# Patient Record
Sex: Male | Born: 1970
Health system: Southern US, Community
[De-identification: ages and names within clinical notes are randomized; demographics above are authoritative.]

## PROBLEM LIST (undated history)

## (undated) ENCOUNTER — Emergency Department (HOSPITAL_COMMUNITY): Admission: EM | Source: Home / Self Care

## (undated) DIAGNOSIS — G8194 Hemiplegia, unspecified affecting left nondominant side: Secondary | ICD-10-CM

## (undated) DIAGNOSIS — F329 Major depressive disorder, single episode, unspecified: Secondary | ICD-10-CM

## (undated) DIAGNOSIS — G2581 Restless legs syndrome: Secondary | ICD-10-CM

## (undated) DIAGNOSIS — F319 Bipolar disorder, unspecified: Secondary | ICD-10-CM

## (undated) DIAGNOSIS — D649 Anemia, unspecified: Secondary | ICD-10-CM

## (undated) DIAGNOSIS — F419 Anxiety disorder, unspecified: Secondary | ICD-10-CM

## (undated) DIAGNOSIS — F39 Unspecified mood [affective] disorder: Secondary | ICD-10-CM

## (undated) DIAGNOSIS — F32A Depression, unspecified: Secondary | ICD-10-CM

## (undated) DIAGNOSIS — G4730 Sleep apnea, unspecified: Secondary | ICD-10-CM

## (undated) DIAGNOSIS — E785 Hyperlipidemia, unspecified: Secondary | ICD-10-CM

## (undated) DIAGNOSIS — G8929 Other chronic pain: Secondary | ICD-10-CM

## (undated) DIAGNOSIS — I639 Cerebral infarction, unspecified: Secondary | ICD-10-CM

## (undated) DIAGNOSIS — F609 Personality disorder, unspecified: Secondary | ICD-10-CM

## (undated) DIAGNOSIS — M549 Dorsalgia, unspecified: Secondary | ICD-10-CM

## (undated) HISTORY — PX: BACK SURGERY: SHX140

---

## 2008-12-20 ENCOUNTER — Emergency Department: Payer: Self-pay | Admitting: Emergency Medicine

## 2010-05-14 ENCOUNTER — Emergency Department: Payer: Self-pay | Admitting: Emergency Medicine

## 2010-05-20 ENCOUNTER — Emergency Department: Payer: Self-pay | Admitting: Unknown Physician Specialty

## 2010-07-13 ENCOUNTER — Emergency Department: Payer: Self-pay | Admitting: Emergency Medicine

## 2010-07-14 ENCOUNTER — Emergency Department: Payer: Self-pay | Admitting: Unknown Physician Specialty

## 2010-09-14 ENCOUNTER — Encounter
Admission: RE | Admit: 2010-09-14 | Discharge: 2010-09-14 | Payer: Self-pay | Source: Home / Self Care | Attending: Physical Medicine & Rehabilitation | Admitting: Physical Medicine & Rehabilitation

## 2010-09-21 ENCOUNTER — Encounter: Payer: Self-pay | Admitting: Family Medicine

## 2010-09-25 ENCOUNTER — Encounter: Payer: Self-pay | Admitting: Family Medicine

## 2010-10-20 ENCOUNTER — Ambulatory Visit: Payer: Self-pay | Admitting: Family Medicine

## 2010-12-02 ENCOUNTER — Emergency Department: Payer: Self-pay | Admitting: Emergency Medicine

## 2010-12-15 ENCOUNTER — Emergency Department: Payer: Self-pay | Admitting: Emergency Medicine

## 2010-12-17 ENCOUNTER — Emergency Department: Payer: Self-pay | Admitting: Emergency Medicine

## 2010-12-21 ENCOUNTER — Emergency Department: Payer: Self-pay | Admitting: Emergency Medicine

## 2010-12-28 ENCOUNTER — Encounter: Payer: Self-pay | Admitting: Family Medicine

## 2010-12-28 ENCOUNTER — Ambulatory Visit
Admission: RE | Admit: 2010-12-28 | Discharge: 2010-12-28 | Payer: Self-pay | Source: Home / Self Care | Attending: Family Medicine | Admitting: Family Medicine

## 2010-12-28 ENCOUNTER — Emergency Department: Payer: Self-pay | Admitting: Emergency Medicine

## 2010-12-28 DIAGNOSIS — M545 Low back pain, unspecified: Secondary | ICD-10-CM | POA: Insufficient documentation

## 2010-12-28 DIAGNOSIS — N39498 Other specified urinary incontinence: Secondary | ICD-10-CM | POA: Insufficient documentation

## 2010-12-28 DIAGNOSIS — G8929 Other chronic pain: Secondary | ICD-10-CM | POA: Insufficient documentation

## 2010-12-28 DIAGNOSIS — I10 Essential (primary) hypertension: Secondary | ICD-10-CM | POA: Insufficient documentation

## 2011-01-01 ENCOUNTER — Encounter: Payer: Self-pay | Admitting: Family Medicine

## 2011-01-01 ENCOUNTER — Ambulatory Visit
Admission: RE | Admit: 2011-01-01 | Discharge: 2011-01-01 | Payer: Self-pay | Source: Home / Self Care | Attending: Family Medicine | Admitting: Family Medicine

## 2011-01-01 DIAGNOSIS — T887XXA Unspecified adverse effect of drug or medicament, initial encounter: Secondary | ICD-10-CM | POA: Insufficient documentation

## 2011-01-02 ENCOUNTER — Encounter: Payer: Self-pay | Admitting: Family Medicine

## 2011-01-07 ENCOUNTER — Ambulatory Visit
Admission: RE | Admit: 2011-01-07 | Discharge: 2011-01-07 | Payer: Self-pay | Source: Home / Self Care | Attending: Internal Medicine | Admitting: Internal Medicine

## 2011-01-07 DIAGNOSIS — M549 Dorsalgia, unspecified: Secondary | ICD-10-CM | POA: Insufficient documentation

## 2011-01-12 ENCOUNTER — Telehealth: Payer: Self-pay | Admitting: Family Medicine

## 2011-01-12 ENCOUNTER — Ambulatory Visit
Admission: RE | Admit: 2011-01-12 | Discharge: 2011-01-12 | Payer: Self-pay | Source: Home / Self Care | Attending: Family Medicine | Admitting: Family Medicine

## 2011-01-17 ENCOUNTER — Emergency Department: Payer: Self-pay | Admitting: Emergency Medicine

## 2011-01-27 ENCOUNTER — Emergency Department: Payer: Self-pay | Admitting: Emergency Medicine

## 2011-01-27 NOTE — Progress Notes (Signed)
Summary: Emergency Medical records  Emergency Medical records   Imported By: Dorna Leitz 01/02/2011 16:46:44  _____________________________________________________________________  External Attachment:    Type:   Image     Comment:   External Document

## 2011-01-27 NOTE — Assessment & Plan Note (Signed)
Summary: BACK PAIN/JBB   Vital Signs:  Patient profile:   40 year old male Height:      68 inches Weight:      208 pounds BMI:     31.74 O2 Sat:      97 % on Room air Temp:     98.2 degrees F oral Pulse rate:   105 / minute Pulse rhythm:   regular Resp:     20 per minute BP sitting:   125 / 87  (right arm)  Vitals Entered By: Levonne Spiller EMT-P (January 07, 2011 3:49 PM) CC: Lower Back Pain persisting since last visit last week Is Patient Diabetic? No Pain Assessment Patient in pain? yes     Location: lower back Intensity: 6 Type: aching Onset of pain  Gradual  Does patient need assistance? Functional Status Self care Ambulation Normal Comments Pt. is a smoker. 1 half pack per day.   CC:  Lower Back Pain persisting since last visit last week.  History of Present Illness: patient can't read and history form is completed by office staff. patient has been taking double doses of vicodin 7.5/750 and has 14 pills left from 40 prescribed last week. old chart reviewed inc MRI of 12/28/10 which showed neg acute abnl since 10/20/10 which showed post op changes of L5/s1 fusion and no disk bulging or stenosis. patient has appt with ortho on 1/18. patient's pain is unchanged and is left lumbar which occas radiates in sciatic dist. He is helped by laying in left fetal position. His leg occas gives out but is more of a pain release situation. Overall he is no worse but is getting frustrated by lack of good pain control. patient reports either jitters or stomach discomfort from asa or ibuprofen and thinks he took some arthritis med without problem but couldn't remember the name. patient denies ulcers or anaphylaxix  Problems Prior to Update: 1)  Adverse Drug Reaction  (ICD-995.20) 2)  Low Back Pain Syndrome, Severe  (ICD-724.2) 3)  Unspecified Essential Hypertension  (ICD-401.9) 4)  Other Urinary Incontinence  (ICD-788.39)  Medications Prior to Update: 1)  Vicodin 5-500 Mg Tabs  (Hydrocodone-Acetaminophen) .[name].. 1 Tab As Needed For Pain 2)  Lisinopril 40 Mg Tabs (Lisinopril) .[name].. 1 Tab Per Day 3)  Lyrica 200 Mg Caps (Pregabalin) .[name].. 1 Tab Twice Per Day 4)  Vicodin Es 7.5-750 Mg Tabs (Hydrocodone-Acetaminophen) .... Take 1 By Mouth Every 6 Hours As Needed Severe Lower Back Pain.  Caution Will Cause Drowsiness  Allergies: 1)  ! Ibuprofen 2)  ! * Shell Fish 3)  ! * Asprin  Past History:  Past Medical History: Last updated: 12/28/2010 Chronic Low Back Pain - on disability Hypertension Chronic Active Nicotine Dependence  Past Surgical History: Last updated: 12/28/2010 Pt reports 2  prior lower back surgeries "shaved disc" procedures 06 and 07  Social History: Last updated: 12/28/2010 Pt married, currently undergoing a divorce; lives in [place]; smokes 1/2 ppd cigs and has smoked for 6 years. Denies ETOH use and Recreational Drug use.  Review of Systems General:  Denies chills, fatigue, fever, loss of appetite, malaise, sleep disorder, sweats, weakness, and weight loss. Eyes:  Denies blurring, discharge, double vision, eye irritation, eye pain, halos, itching, light sensitivity, red eye, vision loss-1 eye, and vision loss-both eyes. ENT:  Denies decreased hearing, difficulty swallowing, ear discharge, earache, hoarseness, nasal congestion, nosebleeds, postnasal drainage, ringing in ears, sinus pressure, and sore throat. CV:  Denies bluish discoloration of lips or nails, chest  pain or discomfort, difficulty breathing at night, difficulty breathing while lying down, fainting, fatigue, leg cramps with exertion, lightheadness, near fainting, palpitations, shortness of breath with exertion, swelling of feet, swelling of hands, and weight gain. Resp:  Denies chest discomfort, chest pain with inspiration, cough, coughing up blood, excessive snoring, hypersomnolence, morning headaches, pleuritic, shortness of breath, sputum productive, and wheezing. GI:  Denies abdominal  pain, bloody stools, change in bowel habits, constipation, dark tarry stools, diarrhea, excessive appetite, gas, hemorrhoids, indigestion, loss of appetite, nausea, vomiting, vomiting blood, and yellowish skin color. GU:  Denies decreased libido, discharge, dysuria, erectile dysfunction, genital sores, hematuria, incontinence, nocturia, urinary frequency, and urinary hesitancy. MS:  Complains of joint pain and low back pain; denies joint redness, joint swelling, loss of strength, mid back pain, muscle aches, muscle , cramps, muscle weakness, stiffness, and thoracic pain; Lower back. Pt. has an appt with [facility] on 01-12-2011.[name] Derm:  Denies changes in color of skin, changes in nail beds, dryness, excessive perspiration, flushing, hair loss, insect bite(s), itching, lesion(s), poor wound healing, and rash. Neuro:  Denies brief paralysis, difficulty with concentration, disturbances in coordination, falling down, headaches, inability to speak, memory loss, numbness, poor balance, seizures, sensation of room spinning, tingling, tremors, visual disturbances, and weakness. Psych:  Denies alternate hallucination ( auditory/visual), anxiety, depression, easily angered, easily tearful, irritability, mental problems, panic attacks, sense of great danger, suicidal thoughts/plans, thoughts of violence, unusual visions or sounds, and thoughts /plans of harming others. Endo:  Denies cold intolerance, excessive hunger, excessive thirst, excessive urination, heat intolerance, polyuria, and weight change. Heme:  Denies abnormal bruising, bleeding, enlarge lymph nodes, fevers, pallor, and skin discoloration. Allergy:  Complains of seasonal allergies; denies hives or rash, itching eyes, persistent infections, and sneezing.  Physical Exam  General:  Well-developed,well-nourished,in no acute distress; alert,appropriate and cooperative throughout examination Abdomen:  abdomen soft and non-tender. Msk:  lumbar surg  scar. decreased ROM and SI joint tenderness.   Extremities:  tender left medial knee. from. Neurologic:   Station and gait are normal. Plantar reflexes are down-going bilaterally. DTRs are symmetrical kj and right aj and absent left aj. pt reports that this is chronic. Sensory, motor and coordinative functions appear intact. Skin:  Intact without suspicious rashes Psych:  depressed affect and slightly anxious.     Problems:  Medical Problems Added: 1)  Dx of Back Pain  (ICD-724.5)  Complete Medication List: 1)  Vicodin 5-500 Mg Tabs (Hydrocodone-acetaminophen) .[name].. 1 tab as needed for pain 2)  Lisinopril 40 Mg Tabs (Lisinopril) .[name].. 1 tab per day 3)  Lyrica 200 Mg Caps (Pregabalin) .[name].. 1 tab twice per day 4)  Vicodin Es 7.5-750 Mg Tabs (Hydrocodone-acetaminophen) .... Take 1 by mouth every 6 hours as needed severe lower back pain.  caution will cause drowsiness 5)  Mobic 15 Mg Tabs (Meloxicam) .[name].. 1 by mouth once daily with food  Patient Instructions: 1)  watch for gi side effects of mobic and do not take more than 1 per day. 2)  reduce narcotics to 1 pill up to 5 times daily. 3)   warned of tylenol risks on liver. 4)  rest in comfort position as much as possible. 5)  keep appt on 1/18 and accept referral to pain specialist if offered. 6)  go to ed if sudden persistent weakness or numbness in legs. Prescriptions: MOBIC 15 MG TABS (MELOXICAM) 1 by mouth once daily with food  #30 x 0   Entered and Authorized by:   J.  Juline Patch MD   Signed by:   Shela Commons. Juline Patch MD on 01/07/2011   Method used:   Electronically to        [facility]  [address] (retail)       [address], [address]       [place], [place]  [address]       Ph: [phone]       Fax: [phone]   RxID:   [id number] VICODIN ES 7.5-750 MG TABS (HYDROCODONE-ACETAMINOPHEN) take 1 by mouth every 6 hours as needed severe lower back pain.  Caution will cause drowsiness  #15 x 0    Entered and Authorized by:   J. Juline Patch MD   Signed by:   Shela Commons. Juline Patch MD on 01/07/2011   Method used:   Print then Give to Patient   RxID:   [id number]

## 2011-01-27 NOTE — Assessment & Plan Note (Signed)
Summary: wants script for pain med for back pain/jbb   History of Present Illness: CLINICAL NURSE MANAGER ENCOUNTER DOCUMENTATION  Pt. arrived at the clinic reporting that he was going out of town and needed new vicodin refills.  He had just been given Vicodin refills from this office last week.  Pt. had been seen in this clinic prior 3 other visits. The patient said that his orthopedist sent him here to get his Vicodin prescriptions here.   Pt. was given an appt. with [facility] for consult and management of his lower back pain. We called to [facility] to get records of the visit and learned that the patient was not told to come here for pain management.  We learned, in fact, that the patient was told clearly at that visit that he was best served by taking the steroid dosepak prescribed and following up with neurosurgery and also a pain management referral had been arranged for him.  They actually said that they felt that the patient was not following their instructions by coming here to the clinic today asking for pain medications when they had told him not to do so.  The pt. was advised that we can no longer refill narcotic, controlled pain medications because he had been untruthful to [place] and not following the specific clear directions of his orthopedist.  We told him that he would need to follow up with his orthopedist and pain management specialists like they had advised him.  The patient verbalized understanding and then walked out of the clinic without further evaluation. The clinic physician was informed. / rwt  Allergies: 1)  ! Ibuprofen 2)  ! * Shell Fish 3)  ! * Asprin   Complete Medication List: 1)  Vicodin 5-500 Mg Tabs (Hydrocodone-acetaminophen) .[name].. 1 tab as needed for pain 2)  Lisinopril 40 Mg Tabs (Lisinopril) .[name].. 1 tab per day 3)  Lyrica 200 Mg Caps (Pregabalin) .[name].. 1 tab twice per day 4)  Mobic 15 Mg Tabs (Meloxicam) .[name].. 1 by mouth once daily with food

## 2011-01-27 NOTE — Letter (Signed)
Summary: Medical release form  Medical release form   Imported By: Dorna Leitz 01/02/2011 16:24:59  _____________________________________________________________________  External Attachment:    Type:   Image     Comment:   External Document

## 2011-01-27 NOTE — Progress Notes (Signed)
  Phone Note From Other Clinic   Caller: Provider Call For: Rodney Langton, MD CDE FAAFP Request: Talk with Provider Action Taken: Phone Call Completed Summary of Call: I spoke with the patient's orthopedics physician from [facility] and he told me that he had not told the patient to come back to this office for more pain prescriptions. In fact, he did the opposite.  He had told the patient that he would be best served by treating inflammation aggressively with a steroid taper pack and he had arranged for the patient to be evaluated by a neurosurgeon and a pain management specialist.  I got the impression from the physician that he had told the patient that he was not best served by taking chronic controlled narcotics and should have an evauation by a neurosurgeon and then having a complete pain management evaluation by the specialty team.  He said that he believed that the patient was exhibiting drug seeking behaviors and had lied to [place] by telling us that he had sent the patient here for more pain medications.  Rodney Langton, MD, CDE, FAAFP  Initial call taken by: Jonetta Speak    Addendum:  The patient walked out of the clinic when we informed him that we had inquired about his orthopedic appointment and wanted to get more details of the circumstances surrounding that office visit and why he had presented back here so soon asking for more controlled pain medication prescriptions.

## 2011-01-27 NOTE — Assessment & Plan Note (Signed)
Summary: real bad back pain, leg locking up/jbb   Vital Signs:  Patient Profile:   40 Years Old Male CC:      Lower Back Pain Height:     68 inches Weight:      205 pounds BMI:     31.28 O2 Sat:      96 % O2 treatment:    Room Air Temp:     97.7 degrees F oral Pulse rate:   85 / minute Pulse rhythm:   regular Resp:     20 per minute BP sitting:   143 / 97  (right arm)  Pt. in pain?   yes    Location:   back    Intensity:   10    Type:       aching  Vitals Entered By: Levonne Spiller EMT-P (December 28, 2010 11:19 AM)              Is Patient Diabetic? No  Does patient need assistance? Functional Status Self care Ambulation Normal Comments Pt. is a smoker. Half pack per day      Current Allergies: ! IBUPROFEN ! * SHELL FISH ! * ASPRINHistory of Present Illness History from: patient Reason for visit: medication refills Chief Complaint: Lower Back Pain History of Present Illness: The patient is presenting today because he is having worsening lower back pain and now for the past week has started to develop urinary incontinence and loss of bladder control.  He is also having some difficulty with bowels.  He says that he is visiting this area to be with his parents while going through a divorce.  He says that he has a long history of chronic lower back problems and has had multiple surgeries on his lower back and he is reporting that he has not been able to see his neurosurgeon in the past 3 years.  His last back surgery was in 2007 and last saw the neurosurgeon in 2008.  He says that he is having difficulty walking and he is reporting that his lower back pain is 10/10.  He says that he is out of all of his medications including the chronic pain medication that he takes and his blood pressure medications.  He says that he otherwise has been miserable and suffering.  He says that his shorts are constantly wet because he is dribbling urine all day long.  He has also been falling down  frequently and reports that he had a severe fall in November of 2011.    REVIEW OF SYSTEMS Constitutional Symptoms       Complains of chills.     Denies fever, night sweats, weight loss, weight gain, and fatigue.  Eyes       Denies change in vision, eye pain, eye discharge, glasses, contact lenses, and eye surgery. Ear/Nose/Throat/Mouth       Denies hearing loss/aids, change in hearing, ear pain, ear discharge, dizziness, frequent runny nose, frequent nose bleeds, sinus problems, sore throat, hoarseness, and tooth pain or bleeding.  Respiratory       Denies dry cough, productive cough, wheezing, shortness of breath, asthma, bronchitis, and emphysema/COPD.  Cardiovascular       Denies murmurs, chest pain, and tires easily with exhertion.    Gastrointestinal       Denies stomach pain, nausea/vomiting, diarrhea, constipation, blood in bowel movements, and indigestion. Genitourniary       Complains of loss of urinary control.      Denies painful  urination and kidney stones.      Comments: in the past week per pt. Neurological       Complains of headaches.      Denies paralysis, seizures, and fainting/blackouts. Musculoskeletal       Complains of muscle pain, joint pain, and joint stiffness.      Denies decreased range of motion, redness, swelling, muscle weakness, and gout.  Skin       Denies bruising, unusual mles/lumps or sores, and hair/skin or nail changes.  Psych       Denies mood changes, temper/anger issues, anxiety/stress, speech problems, depression, and sleep problems.  Past History:  Family History: Last updated: 12/28/2010 Parents reported as healthy by patient  Social History: Last updated: 12/28/2010 Pt married, currently undergoing a divorce; lives in [place]; smokes 1/2 ppd cigs and has smoked for 6 years. Denies ETOH use and Recreational Drug use.  Past Medical History: Chronic Low Back Pain - on disability Hypertension Chronic Active Nicotine  Dependence  Past Surgical History: Pt reports 2  prior lower back surgeries "shaved disc" procedures 06 and 07  Family History: Parents reported as healthy by patient  Social History: Pt married, currently undergoing a divorce; lives in [place]; smokes 1/2 ppd cigs and has smoked for 6 years. Denies ETOH use and Recreational Drug use. Physical Exam General appearance: well developed, well nourished, no acute distress Head: normocephalic, atraumatic Eyes: conjunctivae and lids normal Pupils: equal, round, reactive to light Ears: normal, no lesions or deformities Nasal: mucosa pink, nonedematous, no septal deviation, turbinates normal Oral/Pharynx: tongue normal, posterior pharynx without erythema or exudate Neck: neck supple,  trachea midline, no masses Chest/Lungs: no rales, wheezes, or rhonchi bilateral, breath sounds equal without effort Heart: regular rate and rhythm, no murmur Abdomen: soft, non-tender without obvious organomegaly GU: pt dribbling urine Extremities: normal extremities Neurological: grossly intact and non-focal Back: tender musculature bilateral lower back, straight leg raises positive bilaterally, deep tendon reflexes  0/4 on left, 2/4 right at achilles and patella Skin: no obvious rashes or lesions MSE: oriented to time, place, and person Assessment New Problems: LOW BACK PAIN SYNDROME, SEVERE (ICD-724.2) UNSPECIFIED ESSENTIAL HYPERTENSION (ICD-401.9) OTHER URINARY INCONTINENCE (ICD-788.39)   Patient Education: Patient and/or caregiver instructed in the following: rest, quit smoking. The risks, benefits and possible side effects were clearly explained and discussed with the patient.  The patient verbalized clear understanding.  The patient was given instructions to return if symptoms don't improve, worsen or new changes develop.  If it is not during clinic hours and the patient cannot get back to this clinic then the patient was told to seek medical care  at an available urgent care or emergency department.  The patient verbalized understanding.   Demonstrates willingness to comply.  Plan Planning Comments:   Because the patient is having an acute neurological change to his condition, and he is reporting 10/10 pain in the L-S spine, I recommended that he go to the ER for evaluation and treatment.  We called EMS to come and pick the patient up and to take him to [facility] for an evaluation.  I also spoke with the patient's mother on the telephone and updated her on his condition and that we were sending him to the ER for evaluation. She verbalized understanding.  Follow Up: Follow up on an as needed basis, Follow up with Primary Physician  The patient and/or caregiver has been counseled thoroughly with regard to medications prescribed including dosage, schedule, interactions, rationale for use,  and possible side effects and they verbalize understanding.  Diagnoses and expected course of recovery discussed and will return if not improved as expected or if the condition worsens. Patient and/or caregiver verbalized understanding.   Patient Instructions: 1)  The patient was sent to the ER for evaluation and treatment of acute on chronic low back pain syndrome with acute neurological deterioration (given his urinary incontinence in the last week).

## 2011-01-27 NOTE — Assessment & Plan Note (Signed)
Summary: BACK PAIN/EVM   Vital Signs:  Patient profile:   40 year old male Height:      68 inches Weight:      208 pounds BMI:     31.74 O2 Sat:      95 % on Room air Temp:     98.2 degrees F Pulse rate:   85 / minute BP sitting:   143 / 99  (left arm) Cuff size:   regular  Vitals Entered By: Haze Boyden, CMA (January 01, 2011 2:42 PM)  O2 Flow:  Room air History of Present Illness History from: patient Reason for visit: see chief complaint Chief Complaint: reaction to percocet History of Present Illness: The patient presented today as a follow up from the ER. He was given a prescription for percocet.  He said that it caused him to have itching and a rash.  Pt says that he became sick to his stomach.  He denies n/v.  No chest pain, no SOB.  Pt says that he would like to switch to his vicodin.  The patient says that his symptoms of urinary incontinence have persisted.  The patient is reporting that he is doing a lot better.  He says that he is taking his lyrica and lisinopril medication.  I reviewed the patients ER records from 12/28/10 and reviewed the MRI results.      Allergies: 1)  ! Ibuprofen 2)  ! * Shell Fish 3)  ! * Asprin  Past History:  Family History: Last updated: 12/28/2010 Parents reported as healthy by patient  Social History: Last updated: 12/28/2010 Pt married, currently undergoing a divorce; lives in [place]; smokes 1/2 ppd cigs and has smoked for 6 years. Denies ETOH use and Recreational Drug use.  Past Medical History: Reviewed history from 12/28/2010 and no changes required. Chronic Low Back Pain - on disability Hypertension Chronic Active Nicotine Dependence  Past Surgical History: Reviewed history from 12/28/2010 and no changes required. Pt reports 2  prior lower back surgeries "shaved disc" procedures 06 and 07  Family History: Reviewed history from 12/28/2010 and no changes required. Parents reported as healthy by patient  Social  History: Reviewed history from 12/28/2010 and no changes required. Pt married, currently undergoing a divorce; lives in [place]; smokes 1/2 ppd cigs and has smoked for 6 years. Denies ETOH use and Recreational Drug use.  Review of Systems      See HPI General:  Denies chills, fatigue, fever, loss of appetite, malaise, sleep disorder, sweats, weakness, and weight loss. Eyes:  Denies blurring, discharge, double vision, eye irritation, eye pain, halos, itching, light sensitivity, red eye, vision loss-1 eye, and vision loss-both eyes. ENT:  Complains of nasal congestion; denies nosebleeds and postnasal drainage. CV:  Denies bluish discoloration of lips or nails, chest pain or discomfort, difficulty breathing at night, difficulty breathing while lying down, fainting, fatigue, leg cramps with exertion, lightheadness, near fainting, palpitations, shortness of breath with exertion, swelling of feet, swelling of hands, and weight gain. Resp:  Denies chest discomfort, chest pain with inspiration, cough, coughing up blood, excessive snoring, hypersomnolence, morning headaches, pleuritic, shortness of breath, sputum productive, and wheezing. GI:  Complains of change in bowel habits and constipation. MS:  Complains of low back pain, mid back pain, muscle weakness, stiffness, and thoracic pain; denies joint pain, joint redness, joint swelling, loss of strength, muscle aches, muscle , and cramps. Neuro:  Complains of numbness, tingling, and weakness. Psych:  Denies alternate hallucination ( auditory/visual), anxiety,  depression, easily angered, easily tearful, irritability, mental problems, panic attacks, sense of great danger, suicidal thoughts/plans, thoughts of violence, unusual visions or sounds, and thoughts /plans of harming others.  Physical Exam  General:  Well-developed,well-nourished,in no acute distress; alert,appropriate and cooperative throughout examination Head:  Normocephalic and atraumatic  without obvious abnormalities. No apparent alopecia or balding. Eyes:  No corneal or conjunctival inflammation noted. EOMI. Perrla. Funduscopic exam benign, without hemorrhages, exudates or papilledema. Vision grossly normal. Ears:  External ear exam shows no significant lesions or deformities.  Otoscopic examination reveals clear canals, tympanic membranes are intact bilaterally without bulging, retraction, inflammation or discharge. Hearing is grossly normal bilaterally. Nose:  bilateral swollen nasal turbinates Mouth:  Oral mucosa and oropharynx without lesions or exudates.  Teeth in good repair. Neck:  No deformities, masses, or tenderness noted. Lungs:  Normal respiratory effort, chest expands symmetrically. Lungs are clear to auscultation, no crackles or wheezes. Heart:  Normal rate and regular rhythm. S1 and S2 normal without gallop, murmur, click, rub or other extra sounds. Msk:  tenderness in the lower lumbar spine and paraspinal muscles in spasm Pulses:  R and L carotid,radial,femoral,dorsalis pedis and posterior tibial pulses are full and equal bilaterally Neurologic:  RLE hyporeflexia and LLE hyporeflexia.     Impression & Recommendations:  Problem # 1:  ADVERSE DRUG REACTION (ICD-995.20) Pt told to discontinue the percocet tablets and I will give him an Rx for vicodin ES, #40, NRF.   The patient has an appointment arranged with a spinal specialist on 1/18.  He was advised to go to that appointment.  Avoid ETOH.  I explained to him that he would need to find a pain management specialist and primary care provider ASAP.  The patient verbalized clear understanding.    Problem # 2:  UNSPECIFIED ESSENTIAL HYPERTENSION (ICD-401.9)  His updated medication list for this problem includes:    Lisinopril 40 Mg Tabs (Lisinopril) .[name]... 1 tab per day  Problem # 3:  LOW BACK PAIN SYNDROME, SEVERE (ICD-724.2)  His updated medication list for this problem includes:    Vicodin Es 7.5-750 Mg Tabs  (Hydrocodone-acetaminophen) .[name]... Take 1 by mouth every 6 hours as needed severe lower back pain.  caution will cause drowsiness  Complete Medication List: 1)  Vicodin 5-500 Mg Tabs (Hydrocodone-acetaminophen) .[name].. 1 tab as needed for pain 2)  Lisinopril 40 Mg Tabs (Lisinopril) .[name].. 1 tab per day 3)  Lyrica 200 Mg Caps (Pregabalin) .[name].. 1 tab twice per day 4)  Vicodin Es 7.5-750 Mg Tabs (Hydrocodone-acetaminophen) .... Take 1 by mouth every 6 hours as needed severe lower back pain.  caution will cause drowsiness  Patient Instructions: 1)  Go to the pharmacy and pick up your prescription (s).  It may take up to 30 mins for electronic prescriptions to be delivered to the pharmacy.  Please call if your pharmacy has not received your prescriptions after 30 minutes.   2)  Be careful and try not to fall down.  3)  Don't miss your appointment with the spinal specialist. 4)  Check your Blood Pressure regularly. If it is above: 140/90 you should make an appointment. 5)  The patient was informed that there is no on-call provider or services available at this clinic during off-hours (when the clinic is closed).  If the patient developed a problem or concern that required immediate attention, the patient was advised to go the the nearest available urgent care or emergency department for medical care.  The patient verbalized understanding.  6)  Eat a high fiber diet to avoid constipation.  7)  We will need to get your records from [place].  Prescriptions: VICODIN ES 7.5-750 MG TABS (HYDROCODONE-ACETAMINOPHEN) take 1 by mouth every 6 hours as needed severe lower back pain.  Caution will cause drowsiness  #40 x 0   Entered and Authorized by:   Standley Dakins MD   Signed by:   Standley Dakins MD on 01/01/2011   Method used:   Print then Give to Patient   RxID:   [id number]

## 2011-02-01 ENCOUNTER — Emergency Department: Payer: Self-pay | Admitting: Emergency Medicine

## 2011-02-14 ENCOUNTER — Emergency Department: Payer: Self-pay | Admitting: Unknown Physician Specialty

## 2011-02-16 ENCOUNTER — Emergency Department: Payer: Self-pay | Admitting: Unknown Physician Specialty

## 2011-03-22 ENCOUNTER — Emergency Department: Payer: Self-pay | Admitting: Emergency Medicine

## 2011-04-01 ENCOUNTER — Emergency Department: Payer: Self-pay | Admitting: Emergency Medicine

## 2011-04-15 ENCOUNTER — Ambulatory Visit: Payer: Self-pay | Admitting: Family Medicine

## 2011-04-19 ENCOUNTER — Ambulatory Visit: Payer: Self-pay | Admitting: Emergency Medicine

## 2011-05-06 ENCOUNTER — Emergency Department: Payer: Self-pay | Admitting: Emergency Medicine

## 2011-05-09 ENCOUNTER — Emergency Department: Payer: Self-pay | Admitting: Emergency Medicine

## 2011-05-18 ENCOUNTER — Emergency Department: Payer: Self-pay | Admitting: *Deleted

## 2011-05-18 ENCOUNTER — Emergency Department: Payer: Self-pay | Admitting: Emergency Medicine

## 2011-05-24 ENCOUNTER — Emergency Department: Payer: Self-pay | Admitting: Emergency Medicine

## 2011-06-18 ENCOUNTER — Emergency Department: Payer: Self-pay | Admitting: Internal Medicine

## 2011-06-22 ENCOUNTER — Emergency Department: Payer: Self-pay | Admitting: Unknown Physician Specialty

## 2011-07-04 ENCOUNTER — Emergency Department: Payer: Self-pay | Admitting: Emergency Medicine

## 2011-07-10 ENCOUNTER — Emergency Department: Payer: Self-pay | Admitting: Emergency Medicine

## 2011-07-11 ENCOUNTER — Emergency Department: Payer: Self-pay | Admitting: Emergency Medicine

## 2011-07-21 ENCOUNTER — Emergency Department: Payer: Self-pay | Admitting: Emergency Medicine

## 2011-07-26 ENCOUNTER — Emergency Department: Payer: Self-pay | Admitting: Internal Medicine

## 2011-08-10 ENCOUNTER — Emergency Department: Payer: Self-pay | Admitting: *Deleted

## 2011-09-05 ENCOUNTER — Emergency Department: Payer: Self-pay | Admitting: Emergency Medicine

## 2011-09-23 ENCOUNTER — Emergency Department: Payer: Self-pay | Admitting: Emergency Medicine

## 2011-09-24 ENCOUNTER — Emergency Department: Payer: Self-pay | Admitting: *Deleted

## 2011-10-01 ENCOUNTER — Emergency Department: Payer: Self-pay | Admitting: Unknown Physician Specialty

## 2011-10-05 ENCOUNTER — Emergency Department: Payer: Self-pay | Admitting: Internal Medicine

## 2011-10-09 ENCOUNTER — Emergency Department: Payer: Self-pay | Admitting: Emergency Medicine

## 2011-10-13 ENCOUNTER — Emergency Department (HOSPITAL_COMMUNITY)
Admission: EM | Admit: 2011-10-13 | Discharge: 2011-10-13 | Discharge status: Against Medical Advice | Payer: Self-pay | Attending: Emergency Medicine | Admitting: Emergency Medicine

## 2011-10-21 ENCOUNTER — Emergency Department (HOSPITAL_COMMUNITY)
Admission: EM | Admit: 2011-10-21 | Discharge: 2011-10-21 | Disposition: A | Discharge status: Home / Self Care | Payer: Medicare Other | Attending: Emergency Medicine | Admitting: Emergency Medicine

## 2011-10-21 DIAGNOSIS — I10 Essential (primary) hypertension: Secondary | ICD-10-CM | POA: Insufficient documentation

## 2011-10-21 DIAGNOSIS — G8929 Other chronic pain: Secondary | ICD-10-CM | POA: Insufficient documentation

## 2011-10-21 DIAGNOSIS — M549 Dorsalgia, unspecified: Secondary | ICD-10-CM | POA: Insufficient documentation

## 2011-10-31 ENCOUNTER — Emergency Department (HOSPITAL_COMMUNITY)
Admission: EM | Admit: 2011-10-31 | Discharge: 2011-10-31 | Disposition: A | Discharge status: Home / Self Care | Payer: Medicare Other | Attending: Emergency Medicine | Admitting: Emergency Medicine

## 2011-10-31 ENCOUNTER — Encounter: Payer: Self-pay | Admitting: Emergency Medicine

## 2011-10-31 DIAGNOSIS — G8929 Other chronic pain: Secondary | ICD-10-CM

## 2011-10-31 DIAGNOSIS — R29898 Other symptoms and signs involving the musculoskeletal system: Secondary | ICD-10-CM | POA: Insufficient documentation

## 2011-10-31 DIAGNOSIS — I10 Essential (primary) hypertension: Secondary | ICD-10-CM | POA: Insufficient documentation

## 2011-10-31 DIAGNOSIS — R209 Unspecified disturbances of skin sensation: Secondary | ICD-10-CM | POA: Insufficient documentation

## 2011-10-31 DIAGNOSIS — M545 Low back pain, unspecified: Secondary | ICD-10-CM | POA: Insufficient documentation

## 2011-10-31 DIAGNOSIS — M79609 Pain in unspecified limb: Secondary | ICD-10-CM | POA: Insufficient documentation

## 2011-10-31 MED ORDER — METHOCARBAMOL 100 MG/ML IJ SOLN: 1000.0000 mg | Freq: Once | INTRAMUSCULAR | Status: DC

## 2011-10-31 MED ORDER — HYDROMORPHONE HCL PF 2 MG/ML IJ SOLN: 2.0000 mg | Freq: Once | INTRAMUSCULAR | Status: DC

## 2011-10-31 MED ORDER — HYDROCODONE-ACETAMINOPHEN 5-325 MG PO TABS: 2.0000 | ORAL_TABLET | ORAL | Status: AC | PRN

## 2011-10-31 MED ORDER — HYDROMORPHONE HCL PF 2 MG/ML IJ SOLN
2.0000 mg | Freq: Once | INTRAMUSCULAR | Status: AC
  Administered 2011-10-31: 2 mg via INTRAMUSCULAR
  Filled 2011-10-31: qty 1

## 2011-10-31 MED ORDER — DIAZEPAM 5 MG/ML IJ SOLN
5.0000 mg | Freq: Once | INTRAMUSCULAR | Status: AC
  Administered 2011-10-31: 5 mg via INTRAMUSCULAR
  Filled 2011-10-31: qty 2

## 2011-10-31 NOTE — ED Notes (Signed)
Pt given D/C instructions; verbalizes understanding.  Pt with c/o pain ranging 6.5-7/10.  No other distress noted.  Pt ambulatory to lobby with significant other.

## 2011-10-31 NOTE — ED Provider Notes (Signed)
History     CSN: [id number] Arrival date & time: 10/31/2011  1:45 PM   First MD Initiated Contact with Patient 10/31/11 1423      Chief Complaint  Patient presents with  . Back Pain    (Consider location/radiation/quality/duration/timing/severity/associated sxs/prior treatment) HPI Comments: Patient has chronic low back pain with radiation into left leg.  Pt has been referred to pain management and has an appointment later this month.  States his chronic back pain is unchanged and feels exactly like his chronic back pain when he runs out of his medication.  In addition, patient was moving furniture yesterday and feels like this made his pain worse.  No change in his left leg symptoms.    Back Pain  This is a chronic problem. The problem occurs constantly. The problem has not changed since onset.The pain is associated with lifting heavy objects (chronic back pain, hx multiple L-spine surgeries). The pain radiates to the left foot. Associated symptoms include numbness, leg pain and weakness. Pertinent negatives include no fever, no abdominal pain, no bowel incontinence, no bladder incontinence, no dysuria and no paresthesias. Associated symptoms comments: Intermittent left leg weakness that is unchanged, pain radiates to left leg, this is unchanged.  No paresthesias of lower extremities.  .  Patient is a 40 y.o. male presenting with back pain.    Past Medical History  Diagnosis Date  . Hypertension     History reviewed. No pertinent past surgical history.  History reviewed. No pertinent family history.  History  Substance Use Topics  . Smoking status: Current Everyday Smoker -- 1.0 packs/day  . Smokeless tobacco: Not on file  . Alcohol Use: No      Review of Systems  Constitutional: Negative for fever.  Gastrointestinal: Negative for abdominal pain and bowel incontinence.  Genitourinary: Negative for bladder incontinence and dysuria.  Musculoskeletal: Positive for back pain.    Neurological: Positive for weakness and numbness. Negative for paresthesias.    Allergies  Aspirin; Ibuprofen; and Shellfish allergy  Home Medications  No current outpatient prescriptions on file.  BP 133/95  Pulse 77  Temp 98.2 F (36.8 C)  Resp 18  SpO2 97%  Physical Exam  Vitals reviewed. Constitutional: He is oriented to person, place, and time. He appears well-developed and well-nourished.  HENT:  Head: Normocephalic and atraumatic.  Neck: Neck supple.  Cardiovascular: Normal rate, regular rhythm and normal heart sounds.   Pulmonary/Chest: Breath sounds normal. No respiratory distress. He has no wheezes. He has no rales. He exhibits no tenderness.  Abdominal: Soft. Bowel sounds are normal. There is no tenderness.  Musculoskeletal:       c-spine, t-spine normal, nontender.  l-spine diffusely tender with no step-offs, localized tenderness, crepitus, or skin changes.   Neurological: He is alert and oriented to person, place, and time. He has normal strength. No sensory deficit.       Strength 5/5 lower extremities, sensation intact, distal pulses intact, no edema.     ED Course  Procedures (including critical care time) 4:28 PM Patient reports no relief of pain.  Lisette Paz, PA-C, assumes care of patient at change of shift.  Plan is for pain control and d/c home dx chronic back pain.  Pt to follow up with pain management.  May also give resource list for PCP follow up.   Labs Reviewed - No data to display No results found.   1. Back pain, chronic       MDM  Patient with chronic  low back pain that he states is unchanged.  Patient was writhing on stretcher, but states this is absolutely normal for him and that "rocking" is what he does when he is in pain.  Patient signed out to [facility] [place], who continued managing pain and dispositioned patient.          Dillard [place], [place] 11/02/11 [phone]

## 2011-10-31 NOTE — ED Notes (Signed)
Pt c/o lower back pain with radiation into hip and down left leg x 3 days; pt sts hx of same; pt sts happened after moving furniture

## 2011-10-31 NOTE — ED Provider Notes (Signed)
5:26 PM  Patient care resumed from [facility]. Patient is here for chronic pain management of lower back problems. Pain has been controlled with Dilaudid. He states he will followup on the 19th with chronic pain management. I will be giving him a prescription for pain killers to last him till that appointment.  [place], [place] 10/31/11 1726

## 2011-11-02 NOTE — ED Provider Notes (Signed)
Medical screening examination/treatment/procedure(s) were performed by non-physician practitioner and as supervising physician I was immediately available for consultation/collaboration.  Hurman Horn, MD 11/02/11 2209

## 2011-11-02 NOTE — ED Provider Notes (Signed)
Medical screening examination/treatment/procedure(s) were performed by non-physician practitioner and as supervising physician I was immediately available for consultation/collaboration.  Hurman Horn, MD 11/02/11 2225

## 2011-11-06 ENCOUNTER — Encounter (HOSPITAL_COMMUNITY): Payer: Self-pay | Admitting: *Deleted

## 2011-11-06 ENCOUNTER — Emergency Department (INDEPENDENT_AMBULATORY_CARE_PROVIDER_SITE_OTHER)
Admission: EM | Admit: 2011-11-06 | Discharge: 2011-11-06 | Disposition: A | Discharge status: Home / Self Care | Payer: Medicare Other | Source: Home / Self Care | Attending: Emergency Medicine | Admitting: Emergency Medicine

## 2011-11-06 DIAGNOSIS — G8929 Other chronic pain: Secondary | ICD-10-CM

## 2011-11-06 DIAGNOSIS — M549 Dorsalgia, unspecified: Secondary | ICD-10-CM

## 2011-11-06 DIAGNOSIS — J00 Acute nasopharyngitis [common cold]: Secondary | ICD-10-CM

## 2011-11-06 HISTORY — DX: Depression, unspecified: F32.A

## 2011-11-06 HISTORY — DX: Other chronic pain: G89.29

## 2011-11-06 HISTORY — DX: Dorsalgia, unspecified: M54.9

## 2011-11-06 HISTORY — DX: Major depressive disorder, single episode, unspecified: F32.9

## 2011-11-06 MED ORDER — LIDOCAINE 5 % EX PTCH: 1.0000 | MEDICATED_PATCH | CUTANEOUS | Status: AC

## 2011-11-06 MED ORDER — MUPIROCIN CALCIUM 2 % NA OINT: TOPICAL_OINTMENT | NASAL | Status: AC

## 2011-11-06 MED ORDER — DOXYCYCLINE HYCLATE 100 MG PO CAPS: 100.0000 mg | ORAL_CAPSULE | Freq: Two times a day (BID) | ORAL | Status: AC

## 2011-11-06 NOTE — ED Provider Notes (Addendum)
History     CSN: [id number] Arrival date & time: 11/06/2011  4:52 PM   First MD Initiated Contact with Patient 11/06/11 1613      Chief Complaint  Patient presents with  . URI  . Back Pain    (Consider location/radiation/quality/duration/timing/severity/associated sxs/prior treatment) HPI Comments: R side of nose its swollen and has been hurting for "severel days" its sore in the inside and now getting swollen on the outside"..had some cough last week with some congestion took mucinex that got better but the nose pain is getting worse"  The history is provided by the patient.    Past Medical History  Diagnosis Date  . Hypertension   . Chronic back pain   . Depression     Past Surgical History  Procedure Date  . Back surgery     History reviewed. No pertinent family history.  History  Substance Use Topics  . Smoking status: Current Everyday Smoker -- 0.5 packs/day    Types: Cigarettes  . Smokeless tobacco: Not on file  . Alcohol Use: No      Review of Systems  Constitutional: Negative.  Negative for fever.    Allergies  Aspirin; Ibuprofen; Iodine; and Shellfish allergy  Home Medications   Current Outpatient Rx  Name Route Sig Dispense Refill  . LISINOPRIL 40 MG PO TABS Oral Take 40 mg by mouth daily.      [name] PREGABALIN 200 MG PO CAPS Oral Take 200 mg by mouth 2 (two) times daily.      [name] ZOLOFT PO Oral Take by mouth.      . DOXYCYCLINE HYCLATE 100 MG PO CAPS Oral Take 1 capsule (100 mg total) by mouth 2 (two) times daily. 20 capsule 0  . HYDROCODONE-ACETAMINOPHEN 5-325 MG PO TABS Oral Take 2 tablets by mouth every 4 (four) hours as needed for pain. 20 tablet 0  . LIDOCAINE 5 % EX PTCH Transdermal Place 1 patch onto the skin daily. Remove & Discard patch within 12 hours or as directed by MD 15 patch 0  . MUPIROCIN CALCIUM 2 % NA OINT  Apply in each nostril daily twice a day for 7 days 1 g 0  . OXYCODONE-ACETAMINOPHEN 5-325 MG PO TABS Oral Take 1 tablet by  mouth every 4 (four) hours as needed. For pain        BP 134/88  Pulse 88  Temp(Src) 98.6 F (37 C) (Oral)  Resp 20  SpO2 97%  Physical Exam  Constitutional: He appears well-developed and well-nourished.  HENT:  Nose: Mucosal edema and sinus tenderness present.      ED Course  Procedures (including critical care time)  Labs Reviewed - No data to display No results found.   1. Acute infective rhinitis   2. BACK PAIN   3. Chronic back pain       MDM  nasal LOCALIZED INFECTION- R nostril and ongoing chronic back pian- without changes-        Jimmie Molly, MD 11/06/11 1927  Jimmie Molly, MD 11/06/11 1930  Jimmie Molly, MD 11/06/11 1932  Jimmie Molly, MD 11/06/11 4098

## 2011-11-06 NOTE — ED Notes (Signed)
C/O cold sxs x 1 wk w/ nasal drainage, swelling to nose, productive cough.  Has been taking Mucinex.  Also c/o chronic back pain - ran out of Percocet 10-325mg  1 wk ago - [facility] get [facility] on [facility].

## 2011-11-07 ENCOUNTER — Telehealth (HOSPITAL_COMMUNITY): Payer: Self-pay | Admitting: *Deleted

## 2011-12-25 ENCOUNTER — Emergency Department (HOSPITAL_COMMUNITY)
Admission: EM | Admit: 2011-12-25 | Discharge: 2011-12-25 | Disposition: A | Discharge status: Home / Self Care | Payer: Medicare Other | Attending: Emergency Medicine | Admitting: Emergency Medicine

## 2011-12-25 ENCOUNTER — Encounter (HOSPITAL_COMMUNITY): Payer: Self-pay | Admitting: *Deleted

## 2011-12-25 DIAGNOSIS — T148XXA Other injury of unspecified body region, initial encounter: Secondary | ICD-10-CM | POA: Insufficient documentation

## 2011-12-25 DIAGNOSIS — M549 Dorsalgia, unspecified: Secondary | ICD-10-CM | POA: Insufficient documentation

## 2011-12-25 DIAGNOSIS — K0889 Other specified disorders of teeth and supporting structures: Secondary | ICD-10-CM

## 2011-12-25 DIAGNOSIS — X500XXA Overexertion from strenuous movement or load, initial encounter: Secondary | ICD-10-CM | POA: Insufficient documentation

## 2011-12-25 DIAGNOSIS — F17200 Nicotine dependence, unspecified, uncomplicated: Secondary | ICD-10-CM | POA: Insufficient documentation

## 2011-12-25 DIAGNOSIS — K089 Disorder of teeth and supporting structures, unspecified: Secondary | ICD-10-CM | POA: Insufficient documentation

## 2011-12-25 DIAGNOSIS — I10 Essential (primary) hypertension: Secondary | ICD-10-CM | POA: Insufficient documentation

## 2011-12-25 DIAGNOSIS — G8929 Other chronic pain: Secondary | ICD-10-CM | POA: Insufficient documentation

## 2011-12-25 MED ORDER — KETOROLAC TROMETHAMINE 60 MG/2ML IM SOLN
60.0000 mg | Freq: Once | INTRAMUSCULAR | Status: AC
  Administered 2011-12-25: 60 mg via INTRAMUSCULAR
  Filled 2011-12-25: qty 2

## 2011-12-25 MED ORDER — OXYCODONE-ACETAMINOPHEN 5-325 MG PO TABS
1.0000 | ORAL_TABLET | Freq: Once | ORAL | Status: AC
  Administered 2011-12-25: 1 via ORAL
  Filled 2011-12-25: qty 1

## 2011-12-25 NOTE — ED Notes (Signed)
Pt c/o lower back pain - states pulled his muscles yesterday.  States had 9 teeth extracted last week and is c/o pain.

## 2011-12-25 NOTE — ED Provider Notes (Signed)
Medical screening examination/treatment/procedure(s) were performed by non-physician practitioner and as supervising physician I was immediately available for consultation/collaboration.  Zofia Peckinpaugh, MD 12/25/11 2215 

## 2011-12-25 NOTE — ED Provider Notes (Signed)
History     CSN: [id number]  Arrival date & time 12/25/11  4098   First MD Initiated Contact with Patient 12/25/11 [phone]      Chief Complaint  Patient presents with  . Back Pain    (Consider location/radiation/quality/duration/timing/severity/associated sxs/prior treatment) HPI Patient with hx chronic back pain who has been seen by multiple primary care providers and ER providers in [place] area in the past for chronic back pain presents to emergency department complaining of acute on chronic back pain stating he was playing with his granddaughter yesterday and felt a pull in his back when lifting her but also complaining of ongoing dental pain associated with having 9 teeth extracted over a week ago. Patient uses a cane at baseline for ambulation and is ambulating with mild pain in back. He denies lower extremity numbness or tingling, loss of bowel or bladder function, or saddle feet paresthesias. Patient states pain is aggravated by bending and movement and improved with rest. Patient denies fevers, chills, facial swelling or difficulty swallowing. He states he was prescribed percocet by his dentist but has run out and has not returned for follow up. Per ER note in November, patient voiced he would be following up with chronic pain management when being seen for acute on chronic back pain at that time but today states he has yet to make follow up because he wants to return to [place] where he is originally from to continue care for back pain there.   Past Medical History  Diagnosis Date  . Hypertension   . Chronic back pain   . Depression     Past Surgical History  Procedure Date  . Back surgery     History reviewed. No pertinent family history.  History  Substance Use Topics  . Smoking status: Current Everyday Smoker -- 0.5 packs/day    Types: Cigarettes  . Smokeless tobacco: Not on file  . Alcohol Use: No      Review of Systems  All other systems reviewed and are  negative.    Allergies  Aspirin; Ibuprofen; Iodine; and Shellfish allergy  Home Medications   Current Outpatient Rx  Name Route Sig Dispense Refill  . LISINOPRIL 40 MG PO TABS Oral Take 40 mg by mouth daily.      . OXYCODONE-ACETAMINOPHEN 5-325 MG PO TABS Oral Take 1 tablet by mouth every 4 (four) hours as needed. For pain      . PREGABALIN 200 MG PO CAPS Oral Take 200 mg by mouth 2 (two) times daily.      . SERTRALINE HCL 100 MG PO TABS Oral Take 100 mg by mouth daily.        BP 150/108  Pulse 70  Temp(Src) 97.5 F (36.4 C) (Oral)  Resp 20  Ht 5\' 6"  (1.676 m)  Wt 190 lb (86.183 kg)  BMI 30.67 kg/m2  SpO2 100%  Physical Exam  Nursing note and vitals reviewed. Constitutional: He is oriented to person, place, and time. He appears well-developed and well-nourished. No distress.  HENT:  Head: Normocephalic and atraumatic.       no edema or erythema of the gingiva status post multiple tooth retraction but no signs or symptoms of infection. Airway patent.  Eyes: Conjunctivae and EOM are normal. Pupils are equal, round, and reactive to light.  Neck: Normal range of motion. Neck supple.  Cardiovascular: Normal rate, regular rhythm, normal heart sounds and intact distal pulses.  Exam reveals no gallop and no friction rub.  No murmur heard. Pulmonary/Chest: Effort normal and breath sounds normal. No respiratory distress. He has no wheezes. He has no rales. He exhibits no tenderness.  Abdominal: Bowel sounds are normal. He exhibits no distension and no mass. There is no tenderness. There is no rebound and no guarding.  Musculoskeletal: Normal range of motion. He exhibits tenderness. He exhibits no edema.       Soft tissue tenderness to palpation of mid back but no spinal tenderness to palpation.  Lymphadenopathy:    He has no cervical adenopathy.  Neurological: He is alert and oriented to person, place, and time. He has normal reflexes.  Skin: Skin is warm and dry. No rash noted.  He is not diaphoretic. No erythema.  Psychiatric: He has a normal mood and affect.    ED Course  Procedures (including critical care time)  IM toradol and PO percocet  Labs Reviewed - No data to display No results found.   1. Chronic back pain   2. Muscle strain   3. Pain, dental       MDM  Well healed gingiva from previous tooth extractions with no signs of infection or swelling. Patent airway. Patient is afebrile with no facial swelling. Patient has chronic back pain with no signs or symptoms of cauda equina or central cord compression complaining of muscle strain with tenderness to palpation of soft tissue of lower back but no spinal tenderness to palpation. patient denies falling with episode causing strained muscle. Patient is ambulating with a cane which he uses at baseline without difficulty. Patient has been seen in emergency department numerous times with complaints of chronic back pain reports that he is returning to [place] to continue treatment of his chronic back pain.  see previous providers' notes regarding patient's pain management in the past by primary care providers. LE neurovasc intact with DTR's intact bilaterally of UE and LE.         [place], [place] 12/25/11 [phone]

## 2012-03-13 ENCOUNTER — Ambulatory Visit: Payer: Self-pay | Admitting: Family Medicine

## 2012-04-25 ENCOUNTER — Ambulatory Visit: Payer: Self-pay | Admitting: Family Medicine

## 2012-04-26 ENCOUNTER — Ambulatory Visit: Payer: Self-pay | Admitting: Family Medicine

## 2012-05-04 ENCOUNTER — Emergency Department: Payer: Self-pay | Admitting: Emergency Medicine

## 2012-09-01 ENCOUNTER — Emergency Department: Payer: Self-pay | Admitting: Unknown Physician Specialty

## 2012-09-26 ENCOUNTER — Emergency Department: Payer: Self-pay | Admitting: Emergency Medicine

## 2012-09-26 LAB — URINALYSIS, COMPLETE
Bacteria: NONE SEEN
Bilirubin,UR: NEGATIVE
Glucose,UR: NEGATIVE mg/dL (ref 0–75)
Ketone: NEGATIVE
Ph: 5 (ref 4.5–8.0)
RBC,UR: 2 /HPF (ref 0–5)
Squamous Epithelial: 2
WBC UR: 46 /HPF (ref 0–5)

## 2012-09-26 LAB — COMPREHENSIVE METABOLIC PANEL
Anion Gap: 8 (ref 7–16)
BUN: 13 mg/dL (ref 7–18)
Bilirubin,Total: 0.2 mg/dL (ref 0.2–1.0)
Chloride: 109 mmol/L — ABNORMAL HIGH (ref 98–107)
Co2: 28 mmol/L (ref 21–32)
EGFR (African American): >60
EGFR (Non-African Amer.): >60
Osmolality: 289 (ref 275–301)
Potassium: 3.9 mmol/L (ref 3.5–5.1)
SGPT (ALT): 22 U/L (ref 12–78)
Sodium: 145 mmol/L (ref 136–145)

## 2012-09-26 LAB — CBC
MCV: 95 fL (ref 80–100)
RBC: 5.12 10*6/uL (ref 4.40–5.90)
RDW: 13.6 % (ref 11.5–14.5)
WBC: 7.9 10*3/uL (ref 3.8–10.6)

## 2012-09-28 LAB — URINE CULTURE

## 2012-10-02 ENCOUNTER — Emergency Department: Payer: Self-pay | Admitting: Emergency Medicine

## 2012-10-05 ENCOUNTER — Emergency Department: Payer: Self-pay | Admitting: Emergency Medicine

## 2012-12-06 ENCOUNTER — Emergency Department: Payer: Self-pay | Admitting: Emergency Medicine

## 2013-04-16 DIAGNOSIS — K432 Incisional hernia without obstruction or gangrene: Secondary | ICD-10-CM | POA: Insufficient documentation

## 2013-04-16 DIAGNOSIS — M545 Low back pain, unspecified: Secondary | ICD-10-CM | POA: Insufficient documentation

## 2013-08-18 ENCOUNTER — Emergency Department: Payer: Self-pay | Admitting: Emergency Medicine

## 2013-08-18 LAB — BASIC METABOLIC PANEL
Anion Gap: 5 — ABNORMAL LOW (ref 7–16)
BUN: 10 mg/dL (ref 7–18)
Calcium, Total: 8.7 mg/dL (ref 8.5–10.1)
EGFR (Non-African Amer.): >60
Glucose: 94 mg/dL (ref 65–99)
Sodium: 142 mmol/L (ref 136–145)

## 2013-08-18 LAB — CBC
HGB: 15.5 g/dL (ref 13.0–18.0)
MCH: 31.9 pg (ref 26.0–34.0)
RBC: 4.86 10*6/uL (ref 4.40–5.90)
RDW: 13.4 % (ref 11.5–14.5)

## 2013-08-20 ENCOUNTER — Emergency Department: Payer: Self-pay | Admitting: Emergency Medicine

## 2013-09-04 ENCOUNTER — Emergency Department: Payer: Self-pay | Admitting: Emergency Medicine

## 2013-09-24 ENCOUNTER — Emergency Department: Payer: Self-pay | Admitting: Emergency Medicine

## 2013-09-25 LAB — DRUG SCREEN, URINE
Amphetamines, Ur Screen: NEGATIVE (ref ?–1000)
Barbiturates, Ur Screen: NEGATIVE (ref ?–200)
Cannabinoid 50 Ng, Ur ~~LOC~~: NEGATIVE (ref ?–50)
MDMA (Ecstasy)Ur Screen: NEGATIVE (ref ?–500)
Opiate, Ur Screen: NEGATIVE (ref ?–300)
Phencyclidine (PCP) Ur S: NEGATIVE (ref ?–25)
Tricyclic, Ur Screen: NEGATIVE (ref ?–1000)

## 2013-09-25 LAB — URINALYSIS, COMPLETE
Nitrite: NEGATIVE
Ph: 6 (ref 4.5–8.0)
RBC,UR: <1 /HPF (ref 0–5)
Squamous Epithelial: NONE SEEN
WBC UR: NONE SEEN /HPF (ref 0–5)

## 2013-12-11 ENCOUNTER — Emergency Department: Payer: Self-pay | Admitting: Emergency Medicine

## 2014-01-28 DIAGNOSIS — M792 Neuralgia and neuritis, unspecified: Secondary | ICD-10-CM | POA: Diagnosis present

## 2014-03-25 DIAGNOSIS — F1190 Opioid use, unspecified, uncomplicated: Secondary | ICD-10-CM | POA: Diagnosis present

## 2014-03-25 DIAGNOSIS — F32A Depression, unspecified: Secondary | ICD-10-CM | POA: Insufficient documentation

## 2014-10-27 ENCOUNTER — Emergency Department: Payer: Self-pay | Admitting: Emergency Medicine

## 2015-01-12 ENCOUNTER — Emergency Department: Payer: Self-pay | Admitting: Emergency Medicine

## 2015-01-12 LAB — CBC WITH DIFFERENTIAL/PLATELET
BASOS ABS: 0.1 10*3/uL (ref 0.0–0.1)
Basophil %: 1.2 %
EOS PCT: 5.2 %
Eosinophil #: 0.3 10*3/uL (ref 0.0–0.7)
HCT: 47 % (ref 40.0–52.0)
HGB: 15.3 g/dL (ref 13.0–18.0)
LYMPHS ABS: 2.7 10*3/uL (ref 1.0–3.6)
Lymphocyte %: 47.1 %
MCH: 30.6 pg (ref 26.0–34.0)
MCHC: 32.7 g/dL (ref 32.0–36.0)
MCV: 94 fL (ref 80–100)
MONO ABS: 0.4 x10 3/mm (ref 0.2–1.0)
MONOS PCT: 6.4 %
NEUTROS PCT: 40.1 %
Neutrophil #: 2.3 10*3/uL (ref 1.4–6.5)
PLATELETS: 273 10*3/uL (ref 150–440)
RBC: 5.02 10*6/uL (ref 4.40–5.90)
RDW: 13.5 % (ref 11.5–14.5)
WBC: 5.8 10*3/uL (ref 3.8–10.6)

## 2015-01-12 LAB — BASIC METABOLIC PANEL
ANION GAP: 6 — AB (ref 7–16)
BUN: 14 mg/dL (ref 7–18)
CALCIUM: 8.2 mg/dL — AB (ref 8.5–10.1)
CO2: 27 mmol/L (ref 21–32)
CREATININE: 0.97 mg/dL (ref 0.60–1.30)
Chloride: 105 mmol/L (ref 98–107)
EGFR (Non-African Amer.): >60
GLUCOSE: 88 mg/dL (ref 65–99)
Osmolality: 276 (ref 275–301)
Potassium: 4 mmol/L (ref 3.5–5.1)
Sodium: 138 mmol/L (ref 136–145)

## 2015-01-12 LAB — TROPONIN I

## 2015-02-11 ENCOUNTER — Emergency Department (HOSPITAL_COMMUNITY)
Admission: EM | Admit: 2015-02-11 | Discharge: 2015-02-12 | Disposition: A | Discharge status: Home / Self Care | Payer: Medicare Other | Attending: Emergency Medicine | Admitting: Emergency Medicine

## 2015-02-11 ENCOUNTER — Encounter (HOSPITAL_COMMUNITY): Payer: Self-pay | Admitting: Emergency Medicine

## 2015-02-11 DIAGNOSIS — M79605 Pain in left leg: Secondary | ICD-10-CM

## 2015-02-11 DIAGNOSIS — Z79899 Other long term (current) drug therapy: Secondary | ICD-10-CM | POA: Insufficient documentation

## 2015-02-11 DIAGNOSIS — G8929 Other chronic pain: Secondary | ICD-10-CM | POA: Insufficient documentation

## 2015-02-11 DIAGNOSIS — M545 Low back pain, unspecified: Secondary | ICD-10-CM

## 2015-02-11 DIAGNOSIS — R319 Hematuria, unspecified: Secondary | ICD-10-CM | POA: Diagnosis present

## 2015-02-11 DIAGNOSIS — F329 Major depressive disorder, single episode, unspecified: Secondary | ICD-10-CM | POA: Insufficient documentation

## 2015-02-11 DIAGNOSIS — Z720 Tobacco use: Secondary | ICD-10-CM | POA: Insufficient documentation

## 2015-02-11 DIAGNOSIS — I10 Essential (primary) hypertension: Secondary | ICD-10-CM | POA: Insufficient documentation

## 2015-02-11 NOTE — ED Notes (Signed)
Pt c/o blood in urine x 2 days. States his left leg "keeps locking up on me". C/o back pain.

## 2015-02-11 NOTE — ED Notes (Signed)
Pt reports he has been falling when his leg locks up "it is getting unbearable".

## 2015-02-11 NOTE — ED Notes (Signed)
Patient states that his back and legs locks up on him and causes him to fall. Patient standing in door way of room asking when the EDP is coming in. Reminded patient since he is a fall risk that he needs to stay in the bed.

## 2015-02-12 ENCOUNTER — Emergency Department (HOSPITAL_COMMUNITY): Payer: Medicare Other

## 2015-02-12 DIAGNOSIS — R319 Hematuria, unspecified: Secondary | ICD-10-CM | POA: Diagnosis not present

## 2015-02-12 LAB — URINALYSIS, ROUTINE W REFLEX MICROSCOPIC
Bilirubin Urine: NEGATIVE
GLUCOSE, UA: NEGATIVE mg/dL
Hgb urine dipstick: NEGATIVE
KETONES UR: NEGATIVE mg/dL
LEUKOCYTES UA: NEGATIVE
Nitrite: NEGATIVE
PROTEIN: NEGATIVE mg/dL
Specific Gravity, Urine: >1.03 — ABNORMAL HIGH (ref 1.005–1.030)
Urobilinogen, UA: 0.2 mg/dL (ref 0.0–1.0)
pH: 5.5 (ref 5.0–8.0)

## 2015-02-12 MED ORDER — PREDNISONE 20 MG PO TABS: ORAL_TABLET | ORAL | Status: DC

## 2015-02-12 MED ORDER — DEXAMETHASONE SODIUM PHOSPHATE 4 MG/ML IJ SOLN
10.0000 mg | Freq: Once | INTRAMUSCULAR | Status: AC
  Administered 2015-02-12: 10 mg via INTRAVENOUS
  Filled 2015-02-12: qty 3

## 2015-02-12 MED ORDER — CYCLOBENZAPRINE HCL 5 MG PO TABS: 5.0000 mg | ORAL_TABLET | Freq: Three times a day (TID) | ORAL | Status: DC | PRN

## 2015-02-12 MED ORDER — DIAZEPAM 5 MG/ML IJ SOLN
5.0000 mg | Freq: Once | INTRAMUSCULAR | Status: AC
  Administered 2015-02-12: 5 mg via INTRAVENOUS
  Filled 2015-02-12: qty 2

## 2015-02-12 NOTE — Discharge Instructions (Signed)
Use ice and heat on your back. Take the medications as prescribed. You will need to contact your primary care doctor to get referral to a pain management or back specialist.  Your tests today do not show any blood in your urine and the CT scan doesn't show any kidney stones.

## 2015-02-12 NOTE — ED Notes (Signed)
Discharge instructions given, pt fiance demonstrated teach back and verbal understanding. No concerns voiced.

## 2015-02-12 NOTE — ED Provider Notes (Signed)
CSN: [id number]     Arrival date & time 02/11/15  2029 History   First MD Initiated Contact with Patient 02/11/15 2330     Chief Complaint  Patient presents with  . Hematuria  . Back Pain     (Consider location/radiation/quality/duration/timing/severity/associated sxs/prior Treatment) HPI  Patient states this morning when he urinated he saw blood in his urine. Since then he no longer has seen any blood. He denies any flank pain, abdominal pain. He denies any nausea or vomiting.  Patient has chronic back pain and states he's had some back surgeries. They were done in 2006, 2007, and 2008 in [facility] in [place]. He states he's having chronic back pain. He states 3-4 days ago his left leg locks up when he walks. When it happens he states  his leg stays flexed at the knee and it makes him lose his balance. He denies any pain in his knee. He states he's having pain in the muscles in his thigh.   PCP Dr. Byrd Hesselbach but patient cannot tell me where Dr. Byrd Hesselbach practices.  Past Medical History  Diagnosis Date  . Hypertension   . Chronic back pain   . Depression    Past Surgical History  Procedure Laterality Date  . Back surgery     No family history on file. History  Substance Use Topics  . Smoking status: Current Every Day Smoker -- 0.50 packs/day    Types: Cigarettes  . Smokeless tobacco: Not on file  . Alcohol Use: No   on disability for his back  Review of Systems  All other systems reviewed and are negative.     Allergies  Aspirin; Ibuprofen; Iodine; and Shellfish allergy  Home Medications   Prior to Admission medications   Medication Sig Start Date End Date Taking? Authorizing Provider  cyclobenzaprine (FLEXERIL) 5 MG tablet Take 1 tablet (5 mg total) by mouth 3 (three) times daily as needed. 02/12/15   Ward Givens, MD  lisinopril (PRINIVIL,ZESTRIL) 40 MG tablet Take 40 mg by mouth daily.      Historical Provider, MD  oxyCODONE-acetaminophen  (PERCOCET) 5-325 MG per tablet Take 1 tablet by mouth every 4 (four) hours as needed. For pain      Historical Provider, MD  predniSONE (DELTASONE) 20 MG tablet Take 3 po QD x 3d , then 2 po QD x 3d then 1 po QD x 3d 02/12/15   Ward Givens, MD  pregabalin (LYRICA) 200 MG capsule Take 200 mg by mouth 2 (two) times daily.      Historical Provider, MD  sertraline (ZOLOFT) 100 MG tablet Take 100 mg by mouth daily.      Historical Provider, MD   BP 119/79 mmHg  Pulse 85  Temp(Src) 98.8 F (37.1 C) (Oral)  Resp 20  Ht  (1.702 m)  Wt 230 lb (104.327 kg)  BMI 36.01 kg/m2  SpO2 100%  Vital signs normal   Physical Exam  Constitutional: He is oriented to person, place, and time. He appears well-developed and well-nourished.  Non-toxic appearance. He does not appear ill. No distress.  HENT:  Head: Normocephalic and atraumatic.  Right Ear: External ear normal.  Left Ear: External ear normal.  Nose: Nose normal. No mucosal edema or rhinorrhea.  Mouth/Throat: Oropharynx is clear and moist and mucous membranes are normal. No dental abscesses or uvula swelling.  Eyes: Conjunctivae and EOM are normal. Pupils are equal, round, and reactive to light.  Neck: Normal range of  motion and full passive range of motion without pain. Neck supple.  Cardiovascular: Normal rate, regular rhythm and normal heart sounds.  Exam reveals no gallop and no friction rub.   No murmur heard. Pulmonary/Chest: Effort normal and breath sounds normal. No respiratory distress. He has no wheezes. He has no rhonchi. He has no rales. He exhibits no tenderness and no crepitus.  Abdominal: Soft. Normal appearance and bowel sounds are normal. He exhibits no distension. There is no tenderness. There is no rebound and no guarding.  Musculoskeletal: Normal range of motion. He exhibits no edema or tenderness.       Back:  Patient has diffuse tenderness of his lumbar spine. He has 3 well-healed surgical scars on his lumbar spine.  Patient has no pain with straight leg raising. He has patellar reflexes +2 and equal bilaterally. He shows me has pain in his left quads in in his left calf when he tries to straighten his leg.  Neurological: He is alert and oriented to person, place, and time. He has normal strength. No cranial nerve deficit.  Skin: Skin is warm, dry and intact. No rash noted. No erythema. No pallor.  Psychiatric: He has a normal mood and affect. His speech is normal and behavior is normal. His mood appears not anxious.  Nursing note and vitals reviewed.   ED Course  Procedures (including critical care time)  Medications  dexamethasone (DECADRON) injection 10 mg (10 mg Intravenous Given 02/12/15 0050)  diazepam (VALIUM) injection 5 mg (5 mg Intravenous Given 02/12/15 0050)    Patient seems improved after the medications. He still cannot recall where his PCP doctor is located or even what town. Patient was unaware of the broken screw. He was advised to follow-up with his PCP.   Review of the [place] database shows he has gotten 7 narcotic prescriptions filled since August 31. He was being seen at the [facility]. He got #120 oxycodone 10/325 on August 31. After that he had only gotten smaller quantities of oxycodone 10/325, hydrocodone 5/325, oxycodone 5/325, and tramadol from doctors from [facility], a dentist in [place], [facility] [place] [facility], another dentist in [place]. Review of the [place] database only shows tramadol filled in April.  Labs Review Results for orders placed or performed during the hospital encounter of 02/11/15  Urinalysis, Routine w reflex microscopic  Result Value Ref Range   Color, Urine YELLOW YELLOW   APPearance CLEAR CLEAR   Specific Gravity, Urine >1.030 (H) 1.005 - 1.030   pH 5.5 5.0 - 8.0   Glucose, UA NEGATIVE NEGATIVE mg/dL   Hgb urine dipstick NEGATIVE NEGATIVE   Bilirubin Urine NEGATIVE NEGATIVE   Ketones, ur  NEGATIVE NEGATIVE mg/dL   Protein, ur NEGATIVE NEGATIVE mg/dL   Urobilinogen, UA 0.2 0.0 - 1.0 mg/dL   Nitrite NEGATIVE NEGATIVE   Leukocytes, UA NEGATIVE NEGATIVE   Laboratory interpretation all normal   Laboratory interpretation all normal   Imaging Review Ct Renal Stone Study  02/12/2015   CLINICAL DATA:  Low back pain for 2 days.  Hematuria.  EXAM: CT ABDOMEN AND PELVIS WITHOUT CONTRAST  TECHNIQUE: Multidetector CT imaging of the abdomen and pelvis was performed following the standard protocol without IV contrast.  COMPARISON:  None.  FINDINGS: Mild dependent atelectasis is seen in the lung bases. No pleural or pericardial effusion.  There is no hydronephrosis on the right or left. No renal or ureteral stones are identified. The spleen, adrenal glands, pancreas,  gallbladder, liver and biliary tree all appear normal. Urinary bladder, seminal vesicles and prostate gland are unremarkable. The stomach, small and large bowel and appendix appear normal. Scattered, mild aortoiliac atherosclerosis without aneurysm is noted.  No lytic or sclerotic bony lesion is seen. The patient is status post L5-S1 fusion. The left S1 screw is broken. No osseous fusion across the disc interspace or posterior elements is identified.  IMPRESSION: Negative for urinary tract stone.  Status post L5-S1 fusion. The left S1 screw is broken and no osseous fusion is seen across the disc interspace or posterior elements.   Electronically Signed   By: Drusilla Kanner M.D.   On: 02/12/2015 01:21     EKG Interpretation None      MDM   Final diagnoses:  Hematuria  Chronic lower back pain  Pain of left lower extremity    New Prescriptions   CYCLOBENZAPRINE (FLEXERIL) 5 MG TABLET    Take 1 tablet (5 mg total) by mouth 3 (three) times daily as needed.   PREDNISONE (DELTASONE) 20 MG TABLET    Take 3 po QD x 3d , then 2 po QD x 3d then 1 po QD x 3d    Plan discharge  Devoria Albe, MD, Franz Dell,  MD 02/12/15 [id number]

## 2015-02-12 NOTE — ED Notes (Signed)
Medcations given per order, pt asleep.

## 2015-03-09 ENCOUNTER — Emergency Department: Payer: Self-pay | Admitting: Emergency Medicine

## 2015-08-06 ENCOUNTER — Emergency Department
Admission: EM | Admit: 2015-08-06 | Discharge: 2015-08-06 | Disposition: A | Discharge status: Home / Self Care | Payer: Medicare Other | Attending: Emergency Medicine | Admitting: Emergency Medicine

## 2015-08-06 ENCOUNTER — Emergency Department: Payer: Medicare Other

## 2015-08-06 ENCOUNTER — Encounter: Payer: Self-pay | Admitting: *Deleted

## 2015-08-06 DIAGNOSIS — M545 Low back pain, unspecified: Secondary | ICD-10-CM

## 2015-08-06 DIAGNOSIS — Z79811 Long term (current) use of aromatase inhibitors: Secondary | ICD-10-CM | POA: Insufficient documentation

## 2015-08-06 DIAGNOSIS — Z720 Tobacco use: Secondary | ICD-10-CM | POA: Diagnosis not present

## 2015-08-06 DIAGNOSIS — I10 Essential (primary) hypertension: Secondary | ICD-10-CM | POA: Diagnosis not present

## 2015-08-06 DIAGNOSIS — Z79899 Other long term (current) drug therapy: Secondary | ICD-10-CM | POA: Insufficient documentation

## 2015-08-06 DIAGNOSIS — G8929 Other chronic pain: Secondary | ICD-10-CM | POA: Insufficient documentation

## 2015-08-06 LAB — URINALYSIS COMPLETE WITH MICROSCOPIC (ARMC ONLY)
BACTERIA UA: NONE SEEN
Bilirubin Urine: NEGATIVE
GLUCOSE, UA: NEGATIVE mg/dL
Hgb urine dipstick: NEGATIVE
KETONES UR: NEGATIVE mg/dL
Leukocytes, UA: NEGATIVE
NITRITE: NEGATIVE
PROTEIN: NEGATIVE mg/dL
RBC / HPF: NONE SEEN RBC/hpf (ref 0–5)
SPECIFIC GRAVITY, URINE: 1.01 (ref 1.005–1.030)
pH: 5 (ref 5.0–8.0)

## 2015-08-06 MED ORDER — OXYCODONE-ACETAMINOPHEN 5-325 MG PO TABS: 1.0000 | ORAL_TABLET | Freq: Four times a day (QID) | ORAL | Status: DC | PRN

## 2015-08-06 NOTE — ED Notes (Signed)
States he is out of percocet,lyrica and zoloft.  Hx of an old back surgery in [place]. Ambulates with cane and slight limp. States he has an appointment with [facility] on the 29th of this month.

## 2015-08-06 NOTE — ED Provider Notes (Signed)
[facility] Emergency Department Provider Note  ____________________________________________  Time seen: Approximately 7:27 AM  I have reviewed the triage vital signs and the nursing notes.   HISTORY  Chief Complaint Back pain Chronic pain Medication refill  HPI Roosevelt Eimers is a 44 y.o. male is here today because he is out of his Percocet, Lyrica and Zoloft. He states that he had surgery and [place] and 2006, 2007, and 2008 for back pain. He states he was told at [facility] that his pins in his back were fracturewithout a history of injury. He states that he has an appointment with [facility] on 7/29 and waiting for a appointment for a pain management clinic. He states the doctor that he was seen will not prescribe any more pain medication for him. He states he has been out of Zoloft for 3-4 weeks. He has been out of Percocet approximately same amount of time. He denies seeing anyone for his medication. He states currently his pain is 10 out of 10. He also has a history of kidney stones.   Past Medical History  Diagnosis Date  . Hypertension   . Chronic back pain   . Depression     Patient Active Problem List   Diagnosis Date Noted  . BACK PAIN 01/07/2011  . ADVERSE DRUG REACTION 01/01/2011  . UNSPECIFIED ESSENTIAL HYPERTENSION 12/28/2010  . LOW BACK PAIN SYNDROME, SEVERE 12/28/2010  . OTHER URINARY INCONTINENCE 12/28/2010    Past Surgical History  Procedure Laterality Date  . Back surgery      Current Outpatient Rx  Name  Route  Sig  Dispense  Refill  . cyclobenzaprine (FLEXERIL) 5 MG tablet   Oral   Take 1 tablet (5 mg total) by mouth 3 (three) times daily as needed.   30 tablet   0   . lisinopril (PRINIVIL,ZESTRIL) 40 MG tablet   Oral   Take 40 mg by mouth daily.           [name] oxyCODONE-acetaminophen (PERCOCET) 5-325 MG per tablet   Oral   Take 1 tablet by mouth every 4 (four) hours as needed. For pain            . predniSONE (DELTASONE) 20 MG tablet      Take 3 po QD x 3d , then 2 po QD x 3d then 1 po QD x 3d   18 tablet   0   . pregabalin (LYRICA) 200 MG capsule   Oral   Take 200 mg by mouth 2 (two) times daily.           . sertraline (ZOLOFT) 100 MG tablet   Oral   Take 100 mg by mouth daily.             Allergies Aspirin; Ibuprofen; Iodine; and Shellfish allergy  No family history on file.  Social History Social History  Substance Use Topics  . Smoking status: Current Every Day Smoker -- 0.50 packs/day    Types: Cigarettes  . Smokeless tobacco: None  . Alcohol Use: No    Review of Systems Constitutional: No fever/chills Eyes: No visual changes. ENT: No sore throat. Cardiovascular: Denies chest pain. Respiratory: Denies shortness of breath. Gastrointestinal: No abdominal pain.  No nausea, no vomiting.  Genitourinary: Negative for dysuria. Musculoskeletal: Positive for chronic back pain  Skin: Negative for rash. Neurological: Negative for headaches, focal weakness or numbness.  10-point ROS otherwise negative.  ____________________________________________   PHYSICAL EXAM:  VITAL SIGNS: ED  Triage Vitals  Enc Vitals Group     BP 08/06/15 0705 115/88 mmHg     Pulse Rate 08/06/15 0705 77     Resp 08/06/15 0705 20     Temp 08/06/15 0705 97.7 F (36.5 C)     Temp Source 08/06/15 0705 Oral     SpO2 08/06/15 0705 98 %     Weight 08/06/15 0705 200 lb (90.719 kg)     Height 08/06/15 0705  (1.676 m)     Head Cir --      Peak Flow --      Pain Score 08/06/15 0706 10     Pain Loc --      Pain Edu? --      Excl. in GC? --     Constitutional: Alert and oriented. Well appearing and in no acute distress. Eyes: Conjunctivae are normal. PERRL. EOMI. Head: Atraumatic. Nose: No congestion/rhinnorhea. Neck: No stridor.   Cardiovascular: Normal rate, regular rhythm. Grossly normal heart sounds.  Good peripheral circulation. Respiratory: Normal respiratory  effort.  No retractions. Lungs CTAB. Gastrointestinal: Soft and nontender. No distention. No abdominal bruits. No CVA tenderness. Musculoskeletal: Back exam there is generalized tenderness but no point tenderness. Patient has a small limp with the use of cane but is still ambulatory in the emergency room. No lower extremity tenderness nor edema.  No joint effusions. Straight leg raises were possibly 45 with discomfort. Neurologic:  Normal speech and language. No gross focal neurologic deficits are appreciated. No gait instability. Reflexes 1+ bilaterally Skin:  Skin is warm, dry and intact. No rash noted. Psychiatric: Mood and affect are normal. Speech and behavior are normal.  ____________________________________________   LABS (all labs ordered are listed, but only abnormal results are displayed)  Labs Reviewed - No data to display   RADIOLOGY  CT of lumbar spine was done and per radiologist there is no change in the appearance of the hardware since 2011. He has a chronically fractured screw at the left L1 and right-sided inner connecting wide does not engage the right S1 sclerae.  ____________________________________________   PROCEDURES  Procedure(s) performed: None  Critical Care performed: No  ____________________________________________   INITIAL IMPRESSION / ASSESSMENT AND PLAN / ED COURSE  Pertinent labs & imaging results that were available during my care of the patient were reviewed by me and considered in my medical decision making (see chart for details).  Patient was informed that there has been no change in his hardware since a comparison of his 2011 images. He states he was told at [facility] that they were fractured. His medications were also looked up on the DA website and patient did receive Lyrica from his doctor in [place] and 8/4 2016. He also received oxycodone 10/325 150 tablets on 07/02/2015 from same doctor. Patient was told to contact this doctor for any  continued medication. He is to keep his appointment with the pain management. Patient then states that his doctor will not write any narcotics and that is why he sending him to pain management. Patient was given a prescription for Percocet 5/325 #8 he is still instructed to contact his doctor in [place]. ____________________________________________   FINAL CLINICAL IMPRESSION(S) / ED DIAGNOSES  Chronic pain Back pain Medication seeking    Tommi Rumps, PA-C 08/06/15 1207  I have reviewed the mid-level's documentation and was available to the mid-level if needed during evaluation of this patient.   Darien Ramus, MD 08/06/15 [phone]

## 2015-08-06 NOTE — Discharge Instructions (Signed)
Keep your doctor appointment on 8/29 for pain management Call your doctor if any continued problems  Percocet # 8 until you see or talk to your doctor

## 2015-08-06 NOTE — ED Notes (Signed)
Out of back pain meds, has some back pain, urinary problems

## 2017-04-22 ENCOUNTER — Emergency Department
Admission: EM | Admit: 2017-04-22 | Discharge: 2017-04-22 | Disposition: A | Discharge status: Home / Self Care | Payer: Medicare Other | Attending: Emergency Medicine | Admitting: Emergency Medicine

## 2017-04-22 ENCOUNTER — Encounter: Payer: Self-pay | Admitting: Medical Oncology

## 2017-04-22 DIAGNOSIS — M5432 Sciatica, left side: Secondary | ICD-10-CM | POA: Diagnosis not present

## 2017-04-22 DIAGNOSIS — F17210 Nicotine dependence, cigarettes, uncomplicated: Secondary | ICD-10-CM | POA: Insufficient documentation

## 2017-04-22 DIAGNOSIS — Z79899 Other long term (current) drug therapy: Secondary | ICD-10-CM | POA: Insufficient documentation

## 2017-04-22 DIAGNOSIS — M25552 Pain in left hip: Secondary | ICD-10-CM | POA: Diagnosis present

## 2017-04-22 DIAGNOSIS — I10 Essential (primary) hypertension: Secondary | ICD-10-CM | POA: Diagnosis not present

## 2017-04-22 MED ORDER — OXYCODONE-ACETAMINOPHEN 7.5-325 MG PO TABS: 1.0000 | ORAL_TABLET | Freq: Four times a day (QID) | ORAL | 0 refills | Status: DC | PRN

## 2017-04-22 MED ORDER — HYDROMORPHONE HCL 1 MG/ML IJ SOLN
1.0000 mg | Freq: Once | INTRAMUSCULAR | Status: AC
  Administered 2017-04-22: 1 mg via INTRAMUSCULAR
  Filled 2017-04-22: qty 1

## 2017-04-22 MED ORDER — METHOCARBAMOL 750 MG PO TABS: 1500.0000 mg | ORAL_TABLET | Freq: Four times a day (QID) | ORAL | 0 refills | Status: DC

## 2017-04-22 MED ORDER — ORPHENADRINE CITRATE 30 MG/ML IJ SOLN
60.0000 mg | Freq: Two times a day (BID) | INTRAMUSCULAR | Status: DC
  Administered 2017-04-22: 60 mg via INTRAMUSCULAR
  Filled 2017-04-22: qty 2

## 2017-04-22 NOTE — ED Triage Notes (Signed)
Pt reports that he has been lifting his four year old up a lot and reports left hip pain. Pt ambulatory.

## 2017-04-22 NOTE — ED Notes (Signed)
Patient is complaining of left back, hip and leg pain that began approx. 3 days ago.  Patient denies known injury.  Patient states several days ago he lifted his four year old and he feels he may have "overdid it".  Patient has a slight limp to treatment room.  No obvious distress at this time.

## 2017-04-22 NOTE — ED Provider Notes (Signed)
[facility] Emergency Department Provider Note   ____________________________________________   None    (approximate)  I have reviewed the triage vital signs and the nursing notes.   HISTORY  Chief Complaint Hip Pain    HPI Shin Lamour is a 46 y.o. male patient complaining of radicular back pain to left lower extremity secondary to lifting incident 3 days ago. Patient has a history of chronic back pain and effusion failure of L5-S1. Patient is being considered for surgical intervention. Patient denies any bladder or bowel dysfunction. Patient rates his pain as a 10 over 10.Patient is taking Lyrica intermitting usage of narcotic pain medication until surgery.   Past Medical History:  Diagnosis Date  . Chronic back pain   . Depression   . Hypertension     Patient Active Problem List   Diagnosis Date Noted  . BACK PAIN 01/07/2011  . ADVERSE DRUG REACTION 01/01/2011  . UNSPECIFIED ESSENTIAL HYPERTENSION 12/28/2010  . LOW BACK PAIN SYNDROME, SEVERE 12/28/2010  . OTHER URINARY INCONTINENCE 12/28/2010    Past Surgical History:  Procedure Laterality Date  . BACK SURGERY      Prior to Admission medications   Medication Sig Start Date End Date Taking? Authorizing Provider  cyclobenzaprine (FLEXERIL) 5 MG tablet Take 1 tablet (5 mg total) by mouth 3 (three) times daily as needed. 02/12/15   Devoria Albe, MD  lisinopril (PRINIVIL,ZESTRIL) 40 MG tablet Take 40 mg by mouth daily.      Historical Provider, MD  methocarbamol (ROBAXIN-750) 750 MG tablet Take 2 tablets (1,500 mg total) by mouth 4 (four) times daily. 04/22/17   Joni Reining, PA-C  oxyCODONE-acetaminophen (PERCOCET) 5-325 MG per tablet Take 1 tablet by mouth every 6 (six) hours as needed for severe pain. 08/06/15   Tommi Rumps, PA-C  oxyCODONE-acetaminophen (PERCOCET) 7.5-325 MG tablet Take 1 tablet by mouth every 6 (six) hours as needed for severe pain. 04/22/17   Joni Reining, PA-C    predniSONE (DELTASONE) 20 MG tablet Take 3 po QD x 3d , then 2 po QD x 3d then 1 po QD x 3d 02/12/15   Devoria Albe, MD  pregabalin (LYRICA) 200 MG capsule Take 200 mg by mouth 2 (two) times daily.      Historical Provider, MD  sertraline (ZOLOFT) 100 MG tablet Take 100 mg by mouth daily.      Historical Provider, MD    Allergies Aspirin; Ibuprofen; Iodine; and Shellfish allergy  No family history on file.  Social History Social History  Substance Use Topics  . Smoking status: Current Every Day Smoker    Packs/day: 0.50    Types: Cigarettes  . Smokeless tobacco: Not on file  . Alcohol use No    Review of Systems  Constitutional: No fever/chills Eyes: No visual changes. ENT: No sore throat. Cardiovascular: Denies chest pain. Respiratory: Denies shortness of breath. Gastrointestinal: No abdominal pain.  No nausea, no vomiting.  No diarrhea.  No constipation. Genitourinary: Chronic back pain  Musculoskeletal: Negative for back pain. Skin: Negative for rash. Neurological: Negative for headaches, focal weakness or numbness. Psychiatric:Depression   ____________________________________________   PHYSICAL EXAM:  VITAL SIGNS: ED Triage Vitals [04/22/17 1647]  Enc Vitals Group     BP (!) 146/91     Pulse Rate 85     Resp 18     Temp 99 F (37.2 C)     Temp Source Oral     SpO2 97 %  Weight 200 lb (90.7 kg)     Height  (1.676 m)     Head Circumference      Peak Flow      Pain Score 10     Pain Loc      Pain Edu?      Excl. in GC?     Constitutional: Alert and oriented. Well appearing and in no acute distress. Eyes: Conjunctivae are normal. PERRL. EOMI. Head: Atraumatic. Nose: No congestion/rhinnorhea. Mouth/Throat: Mucous membranes are moist.  Oropharynx non-erythematous. Neck: No stridor.  No cervical spine tenderness to palpation. Hematological/Lymphatic/Immunilogical: No cervical lymphadenopathy. Cardiovascular: Normal rate, regular rhythm. Grossly  normal heart sounds.  Good peripheral circulation. Respiratory: Normal respiratory effort.  No retractions. Lungs CTAB. Gastrointestinal: Soft and nontender. No distention. No abdominal bruits. No CVA tenderness. Musculoskeletal: No spinal deformity. Surgical scar consistent with history. Patient decreased range of motion with flexion. Moderate guarding palpation L4-S1. Neurologic:  Normal speech and language. No gross focal neurologic deficits are appreciated. No gait instability. Skin:  Skin is warm, dry and intact. No rash noted. Psychiatric: Mood and affect are normal. Speech and behavior are normal.  ____________________________________________   LABS (all labs ordered are listed, but only abnormal results are displayed)  Labs Reviewed - No data to display ____________________________________________  EKG   ____________________________________________  RADIOLOGY  Reviewed CT findings 2016 showing non-fusion of L5-S1. ____________________________________________   PROCEDURES  Procedure(s) performed: None  Procedures  Critical Care performed: No  ____________________________________________   INITIAL IMPRESSION / ASSESSMENT AND PLAN / ED COURSE  Pertinent labs & imaging results that were available during my care of the patient were reviewed by me and considered in my medical decision making (see chart for details).  Acute low back pain secondary to lifting incident. Patient given discharge care instructions. Patient advised to follow-up with family doctor for continued care.      ____________________________________________   FINAL CLINICAL IMPRESSION(S) / ED DIAGNOSES  Final diagnoses:  Sciatica of left side      NEW MEDICATIONS STARTED DURING THIS VISIT:  New Prescriptions   METHOCARBAMOL (ROBAXIN-750) 750 MG TABLET    Take 2 tablets (1,500 mg total) by mouth 4 (four) times daily.   OXYCODONE-ACETAMINOPHEN (PERCOCET) 7.5-325 MG TABLET    Take 1  tablet by mouth every 6 (six) hours as needed for severe pain.     Note:  This document was prepared using Dragon voice recognition software and may include unintentional dictation errors.    Joni Reining, PA-C 04/22/17 1733    Nita Sickle, MD 04/26/17 2046

## 2017-10-25 ENCOUNTER — Emergency Department (HOSPITAL_COMMUNITY): Payer: Medicare HMO

## 2017-10-25 ENCOUNTER — Encounter (HOSPITAL_COMMUNITY): Payer: Self-pay

## 2017-10-25 ENCOUNTER — Emergency Department (HOSPITAL_COMMUNITY)
Admission: EM | Admit: 2017-10-25 | Discharge: 2017-10-25 | Disposition: A | Discharge status: Home / Self Care | Payer: Medicare HMO | Attending: Emergency Medicine | Admitting: Emergency Medicine

## 2017-10-25 DIAGNOSIS — R1084 Generalized abdominal pain: Secondary | ICD-10-CM | POA: Insufficient documentation

## 2017-10-25 DIAGNOSIS — I69354 Hemiplegia and hemiparesis following cerebral infarction affecting left non-dominant side: Secondary | ICD-10-CM | POA: Insufficient documentation

## 2017-10-25 DIAGNOSIS — I10 Essential (primary) hypertension: Secondary | ICD-10-CM | POA: Diagnosis not present

## 2017-10-25 DIAGNOSIS — R319 Hematuria, unspecified: Secondary | ICD-10-CM | POA: Diagnosis not present

## 2017-10-25 DIAGNOSIS — Z7902 Long term (current) use of antithrombotics/antiplatelets: Secondary | ICD-10-CM | POA: Diagnosis not present

## 2017-10-25 DIAGNOSIS — Z79899 Other long term (current) drug therapy: Secondary | ICD-10-CM | POA: Insufficient documentation

## 2017-10-25 DIAGNOSIS — Z960 Presence of urogenital implants: Secondary | ICD-10-CM | POA: Diagnosis not present

## 2017-10-25 DIAGNOSIS — F17210 Nicotine dependence, cigarettes, uncomplicated: Secondary | ICD-10-CM | POA: Diagnosis not present

## 2017-10-25 HISTORY — DX: Anxiety disorder, unspecified: F41.9

## 2017-10-25 HISTORY — DX: Hyperlipidemia, unspecified: E78.5

## 2017-10-25 HISTORY — DX: Personality disorder, unspecified: F60.9

## 2017-10-25 HISTORY — DX: Hemiplegia, unspecified affecting left nondominant side: G81.94

## 2017-10-25 HISTORY — DX: Anemia, unspecified: D64.9

## 2017-10-25 HISTORY — DX: Cerebral infarction, unspecified: I63.9

## 2017-10-25 HISTORY — DX: Unspecified mood (affective) disorder: F39

## 2017-10-25 HISTORY — DX: Restless legs syndrome: G25.81

## 2017-10-25 LAB — CBC WITH DIFFERENTIAL/PLATELET
BASOS ABS: 0 10*3/uL (ref 0.0–0.1)
Basophils Relative: 1 %
EOS ABS: 0.4 10*3/uL (ref 0.0–0.7)
EOS PCT: 6 %
HCT: 40.2 % (ref 39.0–52.0)
Hemoglobin: 13.1 g/dL (ref 13.0–17.0)
LYMPHS PCT: 42 %
Lymphs Abs: 2.6 10*3/uL (ref 0.7–4.0)
MCH: 30.3 pg (ref 26.0–34.0)
MCHC: 32.6 g/dL (ref 30.0–36.0)
MCV: 93.1 fL (ref 78.0–100.0)
MONO ABS: 0.6 10*3/uL (ref 0.1–1.0)
Monocytes Relative: 10 %
Neutro Abs: 2.5 10*3/uL (ref 1.7–7.7)
Neutrophils Relative %: 41 %
PLATELETS: 281 10*3/uL (ref 150–400)
RBC: 4.32 MIL/uL (ref 4.22–5.81)
RDW: 13.5 % (ref 11.5–15.5)
WBC: 6 10*3/uL (ref 4.0–10.5)

## 2017-10-25 LAB — URINALYSIS, ROUTINE W REFLEX MICROSCOPIC
Bacteria, UA: NONE SEEN
Bilirubin Urine: NEGATIVE
GLUCOSE, UA: NEGATIVE mg/dL
Ketones, ur: NEGATIVE mg/dL
Nitrite: NEGATIVE
PH: 6 (ref 5.0–8.0)
Protein, ur: 100 mg/dL — AB
SPECIFIC GRAVITY, URINE: 1.025 (ref 1.005–1.030)
Squamous Epithelial / LPF: NONE SEEN

## 2017-10-25 LAB — COMPREHENSIVE METABOLIC PANEL
ALBUMIN: 3.8 g/dL (ref 3.5–5.0)
ALK PHOS: 85 U/L (ref 38–126)
ALT: 19 U/L (ref 17–63)
AST: 23 U/L (ref 15–41)
Anion gap: 7 (ref 5–15)
BILIRUBIN TOTAL: 0.4 mg/dL (ref 0.3–1.2)
BUN: 11 mg/dL (ref 6–20)
CALCIUM: 9 mg/dL (ref 8.9–10.3)
CO2: 29 mmol/L (ref 22–32)
CREATININE: 0.82 mg/dL (ref 0.61–1.24)
Chloride: 103 mmol/L (ref 101–111)
GFR calc Af Amer: >60 mL/min (ref >60)
GLUCOSE: 88 mg/dL (ref 65–99)
POTASSIUM: 3.8 mmol/L (ref 3.5–5.1)
Sodium: 139 mmol/L (ref 135–145)
TOTAL PROTEIN: 7 g/dL (ref 6.5–8.1)

## 2017-10-25 LAB — LIPASE, BLOOD: Lipase: 23 U/L (ref 11–51)

## 2017-10-25 MED ORDER — MORPHINE SULFATE (PF) 4 MG/ML IV SOLN
4.0000 mg | INTRAVENOUS | Status: AC | PRN
  Administered 2017-10-25 (×2): 4 mg via INTRAVENOUS
  Filled 2017-10-25 (×2): qty 1

## 2017-10-25 MED ORDER — DEXTROSE 5 % IV SOLN
1.0000 g | Freq: Once | INTRAVENOUS | Status: AC
  Administered 2017-10-25: 1 g via INTRAVENOUS
  Filled 2017-10-25: qty 10

## 2017-10-25 MED ORDER — CEPHALEXIN 500 MG PO CAPS: 500.0000 mg | ORAL_CAPSULE | Freq: Four times a day (QID) | ORAL | 0 refills | Status: DC

## 2017-10-25 MED ORDER — DIAZEPAM 2 MG PO TABS
2.0000 mg | ORAL_TABLET | Freq: Once | ORAL | Status: AC
  Administered 2017-10-25: 2 mg via ORAL
  Filled 2017-10-25: qty 1

## 2017-10-25 NOTE — ED Triage Notes (Signed)
Ems reports pt is a resident [facility] [place] because of r sided weakness due to stroke.  Reports r sided abd pain for the past few days.  [facility] and was given [facility] foley cath.  Today noticed gross hematuria in catheter.  Still c/o abd pain.  Reports pain from catheter.

## 2017-10-25 NOTE — ED Provider Notes (Signed)
[facility] EMERGENCY DEPARTMENT Provider Note   CSN: [id number] Arrival date & time: 10/25/17  1721     History   Chief Complaint Chief Complaint  Patient presents with  . Abdominal Pain    HPI York SpanielRonnie Fern is a 46 y.o. male.  HPI  Pt was seen at 1740.  Per pt, c/o gradual onset and persistence of constant generalized abd "pain" for the past 4 to 5 days.  Has been associated with hematuria.  Describes the abd pain as "it feels like the urine is getting stopped up."  Pt was evaluated by OSH ED 2 days ago and had a foley catheter placed without improvement in his pain. Denies N/V, no diarrhea, no fevers, no back pain, no rash, no CP/SOB, no black or blood in stools, no testicular pain/swelling, no dysuria.      Past Medical History:  Diagnosis Date  . Anemia   . Anxiety   . Chronic back pain   . Chronic pain   . Depression   . Hemiplegia affecting left nondominant side (HCC)   . Hyperlipidemia   . Hypertension   . Mood disorder (HCC)   . Personality disorder (HCC)   . Restless leg syndrome   . Stroke [facility](HCC)     Patient Active Problem List   Diagnosis Date Noted  . BACK PAIN 01/07/2011  . ADVERSE DRUG REACTION 01/01/2011  . UNSPECIFIED ESSENTIAL HYPERTENSION 12/28/2010  . LOW BACK PAIN SYNDROME, SEVERE 12/28/2010  . OTHER URINARY INCONTINENCE 12/28/2010    Past Surgical History:  Procedure Laterality Date  . BACK SURGERY         Home Medications    Prior to Admission medications   Medication Sig Start Date End Date Taking? Authorizing Provider  atorvastatin (LIPITOR) 40 MG tablet Take 40 mg by mouth every evening.   Yes [provider]  clopidogrel (PLAVIX) 75 MG tablet Take 75 mg by mouth daily.   Yes [provider]  gabapentin (NEURONTIN) 800 MG tablet Take 800 mg by mouth 3 (three) times daily.   Yes [provider]  lisinopril (PRINIVIL,ZESTRIL) 40 MG tablet Take 40 mg by mouth daily.     Yes [provider]    methadone (DOLOPHINE) 1 MG/1ML solution Take 10 mg by mouth every 12 (twelve) hours.   Yes [provider]  oxyCODONE-acetaminophen (PERCOCET) 7.5-325 MG tablet Take 1 tablet by mouth every 6 (six) hours as needed for severe pain. 04/22/17  Yes Joni ReiningSmith, Ronald K, PA-C  pregabalin (LYRICA) 300 MG capsule Take 300 mg by mouth 2 (two) times daily.    Yes [provider]  sertraline (ZOLOFT) 100 MG tablet Take 100 mg by mouth 2 (two) times daily.    Yes [provider]  traMADol (ULTRAM) 50 MG tablet Take 50 mg by mouth every 8 (eight) hours as needed for moderate pain.   Yes [provider]  tuberculin 5 UNIT/0.1ML injection Inject 0.1 mLs into the skin once.   Yes [provider]  zolpidem (AMBIEN) 10 MG tablet Take 10 mg by mouth at bedtime.   Yes [provider]    Family History No family history on file.  Social History Social History  Substance Use Topics  . Smoking status: Current Every Day Smoker    Packs/day: 0.50    Types: Cigarettes  . Smokeless tobacco: Never Used  . Alcohol use No     Allergies   Aspirin; Ibuprofen; Iodine; and Shellfish allergy   Review of  Systems Review of Systems ROS: Statement: All systems negative except as marked or noted in the HPI; Constitutional: Negative for fever and chills. ; ; Eyes: Negative for eye pain, redness and discharge. ; ; ENMT: Negative for ear pain, hoarseness, nasal congestion, sinus pressure and sore throat. ; ; Cardiovascular: Negative for chest pain, palpitations, diaphoresis, dyspnea and peripheral edema. ; ; Respiratory: Negative for cough, wheezing and stridor. ; ; Gastrointestinal: +abd pain. Negative for nausea, vomiting, diarrhea, blood in stool, hematemesis, jaundice and rectal bleeding. . ; ; Genitourinary: Negative for dysuria, flank pain and +hematuria. ; ; Genital:  No penile drainage or rash, no testicular pain or swelling, no scrotal rash or swelling. ;;  Musculoskeletal: Negative for back pain and neck pain. Negative for swelling and trauma.; ; Skin: Negative for pruritus, rash, abrasions, blisters, bruising and skin lesion.; ; Neuro: Negative for headache, lightheadedness and neck stiffness. Negative for weakness, altered level of consciousness, altered mental status, extremity weakness, paresthesias, involuntary movement, seizure and syncope.       Physical Exam Updated Vital Signs BP 125/87 (BP Location: Right Arm)   Pulse 79   Temp 98.4 F (36.9 C) (Oral)   Resp 18   Ht 5\' 7"  (1.702 m)   Wt 113.4 kg (250 lb)   SpO2 97%   BMI 39.16 kg/m   Physical Exam 1745: Physical examination:  Nursing notes reviewed; Vital signs and O2 SAT reviewed;  Constitutional: Well developed, Well nourished, Well hydrated, Uncomfortable appearing; Head:  Normocephalic, atraumatic; Eyes: EOMI, PERRL, No scleral icterus; ENMT: Mouth and pharynx normal, Mucous membranes moist; Neck: Supple, Full range of motion, No lymphadenopathy; Cardiovascular: Regular rate and rhythm, No gallop; Respiratory: Breath sounds clear & equal bilaterally, No wheezes.  Speaking full sentences with ease, Normal respiratory effort/excursion; Chest: Nontender, Movement normal; Abdomen: Soft, +diffuse tenderness to palp. No rebound or guarding. Nondistended, Normal bowel sounds; Genitourinary: No CVA tenderness. +Foley draining hematuria.; Extremities: Pulses normal, No tenderness, No edema, No calf edema or asymmetry.; Neuro: AA&Ox3, Major CN grossly intact.  Speech clear. +left sided hemiparesis per hx, otherwise no gross focal motor deficits in extremities.; Skin: Color normal, Warm, Dry.   ED Treatments / Results  Labs (all labs ordered are listed, but only abnormal results are displayed)   EKG  EKG Interpretation None       Radiology   Procedures Procedures (including critical care time)  Medications Ordered in ED Medications  morphine 4 MG/ML injection 4 mg (4 mg  Intravenous Given 10/25/17 1813)     Initial Impression / Assessment and Plan / ED Course  I have reviewed the triage vital signs and the nursing notes.  Pertinent labs & imaging results that were available during my care of the patient were reviewed by me and considered in my medical decision making (see chart for details).  MDM Reviewed: previous chart, nursing note and vitals Reviewed previous: labs Interpretation: labs and CT scan   Results for orders placed or performed during the hospital encounter of 10/25/17  Urinalysis, Routine w reflex microscopic  Result Value Ref Range   Color, Urine AMBER (A) YELLOW   APPearance CLOUDY (A) CLEAR   Specific Gravity, Urine 1.025 1.005 - 1.030   pH 6.0 5.0 - 8.0   Glucose, UA NEGATIVE NEGATIVE mg/dL   Hgb urine dipstick LARGE (A) NEGATIVE   Bilirubin Urine NEGATIVE NEGATIVE   Ketones, ur NEGATIVE NEGATIVE mg/dL   Protein, ur 960 (A) NEGATIVE mg/dL   Nitrite NEGATIVE NEGATIVE  Leukocytes, UA TRACE (A) NEGATIVE   RBC / HPF TOO NUMEROUS TO COUNT 0 - 5 RBC/hpf   WBC, UA TOO NUMEROUS TO COUNT 0 - 5 WBC/hpf   Bacteria, UA NONE SEEN NONE SEEN   Squamous Epithelial / LPF NONE SEEN NONE SEEN   WBC Clumps PRESENT    Mucus PRESENT    Budding Yeast PRESENT    Ca Oxalate Crys, UA PRESENT   Lipase, blood  Result Value Ref Range   Lipase 23 11 - 51 U/L  Comprehensive metabolic panel  Result Value Ref Range   Sodium 139 135 - 145 mmol/L   Potassium 3.8 3.5 - 5.1 mmol/L   Chloride 103 101 - 111 mmol/L   CO2 29 22 - 32 mmol/L   Glucose, Bld 88 65 - 99 mg/dL   BUN 11 6 - 20 mg/dL   Creatinine, Ser 1.61 0.61 - 1.24 mg/dL   Calcium 9.0 8.9 - 09.6 mg/dL   Total Protein 7.0 6.5 - 8.1 g/dL   Albumin 3.8 3.5 - 5.0 g/dL   AST 23 15 - 41 U/L   ALT 19 17 - 63 U/L   Alkaline Phosphatase 85 38 - 126 U/L   Total Bilirubin 0.4 0.3 - 1.2 mg/dL   GFR calc non Af Amer >60 >60 mL/min   GFR calc Af Amer >60 >60 mL/min   Anion gap 7 5 - 15  CBC with  Differential  Result Value Ref Range   WBC 6.0 4.0 - 10.5 K/uL   RBC 4.32 4.22 - 5.81 MIL/uL   Hemoglobin 13.1 13.0 - 17.0 g/dL   HCT 04.5 40.9 - 81.1 %   MCV 93.1 78.0 - 100.0 fL   MCH 30.3 26.0 - 34.0 pg   MCHC 32.6 30.0 - 36.0 g/dL   RDW 91.4 78.2 - 95.6 %   Platelets 281 150 - 400 K/uL   Neutrophils Relative % 41 %   Neutro Abs 2.5 1.7 - 7.7 K/uL   Lymphocytes Relative 42 %   Lymphs Abs 2.6 0.7 - 4.0 K/uL   Monocytes Relative 10 %   Monocytes Absolute 0.6 0.1 - 1.0 K/uL   Eosinophils Relative 6 %   Eosinophils Absolute 0.4 0.0 - 0.7 K/uL   Basophils Relative 1 %   Basophils Absolute 0.0 0.0 - 0.1 K/uL   Ct Renal Stone Study Result Date: 10/25/2017 CLINICAL DATA:  Hematuria for the past 1-2 days. EXAM: CT ABDOMEN AND PELVIS WITHOUT CONTRAST TECHNIQUE: Multidetector CT imaging of the abdomen and pelvis was performed following the standard protocol without IV contrast. COMPARISON:  CT abdomen pelvis dated October 13, 2015. FINDINGS: Lower chest: No acute abnormality. Hepatobiliary: No focal liver abnormality is seen. No gallstones, gallbladder wall thickening, or biliary dilatation. Pancreas: Unremarkable. No pancreatic ductal dilatation or surrounding inflammatory changes. Spleen: Normal in size without focal abnormality. Adrenals/Urinary Tract: The adrenal glands and kidneys are unremarkable. No renal or ureteral calculi. No hydronephrosis. Bladder is decompressed by a Foley catheter. Stomach/Bowel: Stomach is within normal limits. Appendix appears normal. No evidence of bowel wall thickening, distention, or inflammatory changes. Prominent stool throughout the colon. Vascular/Lymphatic: Aortic atherosclerosis. No enlarged abdominal or pelvic lymph nodes. Reproductive: Prostate is unremarkable. Other: No free fluid or pneumoperitoneum. Musculoskeletal: No acute or significant osseous findings. Prior L5-S1 posterior fusion with unchanged fracture of the left S1 screw. IMPRESSION: 1. No acute  intra-abdominal process. 2. Bladder decompressed by a appropriately positioned Foley catheter. No calculi or hydronephrosis. 3.  Aortic atherosclerosis (  ICD10-I70.0). 4. Unchanged L5-S1 posterior fusion with fracture of the left S1 screw. Electronically Signed   By: Obie Dredge M.D.   On: 10/25/2017 19:36    2045:  Foley irrigated, draining freely. Workup reassuring. UC obtained. Will tx empirically for UTI. Dx and testing d/w pt.  Questions answered.  Verb understanding, agreeable to d/c home with outpt f/u.    Final Clinical Impressions(s) / ED Diagnoses   Final diagnoses:  None    New Prescriptions New Prescriptions   No medications on file     Samuel Jester, DO 10/29/17 1324

## 2017-10-25 NOTE — ED Notes (Signed)
Attempted to call facility x2, no answer to give report, EMS informed.

## 2017-10-25 NOTE — Discharge Instructions (Signed)
Take the prescription as directed.  Call your regular medical doctor and the Urologist tomorrow to schedule a follow up appointment within the next 2 to 3 days.  Return to the Emergency Department immediately sooner if worsening.

## 2017-10-27 LAB — URINE CULTURE: CULTURE: NO GROWTH

## 2018-01-24 DIAGNOSIS — G8110 Spastic hemiplegia affecting unspecified side: Secondary | ICD-10-CM | POA: Diagnosis present

## 2018-06-06 ENCOUNTER — Other Ambulatory Visit: Payer: Self-pay | Admitting: Neurosurgery

## 2018-06-06 DIAGNOSIS — S32009K Unspecified fracture of unspecified lumbar vertebra, subsequent encounter for fracture with nonunion: Secondary | ICD-10-CM

## 2018-06-06 DIAGNOSIS — M5416 Radiculopathy, lumbar region: Secondary | ICD-10-CM

## 2018-06-29 ENCOUNTER — Other Ambulatory Visit: Payer: Commercial Managed Care - HMO

## 2018-07-06 ENCOUNTER — Ambulatory Visit
Admission: RE | Admit: 2018-07-06 | Discharge: 2018-07-06 | Disposition: A | Discharge status: Home / Self Care | Payer: Medicare HMO | Source: Ambulatory Visit | Attending: Neurosurgery | Admitting: Neurosurgery

## 2018-07-06 DIAGNOSIS — M5416 Radiculopathy, lumbar region: Secondary | ICD-10-CM

## 2018-07-06 DIAGNOSIS — S32009K Unspecified fracture of unspecified lumbar vertebra, subsequent encounter for fracture with nonunion: Secondary | ICD-10-CM

## 2018-07-06 MED ORDER — GADOBENATE DIMEGLUMINE 529 MG/ML IV SOLN
20.0000 mL | Freq: Once | INTRAVENOUS | Status: AC | PRN
  Administered 2018-07-06: 20 mL via INTRAVENOUS

## 2018-07-23 ENCOUNTER — Encounter: Payer: Self-pay | Admitting: Emergency Medicine

## 2018-07-23 ENCOUNTER — Emergency Department: Payer: Medicare HMO

## 2018-07-23 ENCOUNTER — Other Ambulatory Visit: Payer: Self-pay

## 2018-07-23 ENCOUNTER — Emergency Department
Admission: EM | Admit: 2018-07-23 | Discharge: 2018-07-23 | Disposition: A | Discharge status: Home / Self Care | Payer: Medicare HMO | Attending: Emergency Medicine | Admitting: Emergency Medicine

## 2018-07-23 DIAGNOSIS — Y929 Unspecified place or not applicable: Secondary | ICD-10-CM | POA: Diagnosis not present

## 2018-07-23 DIAGNOSIS — F17210 Nicotine dependence, cigarettes, uncomplicated: Secondary | ICD-10-CM | POA: Insufficient documentation

## 2018-07-23 DIAGNOSIS — Y999 Unspecified external cause status: Secondary | ICD-10-CM | POA: Diagnosis not present

## 2018-07-23 DIAGNOSIS — I69354 Hemiplegia and hemiparesis following cerebral infarction affecting left non-dominant side: Secondary | ICD-10-CM | POA: Diagnosis not present

## 2018-07-23 DIAGNOSIS — Z7901 Long term (current) use of anticoagulants: Secondary | ICD-10-CM | POA: Diagnosis not present

## 2018-07-23 DIAGNOSIS — S20212A Contusion of left front wall of thorax, initial encounter: Secondary | ICD-10-CM | POA: Diagnosis not present

## 2018-07-23 DIAGNOSIS — Z79899 Other long term (current) drug therapy: Secondary | ICD-10-CM | POA: Diagnosis not present

## 2018-07-23 DIAGNOSIS — Y939 Activity, unspecified: Secondary | ICD-10-CM | POA: Diagnosis not present

## 2018-07-23 DIAGNOSIS — I10 Essential (primary) hypertension: Secondary | ICD-10-CM | POA: Insufficient documentation

## 2018-07-23 DIAGNOSIS — W19XXXA Unspecified fall, initial encounter: Secondary | ICD-10-CM | POA: Insufficient documentation

## 2018-07-23 DIAGNOSIS — S299XXA Unspecified injury of thorax, initial encounter: Secondary | ICD-10-CM | POA: Diagnosis present

## 2018-07-23 MED ORDER — PREDNISONE 50 MG PO TABS: 50.0000 mg | ORAL_TABLET | Freq: Every day | ORAL | 0 refills | Status: DC

## 2018-07-23 MED ORDER — PREDNISONE 20 MG PO TABS
60.0000 mg | ORAL_TABLET | Freq: Once | ORAL | Status: AC
Start: 2018-07-23 — End: 2018-07-23
  Administered 2018-07-23: 60 mg via ORAL
  Filled 2018-07-23: qty 3

## 2018-07-23 MED ORDER — ONDANSETRON 8 MG PO TBDP
8.0000 mg | ORAL_TABLET | Freq: Once | ORAL | Status: AC
  Administered 2018-07-23: 8 mg via ORAL
  Filled 2018-07-23: qty 1

## 2018-07-23 MED ORDER — MORPHINE SULFATE (PF) 4 MG/ML IV SOLN
4.0000 mg | Freq: Once | INTRAVENOUS | Status: AC
  Administered 2018-07-23: 4 mg via INTRAMUSCULAR
  Filled 2018-07-23: qty 1

## 2018-07-23 NOTE — ED Notes (Signed)
Ice pack placed on pt's back where he c/o pain.  Water given to pt.  Assisted pt w/repositioning.

## 2018-07-23 NOTE — ED Triage Notes (Signed)
Fell against concrete yesterday hitting L chest, painful to take breath since.

## 2018-07-23 NOTE — ED Notes (Signed)
First Nurse Note:  Patient presents to the ED via EMS with left sided rib pain.  Patient fell yesterday.  Vitals stable and lung sounds equal per EMS.  Patient has history of stroke.

## 2018-07-23 NOTE — ED Provider Notes (Signed)
[facility] Emergency Department Provider Note  ____________________________________________  Time seen: Approximately 7:58 PM  I have reviewed the triage vital signs and the nursing notes.   HISTORY  Chief Complaint Rib Injury    HPI Mark Wells is a 47 y.o. male who presents the emergency department complaining of left  posterior lateral  rib pain after a fall.  Patient has a history of stroke with weakness on the left side.  Patient reports that his left leg gave out causing him to fall landing on his left rib cage.  This occurred yesterday.  Patient did not strike his head or lose consciousness.  Patient is complaining of sharp left rib pain.  He denies any difficulty breathing.  No visible abnormality.  No other complaints at this time.  Patient has a significant medical history including hemiplegia after stroke, hypertension, chronic pain syndrome, depression, anemia, anxiety.  Patient is denying any complaints with chronic medical problems at this time.   Past Medical History:  Diagnosis Date  . Anemia   . Anxiety   . Chronic back pain   . Chronic pain   . Depression   . Hemiplegia affecting left nondominant side (HCC)   . Hyperlipidemia   . Hypertension   . Mood disorder (HCC)   . Personality disorder (HCC)   . Restless leg syndrome   . Stroke [facility])     Patient Active Problem List   Diagnosis Date Noted  . BACK PAIN 01/07/2011  . ADVERSE DRUG REACTION 01/01/2011  . UNSPECIFIED ESSENTIAL HYPERTENSION 12/28/2010  . LOW BACK PAIN SYNDROME, SEVERE 12/28/2010  . OTHER URINARY INCONTINENCE 12/28/2010    Past Surgical History:  Procedure Laterality Date  . BACK SURGERY      Prior to Admission medications   Medication Sig Start Date End Date Taking? Authorizing Provider  atorvastatin (LIPITOR) 40 MG tablet Take 40 mg by mouth every evening.    [provider]  cephALEXin (KEFLEX) 500 MG capsule Take 1 capsule (500 mg total) by  mouth 4 (four) times daily. 10/25/17   Samuel Jester, DO  clopidogrel (PLAVIX) 75 MG tablet Take 75 mg by mouth daily.    [provider]  gabapentin (NEURONTIN) 800 MG tablet Take 800 mg by mouth 3 (three) times daily.    [provider]  lisinopril (PRINIVIL,ZESTRIL) 40 MG tablet Take 40 mg by mouth daily.      [provider]  methadone (DOLOPHINE) 1 MG/1ML solution Take 10 mg by mouth every 12 (twelve) hours.    [provider]  oxyCODONE-acetaminophen (PERCOCET) 7.5-325 MG tablet Take 1 tablet by mouth every 6 (six) hours as needed for severe pain. 04/22/17   Joni Reining, PA-C  predniSONE (DELTASONE) 50 MG tablet Take 1 tablet (50 mg total) by mouth daily with breakfast. 07/23/18   Jakaiya Netherland, Delorise Royals, PA-C  pregabalin (LYRICA) 300 MG capsule Take 300 mg by mouth 2 (two) times daily.     [provider]  sertraline (ZOLOFT) 100 MG tablet Take 100 mg by mouth 2 (two) times daily.     [provider]  traMADol (ULTRAM) 50 MG tablet Take 50 mg by mouth every 8 (eight) hours as needed for moderate pain.    [provider]  tuberculin 5 UNIT/0.1ML injection Inject 0.1 mLs into the skin once.    [provider]  zolpidem (AMBIEN) 10 MG tablet Take 10 mg by mouth at bedtime.    [provider]  Allergies Aspirin; Ibuprofen; Iodine; and Shellfish allergy  No family history on file.  Social History Social History   Tobacco Use  . Smoking status: Current Every Day Smoker    Packs/day: 0.50    Types: Cigarettes  . Smokeless tobacco: Never Used  Substance Use Topics  . Alcohol use: No  . Drug use: No     Review of Systems  Constitutional: No fever/chills Eyes: No visual changes. No discharge ENT: No upper respiratory complaints. Cardiovascular: no chest pain. Respiratory: no cough. No SOB. Gastrointestinal: No abdominal pain.  No nausea, no vomiting.  No diarrhea.  No  constipation. Genitourinary: Negative for dysuria. No hematuria Musculoskeletal: Positive for left rib pain Skin: Negative for rash, abrasions, lacerations, ecchymosis. Neurological: Negative for headaches, focal weakness or numbness. 10-point ROS otherwise negative.  ____________________________________________   PHYSICAL EXAM:  VITAL SIGNS: ED Triage Vitals  Enc Vitals Group     BP 07/23/18 1840 117/76     Pulse Rate 07/23/18 1840 73     Resp 07/23/18 1844 18     Temp 07/23/18 1840 98.8 F (37.1 C)     Temp Source 07/23/18 1840 Oral     SpO2 07/23/18 1840 97 %     Weight 07/23/18 1842 200 lb (90.7 kg)     Height 07/23/18 1842 5\' 6"  (1.676 m)     Head Circumference --      Peak Flow --      Pain Score 07/23/18 1844 10     Pain Loc --      Pain Edu? --      Excl. in GC? --      Constitutional: Alert and oriented. Well appearing and in no acute distress. Eyes: Conjunctivae are normal. PERRL. EOMI. Head: Atraumatic. Neck: No stridor.  No cervical spine tenderness to palpation.  Cardiovascular: Normal rate, regular rhythm. Normal S1 and S2.  Good peripheral circulation. Respiratory: Normal respiratory effort without tachypnea or retractions. Lungs CTAB. Good air entry to the bases with no decreased or absent breath sounds. Gastrointestinal: Bowel sounds 4 quadrants. Soft and nontender to palpation. No guarding or rigidity. No palpable masses. No distention. No CVA tenderness. Musculoskeletal: Full range of motion to all extremities. No gross deformities appreciated.  Visualization of the left ribs reveals no visible deformity or abnormality.  Patient has diffusely tender throughout the posterior lateral ribs with no point specific tenderness.  No crepitus or subcutaneous emphysema.  No paradoxical chest wall movement.  Good underlying breath sounds bilaterally. Neurologic:  Normal speech and language. No gross focal neurologic deficits are appreciated.  Skin:  Skin is warm,  dry and intact. No rash noted. Psychiatric: Mood and affect are normal. Speech and behavior are normal. Patient exhibits appropriate insight and judgement.   ____________________________________________   LABS (all labs ordered are listed, but only abnormal results are displayed)  Labs Reviewed - No data to display ____________________________________________  EKG   ____________________________________________  RADIOLOGY I personally viewed and evaluated these images as part of my medical decision making, as well as reviewing the written report by the radiologist.  Concur with radiologist finding of no acute rib fractures.  Dg Ribs Unilateral W/chest Left  Result Date: 07/23/2018 CLINICAL DATA:  Fall, left chest pain EXAM: LEFT RIBS AND CHEST - 3+ VIEW COMPARISON:  Chest radiographs dated 08/20/2013 FINDINGS: Lungs are clear.  No pleural effusion or pneumothorax. The heart is normal in size. No displaced left rib fracture is seen. IMPRESSION: No evidence of acute cardiopulmonary disease.  No displaced left rib fracture is seen. Electronically Signed   By: Charline BillsSriyesh  Krishnan M.D.   On: 07/23/2018 19:25    ____________________________________________    PROCEDURES  Procedure(s) performed:    Procedures    Medications  morphine 4 MG/ML injection 4 mg (has no administration in time range)  ondansetron (ZOFRAN-ODT) disintegrating tablet 8 mg (has no administration in time range)  predniSONE (DELTASONE) tablet 60 mg (has no administration in time range)     ____________________________________________   INITIAL IMPRESSION / ASSESSMENT AND PLAN / ED COURSE  Pertinent labs & imaging results that were available during my care of the patient were reviewed by me and considered in my medical decision making (see chart for details).  Review of the [place] CSRS was performed in accordance of the [facility] prior to dispensing any controlled drugs.      Patient's diagnosis is consistent  with rib contusion.  Patient presents the emergency department complaining of left rib pain after a fall yesterday.  Exam was overall reassuring.  X-ray reveals no acute osseous abnormality.  Patient has of substantial narcotic history of the controlled substance database.  Patient did ask for narcotics for discharge.  Patient still has prescribed narcotics and was advised that he would not receive any for discharge.  Patient had repeatedly asked for pain medicine became agitated and told no.  At this time, patient is instructed that he may receive a single shot of pain medicine but he will not be prescribed any narcotics.  Patient is agreeable to this plan.  Patient is given injection of morphine, oral steroids prior to discharge.. Patient will be discharged home with prescriptions for 5-day dose of prednisone for inflammation control from contusion of the left ribs. Patient is to follow up with primary care as needed or otherwise directed. Patient is given ED precautions to return to the ED for any worsening or new symptoms.     ____________________________________________  FINAL CLINICAL IMPRESSION(S) / ED DIAGNOSES  Final diagnoses:  Contusion of rib on left side, initial encounter      NEW MEDICATIONS STARTED DURING THIS VISIT:  ED Discharge Orders        Ordered    predniSONE (DELTASONE) 50 MG tablet  Daily with breakfast     07/23/18 2012          This chart was dictated using voice recognition software/Dragon. Despite best efforts to proofread, errors can occur which can change the meaning. Any change was purely unintentional.    Racheal PatchesCuthriell, Anakin Varkey D, PA-C 07/23/18 2014    Don PerkingVeronese, [place], MD 08/02/18 [phone]

## 2018-07-30 ENCOUNTER — Encounter: Payer: Self-pay | Admitting: Emergency Medicine

## 2018-07-30 ENCOUNTER — Other Ambulatory Visit: Payer: Self-pay

## 2018-07-30 ENCOUNTER — Emergency Department: Payer: Medicare HMO

## 2018-07-30 ENCOUNTER — Emergency Department
Admission: EM | Admit: 2018-07-30 | Discharge: 2018-07-30 | Disposition: A | Discharge status: Home / Self Care | Payer: Medicare HMO | Attending: Emergency Medicine | Admitting: Emergency Medicine

## 2018-07-30 DIAGNOSIS — S4992XA Unspecified injury of left shoulder and upper arm, initial encounter: Secondary | ICD-10-CM | POA: Diagnosis not present

## 2018-07-30 DIAGNOSIS — F17210 Nicotine dependence, cigarettes, uncomplicated: Secondary | ICD-10-CM | POA: Insufficient documentation

## 2018-07-30 DIAGNOSIS — Y92003 Bedroom of unspecified non-institutional (private) residence as the place of occurrence of the external cause: Secondary | ICD-10-CM | POA: Insufficient documentation

## 2018-07-30 DIAGNOSIS — W06XXXA Fall from bed, initial encounter: Secondary | ICD-10-CM | POA: Diagnosis not present

## 2018-07-30 DIAGNOSIS — Y999 Unspecified external cause status: Secondary | ICD-10-CM | POA: Insufficient documentation

## 2018-07-30 DIAGNOSIS — Z79899 Other long term (current) drug therapy: Secondary | ICD-10-CM | POA: Insufficient documentation

## 2018-07-30 DIAGNOSIS — I10 Essential (primary) hypertension: Secondary | ICD-10-CM | POA: Insufficient documentation

## 2018-07-30 DIAGNOSIS — Y939 Activity, unspecified: Secondary | ICD-10-CM | POA: Diagnosis not present

## 2018-07-30 DIAGNOSIS — Z7901 Long term (current) use of anticoagulants: Secondary | ICD-10-CM | POA: Insufficient documentation

## 2018-07-30 MED ORDER — OXYCODONE-ACETAMINOPHEN 5-325 MG PO TABS
1.0000 | ORAL_TABLET | Freq: Once | ORAL | Status: AC
  Administered 2018-07-30: 1 via ORAL
  Filled 2018-07-30: qty 1

## 2018-07-30 NOTE — ED Notes (Signed)
Patient discharged to home per MD order. Patient in stable condition, and deemed medically cleared by ED provider for discharge. Discharge instructions reviewed with patient/family using "Teach Back"; verbalized understanding of medication education and administration, and information about follow-up care. Denies further concerns. ° °

## 2018-07-30 NOTE — ED Triage Notes (Signed)
Patient reports on Saturday, he fell and landed on his left shoulder and since he has not been able to left his arm. Patient able to move fingers. Good cap refill and radial pulses felt.

## 2018-07-30 NOTE — ED Notes (Addendum)
See triage note states he fell on Saturday  Landed on left shoulder questionable deformity   Having increased pain with movement  Good pulses

## 2018-07-30 NOTE — Discharge Instructions (Addendum)
Follow-up with Dr. Rosita KeaMenz.  Please call his office for an appointment.  Apply ice to the shoulder 3 times daily.  Keep your arm in the shoulder immobilizer except for showering until you see Dr. Rosita KeaMenz.  Tell his office that we talked with him.  That you were seen in the ER.  And  Dr. Rosita KeaMenz wants to see you.

## 2018-07-30 NOTE — ED Provider Notes (Signed)
[facility] Emergency Department Provider Note  ____________________________________________   First MD Initiated Contact with Patient 07/30/18 1701     (approximate)  I have reviewed the triage vital signs and the nursing notes.   HISTORY  Chief Complaint Shoulder Injury    HPI Mark Wells is a 47 y.o. male presents to the emergency department complaining of left shoulder pain.  He states that he was having night terrors and fell out of the bed onto his left side.  He states he actually walks with a walking cane.  He states that he is been unable to move his left shoulder since Saturday.  He cannot lift his arm.  He states the pain has been severe.  He states his brother told him it would like his left shoulder was dislocated.  He denies numbness or tingling or any new stroke symptoms.    Past Medical History:  Diagnosis Date  . Anemia   . Anxiety   . Chronic back pain   . Chronic pain   . Depression   . Hemiplegia affecting left nondominant side (HCC)   . Hyperlipidemia   . Hypertension   . Mood disorder (HCC)   . Personality disorder (HCC)   . Restless leg syndrome   . Stroke [facility](HCC)     Patient Active Problem List   Diagnosis Date Noted  . BACK PAIN 01/07/2011  . ADVERSE DRUG REACTION 01/01/2011  . UNSPECIFIED ESSENTIAL HYPERTENSION 12/28/2010  . LOW BACK PAIN SYNDROME, SEVERE 12/28/2010  . OTHER URINARY INCONTINENCE 12/28/2010    Past Surgical History:  Procedure Laterality Date  . BACK SURGERY      Prior to Admission medications   Medication Sig Start Date End Date Taking? Authorizing Provider  atorvastatin (LIPITOR) 40 MG tablet Take 40 mg by mouth every evening.    [provider]  clopidogrel (PLAVIX) 75 MG tablet Take 75 mg by mouth daily.    [provider]  gabapentin (NEURONTIN) 800 MG tablet Take 800 mg by mouth 3 (three) times daily.    [provider]  lisinopril (PRINIVIL,ZESTRIL) 40 MG  tablet Take 40 mg by mouth daily.      [provider]  methadone (DOLOPHINE) 1 MG/1ML solution Take 10 mg by mouth every 12 (twelve) hours.    [provider]  oxyCODONE-acetaminophen (PERCOCET) 7.5-325 MG tablet Take 1 tablet by mouth every 6 (six) hours as needed for severe pain. 04/22/17   Joni ReiningSmith, Ronald K, PA-C  predniSONE (DELTASONE) 50 MG tablet Take 1 tablet (50 mg total) by mouth daily with breakfast. 07/23/18   Cuthriell, Delorise RoyalsJonathan D, PA-C  pregabalin (LYRICA) 300 MG capsule Take 300 mg by mouth 2 (two) times daily.     [provider]  sertraline (ZOLOFT) 100 MG tablet Take 100 mg by mouth 2 (two) times daily.     [provider]  traMADol (ULTRAM) 50 MG tablet Take 50 mg by mouth every 8 (eight) hours as needed for moderate pain.    [provider]  tuberculin 5 UNIT/0.1ML injection Inject 0.1 mLs into the skin once.    [provider]  zolpidem (AMBIEN) 10 MG tablet Take 10 mg by mouth at bedtime.    [provider]    Allergies Aspirin; Ibuprofen; Iodine; and Shellfish allergy  No family history on file.  Social History Social History   Tobacco Use  . Smoking status: Current Every Day Smoker    Packs/day: 0.50    Types:  Cigarettes  . Smokeless tobacco: Never Used  Substance Use Topics  . Alcohol use: No  . Drug use: No    Review of Systems  Constitutional: No fever/chills Eyes: No visual changes. ENT: No sore throat. Respiratory: Denies cough Genitourinary: Negative for dysuria. Musculoskeletal: Negative for back pain.  Positive for left shoulder pain Skin: Negative for rash.    ____________________________________________   PHYSICAL EXAM:  VITAL SIGNS: ED Triage Vitals  Enc Vitals Group     BP 07/30/18 1641 (!) 120/94     Pulse Rate 07/30/18 1641 85     Resp 07/30/18 1641 18     Temp 07/30/18 1641 98.2 F (36.8 C)     Temp Source 07/30/18 1641 Oral     SpO2 07/30/18 1641 97 %     Weight  07/30/18 1642 198 lb (89.8 kg)     Height 07/30/18 1642 5\' 6"  (1.676 m)     Head Circumference --      Peak Flow --      Pain Score 07/30/18 1642 10     Pain Loc --      Pain Edu? --      Excl. in GC? --     Constitutional: Alert and oriented. Well appearing and in no acute distress. Eyes: Conjunctivae are normal.  Head: Atraumatic. Nose: No congestion/rhinnorhea. Mouth/Throat: Mucous membranes are moist.   Neck:  supple no lymphadenopathy noted Cardiovascular: Normal rate, regular rhythm.  Respiratory: Normal respiratory effort.  No retractions,  GU: deferred Musculoskeletal: Decreased range of motion of the left shoulder.  There seems to be a gap between the humeral head and the top part of the shoulder.  Patient has decreased range of motion Neurologic:  Normal speech and language.  Skin:  Skin is warm, dry and intact. No rash noted. Psychiatric: Mood and affect are normal. Speech and behavior are normal.  ____________________________________________   LABS (all labs ordered are listed, but only abnormal results are displayed)  Labs Reviewed - No data to display ____________________________________________   ____________________________________________  RADIOLOGY  X-ray of the left shoulder is negative Chest x-ray AP appears normal  ____________________________________________   PROCEDURES  Procedure(s) performed: shoulder immobilizer applied by tech  Procedures    ____________________________________________   INITIAL IMPRESSION / ASSESSMENT AND PLAN / ED COURSE  Pertinent labs & imaging results that were available during my care of the patient were reviewed by me and considered in my medical decision making (see chart for details).   Patient is 47 year old male presents emergency department complaining of left shoulder pain.  He states he fell out of the bed on Saturday during night tears and has had shoulder pain since.  His brother states that it looks  like it is dislocated.  On physical exam the patient does have some drooping in the left shoulder.  There is a gap between the humeral head and the top of the shoulder.  Patient is unable to move his shoulder.  Some deficits are from his stroke but summer from musculoskeletal.  X-ray of left shoulder is negative, chest x-ray is negative  Discussed the case with Dr. Rosita Kea.  He states that possibly could be the deltoid muscle as people that have had strokes tend to have a weak muscle and it would give you a positive sulcus sign.  He instructed me to place the patient in a shoulder immobilizer and have him follow-up with him in the office.  Explained all of this to the patient.  He  is to follow-up with Dr. Rosita Kea.  He is to wear the shoulder immobilizer as instructed.  He was given 1 dose of pain medication here.  Patient is on a pain contract so he was not given a prescription.  He is to apply ice at least 3 times a day for 30 minutes.  He states he understands and will comply.  He was discharged in stable condition.     As part of my medical decision making, I reviewed the following data within the electronic MEDICAL RECORD NUMBER Nursing notes reviewed and incorporated, Old chart reviewed, Radiograph reviewed left shoulder x-ray is negative for any acute abnormality, chest x-ray is normal, A consult was requested and obtained from this/these consultant(s) Orthopedics, Notes from prior ED visits and [place] Controlled Substance Database  ____________________________________________   FINAL CLINICAL IMPRESSION(S) / ED DIAGNOSES  Final diagnoses:  Shoulder injury, left, initial encounter      NEW MEDICATIONS STARTED DURING THIS VISIT:  Current Discharge Medication List       Note:  This document was prepared using Dragon voice recognition software and may include unintentional dictation errors.    Faythe Ghee, PA-C 07/30/18 Jefferey Pica, MD 07/30/18 [id number]

## 2018-08-10 ENCOUNTER — Other Ambulatory Visit: Payer: Self-pay | Admitting: Neurosurgery

## 2018-08-13 ENCOUNTER — Other Ambulatory Visit: Payer: Self-pay

## 2018-08-13 ENCOUNTER — Encounter: Payer: Self-pay | Admitting: Emergency Medicine

## 2018-08-13 ENCOUNTER — Emergency Department
Admission: EM | Admit: 2018-08-13 | Discharge: 2018-08-13 | Disposition: A | Discharge status: Home / Self Care | Payer: Medicare HMO | Attending: Emergency Medicine | Admitting: Emergency Medicine

## 2018-08-13 DIAGNOSIS — M79605 Pain in left leg: Secondary | ICD-10-CM | POA: Diagnosis not present

## 2018-08-13 DIAGNOSIS — Z79899 Other long term (current) drug therapy: Secondary | ICD-10-CM | POA: Diagnosis not present

## 2018-08-13 DIAGNOSIS — F17210 Nicotine dependence, cigarettes, uncomplicated: Secondary | ICD-10-CM | POA: Diagnosis not present

## 2018-08-13 DIAGNOSIS — Z7902 Long term (current) use of antithrombotics/antiplatelets: Secondary | ICD-10-CM | POA: Insufficient documentation

## 2018-08-13 DIAGNOSIS — I10 Essential (primary) hypertension: Secondary | ICD-10-CM | POA: Insufficient documentation

## 2018-08-13 MED ORDER — OXYCODONE-ACETAMINOPHEN 5-325 MG PO TABS
1.0000 | ORAL_TABLET | Freq: Once | ORAL | Status: AC
  Administered 2018-08-13: 1 via ORAL
  Filled 2018-08-13: qty 1

## 2018-08-13 NOTE — ED Notes (Signed)
Pt uses a modified cane to assist him w/ ambulation. Pt was dropped off at ED and will have to call for a ride. Pt has not seen his PCP regarding this pain and the muscle spasms.

## 2018-08-13 NOTE — ED Triage Notes (Signed)
C/O muscle spasms to left leg. Onset of symptoms this morning at 0130.

## 2018-08-13 NOTE — Discharge Instructions (Addendum)
Follow-up with treating doctor for refill of pain medication.

## 2018-08-13 NOTE — ED Provider Notes (Signed)
[facility] Emergency Department Provider Note   ____________________________________________   First MD Initiated Contact with Patient 08/13/18 1530     (approximate)  I have reviewed the triage vital signs and the nursing notes.   HISTORY  Chief Complaint Leg Pain    HPI Mark Wells is a 47 y.o. male patient complain of muscle spasm of left leg.  Patient ongoing problem for the last 3 months.  Patient states cramping does not resolve with different muscle relaxants and steroids.  Patient pain increased earlier this morning.  Patient stated right leg locks in the flex position.  Patient is ambulate with support of a walker secondary to degenerative changes in the lumbar spine.  Patient has internal fixation of the lumbar spine that scheduled for removal in 3 weeks.  Patient is on chronic narcotic pain medication with last prescription filled 07/20/2018 for 30-day supply.  Patient state he is out of pain medications.  Patient is requesting refill until he have surgery.  Patient denies bladder bowel dysfunction.  Patient is a radicular component affecting the left lower extremity.  Patient rates his pain/cramping as a 10/10.   Past Medical History:  Diagnosis Date  . Anemia   . Anxiety   . Chronic back pain   . Chronic pain   . Depression   . Hemiplegia affecting left nondominant side (HCC)   . Hyperlipidemia   . Hypertension   . Mood disorder (HCC)   . Personality disorder (HCC)   . Restless leg syndrome   . Stroke [facility])     Patient Active Problem List   Diagnosis Date Noted  . BACK PAIN 01/07/2011  . ADVERSE DRUG REACTION 01/01/2011  . UNSPECIFIED ESSENTIAL HYPERTENSION 12/28/2010  . LOW BACK PAIN SYNDROME, SEVERE 12/28/2010  . OTHER URINARY INCONTINENCE 12/28/2010    Past Surgical History:  Procedure Laterality Date  . BACK SURGERY      Prior to Admission medications   Medication Sig Start Date End Date Taking? Authorizing Provider    atorvastatin (LIPITOR) 40 MG tablet Take 40 mg by mouth every evening.    [provider]  clopidogrel (PLAVIX) 75 MG tablet Take 75 mg by mouth daily.    [provider]  gabapentin (NEURONTIN) 800 MG tablet Take 800 mg by mouth 3 (three) times daily.    [provider]  lisinopril (PRINIVIL,ZESTRIL) 40 MG tablet Take 40 mg by mouth daily.      [provider]  methadone (DOLOPHINE) 1 MG/1ML solution Take 10 mg by mouth every 12 (twelve) hours.    [provider]  oxyCODONE-acetaminophen (PERCOCET) 7.5-325 MG tablet Take 1 tablet by mouth every 6 (six) hours as needed for severe pain. 04/22/17   Joni Reining, PA-C  predniSONE (DELTASONE) 50 MG tablet Take 1 tablet (50 mg total) by mouth daily with breakfast. 07/23/18   Cuthriell, Delorise Royals, PA-C  pregabalin (LYRICA) 300 MG capsule Take 300 mg by mouth 2 (two) times daily.     [provider]  sertraline (ZOLOFT) 100 MG tablet Take 100 mg by mouth 2 (two) times daily.     [provider]  traMADol (ULTRAM) 50 MG tablet Take 50 mg by mouth every 8 (eight) hours as needed for moderate pain.    [provider]  tuberculin 5 UNIT/0.1ML injection Inject 0.1 mLs into the skin once.    [provider]  zolpidem (AMBIEN) 10 MG tablet Take 10 mg by mouth at bedtime.  [provider]    Allergies Aspirin; Ibuprofen; Iodine; and Shellfish allergy  No family history on file.  Social History Social History   Tobacco Use  . Smoking status: Current Every Day Smoker    Packs/day: 0.50    Types: Cigarettes  . Smokeless tobacco: Never Used  Substance Use Topics  . Alcohol use: No  . Drug use: No    Review of Systems Constitutional: No fever/chills Eyes: No visual changes. ENT: No sore throat. Cardiovascular: Denies chest pain. Respiratory: Denies shortness of breath. Gastrointestinal: No abdominal pain.  No nausea, no vomiting.  No diarrhea.  No  constipation. Genitourinary: Negative for dysuria. Musculoskeletal: Positive for tonic back pain.  Left leg cramping. Skin: Negative for rash. Neurological: Negative for headaches, focal weakness or numbness. Psychiatric:Anxiety and depression Endocrine:Hyperlipidemia and hypertension Hematological/Lymphatic: Allergic/Immunilogical: Aspirins, ibuprofen, iodine, and shellfish. ____________________________________________   PHYSICAL EXAM:  VITAL SIGNS: ED Triage Vitals  Enc Vitals Group     BP 08/13/18 1528 (!) 118/93     Pulse Rate 08/13/18 1528 98     Resp 08/13/18 1528 16     Temp 08/13/18 1528 99 F (37.2 C)     Temp Source 08/13/18 1528 Oral     SpO2 08/13/18 1528 98 %     Weight 08/13/18 1525 198 lb (89.8 kg)     Height 08/13/18 1525 5\' 6"  (1.676 m)     Head Circumference --      Peak Flow --      Pain Score 08/13/18 1525 10     Pain Loc --      Pain Edu? --      Excl. in GC? --    Constitutional: Alert and oriented. Well appearing and in no acute distress. Neck: No stridor.  No cervical spine tenderness to palpation. Hematological/Lymphatic/Immunilogical: No cervical lymphadenopathy. Cardiovascular: Normal rate, regular rhythm. Grossly normal heart sounds.  Good peripheral circulation. Respiratory: Normal respiratory effort.  No retractions. Lungs CTAB. Musculoskeletal: No lower extremity tenderness nor edema.  Patient unable to reproduce the locking mechanism in the left lower extremity.  No joint effusions. Neurologic:  Normal speech and language. No gross focal neurologic deficits are appreciated. No gait instability. Skin:  Skin is warm, dry and intact. No rash noted. Psychiatric: Mood and affect are normal. Speech and behavior are normal.  ____________________________________________   LABS (all labs ordered are listed, but only abnormal results are displayed)  Labs Reviewed - No data to  display ____________________________________________  EKG   ____________________________________________  RADIOLOGY  ED MD interpretation:    Official radiology report(s): No results found.  ____________________________________________   PROCEDURES  Procedure(s) performed: None  Procedures  Critical Care performed: No  ____________________________________________   INITIAL IMPRESSION / ASSESSMENT AND PLAN / ED COURSE  As part of my medical decision making, I reviewed the following data within the electronic MEDICAL RECORD NUMBER    Patient presented with complaint of muscle spasm to the left lower leg.  Physical exam was unable to produce spasms or locking sensation in the left lower extremity.  Reviewed narcotic usage with patient and advised him that he must see his treating doctor for refill of pain medication.      ____________________________________________   FINAL CLINICAL IMPRESSION(S) / ED DIAGNOSES  Final diagnoses:  Left leg pain     ED Discharge Orders    None       Note:  This document was prepared using Dragon voice recognition software and may include unintentional dictation  errors.    Joni ReiningSmith, Natilee Gauer K, PA-C 08/13/18 1636    Minna AntisPaduchowski, Kevin, MD 08/14/18 [id number]

## 2018-08-16 ENCOUNTER — Other Ambulatory Visit: Payer: Self-pay

## 2018-08-16 ENCOUNTER — Emergency Department
Admission: EM | Admit: 2018-08-16 | Discharge: 2018-08-16 | Disposition: A | Discharge status: Home / Self Care | Payer: Medicare HMO | Attending: Student in an Organized Health Care Education/Training Program | Admitting: Student in an Organized Health Care Education/Training Program

## 2018-08-16 ENCOUNTER — Emergency Department: Payer: Medicare HMO

## 2018-08-16 DIAGNOSIS — M79605 Pain in left leg: Secondary | ICD-10-CM | POA: Insufficient documentation

## 2018-08-16 DIAGNOSIS — Z79899 Other long term (current) drug therapy: Secondary | ICD-10-CM | POA: Insufficient documentation

## 2018-08-16 DIAGNOSIS — M6281 Muscle weakness (generalized): Secondary | ICD-10-CM | POA: Insufficient documentation

## 2018-08-16 DIAGNOSIS — I10 Essential (primary) hypertension: Secondary | ICD-10-CM | POA: Insufficient documentation

## 2018-08-16 DIAGNOSIS — Z7901 Long term (current) use of anticoagulants: Secondary | ICD-10-CM | POA: Insufficient documentation

## 2018-08-16 DIAGNOSIS — G8194 Hemiplegia, unspecified affecting left nondominant side: Secondary | ICD-10-CM | POA: Diagnosis not present

## 2018-08-16 DIAGNOSIS — R29898 Other symptoms and signs involving the musculoskeletal system: Secondary | ICD-10-CM

## 2018-08-16 DIAGNOSIS — F17210 Nicotine dependence, cigarettes, uncomplicated: Secondary | ICD-10-CM | POA: Insufficient documentation

## 2018-08-16 MED ORDER — OXYCODONE-ACETAMINOPHEN 5-325 MG PO TABS
1.0000 | ORAL_TABLET | Freq: Once | ORAL | Status: AC
  Administered 2018-08-16: 1 via ORAL
  Filled 2018-08-16: qty 1

## 2018-08-16 NOTE — ED Notes (Signed)
Pt stated that he was sleepy and requested to take a nap in triage

## 2018-08-16 NOTE — ED Triage Notes (Signed)
Pt c/o left leg "locking in place" and lower back pain - (pt was in lobby sound asleep and had to be shaken repeatedly to arouse him)

## 2018-08-16 NOTE — ED Notes (Signed)
See triage note  Presents with left leg locking up on him  States this has happened in past  Denies any recent injury

## 2018-08-16 NOTE — Discharge Instructions (Signed)
Follow-up with orthopedics.  You probably need physical therapy to help strengthening the left leg due to the previous stroke.  We are unable to give you a prescription for pain medication but he will be given one here in the emergency department.  Follow-up with orthopedics as instructed.

## 2018-08-16 NOTE — ED Provider Notes (Signed)
[facility] Emergency Department Provider Note  ____________________________________________   First MD Initiated Contact with Patient 08/16/18 1611     (approximate)  I have reviewed the triage vital signs and the nursing notes.   HISTORY  Chief Complaint Back Pain and Leg Pain    HPI Mark Wells is a 47 y.o. male presents emergency department complaining of his left leg locking up on him.  He states it has happened in the past and keeps happening now.  He does have weakness on the left side due to her previous stroke.  He has not followed up with orthopedics for other injuries that we have instructed him to follow-up for.  He is requesting pain medication as his first complaint.  He denies headache, chest pain, shortness of breath, or swelling in the lower leg.    Past Medical History:  Diagnosis Date  . Anemia   . Anxiety   . Chronic back pain   . Chronic pain   . Depression   . Hemiplegia affecting left nondominant side (HCC)   . Hyperlipidemia   . Hypertension   . Mood disorder (HCC)   . Personality disorder (HCC)   . Restless leg syndrome   . Stroke [facility])     Patient Active Problem List   Diagnosis Date Noted  . BACK PAIN 01/07/2011  . ADVERSE DRUG REACTION 01/01/2011  . UNSPECIFIED ESSENTIAL HYPERTENSION 12/28/2010  . LOW BACK PAIN SYNDROME, SEVERE 12/28/2010  . OTHER URINARY INCONTINENCE 12/28/2010    Past Surgical History:  Procedure Laterality Date  . BACK SURGERY      Prior to Admission medications   Medication Sig Start Date End Date Taking? Authorizing Provider  atorvastatin (LIPITOR) 40 MG tablet Take 40 mg by mouth every evening.    [provider]  clopidogrel (PLAVIX) 75 MG tablet Take 75 mg by mouth daily.    [provider]  gabapentin (NEURONTIN) 800 MG tablet Take 800 mg by mouth 3 (three) times daily.    [provider]  lisinopril (PRINIVIL,ZESTRIL) 40 MG tablet Take 40 mg by mouth  daily.      [provider]  methadone (DOLOPHINE) 1 MG/1ML solution Take 10 mg by mouth every 12 (twelve) hours.    [provider]  oxyCODONE-acetaminophen (PERCOCET) 7.5-325 MG tablet Take 1 tablet by mouth every 6 (six) hours as needed for severe pain. 04/22/17   Joni Reining, PA-C  predniSONE (DELTASONE) 50 MG tablet Take 1 tablet (50 mg total) by mouth daily with breakfast. 07/23/18   Cuthriell, Delorise Royals, PA-C  pregabalin (LYRICA) 300 MG capsule Take 300 mg by mouth 2 (two) times daily.     [provider]  sertraline (ZOLOFT) 100 MG tablet Take 100 mg by mouth 2 (two) times daily.     [provider]  traMADol (ULTRAM) 50 MG tablet Take 50 mg by mouth every 8 (eight) hours as needed for moderate pain.    [provider]  tuberculin 5 UNIT/0.1ML injection Inject 0.1 mLs into the skin once.    [provider]  zolpidem (AMBIEN) 10 MG tablet Take 10 mg by mouth at bedtime.    [provider]    Allergies Aspirin; Ibuprofen; Iodine; and Shellfish allergy  No family history on file.  Social History Social History   Tobacco Use  . Smoking status: Current Every Day Smoker    Packs/day: 0.50    Types: Cigarettes  . Smokeless tobacco: Never Used  Substance Use Topics  . Alcohol use: No  . Drug use: No    Review of Systems  Constitutional: No fever/chills Eyes: No visual changes. ENT: No sore throat. Respiratory: Denies cough Genitourinary: Negative for dysuria. Musculoskeletal: Negative for back pain.  Left leg keeps locking up on him Skin: Negative for rash.    ____________________________________________   PHYSICAL EXAM:  VITAL SIGNS: ED Triage Vitals  Enc Vitals Group     BP 08/16/18 1552 130/89     Pulse Rate 08/16/18 1552 78     Resp 08/16/18 1552 14     Temp 08/16/18 1552 98 F (36.7 C)     Temp Source 08/16/18 1552 Oral     SpO2 08/16/18 1552 98 %     Weight 08/16/18 1550 199 lb (90.3 kg)      Height 08/16/18 1550 5\' 6"  (1.676 m)     Head Circumference --      Peak Flow --      Pain Score 08/16/18 1550 10     Pain Loc --      Pain Edu? --      Excl. in GC? --     Constitutional: Alert and oriented. Well appearing and in no acute distress.  Patient sleeping on the stretcher with his headphones on.  I had to shake him to wake him up Eyes: Conjunctivae are normal.  Head: Atraumatic. Nose: No congestion/rhinnorhea. Mouth/Throat: Mucous membranes are moist.   Neck:  supple no lymphadenopathy noted Cardiovascular: Normal rate, regular rhythm. Heart sounds are normal Respiratory: Normal respiratory effort.  No retractions, lungs c t a  GU: deferred Musculoskeletal: FROM all extremities, warm and well perfused.  There is muscle wasting noted in the left leg.  However this is the stroke affected side.  The joint line is minimally tender.  He is neurovascularly intact. Neurologic:  Normal speech and language.  Skin:  Skin is warm, dry and intact. No rash noted. Psychiatric: Mood and affect are normal. Speech and behavior are normal.  ____________________________________________   LABS (all labs ordered are listed, but only abnormal results are displayed)  Labs Reviewed - No data to display ____________________________________________   ____________________________________________  RADIOLOGY  X-ray left knee is negative  ____________________________________________   PROCEDURES  Procedure(s) performed: Percocet 1 p.o.  Procedures    ____________________________________________   INITIAL IMPRESSION / ASSESSMENT AND PLAN / ED COURSE  Pertinent labs & imaging results that were available during my care of the patient were reviewed by me and considered in my medical decision making (see chart for details).   Patient is a 47 year old male presents emergency department complaining of his left leg locking up on him.  He states it makes it difficult to get to the  bathroom on time.  He has not followed up with orthopedics as he has been instructed by [place].  He has been seen here several times for several musculoskeletal complaints.  He denies chest pain, shortness of breath, headache or any other weakness.  On physical exam there is muscle wasting noted of the left leg.  The left knee is tender at the joint line only.  He is neurovascularly intact.  Remainder the exam is unremarkable  Stray of the left knee is negative for any acute bony abnormality.  Explained the findings to the patient.  He is to follow-up with orthopedics.  Has strong discussion with him of why he should follow-up with specialist instead of continuing to come to the emergency department for  his chronic problems.  He was given 1 Percocet p.o.  I told him he was on a pain contract obviously with his primary care doctor per the narcotic prescribing website.  Patient just grounded and shrugged his shoulders.  He was discharged in stable condition.     As part of my medical decision making, I reviewed the following data within the electronic MEDICAL RECORD NUMBER Nursing notes reviewed and incorporated, Old chart reviewed, Radiograph reviewed x-ray left knee is negative, Notes from prior ED visits and [place] Controlled Substance Database  ____________________________________________   FINAL CLINICAL IMPRESSION(S) / ED DIAGNOSES  Final diagnoses:  Left leg pain  Weakness of left lower extremity      NEW MEDICATIONS STARTED DURING THIS VISIT:  New Prescriptions   No medications on file     Note:  This document was prepared using Dragon voice recognition software and may include unintentional dictation errors.    Faythe GheeFisher, Susan W, PA-C 08/16/18 1704    Willy Eddyobinson, Patrick, MD 08/16/18 1944

## 2018-08-29 NOTE — Pre-Procedure Instructions (Signed)
Mark Wells  08/29/2018      [id number] [name], [place] - [address] AT [facility] [name] [place] [address] Phone: [phone] Fax: [phone]  [id number] - [place], [place] - [address] [place] [address] Phone: [phone] Fax: [phone]    Your procedure is scheduled on September 03, 2018.  Report to [facility] Admitting at 845 AM.  Call this number if you have problems the morning of surgery:  [phone]   Remember:  Do not eat or drink after midnight.    Take these medicines the morning of surgery with A SIP OF WATER  Gabapentin (neurontin) Methadone (dolophine) Oxycodone-acetaminophen (percocet) OR tramadol (ultram)-if needed for pain Prednisone (deltasone) lyrica (pregabalin) Sertraline (zoloft)  Follow your surgeon's instructions on when to hold/resume PLAVIX.  If no instructions were given call the office to determine how they would like to you take PLAVIX  7 days prior to surgery STOP taking any Aspirin (unless otherwise instructed by your surgeon), Aleve, Naproxen, Ibuprofen, Motrin, Advil, Goody's, BC's, all herbal medications, fish oil, and all vitamins    Do not wear jewelry, make-up or nail polish.  Do not wear lotions, powders, or perfumes, or deodorant.  Men may shave face and neck.  Do not bring valuables to the hospital.  [facility] is not responsible for any belongings or valuables.  Contacts, dentures or bridgework may not be worn into surgery.  Leave your suitcase in the car.  After surgery it may be brought to your room.  For patients admitted to the hospital, discharge time will be determined by your treatment team.  Patients discharged the day of surgery will not be allowed to drive home.    [facility]- Preparing For Surgery  Before surgery, you can play an important role. Because skin is not sterile, your skin needs  to be as free of germs as possible. You can reduce the number of germs on your skin by washing with CHG (chlorahexidine gluconate) Soap before surgery.  CHG is an antiseptic cleaner which kills germs and bonds with the skin to continue killing germs even after washing.    Oral Hygiene is also important to reduce your risk of infection.  Remember - BRUSH YOUR TEETH THE MORNING OF SURGERY WITH YOUR REGULAR TOOTHPASTE  Please do not use if you have an allergy to CHG or antibacterial soaps. If your skin becomes reddened/irritated stop using the CHG.  Do not shave (including legs and underarms) for at least 48 hours prior to first CHG shower. It is OK to shave your face.  Please follow these instructions carefully.   1. Shower the NIGHT BEFORE SURGERY and the MORNING OF SURGERY with CHG.   2. If you chose to wash your hair, wash your hair first as usual with your normal shampoo.  3. After you shampoo, rinse your hair and body thoroughly to remove the shampoo.  4. Use CHG as you would any other liquid soap. You can apply CHG directly to the skin and wash gently with a scrungie or a clean washcloth.   5. Apply the CHG Soap to your body ONLY FROM THE NECK DOWN.  Do not use on open wounds or open sores. Avoid contact with your eyes, ears, mouth and genitals (private parts). Wash Face and genitals (private parts)  with your normal soap.  6. Wash thoroughly, paying  special attention to the area where your surgery will be performed.  7. Thoroughly rinse your body with warm water from the neck down.  8. DO NOT shower/wash with your normal soap after using and rinsing off the CHG Soap.  9. Pat yourself dry with a CLEAN TOWEL.  10. Wear CLEAN PAJAMAS to bed the night before surgery, wear comfortable clothes the morning of surgery  11. Place CLEAN SHEETS on your bed the night of your first shower and DO NOT SLEEP WITH PETS.  Day of Surgery:  Do not apply any deodorants/lotions.  Please wear clean  clothes to the hospital/surgery center.   Remember to brush your teeth WITH YOUR REGULAR TOOTHPASTE.  Please read over the following fact sheets that you were given. Pain Booklet, Coughing and Deep Breathing, MRSA Information and Surgical Site Infection Prevention

## 2018-08-30 ENCOUNTER — Other Ambulatory Visit: Payer: Self-pay

## 2018-08-30 ENCOUNTER — Encounter (HOSPITAL_COMMUNITY)
Admission: RE | Admit: 2018-08-30 | Discharge: 2018-08-30 | Disposition: A | Discharge status: Home / Self Care | Payer: Medicare HMO | Source: Ambulatory Visit | Attending: Neurosurgery | Admitting: Neurosurgery

## 2018-08-30 ENCOUNTER — Encounter (HOSPITAL_COMMUNITY): Payer: Self-pay

## 2018-08-30 DIAGNOSIS — Z01818 Encounter for other preprocedural examination: Secondary | ICD-10-CM | POA: Insufficient documentation

## 2018-08-30 DIAGNOSIS — I10 Essential (primary) hypertension: Secondary | ICD-10-CM | POA: Insufficient documentation

## 2018-08-30 HISTORY — DX: Sleep apnea, unspecified: G47.30

## 2018-08-30 LAB — CBC WITH DIFFERENTIAL/PLATELET
Abs Immature Granulocytes: 0 10*3/uL (ref 0.0–0.1)
Basophils Absolute: 0.1 10*3/uL (ref 0.0–0.1)
Basophils Relative: 1 %
Eosinophils Absolute: 0.1 10*3/uL (ref 0.0–0.7)
Eosinophils Relative: 2 %
HEMATOCRIT: 48.8 % (ref 39.0–52.0)
HEMOGLOBIN: 16.3 g/dL (ref 13.0–17.0)
Immature Granulocytes: 0 %
LYMPHS ABS: 3.1 10*3/uL (ref 0.7–4.0)
LYMPHS PCT: 49 %
MCH: 30.2 pg (ref 26.0–34.0)
MCHC: 33.4 g/dL (ref 30.0–36.0)
MCV: 90.5 fL (ref 78.0–100.0)
MONO ABS: 0.5 10*3/uL (ref 0.1–1.0)
MONOS PCT: 7 %
Neutro Abs: 2.5 10*3/uL (ref 1.7–7.7)
Neutrophils Relative %: 41 %
Platelets: 287 10*3/uL (ref 150–400)
RBC: 5.39 MIL/uL (ref 4.22–5.81)
RDW: 12.6 % (ref 11.5–15.5)
WBC: 6.2 10*3/uL (ref 4.0–10.5)

## 2018-08-30 LAB — SURGICAL PCR SCREEN
MRSA, PCR: NEGATIVE
STAPHYLOCOCCUS AUREUS: NEGATIVE

## 2018-08-30 LAB — BASIC METABOLIC PANEL
Anion gap: 14 (ref 5–15)
BUN: 11 mg/dL (ref 6–20)
CALCIUM: 9.8 mg/dL (ref 8.9–10.3)
CO2: 25 mmol/L (ref 22–32)
Chloride: 100 mmol/L (ref 98–111)
Creatinine, Ser: 0.82 mg/dL (ref 0.61–1.24)
GFR calc Af Amer: >60 mL/min (ref >60)
GFR calc non Af Amer: >60 mL/min (ref >60)
GLUCOSE: 86 mg/dL (ref 70–99)
Potassium: 3.3 mmol/L — ABNORMAL LOW (ref 3.5–5.1)
Sodium: 139 mmol/L (ref 135–145)

## 2018-08-30 LAB — TYPE AND SCREEN
ABO/RH(D): B POS
ANTIBODY SCREEN: NEGATIVE

## 2018-08-30 LAB — ABO/RH: ABO/RH(D): B POS

## 2018-08-30 NOTE — Pre-Procedure Instructions (Signed)
Mark Wells  08/30/2018      [facility] #[id number] [name], [place] - [address] AT [facility] OF [name] [place] [address] Phone: [phone] Fax: [phone]  [id number] - [place], [place] - [address] [place] [address] Phone: [phone] Fax: [phone]    Your procedure is scheduled on September 03, 2018.  Report to [facility] Admitting at 845 AM.  Call this number if you have problems the morning of surgery:  [phone]   Remember:  Do not eat or drink after midnight.    Take these medicines the morning of surgery with A SIP OF WATER  AMLODIPINE (NORVASC) Oxycodone-acetaminophen (percocet)  -if needed for pain lyrica (pregabalin) BACLOFEN  STOP PLAVIX NOW Follow your surgeon's instructions on when to hold/resume PLAVIX.  If no instructions were given call the office to determine how they would like to you take PLAVIX  7 days prior to surgery STOP taking any Aspirin (unless otherwise instructed by your surgeon), Aleve, Naproxen, Ibuprofen, Motrin, Advil, Goody's, BC's, all herbal medications, fish oil, and all vitamins    Do not wear jewelry, make-up or nail polish.  Do not wear lotions, powders, or perfumes, or deodorant.  Men may shave face and neck.  Do not bring valuables to the hospital.  [facility] is not responsible for any belongings or valuables.  Contacts, dentures or bridgework may not be worn into surgery.  Leave your suitcase in the car.  After surgery it may be brought to your room.  For patients admitted to the hospital, discharge time will be determined by your treatment team.  Patients discharged the day of surgery will not be allowed to drive home.    [place]- Preparing For Surgery  Before surgery, you can play an important role. Because skin is not sterile, your skin needs to be as free of germs as possible. You can reduce the number  of germs on your skin by washing with CHG (chlorahexidine gluconate) Soap before surgery.  CHG is an antiseptic cleaner which kills germs and bonds with the skin to continue killing germs even after washing.    Oral Hygiene is also important to reduce your risk of infection.  Remember - BRUSH YOUR TEETH THE MORNING OF SURGERY WITH YOUR REGULAR TOOTHPASTE  Please do not use if you have an allergy to CHG or antibacterial soaps. If your skin becomes reddened/irritated stop using the CHG.  Do not shave (including legs and underarms) for at least 48 hours prior to first CHG shower. It is OK to shave your face.  Please follow these instructions carefully.   1. Shower the NIGHT BEFORE SURGERY and the MORNING OF SURGERY with CHG.   2. If you chose to wash your hair, wash your hair first as usual with your normal shampoo.  3. After you shampoo, rinse your hair and body thoroughly to remove the shampoo.  4. Use CHG as you would any other liquid soap. You can apply CHG directly to the skin and wash gently with a scrungie or a clean washcloth.   5. Apply the CHG Soap to your body ONLY FROM THE NECK DOWN.  Do not use on open wounds or open sores. Avoid contact with your eyes, ears, mouth and genitals (private parts). Wash Face and genitals (private parts)  with your normal soap.  6. Wash thoroughly, paying special attention to  the area where your surgery will be performed.  7. Thoroughly rinse your body with warm water from the neck down.  8. DO NOT shower/wash with your normal soap after using and rinsing off the CHG Soap.  9. Pat yourself dry with a CLEAN TOWEL.  10. Wear CLEAN PAJAMAS to bed the night before surgery, wear comfortable clothes the morning of surgery  11. Place CLEAN SHEETS on your bed the night of your first shower and DO NOT SLEEP WITH PETS.  Day of Surgery:  Do not apply any deodorants/lotions.  Please wear clean clothes to the hospital/surgery center.   Remember to brush  your teeth WITH YOUR REGULAR TOOTHPASTE.  Please read over the following fact sheets that you were given. Pain Booklet, Coughing and Deep Breathing, MRSA Information and Surgical Site Infection Prevention

## 2018-09-03 ENCOUNTER — Inpatient Hospital Stay (HOSPITAL_COMMUNITY)
Admission: RE | Disposition: A | Discharge status: Home / Self Care | Payer: Self-pay | Source: Ambulatory Visit | Attending: Neurosurgery

## 2018-09-03 ENCOUNTER — Inpatient Hospital Stay (HOSPITAL_COMMUNITY): Payer: Medicare HMO | Admitting: Physician Assistant

## 2018-09-03 ENCOUNTER — Inpatient Hospital Stay (HOSPITAL_COMMUNITY): Payer: Medicare HMO

## 2018-09-03 ENCOUNTER — Other Ambulatory Visit: Payer: Self-pay

## 2018-09-03 ENCOUNTER — Inpatient Hospital Stay (HOSPITAL_COMMUNITY): Payer: Medicare HMO | Admitting: Anesthesiology

## 2018-09-03 ENCOUNTER — Encounter (HOSPITAL_COMMUNITY): Payer: Self-pay

## 2018-09-03 ENCOUNTER — Inpatient Hospital Stay (HOSPITAL_COMMUNITY)
Admission: RE | Admit: 2018-09-03 | Discharge: 2018-09-04 | DRG: 516 | Disposition: A | Discharge status: Home / Self Care | Payer: Medicare HMO | Source: Ambulatory Visit | Attending: Neurosurgery | Admitting: Neurosurgery

## 2018-09-03 DIAGNOSIS — Z7902 Long term (current) use of antithrombotics/antiplatelets: Secondary | ICD-10-CM | POA: Diagnosis not present

## 2018-09-03 DIAGNOSIS — Z79899 Other long term (current) drug therapy: Secondary | ICD-10-CM | POA: Diagnosis not present

## 2018-09-03 DIAGNOSIS — Z886 Allergy status to analgesic agent status: Secondary | ICD-10-CM

## 2018-09-03 DIAGNOSIS — G2581 Restless legs syndrome: Secondary | ICD-10-CM | POA: Diagnosis present

## 2018-09-03 DIAGNOSIS — T8484XA Pain due to internal orthopedic prosthetic devices, implants and grafts, initial encounter: Principal | ICD-10-CM | POA: Diagnosis present

## 2018-09-03 DIAGNOSIS — Y831 Surgical operation with implant of artificial internal device as the cause of abnormal reaction of the patient, or of later complication, without mention of misadventure at the time of the procedure: Secondary | ICD-10-CM | POA: Diagnosis present

## 2018-09-03 DIAGNOSIS — Z419 Encounter for procedure for purposes other than remedying health state, unspecified: Secondary | ICD-10-CM

## 2018-09-03 DIAGNOSIS — I69354 Hemiplegia and hemiparesis following cerebral infarction affecting left non-dominant side: Secondary | ICD-10-CM

## 2018-09-03 DIAGNOSIS — G4730 Sleep apnea, unspecified: Secondary | ICD-10-CM | POA: Diagnosis present

## 2018-09-03 DIAGNOSIS — E785 Hyperlipidemia, unspecified: Secondary | ICD-10-CM | POA: Diagnosis present

## 2018-09-03 DIAGNOSIS — F17210 Nicotine dependence, cigarettes, uncomplicated: Secondary | ICD-10-CM | POA: Diagnosis present

## 2018-09-03 DIAGNOSIS — I10 Essential (primary) hypertension: Secondary | ICD-10-CM | POA: Diagnosis present

## 2018-09-03 DIAGNOSIS — Z888 Allergy status to other drugs, medicaments and biological substances status: Secondary | ICD-10-CM | POA: Diagnosis not present

## 2018-09-03 SURGERY — POSTERIOR LUMBAR FUSION 1 WITH HARDWARE REMOVAL
Anesthesia: General | Site: Back

## 2018-09-03 MED ORDER — PROPOFOL 10 MG/ML IV BOLUS
INTRAVENOUS | Status: DC | PRN
  Administered 2018-09-03: 200 mg via INTRAVENOUS

## 2018-09-03 MED ORDER — CEFAZOLIN SODIUM-DEXTROSE 2-4 GM/100ML-% IV SOLN: 2.0000 g | INTRAVENOUS | Status: DC

## 2018-09-03 MED ORDER — MIDAZOLAM HCL 5 MG/5ML IJ SOLN
INTRAMUSCULAR | Status: DC | PRN
  Administered 2018-09-03: 1 mg via INTRAVENOUS

## 2018-09-03 MED ORDER — VANCOMYCIN HCL 1 G IV SOLR: INTRAVENOUS | Status: DC | PRN

## 2018-09-03 MED ORDER — NICOTINE 21 MG/24HR TD PT24
21.0000 mg | MEDICATED_PATCH | Freq: Every day | TRANSDERMAL | Status: DC
  Administered 2018-09-03 – 2018-09-04 (×2): 21 mg via TRANSDERMAL
  Filled 2018-09-03 (×2): qty 1

## 2018-09-03 MED ORDER — SODIUM CHLORIDE 0.9 % IV SOLN
250.0000 mL | INTRAVENOUS | Status: DC
  Administered 2018-09-03: 250 mL via INTRAVENOUS

## 2018-09-03 MED ORDER — PHENYLEPHRINE 40 MCG/ML (10ML) SYRINGE FOR IV PUSH (FOR BLOOD PRESSURE SUPPORT)
PREFILLED_SYRINGE | INTRAVENOUS | Status: DC | PRN
  Administered 2018-09-03 (×2): 80 ug via INTRAVENOUS
  Administered 2018-09-03: 120 ug via INTRAVENOUS
  Administered 2018-09-03: 80 ug via INTRAVENOUS
  Administered 2018-09-03: 40 ug via INTRAVENOUS

## 2018-09-03 MED ORDER — 0.9 % SODIUM CHLORIDE (POUR BTL) OPTIME
TOPICAL | Status: DC | PRN
  Administered 2018-09-03: 1000 mL

## 2018-09-03 MED ORDER — CYCLOBENZAPRINE HCL 10 MG PO TABS
10.0000 mg | ORAL_TABLET | Freq: Three times a day (TID) | ORAL | Status: DC | PRN
  Administered 2018-09-03: 10 mg via ORAL
  Filled 2018-09-03: qty 1

## 2018-09-03 MED ORDER — ACETAMINOPHEN 325 MG PO TABS: 650.0000 mg | ORAL_TABLET | ORAL | Status: DC | PRN

## 2018-09-03 MED ORDER — HYDROMORPHONE HCL 1 MG/ML IJ SOLN
1.0000 mg | INTRAMUSCULAR | Status: DC | PRN
  Administered 2018-09-03 – 2018-09-04 (×4): 1 mg via INTRAVENOUS
  Filled 2018-09-03 (×3): qty 1

## 2018-09-03 MED ORDER — SODIUM CHLORIDE 0.9% FLUSH: 3.0000 mL | INTRAVENOUS | Status: DC | PRN

## 2018-09-03 MED ORDER — LOSARTAN POTASSIUM 50 MG PO TABS
100.0000 mg | ORAL_TABLET | Freq: Every day | ORAL | Status: DC
  Administered 2018-09-03 – 2018-09-04 (×2): 100 mg via ORAL
  Filled 2018-09-03 (×2): qty 2

## 2018-09-03 MED ORDER — BACLOFEN 20 MG PO TABS
20.0000 mg | ORAL_TABLET | Freq: Four times a day (QID) | ORAL | Status: DC
  Administered 2018-09-03 – 2018-09-04 (×3): 20 mg via ORAL
  Filled 2018-09-03 (×4): qty 1

## 2018-09-03 MED ORDER — HYDROCODONE-ACETAMINOPHEN 10-325 MG PO TABS
2.0000 | ORAL_TABLET | ORAL | Status: DC | PRN
  Administered 2018-09-03 – 2018-09-04 (×4): 2 via ORAL
  Filled 2018-09-03 (×3): qty 2

## 2018-09-03 MED ORDER — CLOPIDOGREL BISULFATE 75 MG PO TABS
75.0000 mg | ORAL_TABLET | Freq: Every day | ORAL | Status: DC
  Administered 2018-09-03 – 2018-09-04 (×2): 75 mg via ORAL
  Filled 2018-09-03 (×2): qty 1

## 2018-09-03 MED ORDER — ACETAMINOPHEN 10 MG/ML IV SOLN
INTRAVENOUS | Status: DC | PRN
  Administered 2018-09-03: 1000 mg via INTRAVENOUS

## 2018-09-03 MED ORDER — MENTHOL 3 MG MT LOZG: 1.0000 | LOZENGE | OROMUCOSAL | Status: DC | PRN

## 2018-09-03 MED ORDER — CEFAZOLIN SODIUM-DEXTROSE 1-4 GM/50ML-% IV SOLN
1.0000 g | Freq: Three times a day (TID) | INTRAVENOUS | Status: AC
  Administered 2018-09-03 – 2018-09-04 (×2): 1 g via INTRAVENOUS
  Filled 2018-09-03 (×2): qty 50

## 2018-09-03 MED ORDER — OXYCODONE-ACETAMINOPHEN 7.5-325 MG PO TABS: 1.0000 | ORAL_TABLET | Freq: Four times a day (QID) | ORAL | Status: DC | PRN

## 2018-09-03 MED ORDER — PHENOL 1.4 % MT LIQD: 1.0000 | OROMUCOSAL | Status: DC | PRN

## 2018-09-03 MED ORDER — PHENYLEPHRINE 40 MCG/ML (10ML) SYRINGE FOR IV PUSH (FOR BLOOD PRESSURE SUPPORT)
PREFILLED_SYRINGE | INTRAVENOUS | Status: AC
  Filled 2018-09-03: qty 10

## 2018-09-03 MED ORDER — ONDANSETRON HCL 4 MG/2ML IJ SOLN: 4.0000 mg | Freq: Once | INTRAMUSCULAR | Status: DC | PRN

## 2018-09-03 MED ORDER — PREGABALIN 75 MG PO CAPS
300.0000 mg | ORAL_CAPSULE | Freq: Two times a day (BID) | ORAL | Status: DC
  Administered 2018-09-03 – 2018-09-04 (×2): 300 mg via ORAL
  Filled 2018-09-03 (×2): qty 4

## 2018-09-03 MED ORDER — FENTANYL CITRATE (PF) 250 MCG/5ML IJ SOLN
INTRAMUSCULAR | Status: DC | PRN
  Administered 2018-09-03: 100 ug via INTRAVENOUS
  Administered 2018-09-03: 150 ug via INTRAVENOUS

## 2018-09-03 MED ORDER — CHLORHEXIDINE GLUCONATE CLOTH 2 % EX PADS: 6.0000 | MEDICATED_PAD | Freq: Once | CUTANEOUS | Status: DC

## 2018-09-03 MED ORDER — CYCLOBENZAPRINE HCL 10 MG PO TABS
ORAL_TABLET | ORAL | Status: AC
  Filled 2018-09-03: qty 1

## 2018-09-03 MED ORDER — SODIUM CHLORIDE 0.9 % IV SOLN
INTRAVENOUS | Status: DC | PRN
  Administered 2018-09-03: 500 mL

## 2018-09-03 MED ORDER — ACETAMINOPHEN 650 MG RE SUPP: 650.0000 mg | RECTAL | Status: DC | PRN

## 2018-09-03 MED ORDER — FENTANYL CITRATE (PF) 100 MCG/2ML IJ SOLN
INTRAMUSCULAR | Status: AC
  Filled 2018-09-03: qty 2

## 2018-09-03 MED ORDER — HYDROMORPHONE HCL 1 MG/ML IJ SOLN
INTRAMUSCULAR | Status: AC
  Filled 2018-09-03: qty 1

## 2018-09-03 MED ORDER — ONDANSETRON HCL 4 MG/2ML IJ SOLN
INTRAMUSCULAR | Status: DC | PRN
  Administered 2018-09-03: 4 mg via INTRAVENOUS

## 2018-09-03 MED ORDER — ATORVASTATIN CALCIUM 20 MG PO TABS
40.0000 mg | ORAL_TABLET | Freq: Every evening | ORAL | Status: DC
  Administered 2018-09-03: 40 mg via ORAL
  Filled 2018-09-03 (×2): qty 2

## 2018-09-03 MED ORDER — KETAMINE HCL 10 MG/ML IJ SOLN
INTRAMUSCULAR | Status: DC | PRN
  Administered 2018-09-03: 20 mg via INTRAVENOUS
  Administered 2018-09-03: 30 mg via INTRAVENOUS

## 2018-09-03 MED ORDER — OXYCODONE HCL 5 MG/5ML PO SOLN: 5.0000 mg | Freq: Once | ORAL | Status: DC | PRN

## 2018-09-03 MED ORDER — FENTANYL CITRATE (PF) 250 MCG/5ML IJ SOLN
INTRAMUSCULAR | Status: AC
  Filled 2018-09-03: qty 5

## 2018-09-03 MED ORDER — SODIUM CHLORIDE 0.9% FLUSH
3.0000 mL | Freq: Two times a day (BID) | INTRAVENOUS | Status: DC
  Administered 2018-09-03: 3 mL via INTRAVENOUS

## 2018-09-03 MED ORDER — PROPOFOL 10 MG/ML IV BOLUS
INTRAVENOUS | Status: AC
  Filled 2018-09-03: qty 20

## 2018-09-03 MED ORDER — ALUM & MAG HYDROXIDE-SIMETH 200-200-20 MG/5ML PO SUSP
30.0000 mL | Freq: Four times a day (QID) | ORAL | Status: DC | PRN
  Administered 2018-09-03 – 2018-09-04 (×2): 30 mL via ORAL
  Filled 2018-09-03 (×2): qty 30

## 2018-09-03 MED ORDER — HYDROCODONE-ACETAMINOPHEN 5-325 MG PO TABS: 1.0000 | ORAL_TABLET | ORAL | Status: DC | PRN

## 2018-09-03 MED ORDER — FENTANYL CITRATE (PF) 100 MCG/2ML IJ SOLN
25.0000 ug | INTRAMUSCULAR | Status: DC | PRN
  Administered 2018-09-03 (×3): 50 ug via INTRAVENOUS

## 2018-09-03 MED ORDER — VANCOMYCIN HCL 1000 MG IV SOLR
INTRAVENOUS | Status: AC
  Filled 2018-09-03: qty 1000

## 2018-09-03 MED ORDER — ACETAMINOPHEN 10 MG/ML IV SOLN
INTRAVENOUS | Status: AC
  Filled 2018-09-03: qty 100

## 2018-09-03 MED ORDER — ONDANSETRON HCL 4 MG PO TABS: 4.0000 mg | ORAL_TABLET | Freq: Four times a day (QID) | ORAL | Status: DC | PRN

## 2018-09-03 MED ORDER — HYDROCODONE-ACETAMINOPHEN 10-325 MG PO TABS
ORAL_TABLET | ORAL | Status: AC
  Filled 2018-09-03: qty 2

## 2018-09-03 MED ORDER — LACTATED RINGERS IV SOLN
INTRAVENOUS | Status: DC | PRN
  Administered 2018-09-03 (×2): via INTRAVENOUS

## 2018-09-03 MED ORDER — CEFAZOLIN SODIUM-DEXTROSE 2-3 GM-%(50ML) IV SOLR
INTRAVENOUS | Status: DC | PRN
  Administered 2018-09-03: 2 g via INTRAVENOUS

## 2018-09-03 MED ORDER — LIDOCAINE 2% (20 MG/ML) 5 ML SYRINGE
INTRAMUSCULAR | Status: DC | PRN
  Administered 2018-09-03: 50 mg via INTRAVENOUS

## 2018-09-03 MED ORDER — ONDANSETRON HCL 4 MG/2ML IJ SOLN: 4.0000 mg | Freq: Four times a day (QID) | INTRAMUSCULAR | Status: DC | PRN

## 2018-09-03 MED ORDER — OXYCODONE HCL 5 MG PO TABS: 5.0000 mg | ORAL_TABLET | Freq: Once | ORAL | Status: DC | PRN

## 2018-09-03 MED ORDER — SUGAMMADEX SODIUM 200 MG/2ML IV SOLN
INTRAVENOUS | Status: DC | PRN
  Administered 2018-09-03: 200 mg via INTRAVENOUS

## 2018-09-03 MED ORDER — BUPIVACAINE HCL (PF) 0.25 % IJ SOLN
INTRAMUSCULAR | Status: DC | PRN
  Administered 2018-09-03: 20 mL

## 2018-09-03 MED ORDER — CEFAZOLIN SODIUM-DEXTROSE 2-4 GM/100ML-% IV SOLN
INTRAVENOUS | Status: AC
  Filled 2018-09-03: qty 100

## 2018-09-03 MED ORDER — MIDAZOLAM HCL 2 MG/2ML IJ SOLN
INTRAMUSCULAR | Status: AC
  Filled 2018-09-03: qty 2

## 2018-09-03 MED ORDER — CHLORTHALIDONE 25 MG PO TABS
25.0000 mg | ORAL_TABLET | Freq: Every day | ORAL | Status: DC
  Administered 2018-09-03 – 2018-09-04 (×2): 25 mg via ORAL
  Filled 2018-09-03 (×2): qty 1

## 2018-09-03 MED ORDER — THROMBIN 20000 UNITS EX KIT
PACK | CUTANEOUS | Status: AC
  Filled 2018-09-03: qty 1

## 2018-09-03 MED ORDER — THROMBIN 20000 UNITS EX SOLR
CUTANEOUS | Status: DC | PRN
  Administered 2018-09-03: 11:00:00

## 2018-09-03 MED ORDER — KETAMINE HCL 50 MG/5ML IJ SOSY
PREFILLED_SYRINGE | INTRAMUSCULAR | Status: AC
  Filled 2018-09-03: qty 5

## 2018-09-03 MED ORDER — BUPIVACAINE HCL (PF) 0.25 % IJ SOLN
INTRAMUSCULAR | Status: AC
  Filled 2018-09-03: qty 30

## 2018-09-03 MED ORDER — DEXAMETHASONE SODIUM PHOSPHATE 10 MG/ML IJ SOLN
10.0000 mg | INTRAMUSCULAR | Status: DC
  Filled 2018-09-03: qty 1

## 2018-09-03 MED ORDER — AMLODIPINE BESYLATE 5 MG PO TABS
5.0000 mg | ORAL_TABLET | Freq: Every day | ORAL | Status: DC
  Administered 2018-09-03 – 2018-09-04 (×2): 5 mg via ORAL
  Filled 2018-09-03 (×2): qty 1

## 2018-09-03 MED ORDER — ROCURONIUM BROMIDE 10 MG/ML (PF) SYRINGE
PREFILLED_SYRINGE | INTRAVENOUS | Status: DC | PRN
  Administered 2018-09-03: 50 mg via INTRAVENOUS

## 2018-09-03 SURGICAL SUPPLY — 53 items
BAG DECANTER FOR FLEXI CONT (MISCELLANEOUS) ×2 IMPLANT
BENZOIN TINCTURE PRP APPL 2/3 (GAUZE/BANDAGES/DRESSINGS) ×2 IMPLANT
BLADE CLIPPER SURG (BLADE) IMPLANT
BUR CUTTER 7.0 ROUND (BURR) ×2 IMPLANT
BUR MATCHSTICK NEURO 3.0 LAGG (BURR) ×2 IMPLANT
CANISTER SUCT 3000ML PPV (MISCELLANEOUS) ×2 IMPLANT
CARTRIDGE OIL MAESTRO DRILL (MISCELLANEOUS) ×1 IMPLANT
CONT SPEC 4OZ CLIKSEAL STRL BL (MISCELLANEOUS) ×2 IMPLANT
COVER BACK TABLE 60X90IN (DRAPES) ×2 IMPLANT
DECANTER SPIKE VIAL GLASS SM (MISCELLANEOUS) ×2 IMPLANT
DERMABOND ADVANCED (GAUZE/BANDAGES/DRESSINGS) ×1
DERMABOND ADVANCED .7 DNX12 (GAUZE/BANDAGES/DRESSINGS) ×1 IMPLANT
DIFFUSER DRILL AIR PNEUMATIC (MISCELLANEOUS) ×2 IMPLANT
DRAPE C-ARM 42X72 X-RAY (DRAPES) ×4 IMPLANT
DRAPE HALF SHEET 40X57 (DRAPES) IMPLANT
DRAPE LAPAROTOMY 100X72X124 (DRAPES) ×2 IMPLANT
DRAPE SURG 17X23 STRL (DRAPES) ×8 IMPLANT
DRSG OPSITE POSTOP 4X8 (GAUZE/BANDAGES/DRESSINGS) ×2 IMPLANT
DURAPREP 26ML APPLICATOR (WOUND CARE) ×2 IMPLANT
ELECT BLADE 4.0 EZ CLEAN MEGAD (MISCELLANEOUS) ×2
ELECT REM PT RETURN 9FT ADLT (ELECTROSURGICAL) ×2
ELECTRODE BLDE 4.0 EZ CLN MEGD (MISCELLANEOUS) ×1 IMPLANT
ELECTRODE REM PT RTRN 9FT ADLT (ELECTROSURGICAL) ×1 IMPLANT
EVACUATOR 1/8 PVC DRAIN (DRAIN) IMPLANT
GAUZE 4X4 16PLY RFD (DISPOSABLE) IMPLANT
GAUZE SPONGE 4X4 12PLY STRL (GAUZE/BANDAGES/DRESSINGS) ×2 IMPLANT
GLOVE ECLIPSE 9.0 STRL (GLOVE) ×4 IMPLANT
GLOVE EXAM NITRILE LRG STRL (GLOVE) IMPLANT
GLOVE EXAM NITRILE XL STR (GLOVE) IMPLANT
GLOVE EXAM NITRILE XS STR PU (GLOVE) IMPLANT
GOWN STRL REUS W/ TWL LRG LVL3 (GOWN DISPOSABLE) IMPLANT
GOWN STRL REUS W/ TWL XL LVL3 (GOWN DISPOSABLE) ×2 IMPLANT
GOWN STRL REUS W/TWL 2XL LVL3 (GOWN DISPOSABLE) IMPLANT
GOWN STRL REUS W/TWL LRG LVL3 (GOWN DISPOSABLE)
GOWN STRL REUS W/TWL XL LVL3 (GOWN DISPOSABLE) ×2
KIT BASIN OR (CUSTOM PROCEDURE TRAY) ×2 IMPLANT
KIT TURNOVER KIT B (KITS) ×2 IMPLANT
MILL MEDIUM DISP (BLADE) ×2 IMPLANT
NEEDLE HYPO 22GX1.5 SAFETY (NEEDLE) ×2 IMPLANT
NS IRRIG 1000ML POUR BTL (IV SOLUTION) ×2 IMPLANT
OIL CARTRIDGE MAESTRO DRILL (MISCELLANEOUS) ×2
PACK LAMINECTOMY NEURO (CUSTOM PROCEDURE TRAY) ×2 IMPLANT
RASP 3.0MM (RASP) ×2 IMPLANT
SPONGE SURGIFOAM ABS GEL 100 (HEMOSTASIS) ×2 IMPLANT
STRIP CLOSURE SKIN 1/2X4 (GAUZE/BANDAGES/DRESSINGS) ×2 IMPLANT
SUT VIC AB 0 CT1 18XCR BRD8 (SUTURE) ×1 IMPLANT
SUT VIC AB 0 CT1 8-18 (SUTURE) ×1
SUT VIC AB 2-0 CT1 18 (SUTURE) ×2 IMPLANT
SUT VIC AB 3-0 SH 8-18 (SUTURE) ×4 IMPLANT
TOWEL GREEN STERILE (TOWEL DISPOSABLE) ×2 IMPLANT
TOWEL GREEN STERILE FF (TOWEL DISPOSABLE) ×2 IMPLANT
TRAY FOLEY MTR SLVR 16FR STAT (SET/KITS/TRAYS/PACK) ×2 IMPLANT
WATER STERILE IRR 1000ML POUR (IV SOLUTION) ×2 IMPLANT

## 2018-09-03 NOTE — Anesthesia Postprocedure Evaluation (Signed)
Anesthesia Post Note  Patient: Mark Wells  Procedure(s) Performed: Re-exploration of Lumbar Five Sacral One posterior fusion with hardware removal  (N/A Back)     Patient location during evaluation: PACU Anesthesia Type: General Level of consciousness: awake and alert Pain management: pain level controlled Vital Signs Assessment: post-procedure vital signs reviewed and stable Respiratory status: spontaneous breathing, nonlabored ventilation, respiratory function stable and patient connected to nasal cannula oxygen Cardiovascular status: blood pressure returned to baseline and stable Postop Assessment: no apparent nausea or vomiting Anesthetic complications: no    Last Vitals:  Vitals:   09/03/18 1445 09/03/18 1509  BP:  (!) 105/91  Pulse: 64 75  Resp: 15 (!) 21  Temp:  36.8 C  SpO2: 98% 97%    Last Pain:  Vitals:   09/03/18 1618  TempSrc:   PainSc: 8                  Lucretia Kern

## 2018-09-03 NOTE — Transfer of Care (Signed)
Immediate Anesthesia Transfer of Care Note  Patient: Mark Wells  Procedure(s) Performed: Re-exploration of Lumbar Five Sacral One posterior fusion with hardware removal  (N/A Back)  Patient Location: PACU  Anesthesia Type:General  Level of Consciousness: awake, alert  and oriented  Airway & Oxygen Therapy: Patient Spontanous Breathing and Patient connected to nasal cannula oxygen  Post-op Assessment: Report given to RN and Post -op Vital signs reviewed and stable  Post vital signs: Reviewed and stable  Last Vitals:  Vitals Value Taken Time  BP 131/97 09/03/2018 12:25 PM  Temp    Pulse 67 09/03/2018 12:25 PM  Resp 22 09/03/2018 12:25 PM  SpO2 100 % 09/03/2018 12:25 PM  Vitals shown include unvalidated device data.  Last Pain:  Vitals:   09/03/18 0840  TempSrc:   PainSc: 7       Patients Stated Pain Goal: 3 (09/03/18 0840)  Complications: No apparent anesthesia complications

## 2018-09-03 NOTE — H&P (Signed)
Mark Wells is an 47 y.o. male.   Chief Complaint: Back pain HPI: 47 year old male status post prior L5-S1 decompression and fusion done in outside hospital many years ago.  Patient with ongoing back pain worsened with standing or weightbearing.  Patient has failed conservative management and medical management of this pain.  The pain itself is predominantly in his left lower lumbar region extending toward his lower sacrum.  It does not radiate into his lower extremities.  Patient situation is complicated by a prior right brain CVA with marked residual left-sided weakness which has left the patient is essentially nonambulatory.  Work-up demonstrates evidence of loosening and fracturing of his L5-S1 instrumentation.  His fusion at L5-S1 appears to have healed recently solidly anteriorly in spite of this.  Past Medical History:  Diagnosis Date  . Anemia   . Anxiety   . Chronic back pain   . Chronic pain   . Depression   . Hemiplegia affecting left nondominant side (HCC)   . Hyperlipidemia   . Hypertension   . Mood disorder (HCC)   . Personality disorder (HCC)   . Restless leg syndrome   . Sleep apnea   . Stroke [name])     Past Surgical History:  Procedure Laterality Date  . BACK SURGERY      History reviewed. No pertinent family history. Social History:  reports that he has been smoking cigarettes. He has been smoking about 0.50 packs per day. He has never used smokeless tobacco. He reports that he does not drink alcohol or use drugs.  Allergies:  Allergies  Allergen Reactions  . Aspirin Other (See Comments)    Has Gastric Ulcer  . Ibuprofen     Ulcers- bleeding  . Iodine Swelling  . Shellfish Allergy Swelling    Medications Prior to Admission  Medication Sig Dispense Refill  . amLODipine (NORVASC) 5 MG tablet Take 5 mg by mouth daily.    [name] atorvastatin (LIPITOR) 40 MG tablet Take 40 mg by mouth every evening.    . baclofen (LIORESAL) 20 MG tablet Take 20 mg by mouth 4  (four) times daily.    . chlorthalidone (HYGROTON) 25 MG tablet Take 25 mg by mouth daily.    . clopidogrel (PLAVIX) 75 MG tablet Take 75 mg by mouth daily.    [name] losartan (COZAAR) 100 MG tablet Take 100 mg by mouth daily.    [name] oxyCODONE-acetaminophen (PERCOCET) 7.5-325 MG tablet Take 1 tablet by mouth every 6 (six) hours as needed for severe pain. 12 tablet 0  . pregabalin (LYRICA) 300 MG capsule Take 300 mg by mouth 2 (two) times daily.     . predniSONE (DELTASONE) 50 MG tablet Take 1 tablet (50 mg total) by mouth daily with breakfast. (Patient not taking: Reported on 08/30/2018) 5 tablet 0  . traMADol (ULTRAM) 50 MG tablet Take 50 mg by mouth every 8 (eight) hours as needed for moderate pain.    [name] tuberculin 5 UNIT/0.1ML injection Inject 0.1 mLs into the skin once.      No results found for this or any previous visit (from the past 48 hour(s)). No results found.  Pertinent items noted in HPI and remainder of comprehensive ROS otherwise negative.  Blood pressure 108/85, pulse 69, temperature 98.8 F (37.1 C), temperature source Oral, resp. rate 18, height 5\' 6"  (1.676 m), weight 85.3 kg, SpO2 100 %.  Patient is awake and alert.  He is oriented and appropriate.  His speech is slow but otherwise fluent.  Judgment insight appear intact.  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  Patient also has a tongue protrudes slightly to the left.  Motor examination reveals 3/5 strength in his left lower extremity and 2/5 spastic weakness in his left upper extremity.  Right-sided strength is normal.  Previous lumbar wound is well-healed.  Examination head ears eyes nose throat is unremarkable.  Chest and abdomen are benign.  Extremities are free from injury deformity. Assessment/Plan Status post L5-S1 posterior lateral fusion with failed hardware and painful lumbar instrumentation.  Plan reexploration of lumbar spine with  removal of hardware.  Risks and benefits of been explained.  Patient wishes to proceed.  Sherilyn Cooter A [name] 09/03/2018, 10:13 AM

## 2018-09-03 NOTE — Anesthesia Preprocedure Evaluation (Signed)
Anesthesia Evaluation  Patient identified by MRN, date of birth, ID band Patient awake    Reviewed: Allergy & Precautions, NPO status , Patient's Chart, lab work & pertinent test results  History of Anesthesia Complications (+) AWARENESS UNDER ANESTHESIA and history of anesthetic complications (pt reports he woke up in the middle of a prolonged spine surgery)  Airway Mallampati: II  TM Distance: >3 FB Neck ROM: Full    Dental  (+) Upper Dentures, Lower Dentures   Pulmonary sleep apnea , Current Smoker,    Pulmonary exam normal        Cardiovascular hypertension, Normal cardiovascular exam     Neuro/Psych Anxiety Depression CVA, Residual Symptoms    GI/Hepatic negative GI ROS, Neg liver ROS,   Endo/Other  negative endocrine ROS  Renal/GU negative Renal ROS     Musculoskeletal negative musculoskeletal ROS (+)   Abdominal   Peds  Hematology negative hematology ROS (+)   Anesthesia Other Findings   Reproductive/Obstetrics                             Anesthesia Physical Anesthesia Plan  ASA: III  Anesthesia Plan: General   Post-op Pain Management:    Induction: Intravenous  PONV Risk Score and Plan: 1 and Ondansetron and Treatment may vary due to age or medical condition  Airway Management Planned: Oral ETT  Additional Equipment:   Intra-op Plan:   Post-operative Plan: Extubation in OR  Informed Consent: I have reviewed the patients History and Physical, chart, labs and discussed the procedure including the risks, benefits and alternatives for the proposed anesthesia with the patient or authorized representative who has indicated his/her understanding and acceptance.     Plan Discussed with:   Anesthesia Plan Comments: (Stopped Plavix >7 days ago)        Anesthesia Quick Evaluation

## 2018-09-03 NOTE — Plan of Care (Signed)
  Problem: Safety: Goal: Ability to remain free from injury will improve Outcome: Progressing   

## 2018-09-03 NOTE — Anesthesia Procedure Notes (Signed)
Procedure Name: Intubation Date/Time: 09/03/2018 10:52 AM Performed by: Mariea Clonts, CRNA Pre-anesthesia Checklist: Patient identified, Emergency Drugs available, Suction available and Patient being monitored Patient Re-evaluated:Patient Re-evaluated prior to induction Oxygen Delivery Method: Circle System Utilized Preoxygenation: Pre-oxygenation with 100% oxygen Induction Type: IV induction Ventilation: Mask ventilation without difficulty Laryngoscope Size: Mac and 4 Grade View: Grade I Tube type: Oral Tube size: 7.5 mm Number of attempts: 1 Airway Equipment and Method: Stylet and Oral airway Placement Confirmation: ETT inserted through vocal cords under direct vision,  positive ETCO2 and breath sounds checked- equal and bilateral Tube secured with: Tape Dental Injury: Teeth and Oropharynx as per pre-operative assessment

## 2018-09-03 NOTE — Brief Op Note (Signed)
09/03/2018  11:55 AM  PATIENT:  York Spaniel  47 y.o. male  PRE-OPERATIVE DIAGNOSIS:  Pseudoarthrosis  POST-OPERATIVE DIAGNOSIS:  Pseudoarthrosis  PROCEDURE:  Procedure(s) with comments: Re-explration of Lumbar Five Sacral One posterior fusion (N/A) - Re-explration of Lumbar Five Sacral One posterior fusion  SURGEON:  Surgeon(s) and Role:    * Julio Sicks, MD - Primary  PHYSICIAN ASSISTANT:   ASSISTANTSDoran Durand, NP   ANESTHESIA:   general  EBL:  25 mL   BLOOD ADMINISTERED:none  DRAINS: none   LOCAL MEDICATIONS USED:  MARCAINE     SPECIMEN:  No Specimen  DISPOSITION OF SPECIMEN:  N/A  COUNTS:  YES  TOURNIQUET:  * No tourniquets in log *  DICTATION: .Dragon Dictation  PLAN OF CARE: Admit to inpatient   PATIENT DISPOSITION:  PACU - hemodynamically stable.   Delay start of Pharmacological VTE agent (>24hrs) due to surgical blood loss or risk of bleeding: yes

## 2018-09-03 NOTE — Evaluation (Signed)
Physical Therapy Evaluation Patient Details Name: Mark Wells MRN: [id number] DOB: Mar 29, 1971 Today's Date: 09/03/2018   History of Present Illness  pt is a 47 y/o male with pmh significant for stroke with L side residual weakness, personality/mood d/o, HTN, admitted for elective surgical intervention for left back and sacral/buttock pain.  Pt s/p reexploration of old L5/S1 fusion with fixation.  Fusion intact and ? pain inducing hardware removed.  Clinical Impression  Pt admitted with/for lumbar surgery related above.  Pt is moving at a min guard assist level.  Pt currently limited functionally due to the problems listed below.  (see problems list.)  Pt will benefit from PT to maximize function and safety to be able to get home safely with available assist.     Follow Up Recommendations No PT follow up    Equipment Recommendations  None recommended by PT    Recommendations for Other Services       Precautions / Restrictions Precautions Precautions: Fall;Back Restrictions Weight Bearing Restrictions: No      Mobility  Bed Mobility Overal bed mobility: Needs Assistance Bed Mobility: Rolling;Sidelying to Sit;Sit to Sidelying Rolling: Min guard Sidelying to sit: Min guard     Sit to sidelying: Min guard General bed mobility comments: cued for best technique and pt demo'd  Transfers Overall transfer level: Needs assistance   Transfers: Sit to/from Stand Sit to Stand: Min guard            Ambulation/Gait Ambulation/Gait assistance: Min guard;Min assist Gait Distance (Feet): 190 Feet Assistive device: Straight cane Gait Pattern/deviations: Step-to pattern   Gait velocity interpretation: <1.31 ft/sec, indicative of household ambulator General Gait Details: stable L hemiparetic gait.  with noticeable fatigue with long distances and std cane not giving pt enough assist for scanning.  Stairs            Wheelchair Mobility    Modified Rankin (Stroke Patients  Only)       Balance Overall balance assessment: Needs assistance Sitting-balance support: No upper extremity supported Sitting balance-Leahy Scale: Fair       Standing balance-Leahy Scale: Poor Standing balance comment: able to maintain stance without assist with AD                             Pertinent Vitals/Pain Pain Assessment: Faces Faces Pain Scale: Hurts little more Pain Location: incision Pain Descriptors / Indicators: Discomfort;Guarding Pain Intervention(s): Monitored during session    Home Living Family/patient expects to be discharged to:: Private residence Living Arrangements: Other relatives;Parent Available Help at Discharge: Family;Available 24 hours/day Type of Home: House Home Access: Ramped entrance     Home Layout: One level Home Equipment: Shower seat;Bedside commode;Hand held shower head(hemiwalker)      Prior Function Level of Independence: Needs assistance   Gait / Transfers Assistance Needed: able to use HW to be a household ambulator.  ADL's / Homemaking Assistance Needed: Tries do do ADL's himself.  Does not complete 100%        Hand Dominance   Dominant Hand: Right    Extremity/Trunk Assessment   Upper Extremity Assessment Upper Extremity Assessment: LUE deficits/detail LUE Deficits / Details: hemiparetic    Lower Extremity Assessment Lower Extremity Assessment: LLE deficits/detail;Overall WFL for tasks assessed LLE Deficits / Details: moves in synergy, but with some isolation LLE Coordination: decreased fine motor       Communication   Communication: No difficulties  Cognition Arousal/Alertness: Awake/alert Behavior During Therapy:  WFL for tasks assessed/performed Overall Cognitive Status: Within Functional Limits for tasks assessed Area of Impairment: Attention                                      General Comments General comments (skin integrity, edema, etc.): Advised following back  precaution, lifting restrictions and progression of activity.    Exercises     Assessment/Plan    PT Assessment Patient needs continued PT services  PT Problem List Decreased activity tolerance;Decreased balance;Decreased mobility;Decreased strength;Decreased knowledge of precautions;Pain       PT Treatment Interventions Gait training;DME instruction;Functional mobility training;Therapeutic activities;Patient/family education    PT Goals (Current goals can be found in the Care Plan section)  Acute Rehab PT Goals Patient Stated Goal: Mobilize better than before surgery Time For Goal Achievement: 09/04/18 Potential to Achieve Goals: Good    Frequency Min 5X/week   Barriers to discharge        Co-evaluation               AM-PAC PT "6 Clicks" Daily Activity  Outcome Measure Difficulty turning over in bed (including adjusting bedclothes, sheets and blankets)?: A Little Difficulty moving from lying on back to sitting on the side of the bed? : A Little Difficulty sitting down on and standing up from a chair with arms (e.g., wheelchair, bedside commode, etc,.)?: A Little Help needed moving to and from a bed to chair (including a wheelchair)?: A Little Help needed walking in hospital room?: A Little Help needed climbing 3-5 steps with a railing? : A Lot 6 Click Score: 17    End of Session   Activity Tolerance: Patient tolerated treatment well Patient left: in bed;with call bell/phone within reach Nurse Communication: Mobility status PT Visit Diagnosis: Other abnormalities of gait and mobility (R26.89);Pain;Hemiplegia and hemiparesis Hemiplegia - Right/Left: Left Hemiplegia - caused by: Cerebral infarction Pain - part of body: (back)    Time: 1610-9604 PT Time Calculation (min) (ACUTE ONLY): 32 min   Charges:   PT Evaluation $PT Eval Moderate Complexity: 1 Mod PT Treatments $Gait Training: 8-22 mins        09/03/2018  [place] [facility], PT Acute Rehabilitation  Services [phone]  (pager) [phone]  (office)  Eliseo Gum Marijayne Rauth 09/03/2018, 7:02 PM

## 2018-09-03 NOTE — Op Note (Signed)
Date of procedure: 09/03/2018  Date of dictation: Same  Service: Neurosurgery  Preoperative diagnosis: L5-S1 painful hardware  Postoperative diagnosis: Same  Procedure Name: Reexploration of L5-S1 posterior lateral fusion with removal of painful, failed hardware  Surgeon:Sandeep Radell A.Shirelle Tootle, M.D.  Asst. Surgeon: Doran Durand, NP  Anesthesia: General  Indication: 47 year old male status post prior L5-S1 anterior posterior lumbar fusion L5-S1 by another physician at another institution presents with ongoing mechanical back pain.  Work-up demonstrates evidence of fracturing of his L5 and S1 pedicle screws on the left side and disconnection of his L5-S1 construct on the right side.  The patient has appeared to form a recently solid anterior fusion mass at L5-S1 but has symptoms secondary to his loosened rocking hardware.  Plan is for removal of painful hardware today in hopes of improving his symptoms.  Operative note: After induction of anesthesia, patient position prone on the Wilson frame and appropriately padded.  Lumbar region prepped and draped sterilely.  Incision was made para immediately bilaterally.  Screw heads of L5 and S1 were identified and dissected free bilaterally.  The universal set did not have the required screwdrivers for disassembly.  The rod had to be cut on the patient's left side and the screw head was removed.  Screw broken screws at L5 and S1 were removed on the left.  The Carolynn Comment that remained within the bone were not removed.  They were not proud or protruding into any anatomic structures other than the bone itself.  On the right side the patient's screws were able to be disassembled.  Rod was removed.  Screws at L5 and S1 were then removed.  Fusion was inspected and found to be solid.  Wounds were irrigated then closed in typical fashion.  Steri-Strips and sterile dressing were applied.  No apparent complications.  Patient tolerated the procedure well and he returns to the recovery  room postop.

## 2018-09-03 NOTE — Progress Notes (Signed)
Dentures returned to pt 

## 2018-09-04 MED ORDER — HYDROMORPHONE HCL 2 MG PO TABS: 2.0000 mg | ORAL_TABLET | ORAL | 0 refills | Status: DC | PRN

## 2018-09-04 MED ORDER — KETOROLAC TROMETHAMINE 10 MG PO TABS: 10.0000 mg | ORAL_TABLET | Freq: Four times a day (QID) | ORAL | 0 refills | Status: AC | PRN

## 2018-09-04 MED FILL — Thrombin For Soln Kit 20000 Unit: CUTANEOUS | Qty: 1 | Status: AC

## 2018-09-04 NOTE — Discharge Summary (Signed)
Physician Discharge Summary  Patient ID: Mark Wells MRN: [id number] DOB/AGE: 49-Jun-1972 49 y.o.  Admit date: 09/03/2018 Discharge date: 09/04/2018  Admission Diagnoses: Painful hardware Discharge Diagnoses:  Active Problems:   Painful orthopaedic hardware [facility])   Discharged Condition: good  Hospital Course: Patient admitted to the hospital where he underwent uncomplicated reexploration of his lumbar fusion with removal of his failed lumbar sacral hardware.  Postoperatively doing well.  Preoperative back pain much improved.  Progressing well.  Ready for discharge home.  Consults:   Significant Diagnostic Studies:   Treatments:   Discharge Exam: Blood pressure 127/86, pulse 78, temperature 99 F (37.2 C), temperature source Oral, resp. rate 18, height 5\' 6"  (1.676 m), weight 85.3 kg, SpO2 100 %. Awake and alert.  Oriented and appropriate.  Cranial nerve function intact.  Motor and sensory function of the extremities with some moderate left-sided hemiparesis which is stable.  Wounds clean and dry.  Chest and abdomen benign.  Disposition: Discharge disposition: 01-Home or Self Care        Allergies as of 09/04/2018      Reactions   Aspirin Other (See Comments)   Has Gastric Ulcer   Ibuprofen    Ulcers- bleeding   Iodine Swelling   Shellfish Allergy Swelling      Medication List    STOP taking these medications   oxyCODONE-acetaminophen 7.5-325 MG tablet Commonly known as:  PERCOCET   traMADol 50 MG tablet Commonly known as:  ULTRAM     TAKE these medications   amLODipine 5 MG tablet Commonly known as:  NORVASC Take 5 mg by mouth daily.   atorvastatin 40 MG tablet Commonly known as:  LIPITOR Take 40 mg by mouth every evening.   baclofen 20 MG tablet Commonly known as:  LIORESAL Take 20 mg by mouth 4 (four) times daily.   chlorthalidone 25 MG tablet Commonly known as:  HYGROTON Take 25 mg by mouth daily.   clopidogrel 75 MG tablet Commonly known as:   PLAVIX Take 75 mg by mouth daily.   HYDROmorphone 2 MG tablet Commonly known as:  DILAUDID Take 1 tablet (2 mg total) by mouth every 4 (four) hours as needed for severe pain.   ketorolac 10 MG tablet Commonly known as:  TORADOL Take 1 tablet (10 mg total) by mouth every 6 (six) hours as needed for up to 14 days.   losartan 100 MG tablet Commonly known as:  COZAAR Take 100 mg by mouth daily.   predniSONE 50 MG tablet Commonly known as:  DELTASONE Take 1 tablet (50 mg total) by mouth daily with breakfast.   pregabalin 300 MG capsule Commonly known as:  LYRICA Take 300 mg by mouth 2 (two) times daily.        SignedKathaleen Maser Rhiannan Kievit 09/04/2018, 10:02 AM

## 2018-09-04 NOTE — Evaluation (Signed)
Occupational Therapy Evaluation Patient Details Name: Mark Wells MRN: [id number] DOB: 02/08/1971 Today's Date: 09/04/2018    History of Present Illness pt is a 47 y/o male with pmh significant for stroke with L side residual weakness, personality/mood d/o, HTN, admitted for elective surgical intervention for left back and sacral/buttock pain.  Pt s/p reexploration of old L5/S1 fusion with fixation.  Fusion intact and ? pain inducing hardware removed.   Clinical Impression   PTA, pt was ambulating with hemiwalker and had occasional assistance for ADL participation. Pt currently requiring min assist for LB ADL, min guard assist for toilet transfers, and supervision with cues for back precautions during standing grooming tasks. Pt educated concerning back precautions related to ADL participation and compensatory strategies to adhere to these but he requires continued cues throughout activities to adhere to these. Pt would benefit from continued OT services while admitted to improve independence and safety with ADL and functional mobility. Recommend 24 hour assistance from family post-acute D/C. OT will continue to follow while admitted.     Follow Up Recommendations  Supervision/Assistance - 24 hour    Equipment Recommendations  None recommended by OT    Recommendations for Other Services       Precautions / Restrictions Precautions Precautions: Fall;Back Precaution Booklet Issued: Yes (comment) Precaution Comments: Pt able to recall 2/3 back precautions.  Restrictions Weight Bearing Restrictions: No      Mobility Bed Mobility               General bed mobility comments: OOB in chair on my arrival.   Transfers Overall transfer level: Needs assistance Equipment used: Straight cane Transfers: Sit to/from Stand Sit to Stand: Min guard         General transfer comment: Guarding assist for safety.     Balance Overall balance assessment: Needs  assistance Sitting-balance support: No upper extremity supported Sitting balance-Leahy Scale: Fair     Standing balance support: No upper extremity supported;During functional activity Standing balance-Leahy Scale: Fair Standing balance comment: able to statically stand without UE support but requires UE support for dynamic tasks.                            ADL either performed or assessed with clinical judgement   ADL Overall ADL's : Needs assistance/impaired Eating/Feeding: Set up;Sitting   Grooming: Supervision/safety;Standing Grooming Details (indicate cue type and reason): cues to avoid bending Upper Body Bathing: Supervision/ safety;Standing   Lower Body Bathing: Sit to/from stand;Minimal assistance Lower Body Bathing Details (indicate cue type and reason): cues to avoid bending Upper Body Dressing : Supervision/safety;Sitting Upper Body Dressing Details (indicate cue type and reason): cues for hemidressing techniques Lower Body Dressing: Sit to/from stand;Minimal assistance   Toilet Transfer: Min guard(with single point cane)   Toileting- Clothing Manipulation and Hygiene: Sit to/from stand;Min Print production planner Details (indicate cue type and reason): educated pt concerning need for use of walk-in shower rather than tub shower while he is unstable Functional mobility during ADLs: Min guard;Cane General ADL Comments: Pt requiring cues for safe performance of ADL tasks.      Vision Patient Visual Report: No change from baseline Vision Assessment?: No apparent visual deficits     Perception     Praxis      Pertinent Vitals/Pain Pain Assessment: Faces Faces Pain Scale: Hurts little more Pain Location: incision Pain Descriptors / Indicators: Discomfort;Guarding Pain Intervention(s): Limited activity within patient's tolerance;Monitored  during session;Repositioned     Hand Dominance Right   Extremity/Trunk Assessment Upper Extremity  Assessment Upper Extremity Assessment: LUE deficits/detail LUE Deficits / Details: Deficits from previous stroke. Synnergy noted throughout.  LUE Coordination: decreased fine motor;decreased gross motor   Lower Extremity Assessment Lower Extremity Assessment: Defer to PT evaluation       Communication Communication Communication: No difficulties   Cognition Arousal/Alertness: Awake/alert Behavior During Therapy: WFL for tasks assessed/performed Overall Cognitive Status: No family/caregiver present to determine baseline cognitive functioning Area of Impairment: Attention;Problem solving                   Current Attention Level: Selective         Problem Solving: Slow processing General Comments: Pt very easily distracted throughout session.    General Comments       Exercises     Shoulder Instructions      Home Living Family/patient expects to be discharged to:: Private residence Living Arrangements: Other relatives;Parent Available Help at Discharge: Family;Available 24 hours/day Type of Home: House Home Access: Ramped entrance     Home Layout: One level     Bathroom Shower/Tub: Tub/shower unit;Walk-in shower(mother has walk-in shower)   Bathroom Toilet: Standard     Home Equipment: Shower seat;Bedside commode;Hand held shower head;Adaptive equipment(hemiwalker) Adaptive Equipment: Reacher        Prior Functioning/Environment Level of Independence: Needs assistance  Gait / Transfers Assistance Needed: using hemiwalker for household mobility ADL's / Homemaking Assistance Needed: Occasional assist for ADL's.             OT Problem List: Decreased strength;Decreased range of motion;Impaired balance (sitting and/or standing);Decreased activity tolerance;Decreased safety awareness;Decreased knowledge of use of DME or AE;Decreased knowledge of precautions;Decreased cognition;Pain      OT Treatment/Interventions: Self-care/ADL training;Therapeutic  exercise;Energy conservation;DME and/or AE instruction;Therapeutic activities;Patient/family education;Balance training    OT Goals(Current goals can be found in the care plan section) Acute Rehab OT Goals Patient Stated Goal: Mobilize better than before surgery OT Goal Formulation: With patient Time For Goal Achievement: 09/18/18 Potential to Achieve Goals: Good ADL Goals Pt Will Perform Grooming: with modified independence;standing Pt Will Perform Lower Body Dressing: with modified independence;sit to/from stand Pt Will Transfer to Toilet: with modified independence;ambulating Pt Will Perform Toileting - Clothing Manipulation and hygiene: with modified independence;sit to/from stand Pt Will Perform Tub/Shower Transfer: with modified independence;ambulating;shower seat;Shower transfer(with cane)  OT Frequency: Min 2X/week   Barriers to D/C:            Co-evaluation              AM-PAC PT "6 Clicks" Daily Activity     Outcome Measure Help from another person eating meals?: None Help from another person taking care of personal grooming?: None Help from another person toileting, which includes using toliet, bedpan, or urinal?: A Little Help from another person bathing (including washing, rinsing, drying)?: A Little Help from another person to put on and taking off regular upper body clothing?: None Help from another person to put on and taking off regular lower body clothing?: A Little 6 Click Score: 21   End of Session Equipment Utilized During Treatment: (cane) Nurse Communication: Mobility status;Patient requests pain meds(pt requests nocotine patch)  Activity Tolerance: Patient tolerated treatment well Patient left: in chair;with call bell/phone within reach  OT Visit Diagnosis: Other abnormalities of gait and mobility (R26.89);Pain;Hemiplegia and hemiparesis Hemiplegia - Right/Left: Left Hemiplegia - caused by: Cerebral infarction Pain - part of body: (back)  Time: 1610-9604 OT Time Calculation (min): 46 min Charges:  OT General Charges $OT Visit: 1 Visit OT Evaluation $OT Eval Moderate Complexity: 1 Mod OT Treatments $Self Care/Home Management : 23-37 mins  Derrell Lolling, OTR/L Acute Rehabilitation Services Pager [phone] Office [phone]   [facility] A Joachim Carton 09/04/2018, 9:24 AM

## 2018-09-04 NOTE — Discharge Instructions (Signed)

## 2018-09-04 NOTE — Progress Notes (Signed)
Physical Therapy Treatment Patient Details Name: Mark Wells MRN: [id number] DOB: 04-12-1971 Today's Date: 09/04/2018    History of Present Illness pt is a 47 y/o male with pmh significant for stroke with L side residual weakness, personality/mood d/o, HTN, admitted for elective surgical intervention for left back and sacral/buttock pain.  Pt s/p reexploration of old L5/S1 fusion with fixation.  Fusion intact and ? pain inducing hardware removed.    PT Comments    Pt continues to make slow progress towards goals,performing ambulation with gross min G- min A with SPC for safety. Pt educated on car transfers, generalized walking program, and overall safety with mobility as pt demonstrates decreased safety awareness. Pt reports improvements in symptoms in LLE, but continues to demonstrate obvious deficits. Recommending pt utilize [facility] to improve steadiness and decrease risk of falls reported with hemiwalker, secondary to tripping over wide base at baseline. Will continue to follow acutely.    Follow Up Recommendations  No PT follow up;Supervision for mobility/OOB     Equipment Recommendations  Cane    Recommendations for Other Services       Precautions / Restrictions Precautions Precautions: Fall;Back Precaution Booklet Issued: Yes (comment) Precaution Comments: Reviewed precautions with pt. Unable to recall precautions without cues. Restrictions Weight Bearing Restrictions: No    Mobility  Bed Mobility Overal bed mobility: Needs Assistance             General bed mobility comments: seated EOB upon my arrival  Transfers Overall transfer level: Needs assistance Equipment used: Straight cane Transfers: Sit to/from Stand Sit to Stand: Min guard;Min assist         General transfer comment: Min A required intially to steady as pt refused to use SPC to stand. Pt utilized SPC at end of session with sit<>stand progressing to min G.   Ambulation/Gait Ambulation/Gait  assistance: Min guard;Min assist Gait Distance (Feet): 150 Feet Assistive device: Straight cane Gait Pattern/deviations: Step-to pattern;Step-through pattern;Decreased step length - left;Decreased stance time - left;Decreased weight shift to left Gait velocity: decreased Gait velocity interpretation: <1.8 ft/sec, indicate of risk for recurrent falls General Gait Details: Pt requiring Min A with LOB x2, secondary to not utilzing SPC correcly when distracted. Min G with correct sequencing of SPC. Pt presenting with decreased safety awareness throughout session, verbalizing he did not want me guarding to closely. Verbally discussed ascending ramp sideways into house as pt reports he is fearful of falling as he has before with hemi walker.    Stairs             Wheelchair Mobility    Modified Rankin (Stroke Patients Only)       Balance Overall balance assessment: Needs assistance Sitting-balance support: No upper extremity supported Sitting balance-Leahy Scale: Fair     Standing balance support: Single extremity supported;During functional activity Standing balance-Leahy Scale: Fair Standing balance comment: Pt required at least single UE support for static and dynamic activities                            Cognition Arousal/Alertness: Awake/alert Behavior During Therapy: Impulsive Overall Cognitive Status: No family/caregiver present to determine baseline cognitive functioning Area of Impairment: Attention;Problem solving                   Current Attention Level: Selective         Problem Solving: Slow processing General Comments: Pt unable to sustain attention during session. Pt Verbalized he  did not need help throughout session or once home, but demonstrating decreased safety with ambulation and problem solving.      Exercises      General Comments        Pertinent Vitals/Pain Pain Assessment: 0-10 Pain Score: 6  Faces Pain Scale: Hurts  little more Pain Location: incision Pain Descriptors / Indicators: Discomfort;Grimacing;Heaviness Pain Intervention(s): Limited activity within patient's tolerance;Monitored during session;Repositioned    Home Living Family/patient expects to be discharged to:: Private residence Living Arrangements: Other relatives;Parent Available Help at Discharge: Family;Available 24 hours/day Type of Home: House Home Access: Ramped entrance   Home Layout: One level Home Equipment: Shower seat;Bedside commode;Hand held shower head;Adaptive equipment(hemiwalker)      Prior Function Level of Independence: Needs assistance  Gait / Transfers Assistance Needed: using hemiwalker for household mobility ADL's / Homemaking Assistance Needed: Occasional assist for ADL's.      PT Goals (current goals can now be found in the care plan section) Acute Rehab PT Goals Patient Stated Goal: Mobilize better than before surgery Time For Goal Achievement: 09/04/18 Potential to Achieve Goals: Good Progress towards PT goals: Progressing toward goals    Frequency    Min 5X/week      PT Plan Current plan remains appropriate    Co-evaluation              AM-PAC PT "6 Clicks" Daily Activity  Outcome Measure  Difficulty turning over in bed (including adjusting bedclothes, sheets and blankets)?: A Little Difficulty moving from lying on back to sitting on the side of the bed? : A Little Difficulty sitting down on and standing up from a chair with arms (e.g., wheelchair, bedside commode, etc,.)?: Unable Help needed moving to and from a bed to chair (including a wheelchair)?: A Little Help needed walking in hospital room?: A Little Help needed climbing 3-5 steps with a railing? : A Lot 6 Click Score: 15    End of Session Equipment Utilized During Treatment: Gait belt Activity Tolerance: Patient tolerated treatment well Patient left: in chair;with call bell/phone within reach Nurse Communication:  Mobility status PT Visit Diagnosis: Unsteadiness on feet (R26.81);Other abnormalities of gait and mobility (R26.89);Muscle weakness (generalized) (M62.81);Difficulty in walking, not elsewhere classified (R26.2);Pain Hemiplegia - Right/Left: Left Pain - part of body: (back)     Time: 0722-0749 PT Time Calculation (min) (ACUTE ONLY): 27 min  Charges:  $Gait Training: 23-37 mins                     Donzetta Kohut, [place]  Student Physical Therapist Acute Rehab [phone]    Donzetta Kohut 09/04/2018, 11:39 AM

## 2018-09-04 NOTE — Progress Notes (Signed)
Patient is discharged from room 3C09 at this time. Alert and in stable condition. IV site d/c'd and instructions read to patient with understanding verbalized. Left unit via wheelchair with all belongings at side.  

## 2018-09-07 ENCOUNTER — Emergency Department (HOSPITAL_COMMUNITY): Payer: Medicare HMO

## 2018-09-07 ENCOUNTER — Encounter (HOSPITAL_COMMUNITY): Payer: Self-pay | Admitting: Pharmacy Technician

## 2018-09-07 ENCOUNTER — Other Ambulatory Visit: Payer: Self-pay

## 2018-09-07 ENCOUNTER — Emergency Department (HOSPITAL_COMMUNITY)
Admission: EM | Admit: 2018-09-07 | Discharge: 2018-09-08 | Disposition: A | Discharge status: Home / Self Care | Payer: Medicare HMO | Attending: Emergency Medicine | Admitting: Emergency Medicine

## 2018-09-07 DIAGNOSIS — F17210 Nicotine dependence, cigarettes, uncomplicated: Secondary | ICD-10-CM | POA: Diagnosis not present

## 2018-09-07 DIAGNOSIS — M549 Dorsalgia, unspecified: Secondary | ICD-10-CM | POA: Diagnosis present

## 2018-09-07 DIAGNOSIS — M25552 Pain in left hip: Secondary | ICD-10-CM | POA: Diagnosis not present

## 2018-09-07 DIAGNOSIS — I10 Essential (primary) hypertension: Secondary | ICD-10-CM | POA: Insufficient documentation

## 2018-09-07 DIAGNOSIS — Z79899 Other long term (current) drug therapy: Secondary | ICD-10-CM | POA: Diagnosis not present

## 2018-09-07 DIAGNOSIS — Z8673 Personal history of transient ischemic attack (TIA), and cerebral infarction without residual deficits: Secondary | ICD-10-CM | POA: Insufficient documentation

## 2018-09-07 DIAGNOSIS — F329 Major depressive disorder, single episode, unspecified: Secondary | ICD-10-CM | POA: Diagnosis not present

## 2018-09-07 DIAGNOSIS — W109XXA Fall (on) (from) unspecified stairs and steps, initial encounter: Secondary | ICD-10-CM | POA: Insufficient documentation

## 2018-09-07 DIAGNOSIS — F419 Anxiety disorder, unspecified: Secondary | ICD-10-CM | POA: Diagnosis not present

## 2018-09-07 DIAGNOSIS — W19XXXA Unspecified fall, initial encounter: Secondary | ICD-10-CM

## 2018-09-07 LAB — BASIC METABOLIC PANEL
ANION GAP: 9 (ref 5–15)
BUN: 12 mg/dL (ref 6–20)
CHLORIDE: 104 mmol/L (ref 98–111)
CO2: 28 mmol/L (ref 22–32)
Calcium: 8.7 mg/dL — ABNORMAL LOW (ref 8.9–10.3)
Creatinine, Ser: 1.02 mg/dL (ref 0.61–1.24)
GFR calc non Af Amer: >60 mL/min (ref >60)
GLUCOSE: 84 mg/dL (ref 70–99)
POTASSIUM: 4.6 mmol/L (ref 3.5–5.1)
Sodium: 141 mmol/L (ref 135–145)

## 2018-09-07 MED ORDER — FENTANYL CITRATE (PF) 100 MCG/2ML IJ SOLN
50.0000 ug | Freq: Once | INTRAMUSCULAR | Status: AC
  Administered 2018-09-07: 50 ug via INTRAVENOUS
  Filled 2018-09-07: qty 2

## 2018-09-07 MED ORDER — HYDROMORPHONE HCL 1 MG/ML IJ SOLN
1.0000 mg | Freq: Once | INTRAMUSCULAR | Status: AC
  Administered 2018-09-07: 1 mg via INTRAVENOUS
  Filled 2018-09-07: qty 1

## 2018-09-07 NOTE — ED Notes (Signed)
Patient transported to CT 

## 2018-09-07 NOTE — Progress Notes (Signed)
Patient refused MRI after PA from ER would not give pain medicine. PA explained that the patient could get medicine after MRI was completed. Patient became agitated after coming out of scanner. Patient was in the holding bay using profanity on the phone with a family member. Patient was sent back to ER and I spoke to Ryland RN about patient's behavior.

## 2018-09-07 NOTE — ED Provider Notes (Signed)
[facility] Provider Note   CSN: [id number] Arrival date & time: 09/07/18  1445     History   Chief Complaint Chief Complaint  Patient presents with  . Back Pain    HPI Mark Wells is a 47 y.o. male.  The history is provided by the patient and medical records. No language interpreter was used.  Back Pain     Mark Wells is a 47 y.o. male  with a PMH of chronic back pain who presents to the Emergency Department complaining of back pain and fall today.  Of note, patient underwent reexploration of his lumbar fusion with removal of his failed lumbar sacral hardware on 9/09.  Per chart review, his reoperative back pain was much improved and he was progressing well, therefore discharged home the following day on 9/10.  He does report improvement of his pain.  He was walking today and try to get up a step when he had pain to his left side of the back and fell on the left hip. He is now experiencing left hip pain and left low back pain. He denies any numbness, tingling, or extremity weakness. No saddle anesthesia or urinary/bowel incontinence. No head injury or LOC.   Past Medical History:  Diagnosis Date  . Anemia   . Anxiety   . Chronic back pain   . Chronic pain   . Depression   . Hemiplegia affecting left nondominant side (HCC)   . Hyperlipidemia   . Hypertension   . Mood disorder (HCC)   . Personality disorder (HCC)   . Restless leg syndrome   . Sleep apnea   . Stroke [facility](HCC)     Patient Active Problem List   Diagnosis Date Noted  . Painful orthopaedic hardware (HCC) 09/03/2018  . BACK PAIN 01/07/2011  . ADVERSE DRUG REACTION 01/01/2011  . UNSPECIFIED ESSENTIAL HYPERTENSION 12/28/2010  . LOW BACK PAIN SYNDROME, SEVERE 12/28/2010  . OTHER URINARY INCONTINENCE 12/28/2010    Past Surgical History:  Procedure Laterality Date  . BACK SURGERY          Home Medications    Prior to Admission medications   Medication Sig  Start Date End Date Taking? Authorizing Provider  baclofen (LIORESAL) 20 MG tablet Take 20 mg by mouth 4 (four) times daily. 08/20/18  Yes [provider]  HYDROmorphone (DILAUDID) 2 MG tablet Take 1 tablet (2 mg total) by mouth every 4 (four) hours as needed for severe pain. 09/04/18 09/04/19 Yes Pool, Sherilyn CooterHenry, MD  ketorolac (TORADOL) 10 MG tablet Take 1 tablet (10 mg total) by mouth every 6 (six) hours as needed for up to 14 days. 09/04/18 09/18/18 Yes Pool, Sherilyn CooterHenry, MD  pregabalin (LYRICA) 300 MG capsule Take 300 mg by mouth 2 (two) times daily.    Yes [provider]  amLODipine (NORVASC) 5 MG tablet Take 5 mg by mouth daily. 07/20/18   [provider]  atorvastatin (LIPITOR) 40 MG tablet Take 40 mg by mouth every evening.    [provider]  chlorthalidone (HYGROTON) 25 MG tablet Take 25 mg by mouth daily. 07/20/18   [provider]  clopidogrel (PLAVIX) 75 MG tablet Take 75 mg by mouth daily.    [provider]  losartan (COZAAR) 100 MG tablet Take 100 mg by mouth daily. 07/20/18   [provider]  predniSONE (DELTASONE) 50 MG tablet Take 1 tablet (50 mg total) by mouth daily with breakfast. Patient not taking: Reported on 08/30/2018 07/23/18  Cuthriell, Delorise Royals, PA-C    Family History No family history on file.  Social History Social History   Tobacco Use  . Smoking status: Current Every Day Smoker    Packs/day: 0.50    Types: Cigarettes  . Smokeless tobacco: Never Used  Substance Use Topics  . Alcohol use: No  . Drug use: No     Allergies   Aspirin; Ibuprofen; Iodine; and Shellfish allergy   Review of Systems Review of Systems  Musculoskeletal: Positive for arthralgias, back pain and myalgias.  All other systems reviewed and are negative.    Physical Exam Updated Vital Signs BP 118/84   Pulse 70   Temp 98.7 F (37.1 C) (Oral)   Resp 18   SpO2 95%   Physical Exam  Constitutional: He is oriented to person,  place, and time. He appears well-developed and well-nourished.  Neck:  No midline or paraspinal tenderness. Full ROM without pain.  Cardiovascular: Normal rate, regular rhythm, normal heart sounds and intact distal pulses.  Pulmonary/Chest: Effort normal and breath sounds normal. No respiratory distress.  Abdominal: Soft. Bowel sounds are normal. He exhibits no distension. There is no tenderness.  Musculoskeletal:       Back:  Tenderness to palpation as depicted in image as well as tenderness to left hip. 5/5 muscle strength and full ROM in bilateral LE's.  Neurological: He is alert and oriented to person, place, and time. He has normal reflexes.  Bilateral lower extremities neurovascularly intact.  Skin: Skin is warm and dry. No rash noted. No erythema.  Nursing note and vitals reviewed.    ED Treatments / Results  Labs (all labs ordered are listed, but only abnormal results are displayed) Labs Reviewed  CBC WITH DIFFERENTIAL/PLATELET  BASIC METABOLIC PANEL    EKG None  Radiology Ct Lumbar Spine Wo Contrast  Result Date: 09/07/2018 CLINICAL DATA:  Larey Seat with subsequent back pain. Left hip pain. Lumbar hardware removal earlier this week. EXAM: CT LUMBAR SPINE WITHOUT CONTRAST TECHNIQUE: Multidetector CT imaging of the lumbar spine was performed without intravenous contrast administration. Multiplanar CT image reconstructions were also generated. COMPARISON:  07/06/2018 FINDINGS: Segmentation: 5 lumbar type vertebral bodies. Alignment: Alignment remains normal. Vertebrae: No evidence of acute fracture. The patient has had recent hardware removal at L5-S1. D fragments of the left L5 and S1 screws remain in place, not significant. Paraspinal and other soft tissues: Negative Disc levels: L1-2: Minimal disc bulge. Mild facet degeneration. No stenosis. L2-3: Mild disc bulge, focally prominent in the left foramen. Mild facet degeneration. No stenosis. L3-4: Moderate disc bulge. Mild facet  degeneration. Mild narrowing of the lateral recesses and foramina without visible neural compression. L4-5: Moderate disc bulge. Mild narrowing of the lateral recesses and foramina, worse on the left. L5-S1: Hardware removal as described above with residual screw fragments on the left at L5 and S1, not significant. No complicating feature related to the recent hardware removal. There is some nitrogen gas adjacent to the interbody spacer, suggesting that there may be nonunion. Sacroiliac joints show mild osteoarthritis. IMPRESSION: No traumatic finding relating to the fall. Recent hardware removal at L5-S1. Residual screw fragments on the left at L5 and S1, not significant. Some nitrogen gas in the disc space raises the possibility of ongoing nonunion. Degenerative changes higher in the lumbar spine, with lateral recess and foraminal narrowing at L3-4 and L4-5 that could potentially be symptomatic. Electronically Signed   By: Paulina Fusi M.D.   On: 09/07/2018 17:15   Ct  Hip Left Wo Contrast  Result Date: 09/07/2018 CLINICAL DATA:  Larey Seat.  Left hip pain. EXAM: CT OF THE LEFT HIP WITHOUT CONTRAST TECHNIQUE: Multidetector CT imaging of the left hip was performed according to the standard protocol. Multiplanar CT image reconstructions were also generated. COMPARISON:  CT scan 10/25/2017 FINDINGS: The left hip is normally located. Mild to moderate degenerative changes for age. Possible subtle cortical fracture involving the inferior aspect of the femoral neck. Recommend MR left hip for further evaluation. No intertrochanteric fracture. No AVN. The acetabulum is intact. The visualized left hemipelvis is intact No significant intrapelvic abnormalities. No inguinal mass or hernia. IMPRESSION: 1. Suspect subtle left femoral neck fracture. Recommend MR for further evaluation. 2. Mild to moderate hip joint degenerative changes for age. 3. The visualized right hemipelvis is intact. Electronically Signed   By: Rudie Meyer  M.D.   On: 09/07/2018 17:27    Procedures Procedures (including critical care time)  Medications Ordered in ED Medications  HYDROmorphone (DILAUDID) injection 1 mg (1 mg Intravenous Given 09/07/18 1507)     Initial Impression / Assessment and Plan / ED Course  I have reviewed the triage vital signs and the nursing notes.  Pertinent labs & imaging results that were available during my care of the patient were reviewed by me and considered in my medical decision making (see chart for details).    Mark Wells is a 47 y.o. male who presents to ED for left-sided back pain leading to a fall onto her left back and hip.  On exam, patient is afebrile, hemodynamically stable with tenderness to palpation to the left hip and back.  He did have exploration of his L-spine with hardware removal by Dr. Dutch Quint on 9/09.  He felt as if he was healing well, however said that he believes he did a little more activity than he should have done, leading to pain and ultimately his fall.  His lower extremities are neurovascularly intact.  He is sitting no red flag symptoms of back pain.  CT of the L-spine and hip was obtained.  CT of the L-spine shows no traumatic finding related to his fall.  It does show recent hardware removal and residual screw fragments as well as some nitrogen gas in the disc space raising the possibility of ongoing nonunion.  I discussed these findings with neurosurgery, Dr. Maurice Small, who recommends continuing with scheduled follow up as planned. Nothing further that needs to be done from Neurosurg standpoint. CT of the left hip shows subtle left femoral neck fx. Radiology recommending MRI for further eval. I discussed case with orthopedics, Dr. Roda Shutters, who recommends obtaining MRI and reconsult when we have results. MRI pending at shift change. Care assumed by oncoming provider, PA Sanders. Case discussed, plan agreed upon. Will follow up on pending MR hip and CBC.    Final Clinical Impressions(s)  / ED Diagnoses   Final diagnoses:  None    ED Discharge Orders    None       Ward, Chase Picket, PA-C 09/07/18 2215    Lorre Nick, MD 09/09/18 [phone]

## 2018-09-07 NOTE — ED Triage Notes (Signed)
Pt arrives via EMS from home with reports of fall on his side, no LOC. Denies hitting head. Pt with back pain after having hardware removed earlier this week. VSS. 20g LFA. Fentanyl given PTA. Pt with bandage still in place from having hardware removed. 121/83, HR 78, 99% RA. Pt states +blood thinner, unable to recall name.

## 2018-09-07 NOTE — ED Notes (Signed)
Called MRI, they are aware pt is ready for MRI

## 2018-09-07 NOTE — ED Provider Notes (Signed)
Assumed care from PA Ward at shift change.  See prior notes for full H&P.  Briefly, 47 y.o. M with recent lumbar hardware removed by Dr. Jordan LikesPool a few days ago.  He fell and tried to get up a step and fell on left hip.  CT with suspected femoral neck fracture.  Cleared from neurosurgery standpoint.  Discussed with Dr. Roda ShuttersXu, recommends MRI and call back with results.  Plan:  MRI hip pending.  Delay in MRI-- patient was taken up for MRI, however brought back without scan being performed as tech reports he got aggressive and hostile with staff, cursing.  Upon returning to ED room, patient reports he got upset that he was moved to MRI table abruptly and caused him too much pain.  He requested pain medication, was told he would be given this upon returning to ED however could not wait so was brought back.  Patient given dose of fentanyl on return to room, stressed importance of obtaining MRI given CT findings, agrees to comply.  3:27 AM Called from radiology, Dr. Marisue HumbleSanford-- preliminary report that MRI is negative for acute fracture, some soft tissue edema noted.  Official report will be published in the morning once [facility] radiologist arrives.  Results discussed with patient.  Has PO dilaudid from neurosurgery, Dr. Jordan LikesPool, for pain control.  He can follow-up as an outpatient. He will return here for any new/acute changes.   Garlon HatchetSanders, Derrica Sieg M, PA-C 09/08/18 0500    Terrilee FilesButler, Michael C, MD 09/08/18 [phone]

## 2018-09-08 ENCOUNTER — Emergency Department (HOSPITAL_COMMUNITY): Payer: Medicare HMO

## 2018-09-08 DIAGNOSIS — M25552 Pain in left hip: Secondary | ICD-10-CM | POA: Diagnosis not present

## 2018-09-08 LAB — CBC WITH DIFFERENTIAL/PLATELET
ABS IMMATURE GRANULOCYTES: 0 10*3/uL (ref 0.0–0.1)
Basophils Absolute: 0 10*3/uL (ref 0.0–0.1)
Basophils Relative: 0 %
EOS ABS: 0.1 10*3/uL (ref 0.0–0.7)
Eosinophils Relative: 2 %
HEMATOCRIT: 39.9 % (ref 39.0–52.0)
Hemoglobin: 12.6 g/dL — ABNORMAL LOW (ref 13.0–17.0)
IMMATURE GRANULOCYTES: 0 %
LYMPHS ABS: 1.9 10*3/uL (ref 0.7–4.0)
Lymphocytes Relative: 27 %
MCH: 30.1 pg (ref 26.0–34.0)
MCHC: 31.6 g/dL (ref 30.0–36.0)
MCV: 95.2 fL (ref 78.0–100.0)
MONOS PCT: 11 %
Monocytes Absolute: 0.8 10*3/uL (ref 0.1–1.0)
NEUTROS PCT: 60 %
Neutro Abs: 4.3 10*3/uL (ref 1.7–7.7)
Platelets: 226 10*3/uL (ref 150–400)
RBC: 4.19 MIL/uL — ABNORMAL LOW (ref 4.22–5.81)
RDW: 12.6 % (ref 11.5–15.5)
WBC: 7.1 10*3/uL (ref 4.0–10.5)

## 2018-09-08 MED ORDER — OXYCODONE-ACETAMINOPHEN 5-325 MG PO TABS
1.0000 | ORAL_TABLET | Freq: Once | ORAL | Status: AC
  Administered 2018-09-08: 1 via ORAL
  Filled 2018-09-08: qty 1

## 2018-09-08 NOTE — ED Notes (Signed)
Pt back from MRI 

## 2018-09-08 NOTE — ED Notes (Signed)
Patient transported to MRI 

## 2018-09-08 NOTE — Discharge Instructions (Signed)
MRI today was negative. Continue your home pain medications from Dr. Jordan LikesPool. Follow-up with your primary care doctor. Return here for any new/acute changes.

## 2018-09-08 NOTE — Progress Notes (Signed)
Ok for non contrast exam per ordering Dr.

## 2018-09-13 ENCOUNTER — Emergency Department
Admission: EM | Admit: 2018-09-13 | Discharge: 2018-09-13 | Disposition: A | Discharge status: Home / Self Care | Payer: Medicare HMO | Attending: Emergency Medicine | Admitting: Emergency Medicine

## 2018-09-13 ENCOUNTER — Other Ambulatory Visit: Payer: Self-pay

## 2018-09-13 ENCOUNTER — Encounter: Payer: Self-pay | Admitting: Emergency Medicine

## 2018-09-13 DIAGNOSIS — Z8673 Personal history of transient ischemic attack (TIA), and cerebral infarction without residual deficits: Secondary | ICD-10-CM | POA: Insufficient documentation

## 2018-09-13 DIAGNOSIS — E785 Hyperlipidemia, unspecified: Secondary | ICD-10-CM | POA: Insufficient documentation

## 2018-09-13 DIAGNOSIS — F17210 Nicotine dependence, cigarettes, uncomplicated: Secondary | ICD-10-CM | POA: Insufficient documentation

## 2018-09-13 DIAGNOSIS — G8928 Other chronic postprocedural pain: Secondary | ICD-10-CM

## 2018-09-13 DIAGNOSIS — I10 Essential (primary) hypertension: Secondary | ICD-10-CM | POA: Diagnosis not present

## 2018-09-13 LAB — COMPREHENSIVE METABOLIC PANEL
ALT: 17 U/L (ref 0–44)
AST: 18 U/L (ref 15–41)
Albumin: 3.9 g/dL (ref 3.5–5.0)
Alkaline Phosphatase: 55 U/L (ref 38–126)
Anion gap: 10 (ref 5–15)
BUN: 9 mg/dL (ref 6–20)
CHLORIDE: 104 mmol/L (ref 98–111)
CO2: 27 mmol/L (ref 22–32)
CREATININE: 0.77 mg/dL (ref 0.61–1.24)
Calcium: 9.1 mg/dL (ref 8.9–10.3)
GFR calc Af Amer: >60 mL/min (ref >60)
GLUCOSE: 88 mg/dL (ref 70–99)
Potassium: 3.4 mmol/L — ABNORMAL LOW (ref 3.5–5.1)
SODIUM: 141 mmol/L (ref 135–145)
Total Bilirubin: 0.7 mg/dL (ref 0.3–1.2)
Total Protein: 6.7 g/dL (ref 6.5–8.1)

## 2018-09-13 NOTE — ED Notes (Signed)
Pt refused his discharge papers - taken out to his friends who had brought him to the ed.

## 2018-09-13 NOTE — ED Triage Notes (Signed)
Patient to ER for c/o chronic back pain. States he is out of his medication and has not been able to get refill or see his doctor yet. Had surgery to back on 09/03/18 to have pins removed from hardware.  Patient also reports swelling to left hand, and both feet x2 days. States he has been having trouble getting his shoes on.

## 2018-09-13 NOTE — ED Provider Notes (Signed)
[facility] Emergency Department Provider Note       Time seen: ----------------------------------------- 6:46 PM on 09/13/2018 -----------------------------------------   I have reviewed the triage vital signs and the nursing notes.  HISTORY   Chief Complaint Pain and Leg Swelling    HPI York SpanielRonnie Maltos is a 47 y.o. male with a history of anemia, chronic back pain, depression, hyperlipidemia, hypertension, personality disorder, sleep apnea, stroke who presents to the ED for chronic pain.  Patient states he is out of his medication and has not been able to get a refill or see his doctor yet.  He has surgery 10 days ago to have pins removed from previously placed hardware.  Patient also reports some swelling to his left hand of both feet for the past 2 days.  She states he missed his appointment that he was due for today but has rescheduled one for Tuesday.  He was previous he taken Toradol and Dilaudid but has run out of both.  Past Medical History:  Diagnosis Date  . Anemia   . Anxiety   . Chronic back pain   . Chronic pain   . Depression   . Hemiplegia affecting left nondominant side (HCC)   . Hyperlipidemia   . Hypertension   . Mood disorder (HCC)   . Personality disorder (HCC)   . Restless leg syndrome   . Sleep apnea   . Stroke [facility](HCC)     Patient Active Problem List   Diagnosis Date Noted  . Painful orthopaedic hardware (HCC) 09/03/2018  . BACK PAIN 01/07/2011  . ADVERSE DRUG REACTION 01/01/2011  . UNSPECIFIED ESSENTIAL HYPERTENSION 12/28/2010  . LOW BACK PAIN SYNDROME, SEVERE 12/28/2010  . OTHER URINARY INCONTINENCE 12/28/2010    Past Surgical History:  Procedure Laterality Date  . BACK SURGERY      Allergies Aspirin; Ibuprofen; Iodine; and Shellfish allergy  Social History Social History   Tobacco Use  . Smoking status: Current Every Day Smoker    Packs/day: 0.50    Types: Cigarettes  . Smokeless tobacco: Never Used   Substance Use Topics  . Alcohol use: No  . Drug use: No   Review of Systems Constitutional: Negative for fever. Cardiovascular: Negative for chest pain. Respiratory: Negative for shortness of breath. Gastrointestinal: Negative for abdominal pain Musculoskeletal: Positive for back pain, edema Skin: Negative for rash. Neurological: Negative for headaches, focal weakness or numbness.  All systems negative/normal/unremarkable except as stated in the HPI  ____________________________________________   PHYSICAL EXAM:  VITAL SIGNS: ED Triage Vitals  Enc Vitals Group     BP 09/13/18 1826 126/85     Pulse Rate 09/13/18 1826 84     Resp 09/13/18 1826 20     Temp 09/13/18 1826 98.6 F (37 C)     Temp Source 09/13/18 1826 Oral     SpO2 09/13/18 1826 99 %     Weight 09/13/18 1828 188 lb 0.8 oz (85.3 kg)     Height 09/13/18 1828 5\' 6"  (1.676 m)     Head Circumference --      Peak Flow --      Pain Score 09/13/18 1827 10     Pain Loc --      Pain Edu? --      Excl. in GC? --    Constitutional: Drowsy but oriented, no distress Cardiovascular: Normal rate, regular rhythm. No murmurs, rubs, or gallops. Respiratory: Normal respiratory effort without tachypnea nor retractions. Breath sounds are clear and equal bilaterally. No  wheezes/rales/rhonchi. Gastrointestinal: Soft and nontender. Normal bowel sounds Musculoskeletal: Nontender with normal range of motion in extremities.  Mild edema is noted Neurologic:   No gross focal neurologic deficits are appreciated.  Skin:  Skin is warm, dry and intact. No rash noted. Psychiatric: Mood and affect are normal. ____________________________________________  ED COURSE:  As part of my medical decision making, I reviewed the following data within the electronic MEDICAL RECORD NUMBER History obtained from family if available, nursing notes, old chart and ekg, as well as notes from prior ED visits. Patient presented for chronic pain requesting narcotic  refill, have advised that we cannot provide this for him today.   Procedures ____________________________________________   LABS (pertinent positives/negatives)  Labs Reviewed  COMPREHENSIVE METABOLIC PANEL  CBC WITH DIFFERENTIAL/PLATELET   ___________________________________________  DIFFERENTIAL DIAGNOSIS   Chronic pain, medication noncompliance, postoperative pain  FINAL ASSESSMENT AND PLAN  Chronic pain   Plan: The patient had presented for refill of his narcotics.  We have advised we are unable to provide this service from today.  Of advised that he discuss this with his back surgeon tomorrow.   Ulice Dash, MD   Note: This note was generated in part or whole with voice recognition software. Voice recognition is usually quite accurate but there are transcription errors that can and very often do occur. I apologize for any typographical errors that were not detected and corrected.     Emily Filbert, MD 09/13/18 [phone]

## 2018-10-10 ENCOUNTER — Ambulatory Visit: Payer: Medicare HMO | Admitting: Physical Therapy

## 2018-10-15 ENCOUNTER — Ambulatory Visit: Payer: Medicare HMO | Admitting: Nurse Practitioner

## 2018-10-16 ENCOUNTER — Ambulatory Visit: Payer: Medicare HMO | Admitting: Physical Therapy

## 2018-10-17 ENCOUNTER — Ambulatory Visit: Payer: Medicare HMO | Admitting: Physical Therapy

## 2018-10-22 ENCOUNTER — Ambulatory Visit: Payer: Medicare HMO | Admitting: Physical Therapy

## 2018-10-24 ENCOUNTER — Ambulatory Visit: Payer: Medicare HMO | Admitting: Physical Therapy

## 2018-10-29 ENCOUNTER — Ambulatory Visit: Payer: Medicare HMO | Admitting: Physical Therapy

## 2018-10-31 ENCOUNTER — Ambulatory Visit: Payer: Medicare HMO | Admitting: Physical Therapy

## 2018-10-31 ENCOUNTER — Ambulatory Visit: Payer: Medicare HMO | Attending: Nurse Practitioner | Admitting: Physical Therapy

## 2018-10-31 ENCOUNTER — Encounter: Payer: Self-pay | Admitting: Physical Therapy

## 2018-10-31 ENCOUNTER — Other Ambulatory Visit: Payer: Self-pay

## 2018-10-31 DIAGNOSIS — R262 Difficulty in walking, not elsewhere classified: Secondary | ICD-10-CM | POA: Insufficient documentation

## 2018-10-31 DIAGNOSIS — M6281 Muscle weakness (generalized): Secondary | ICD-10-CM | POA: Diagnosis not present

## 2018-10-31 DIAGNOSIS — R2689 Other abnormalities of gait and mobility: Secondary | ICD-10-CM | POA: Diagnosis present

## 2018-10-31 NOTE — Therapy (Addendum)
[facility] MAIN [facility] SERVICES [address] [place], [place], [address] Phone: [phone]   Fax:  [phone]  Physical Therapy Evaluation  Patient Details  Name: Mark Wells MRN: [id number] Date of Birth: 05/19/1971 Referring Provider (PT): Cheron Every F    Encounter Date: 10/31/2018  PT End of Session - 10/31/18 0853    Visit Number  1    Number of Visits  17    Date for PT Re-Evaluation  12/26/18    Authorization Type  1/10 progress note period begins 11/10/18    PT Start Time  0831    PT Stop Time  0930    PT Time Calculation (min)  59 min    Equipment Utilized During Treatment  Gait belt    Activity Tolerance  Patient limited by fatigue   Patient falling asleep throughout eval session   Behavior During Therapy  Flat affect       Past Medical History:  Diagnosis Date  . Anemia   . Anxiety   . Chronic back pain   . Chronic pain   . Depression   . Hemiplegia affecting left nondominant side (HCC)   . Hyperlipidemia   . Hypertension   . Mood disorder (HCC)   . Personality disorder (HCC)   . Restless leg syndrome   . Sleep apnea   . Stroke [facility])     Past Surgical History:  Procedure Laterality Date  . BACK SURGERY      There were no vitals filed for this visit.   Subjective Assessment - 10/31/18 0837    Subjective  Pateint presents to therapy with complaints of frequent falls. Patient is alseep in his wc initially and falls asleep during his evaluation session today,     Pertinent History  Patient had a CVA 10/18 and went to [facility] and then had SNF, and then HHPT. The therapy ended 1/19. Patient is independent with hemiwalker for ambulating and uses the hemiwalker outdoors. Prior to the CVA he reports that he was not walking very much. He lives with his mom. Patient had back revision surgery to back 2 month ago. He had back surgery 2006.     Limitations  Standing;Walking;House hold activities    How  long can you stand comfortably?  10 mins    How long can you walk comfortably?  10 mins    Patient Stated Goals  Patient wants to stop falling.     Currently in Pain?  Yes    Pain Score  10-Worst pain ever    Pain Location  Leg    Pain Orientation  Left    Pain Descriptors / Indicators  Aching    Pain Type  Chronic pain   3 years of left leg pain   Pain Onset  More than a month ago    Pain Frequency  Constant    Aggravating Factors   cold weather     Pain Relieving Factors  no    Effect of Pain on Daily Activities  Makes is difficutl to walk and stand    Multiple Pain Sites  No         [facility] PT Assessment - 10/31/18 0847      Assessment   Medical Diagnosis  frequent falls    Referring Provider (PT)  Cheron Every F     Onset Date/Surgical Date  09/14/18    Hand Dominance  Right    Prior Therapy  1/19  Precautions   Precautions  Fall      Restrictions   Weight Bearing Restrictions  No      Balance Screen   Has the patient fallen in the past 6 months  Yes    How many times?  7    Has the patient had a decrease in activity level because of a fear of falling?   Yes    Is the patient reluctant to leave their home because of a fear of falling?   Yes      Home Environment   Living Environment  Private residence    Living Arrangements  Spouse/significant other    Available Help at Discharge  Family    Type of Home  House    Home Access  Stairs to enter    Entrance Stairs-Number of Steps  3    Entrance Stairs-Rails  Right;Left;Can reach both    Home Layout  One level    Home Equipment  Other (comment)    Additional Comments  --   hemi walker     Prior Function   Level of Independence  Independent with basic ADLs    Vocation  On disability    Leisure  --   listen to music     Cognition   Overall Cognitive Status  Difficult to assess          POSTURE: flexed posture head and trunk in sitting   PROM/AROM: AROM is limited in LUE shoulder flex and abd 10  degs   Patient has spacticity in LUE and LLE with increased tone flex and extension , slow movements  STRENGTH:  Graded on a 0-5 scale Muscle Group Left Right  Shoulder flex 0/5 4/5  Shoulder Abd 0/5 4/5  Shoulder Ext 0/5 4/5  Shoulder IR/ER 0/5 4/5  Elbow 2/5 4/5  Wrist/hand -3/5 4/5  Hip Flex 2/5 2/5  Hip Abd 1/5 2/5  Hip Add 1/5 1/5  Hip Ext NT NT      Knee Flex 3/5 3/5  Knee Ext 3/5 3/5  Ankle DF 4/5 4/5  Ankle PF 2/5 2/5   SENSATION:  Patient has numbness and tingling in LUE and LLE   FUNCTIONAL MOBILITY: slow  Mobility for transfers and gait , needs UE for transfers, poor ability for safe sequencing out of WC   BALANCE: Static Standing Balance  Normal Able to maintain standing balance against maximal resistance   Good Able to maintain standing balance against moderate resistance   Good-/Fair+ Able to maintain standing balance against minimal resistance   Fair Able to stand unsupported without UE support and without LOB for 1-2 min   Fair- Requires Min A and UE support to maintain standing without loss of balance x  Poor+ Requires mod A and UE support to maintain standing without loss of balance   Poor Requires max A and UE support to maintain standing balance without loss    Standing Dynamic Balance  Normal Stand independently unsupported, able to weight shift and cross midline maximally   Good Stand independently unsupported, able to weight shift and cross midline moderately   Good-/Fair+ Stand independently unsupported, able to weight shift across midline minimally   Fair Stand independently unsupported, weight shift, and reach ipsilaterally, loss of balance when crossing midline   Poor+ Able to stand with Min A and reach ipsilaterally, unable to weight shift   Poor Able to stand with Mod A and minimally reach ipsilaterally, unable to cross midline. x  GAIT: Patient ambulates with hemi walker short distances .16 m/sec with 2 times needing max assist to keep  from falling, RLE adduction during gait across path of LLE  OUTCOME MEASURES: TEST Outcome Interpretation  5 times sit<>stand 18 sec sec >60 yo, >15 sec indicates increased risk for falls  10 meter walk test   .16              m/s <1.0 m/s indicates increased risk for falls; limited community ambulator  Timed up and Go        41.39         sec <14 sec indicates increased risk for falls                Treatment ; Heel raises bilateral x 20  Seated hip flex x 10 Seated knee extension x 10   Pt educated throughout session about proper posture and technique with exercises. Improved exercise technique, movement at target joints, use of target muscles after min to mod verbal, visual, tactile cues.       Objective measurements completed on examination: See above findings.              PT Education - 10/31/18 [id number]    Education Details  plan of care    Person(s) Educated  Patient    Methods  Explanation    Comprehension  Verbalized understanding       PT Short Term Goals - 10/31/18 0956      PT SHORT TERM GOAL #1   Title  Patient will be independent in home exercise program to improve strength/mobility for better functional independence with ADLs.    Time  4    Period  Weeks    Status  New    Target Date  11/28/18        PT Long Term Goals - 10/31/18 0957      PT LONG TERM GOAL #1   Title  Patient (< 51 years old) will complete five times sit to stand test in < 10 seconds indicating an increased LE strength and improved balance.    Time  12    Period  Weeks    Status  New    Target Date  01/23/19      PT LONG TERM GOAL #2   Title  Patient will increase 10 meter walk test to >1.5m/s as to improve gait speed for better community ambulation and to reduce fall risk.    Time  12    Period  Weeks    Status  New    Target Date  01/23/19      PT LONG TERM GOAL #3   Title  Patient will reduce timed up and go to <11 seconds to reduce fall risk and demonstrate  improved transfer/gait ability.    Time  12    Period  Weeks    Status  New    Target Date  01/23/19             Plan - 10/31/18 0959    Clinical Impression Statement  Patient is 47 yr old male with frequent falls, PMHx of CVA 1 year ago and recent back surgery. He has deficits including decreased AROM and strength in LUE, decreased strength BLE, decreased saftey with mobility, decreased gait with hemiwalker and poor stabiity during ambulation. He has decreased stataic and dynamic standing balance . He has LLE pain 10/10 that is constant . He would benefit from skilled PT to improve  saftey, strength and mobility..    Clinical Presentation  Stable    Clinical Decision Making  Low    Rehab Potential  Fair    PT Frequency  2x / week    PT Duration  12 weeks    PT Treatment/Interventions  Aquatic Therapy;Therapeutic exercise;Therapeutic activities;Balance training    PT Home Exercise Plan  heel riases    Recommended Other Services  OT eval    Consulted and Agree with Plan of Care  Patient       Patient will benefit from skilled therapeutic intervention in order to improve the following deficits and impairments:  Abnormal gait, Decreased balance, Decreased endurance, Decreased mobility, Difficulty walking, Pain, Impaired flexibility, Decreased strength, Decreased safety awareness, Decreased coordination, Decreased activity tolerance, Impaired tone, Decreased range of motion, Decreased knowledge of precautions  Visit Diagnosis: Muscle weakness (generalized) - Plan: PT plan of care cert/re-cert  Other abnormalities of gait and mobility - Plan: PT plan of care cert/re-cert  Difficulty in walking, not elsewhere classified - Plan: PT plan of care cert/re-cert     Problem List Patient Active Problem List   Diagnosis Date Noted  . Painful orthopaedic hardware (HCC) 09/03/2018  . BACK PAIN 01/07/2011  . ADVERSE DRUG REACTION 01/01/2011  . UNSPECIFIED ESSENTIAL HYPERTENSION 12/28/2010   . LOW BACK PAIN SYNDROME, SEVERE 12/28/2010  . OTHER URINARY INCONTINENCE 12/28/2010    Ezekiel Ina, [place] DPT 11/01/2018, 10:34 AM  [facility] MAIN [facility] SERVICES [address] [place], [place], [address] Phone: [phone]   Fax:  [phone]  Name: Mark Wells MRN: [id number] Date of Birth: 06-14-1971

## 2018-11-01 ENCOUNTER — Ambulatory Visit: Payer: Medicare HMO

## 2018-11-01 NOTE — Addendum Note (Signed)
Addended by: Ezekiel Ina on: 11/01/2018 10:37 AM   Modules accepted: Orders

## 2018-11-02 ENCOUNTER — Ambulatory Visit: Payer: Medicare HMO | Admitting: Nurse Practitioner

## 2018-11-05 ENCOUNTER — Ambulatory Visit: Payer: Medicare HMO | Admitting: Physical Therapy

## 2018-11-06 ENCOUNTER — Ambulatory Visit: Payer: Medicare HMO | Admitting: Physical Therapy

## 2018-11-07 ENCOUNTER — Ambulatory Visit: Payer: Medicare HMO | Admitting: Physical Therapy

## 2018-11-08 ENCOUNTER — Ambulatory Visit: Payer: Medicare HMO

## 2018-11-13 ENCOUNTER — Ambulatory Visit: Payer: Medicare HMO | Admitting: Physical Therapy

## 2018-11-13 ENCOUNTER — Ambulatory Visit: Payer: Medicare HMO

## 2018-11-14 ENCOUNTER — Ambulatory Visit: Payer: Medicare HMO | Admitting: Physical Therapy

## 2018-11-15 ENCOUNTER — Ambulatory Visit: Payer: Medicare HMO

## 2018-11-16 ENCOUNTER — Ambulatory Visit: Payer: Medicare HMO | Admitting: Occupational Therapy

## 2018-11-19 ENCOUNTER — Encounter: Payer: Self-pay | Admitting: Emergency Medicine

## 2018-11-19 ENCOUNTER — Ambulatory Visit: Payer: Medicare HMO | Admitting: Physical Therapy

## 2018-11-19 ENCOUNTER — Emergency Department
Admission: EM | Admit: 2018-11-19 | Discharge: 2018-11-19 | Disposition: A | Discharge status: Home / Self Care | Payer: Medicare HMO | Attending: Emergency Medicine | Admitting: Emergency Medicine

## 2018-11-19 ENCOUNTER — Other Ambulatory Visit: Payer: Self-pay

## 2018-11-19 DIAGNOSIS — M5442 Lumbago with sciatica, left side: Secondary | ICD-10-CM | POA: Diagnosis not present

## 2018-11-19 DIAGNOSIS — Z79899 Other long term (current) drug therapy: Secondary | ICD-10-CM | POA: Insufficient documentation

## 2018-11-19 DIAGNOSIS — G8929 Other chronic pain: Secondary | ICD-10-CM | POA: Insufficient documentation

## 2018-11-19 DIAGNOSIS — I10 Essential (primary) hypertension: Secondary | ICD-10-CM | POA: Diagnosis not present

## 2018-11-19 DIAGNOSIS — M545 Low back pain: Secondary | ICD-10-CM | POA: Diagnosis present

## 2018-11-19 DIAGNOSIS — F17210 Nicotine dependence, cigarettes, uncomplicated: Secondary | ICD-10-CM | POA: Diagnosis not present

## 2018-11-19 MED ORDER — DEXAMETHASONE SODIUM PHOSPHATE 10 MG/ML IJ SOLN
10.0000 mg | Freq: Once | INTRAMUSCULAR | Status: AC
  Administered 2018-11-19: 10 mg via INTRAMUSCULAR
  Filled 2018-11-19: qty 1

## 2018-11-19 MED ORDER — PREDNISONE 50 MG PO TABS: 50.0000 mg | ORAL_TABLET | Freq: Every day | ORAL | 0 refills | Status: DC

## 2018-11-19 MED ORDER — MORPHINE SULFATE (PF) 4 MG/ML IV SOLN
4.0000 mg | Freq: Once | INTRAVENOUS | Status: AC
  Administered 2018-11-19: 4 mg via INTRAMUSCULAR
  Filled 2018-11-19: qty 1

## 2018-11-19 MED ORDER — METHOCARBAMOL 500 MG PO TABS: 500.0000 mg | ORAL_TABLET | Freq: Four times a day (QID) | ORAL | 0 refills | Status: DC

## 2018-11-19 MED ORDER — ORPHENADRINE CITRATE 30 MG/ML IJ SOLN
60.0000 mg | Freq: Once | INTRAMUSCULAR | Status: AC
  Administered 2018-11-19: 60 mg via INTRAMUSCULAR
  Filled 2018-11-19: qty 2

## 2018-11-19 NOTE — ED Provider Notes (Signed)
[facility] Emergency Department Provider Note  ____________________________________________  Time seen: Approximately 7:59 PM  I have reviewed the triage vital signs and the nursing notes.   HISTORY  Chief Complaint Back Pain    HPI Mark Wells is a 47 y.o. male who presents emergency department complaining of increased lower back pain and left-sided radiculopathy.  Patient has chronic lower back pain, has had fusion as well as hardware removal surgeries.  Patient had an epidural steroid injection today, after injection, patient's pain increased.  Patient has had no bowel or bladder dysfunction, saddle anesthesia or paresthesias.  Patient is requesting medications to help with this pain.  Patient does have neurosurgery and sees them on a regular basis.  He is also on chronic pain medication to include Lyrica and oxycodone.  Patient denies any trauma to the area.  No urinary symptoms.  No GI symptoms.  No other complaints at this time other than increasing chronic pain.    Past Medical History:  Diagnosis Date  . Anemia   . Anxiety   . Chronic back pain   . Chronic pain   . Depression   . Hemiplegia affecting left nondominant side (HCC)   . Hyperlipidemia   . Hypertension   . Mood disorder (HCC)   . Personality disorder (HCC)   . Restless leg syndrome   . Sleep apnea   . Stroke [facility](HCC)     Patient Active Problem List   Diagnosis Date Noted  . Painful orthopaedic hardware (HCC) 09/03/2018  . BACK PAIN 01/07/2011  . ADVERSE DRUG REACTION 01/01/2011  . UNSPECIFIED ESSENTIAL HYPERTENSION 12/28/2010  . LOW BACK PAIN SYNDROME, SEVERE 12/28/2010  . OTHER URINARY INCONTINENCE 12/28/2010    Past Surgical History:  Procedure Laterality Date  . BACK SURGERY      Prior to Admission medications   Medication Sig Start Date End Date Taking? Authorizing Provider  amLODipine (NORVASC) 5 MG tablet Take 5 mg by mouth daily. 07/20/18   [provider]   atorvastatin (LIPITOR) 40 MG tablet Take 40 mg by mouth every evening.    [provider]  baclofen (LIORESAL) 20 MG tablet Take 20 mg by mouth 4 (four) times daily. 08/20/18   [provider]  chlorthalidone (HYGROTON) 25 MG tablet Take 25 mg by mouth daily. 07/20/18   [provider]  clopidogrel (PLAVIX) 75 MG tablet Take 75 mg by mouth daily.    [provider]  HYDROmorphone (DILAUDID) 2 MG tablet Take 1 tablet (2 mg total) by mouth every 4 (four) hours as needed for severe pain. 09/04/18 09/04/19  Julio SicksPool, Henry, MD  losartan (COZAAR) 100 MG tablet Take 100 mg by mouth daily. 07/20/18   [provider]  methocarbamol (ROBAXIN) 500 MG tablet Take 1 tablet (500 mg total) by mouth 4 (four) times daily. 11/19/18   Furman Trentman, Delorise RoyalsJonathan D, PA-C  predniSONE (DELTASONE) 50 MG tablet Take 1 tablet (50 mg total) by mouth daily with breakfast. 11/19/18   Tahlor Berenguer, Delorise RoyalsJonathan D, PA-C  pregabalin (LYRICA) 300 MG capsule Take 300 mg by mouth 2 (two) times daily.     [provider]    Allergies Aspirin; Ibuprofen; Iodine; and Shellfish allergy  No family history on file.  Social History Social History   Tobacco Use  . Smoking status: Current Every Day Smoker    Packs/day: 0.50    Types: Cigarettes  . Smokeless tobacco: Never Used  Substance Use Topics  . Alcohol use: No  . Drug  use: No     Review of Systems  Constitutional: No fever/chills Eyes: No visual changes.  Cardiovascular: no chest pain. Respiratory: no cough. No SOB. Gastrointestinal: No abdominal pain.  No nausea, no vomiting.  No diarrhea.  No constipation. Genitourinary: Negative for dysuria. No hematuria Musculoskeletal: Increasing chronic back pain with radicular symptoms down the left leg. Skin: Negative for rash, abrasions, lacerations, ecchymosis. Neurological: Negative for headaches, focal weakness or numbness. 10-point ROS otherwise  negative.  ____________________________________________   PHYSICAL EXAM:  VITAL SIGNS: ED Triage Vitals  Enc Vitals Group     BP 11/19/18 1859 112/75     Pulse Rate 11/19/18 1859 96     Resp 11/19/18 1859 20     Temp 11/19/18 1859 98.7 F (37.1 C)     Temp Source 11/19/18 1859 Oral     SpO2 11/19/18 1859 98 %     Weight 11/19/18 1900 200 lb (90.7 kg)     Height 11/19/18 1900 5\' 6"  (1.676 m)     Head Circumference --      Peak Flow --      Pain Score 11/19/18 1900 10     Pain Loc --      Pain Edu? --      Excl. in GC? --      Constitutional: Alert and oriented. Well appearing and in no acute distress. Eyes: Conjunctivae are normal. PERRL. EOMI. Head: Atraumatic. Neck: No stridor.    Cardiovascular: Normal rate, regular rhythm. Normal S1 and S2.  Good peripheral circulation. Respiratory: Normal respiratory effort without tachypnea or retractions. Lungs CTAB. Good air entry to the bases with no decreased or absent breath sounds. Gastrointestinal: Bowel sounds 4 quadrants. Soft and nontender to palpation. No guarding or rigidity. No palpable masses. No distention. No CVA tenderness. Musculoskeletal: Full range of motion to all extremities. No gross deformities appreciated.  Previous surgical incisions are appreciated.  No edema, erythema, abrasions or lacerations.  No visible signs of trauma.  Patient is diffusely tender to palpation throughout the lumbar spine.  Tender to palpation over bilateral sciatic notch, worse on the left than right.  Positive straight leg raise bilaterally.  Dorsalis pedis pulse intact bilateral lower extremities.  Sensation intact and equal bilateral lower extremities. Neurologic:  Normal speech and language. No gross focal neurologic deficits are appreciated.  Skin:  Skin is warm, dry and intact. No rash noted. Psychiatric: Mood and affect are normal. Speech and behavior are normal. Patient exhibits appropriate insight and  judgement.   ____________________________________________   LABS (all labs ordered are listed, but only abnormal results are displayed)  Labs Reviewed - No data to display ____________________________________________  EKG   ____________________________________________  RADIOLOGY   No results found.  ____________________________________________    PROCEDURES  Procedure(s) performed:    Procedures    Medications  dexamethasone (DECADRON) injection 10 mg (has no administration in time range)  orphenadrine (NORFLEX) injection 60 mg (has no administration in time range)  morphine 4 MG/ML injection 4 mg (has no administration in time range)     ____________________________________________   INITIAL IMPRESSION / ASSESSMENT AND PLAN / ED COURSE  Pertinent labs & imaging results that were available during my care of the patient were reviewed by me and considered in my medical decision making (see chart for details).  Review of the [place] CSRS was performed in accordance of the [facility] prior to dispensing any controlled drugs.      Patient's diagnosis is consistent with acute on chronic  lower back pain.  Patient has long history of lower back pain with sciatica.  Patient has had fusion as well as removal of hardware to his spine.  Patient received epidural injection today, had an increase in his pain.  No concerning symptoms of bowel or bladder dysfunction, saddle anesthesia or paresthesias.  Patient is here for pain management.  No indication at this time for imaging or labs.  Patient will be provided injection for steroid, Norflex, narcotic in the emergency department.  Follow-up with neurosurgery.  Patient will be discharged with short course of steroid and muscle relaxer..  Patient is given ED precautions to return to the ED for any worsening or new symptoms.     ____________________________________________  FINAL CLINICAL IMPRESSION(S) / ED DIAGNOSES  Final diagnoses:   Chronic midline low back pain with left-sided sciatica      NEW MEDICATIONS STARTED DURING THIS VISIT:  ED Discharge Orders         Ordered    predniSONE (DELTASONE) 50 MG tablet  Daily with breakfast     11/19/18 2019    methocarbamol (ROBAXIN) 500 MG tablet  4 times daily     11/19/18 2019              This chart was dictated using voice recognition software/Dragon. Despite best efforts to proofread, errors can occur which can change the meaning. Any change was purely unintentional.    Racheal Patches, PA-C 11/19/18 2020    Jene Every, MD 11/19/18 2020

## 2018-11-19 NOTE — ED Triage Notes (Signed)
Pt in via [facility] from home with complaints lower back pain w/ radiation down into left leg.  Pt reports getting "steriod shot" monthly, last one today, reports pain has worsened since the shot.  Vitals WDL, NAD noted at this time.

## 2018-11-19 NOTE — ED Notes (Signed)
Pt c/o lower back pain that radiates down his left leg. States that it causes him to fall due to the pain and weakness that he feels.

## 2018-11-20 ENCOUNTER — Ambulatory Visit: Payer: Medicare HMO

## 2018-11-21 ENCOUNTER — Ambulatory Visit: Payer: Medicare HMO | Admitting: Occupational Therapy

## 2018-11-21 ENCOUNTER — Ambulatory Visit: Payer: Medicare HMO | Admitting: Physical Therapy

## 2018-11-26 ENCOUNTER — Ambulatory Visit: Payer: Medicare HMO | Admitting: Physical Therapy

## 2018-11-27 ENCOUNTER — Ambulatory Visit: Payer: Medicare HMO

## 2018-11-28 ENCOUNTER — Ambulatory Visit: Payer: Medicare HMO | Admitting: Physical Therapy

## 2018-11-29 ENCOUNTER — Encounter: Payer: Medicare HMO | Admitting: Occupational Therapy

## 2018-12-04 ENCOUNTER — Ambulatory Visit: Payer: Medicare HMO

## 2018-12-06 ENCOUNTER — Encounter: Payer: Medicare HMO | Admitting: Occupational Therapy

## 2018-12-07 ENCOUNTER — Ambulatory Visit (INDEPENDENT_AMBULATORY_CARE_PROVIDER_SITE_OTHER): Payer: Medicare HMO | Admitting: Family Medicine

## 2018-12-07 ENCOUNTER — Encounter: Payer: Self-pay | Admitting: Family Medicine

## 2018-12-07 ENCOUNTER — Other Ambulatory Visit: Payer: Self-pay | Admitting: Family Medicine

## 2018-12-07 VITALS — BP 120/78 | HR 77 | Resp 15 | Ht 66.0 in | Wt 196.0 lb

## 2018-12-07 DIAGNOSIS — I10 Essential (primary) hypertension: Secondary | ICD-10-CM | POA: Diagnosis not present

## 2018-12-07 DIAGNOSIS — I69352 Hemiplegia and hemiparesis following cerebral infarction affecting left dominant side: Secondary | ICD-10-CM

## 2018-12-07 DIAGNOSIS — I6930 Unspecified sequelae of cerebral infarction: Secondary | ICD-10-CM | POA: Diagnosis not present

## 2018-12-07 DIAGNOSIS — E782 Mixed hyperlipidemia: Secondary | ICD-10-CM

## 2018-12-07 DIAGNOSIS — F339 Major depressive disorder, recurrent, unspecified: Secondary | ICD-10-CM

## 2018-12-07 DIAGNOSIS — G4733 Obstructive sleep apnea (adult) (pediatric): Secondary | ICD-10-CM

## 2018-12-07 DIAGNOSIS — G894 Chronic pain syndrome: Secondary | ICD-10-CM

## 2018-12-07 DIAGNOSIS — Z9989 Dependence on other enabling machines and devices: Secondary | ICD-10-CM

## 2018-12-07 DIAGNOSIS — G4701 Insomnia due to medical condition: Secondary | ICD-10-CM

## 2018-12-07 MED ORDER — METHOCARBAMOL 500 MG PO TABS: 500.0000 mg | ORAL_TABLET | Freq: Four times a day (QID) | ORAL | 2 refills | Status: DC | PRN

## 2018-12-07 MED ORDER — PREDNISONE 20 MG PO TABS: ORAL_TABLET | ORAL | 0 refills | Status: DC

## 2018-12-07 MED ORDER — CLOPIDOGREL BISULFATE 75 MG PO TABS: 75.0000 mg | ORAL_TABLET | Freq: Every day | ORAL | 1 refills | Status: DC

## 2018-12-07 MED ORDER — ATORVASTATIN CALCIUM 40 MG PO TABS: 40.0000 mg | ORAL_TABLET | Freq: Every evening | ORAL | 1 refills | Status: DC

## 2018-12-07 NOTE — Patient Instructions (Addendum)
Thank you for coming to the office today.  For mental health - psychiatry, insomnia and anxiety - discuss medications.  [facility]) [place] [address], [place], [place] [address] Phone: [phone]  [facility], available walk-in 9am-4pm M-F [address] [place], [place] [address] Hours: 9am - 4pm (M-F, walk in available) [phone]  ------------------------------------------------------------------------  May call them if needed if [facility] and [facility] do not work out well.  [facility] [address], [address] [place], [id number] Phone: [phone] (Option 1) [email or web address]  ------------------------------------------------------------------  Try prednisone taper now for pain. - We cannot do prednisone more than once a month - and eventually need to space it out more  Follow-up with Dr Wayland SalinasFojtik at [facility] - ask about hand or upper extremity injections.  Return to physical therapy - for evaluation - in future if determined you meet criteria for Skilled Nursing Facility (SNF) - such as [facility] in [place] or other locations nearby - then PT usually will contact us and you can arrange to have a Child psychotherapistocial Worker help you get in - may need to call the facility and check availability.  ---------------------------------- Take Plavix 75mg  daily   Please schedule a Follow-up Appointment to: Return in about 3 months (around 03/08/2019) for 3 month follow-up L sided weak/CVA, pain, psychiatry follow-up.  If you have any other questions or concerns, please feel free to call the office or send a message through MyChart. You may also schedule an earlier appointment if necessary.  Additionally, you may be receiving a survey about your experience at our office within a few days to 1 week by e-mail or mail. We value your feedback.  Mark PilarAlexander Kiyan Burmester, DO [facility]

## 2018-12-07 NOTE — Progress Notes (Signed)
Subjective:    Patient ID: Mark Wells, male    DOB: 03/17/1971, 47 y.o.   MRN: [id number]  Mark Wells is a 47 y.o. male presenting on 12/07/2018 for Establish Care; Hand Pain (left ); and Hemiplegia, L sided secondary to stroke (history of prior CVA)  Previous PCP Mark Wells, [place] [facility]. He has family in [place], and significant other's family more local here. He moved to live closer to family here.  HPI   CHRONIC HTN: Reports no new concerns. BP has been controlled Current Meds - Amlodipine 5mg  daily, Chlorthalidone 25mg  daily, Losartan 100mg  daily   Reports good compliance, took meds today. Tolerating well, w/o complaints. Denies CP, dyspnea, HA, edema, dizziness / lightheadedness  Chronic Pain Syndrome / Osteoarthritis Multiple Joints / Lumbar Spine DJD disc herniation L5 History of disc herniation 2006, he had up to 5 surgeries in 2 years, in [place] - Recent surgical history, he had a revision of back hardware by Mark Wells - 08/2018 - He follows up with [facility] Mark Wells, monthly for injection therapy for pain - He took Prednisone previously -request a taper pack of prednisone Als0 Followed by [facility], Pain Management - Mark Wells, last rx Oxycodone-Acetaminophen 7.5/325mg  #90 on 11/29/18, and Lyrica 300mg  #60 on 11/06/18  Left sided Upper and Lower Extremity Hemiplegia / History of CVA w/ residual deficit - History of CVA in October 2018, he was in wheelchair afterward due to limited Left side upper and lower extremity hemiplegia. He works with Physical Therapy regularly at [facility]. He has intact sensation, but gradually improving motor strength. He uses walker now for mobility. Asking about possibility of going back to [place] at [facility], he has family nearby.  OSA on CPAP  Hyperlipidemia On Atorvastatin daily.  Depression / Insomnia Prior history of mood disorder. He has not seen  Psychiatry. Previously on Ambien and Klonopin, no longer on. Asking for these rx.  Requested outside records from prior PCP  No flowsheet data found.    Past Medical History:  Diagnosis Date  . Anemia   . Anxiety   . Chronic back pain   . Depression   . Hemiplegia affecting left nondominant side (HCC)   . Hyperlipidemia   . Mood disorder (HCC)   . Personality disorder (HCC)   . Restless leg syndrome   . Sleep apnea    Past Surgical History:  Procedure Laterality Date  . BACK SURGERY     Social History   Socioeconomic History  . Marital status: Single    Spouse name: Not on file  . Number of children: Not on file  . Years of education: Not on file  . Highest education level: Not on file  Occupational History  . Not on file  Social Needs  . Financial resource strain: Not on file  . Food insecurity:    Worry: Not on file    Inability: Not on file  . Transportation needs:    Medical: Not on file    Non-medical: Not on file  Tobacco Use  . Smoking status: Current Every Day Smoker    Packs/day: 0.50    Types: Cigarettes  . Smokeless tobacco: Never Used  Substance and Sexual Activity  . Alcohol use: No  . Drug use: No  . Sexual activity: Yes    Birth control/protection: None  Lifestyle  . Physical activity:    Days per week: Not on file    Minutes  per session: Not on file  . Stress: Not on file  Relationships  . Social connections:    Talks on phone: Not on file    Gets together: Not on file    Attends religious service: Not on file    Active member of club or organization: Not on file    Attends meetings of clubs or organizations: Not on file    Relationship status: Not on file  . Intimate partner violence:    Fear of current or ex partner: Not on file    Emotionally abused: Not on file    Physically abused: Not on file    Forced sexual activity: Not on file  Other Topics Concern  . Not on file  Social History Narrative  . Not on file   History  reviewed. No pertinent family history. Current Outpatient Medications on File Prior to Visit  Medication Sig  . amLODipine (NORVASC) 5 MG tablet Take 5 mg by mouth daily.  . chlorthalidone (HYGROTON) 25 MG tablet Take 25 mg by mouth daily.  . pregabalin (LYRICA) 300 MG capsule Take 300 mg by mouth 2 (two) times daily.   [name]. losartan (COZAAR) 100 MG tablet Take 100 mg by mouth daily.   No current facility-administered medications on file prior to visit.     Review of Systems Per HPI unless specifically indicated above     Objective:    BP 120/78 (BP Location: Right Arm, Patient Position: Sitting, Cuff Size: Normal)   Pulse 77   Resp 15   Ht 5\' 6"  (1.676 m)   Wt 196 lb (88.9 kg)   SpO2 97%   BMI 31.64 kg/m   Wt Readings from Last 3 Encounters:  12/07/18 196 lb (88.9 kg)  11/19/18 200 lb (90.7 kg)  09/13/18 188 lb 0.8 oz (85.3 kg)    Physical Exam Vitals signs and nursing note reviewed.  Constitutional:      General: He is not in acute distress.    Appearance: He is well-developed. He is not diaphoretic.     Comments: Chronically ill-appearing, uncomfortable L sided upper extremity pain, cooperative, used wheelchair and then ambulated with walker to get to exam room  HENT:     Head: Normocephalic and atraumatic.  Eyes:     General:        Right eye: No discharge.        Left eye: No discharge.     Conjunctiva/sclera: Conjunctivae normal.  Neck:     Musculoskeletal: Normal range of motion and neck supple.     Thyroid: No thyromegaly.  Cardiovascular:     Rate and Rhythm: Normal rate and regular rhythm.     Heart sounds: Normal heart sounds. No murmur.  Pulmonary:     Effort: Pulmonary effort is normal. No respiratory distress.     Breath sounds: Normal breath sounds. No wheezing or rales.  Musculoskeletal:     Comments: Upper Extremity Inspection: Left upper ext is held at his side, with some spastic symptoms of shoulder and upper ext elbow ROM: limited abduction  forward flex and internal rotation, has some mild range forward flex only, grip motion and elbow is reduced Strength: grip 3/5, forward flexion biceps strength 4/5, abduction triceps unable to test Neurovascular: distal intact on R, reduced on L  Lower Extremity Inspection: symmetrical Palpation: non tender ROM: R lower ext full ROM. L lower ext good ROM knee/hip flex ext, ankle dorsiplantar flex - only slightly reduced on Left Strength: strength mildly  reduced to 4/5 on Left side, compared to R Neurovascular: sensation intact grossly distal  Lymphadenopathy:     Cervical: No cervical adenopathy.  Skin:    General: Skin is warm and dry.     Findings: No erythema or rash.  Neurological:     Mental Status: He is alert and oriented to person, place, and time.  Psychiatric:        Behavior: Behavior normal.     Comments: Well groomed, good eye contact, normal speech and thoughts    Results for orders placed or performed during the hospital encounter of 09/13/18  Comprehensive metabolic panel  Result Value Ref Range   Sodium 141 135 - 145 mmol/L   Potassium 3.4 (L) 3.5 - 5.1 mmol/L   Chloride 104 98 - 111 mmol/L   CO2 27 22 - 32 mmol/L   Glucose, Bld 88 70 - 99 mg/dL   BUN 9 6 - 20 mg/dL   Creatinine, Ser 1.61 0.61 - 1.24 mg/dL   Calcium 9.1 8.9 - 09.6 mg/dL   Total Protein 6.7 6.5 - 8.1 g/dL   Albumin 3.9 3.5 - 5.0 g/dL   AST 18 15 - 41 U/L   ALT 17 0 - 44 U/L   Alkaline Phosphatase 55 38 - 126 U/L   Total Bilirubin 0.7 0.3 - 1.2 mg/dL   GFR calc non Af Amer >60 >60 mL/min   GFR calc Af Amer >60 >60 mL/min   Anion gap 10 5 - 15      Assessment & Plan:   Problem List Items Addressed This Visit    Chronic pain syndrome Secondary to Lumbar Spine OA/DJD, and chronic residual MSK hemiplegia from stroke limiting mobility. Followed by [facility] for spine/pain. Also seen at [facility] for pain management  Agree to rx Prednisone taper 1 week, explained risks of long  term prednisone use and advised him against repeat future courses Continue Lyrica from pain management    Essential hypertension Controlled HTN  Plan Continue current meds    Relevant Medications   atorvastatin (LIPITOR) 40 MG tablet   Insomnia   Major depression, recurrent, chronic (HCC) Chronic major depression, incompletely treated now not on any anti depressant Not followed by Psych Secondary insomnia Complicated by prior CVA and Pain  Plan Handout given for locations of [facility] and [facility], also [facility] - he should attempt to establish with one for med management in future given his complex history. Advised him that I would not plan to rx BDZ Klonopin and Ambien today. High risk meds combined with his pain management     OSA on CPAP   Spastic hemiplegia of left dominant side as late effect of cerebral infarction [facility]) - Primary Chronic residual deficits affecting his function / contributing to pain Continues ambulatory PT at [facility] Continue pain management Uses walker and wheelchair  Future may warrant SNF or other rehab program Advised him that I would not plan to refer him to SNF in [place], if he plans to return to [place], I would likely not be able to be his PCP he would need to re-establish there if chose to live with family out of state. - He may consider HH PT vs CSW - may warrant local SNF if intersted in future    Relevant Medications   predniSONE (DELTASONE) 20 MG tablet   methocarbamol (ROBAXIN) 500 MG tablet    Other Visit Diagnoses    History of cerebrovascular accident (CVA) with residual deficit  Relevant Medications   clopidogrel (PLAVIX) 75 MG tablet   atorvastatin (LIPITOR) 40 MG tablet   Mixed hyperlipidemia       Relevant Medications   atorvastatin (LIPITOR) 40 MG tablet      Meds ordered this encounter  Medications  . predniSONE (DELTASONE) 20 MG tablet    Sig: Take daily with food. Start with 60mg  (3 pills) x 2 days, then reduce to  40mg  (2 pills) x 2 days, then 20mg  (1 pill) x 3 days    Dispense:  13 tablet    Refill:  0  . clopidogrel (PLAVIX) 75 MG tablet    Sig: Take 1 tablet (75 mg total) by mouth daily.    Dispense:  90 tablet    Refill:  1  . methocarbamol (ROBAXIN) 500 MG tablet    Sig: Take 1 tablet (500 mg total) by mouth every 6 (six) hours as needed for muscle spasms.    Dispense:  120 tablet    Refill:  2  . atorvastatin (LIPITOR) 40 MG tablet    Sig: Take 1 tablet (40 mg total) by mouth every evening.    Dispense:  90 tablet    Refill:  1    Follow up plan: Return in about 3 months (around 03/08/2019) for 3 month follow-up L sided weak/CVA, pain, psychiatry follow-up.  Saralyn Pilar, DO [facility] 12/07/2018, 2:05 PM

## 2018-12-09 ENCOUNTER — Encounter: Payer: Self-pay | Admitting: Family Medicine

## 2018-12-09 DIAGNOSIS — G4733 Obstructive sleep apnea (adult) (pediatric): Secondary | ICD-10-CM | POA: Insufficient documentation

## 2018-12-09 DIAGNOSIS — Z9989 Dependence on other enabling machines and devices: Secondary | ICD-10-CM

## 2018-12-09 DIAGNOSIS — G4700 Insomnia, unspecified: Secondary | ICD-10-CM | POA: Insufficient documentation

## 2018-12-09 DIAGNOSIS — F3342 Major depressive disorder, recurrent, in full remission: Secondary | ICD-10-CM | POA: Insufficient documentation

## 2018-12-09 DIAGNOSIS — F339 Major depressive disorder, recurrent, unspecified: Secondary | ICD-10-CM | POA: Insufficient documentation

## 2018-12-09 DIAGNOSIS — G894 Chronic pain syndrome: Secondary | ICD-10-CM | POA: Insufficient documentation

## 2018-12-11 ENCOUNTER — Ambulatory Visit: Payer: Medicare HMO

## 2018-12-13 ENCOUNTER — Encounter: Payer: Medicare HMO | Admitting: Occupational Therapy

## 2018-12-13 ENCOUNTER — Ambulatory Visit: Payer: Medicare HMO | Admitting: Physical Therapy

## 2018-12-18 ENCOUNTER — Emergency Department
Admission: EM | Admit: 2018-12-18 | Discharge: 2018-12-19 | Disposition: A | Discharge status: Home / Self Care | Payer: Medicare HMO | Attending: Emergency Medicine | Admitting: Emergency Medicine

## 2018-12-18 ENCOUNTER — Other Ambulatory Visit: Payer: Self-pay

## 2018-12-18 ENCOUNTER — Emergency Department: Payer: Medicare HMO

## 2018-12-18 DIAGNOSIS — R42 Dizziness and giddiness: Secondary | ICD-10-CM | POA: Diagnosis not present

## 2018-12-18 DIAGNOSIS — Z79899 Other long term (current) drug therapy: Secondary | ICD-10-CM | POA: Insufficient documentation

## 2018-12-18 DIAGNOSIS — M25551 Pain in right hip: Secondary | ICD-10-CM | POA: Diagnosis not present

## 2018-12-18 DIAGNOSIS — W19XXXA Unspecified fall, initial encounter: Secondary | ICD-10-CM | POA: Insufficient documentation

## 2018-12-18 DIAGNOSIS — F17210 Nicotine dependence, cigarettes, uncomplicated: Secondary | ICD-10-CM | POA: Insufficient documentation

## 2018-12-18 DIAGNOSIS — I10 Essential (primary) hypertension: Secondary | ICD-10-CM | POA: Diagnosis not present

## 2018-12-18 DIAGNOSIS — Z7902 Long term (current) use of antithrombotics/antiplatelets: Secondary | ICD-10-CM | POA: Diagnosis not present

## 2018-12-18 DIAGNOSIS — Y999 Unspecified external cause status: Secondary | ICD-10-CM | POA: Insufficient documentation

## 2018-12-18 DIAGNOSIS — Y939 Activity, unspecified: Secondary | ICD-10-CM | POA: Insufficient documentation

## 2018-12-18 DIAGNOSIS — Y929 Unspecified place or not applicable: Secondary | ICD-10-CM | POA: Diagnosis not present

## 2018-12-18 DIAGNOSIS — G894 Chronic pain syndrome: Secondary | ICD-10-CM | POA: Diagnosis not present

## 2018-12-18 NOTE — ED Notes (Signed)
Patient has hx of back surgeries, states hx of legs giving out which happened tonight.

## 2018-12-18 NOTE — ED Provider Notes (Signed)
[facility] Emergency Department Provider Note  ____________________________________________   First MD Initiated Contact with Patient 12/18/18 2303     (approximate)  I have reviewed the triage vital signs and the nursing notes.   HISTORY  Chief Complaint Hip Pain   Level 5 caveat: History is somewhat limited by the patient being a vague historian.   HPI Mark Wells is a [age] y.o. male with an extensive history as listed below that includes some baseline hemiplegia and extensive chronic pain syndromes as well as personality disorder, depression, and anxiety.  He presents tonight for evaluation after a fall from standing.  When I came to check on him he was asleep and relatively slow to wake up.  He cannot provide me with many details.  I asked him what brought him into the emergency department tonight and he said "everything".  When I asked him if his right hip is hurting, as was mentioned in the triage note, he said "yes".  He also complains of issues with his left eye.  He says that his right hip hurts but cannot describe the quality of the pain nor any contributing factors that make it better or worse.  It is unclear whether it was hurting and that led to the fall or vice versa.  I went back in and talk with him some more he does not actually have any complaint with his eye.  His complaint is more that sometimes he feels dizzy when he stands up.  It seems to be positional but is been worse over the last week.  He denies chest pain, shortness of breath, nausea, vomiting, and abdominal pain.  He denies any urinary incontinence or urinary retention.  He endorses chronic back pain which does not seem to be worse than baseline.  Past Medical History:  Diagnosis Date  . Anemia   . Anxiety   . Chronic back pain   . Depression   . Hemiplegia affecting left nondominant side (HCC)   . Hyperlipidemia   . Mood disorder (HCC)   . Personality disorder (HCC)   .  Restless leg syndrome   . Sleep apnea     Patient Active Problem List   Diagnosis Date Noted  . OSA on CPAP 12/09/2018  . Chronic pain syndrome 12/09/2018  . Major depression, recurrent, chronic (HCC) 12/09/2018  . Insomnia 12/09/2018  . Spastic hemiplegia of left dominant side as late effect of cerebral infarction (HCC) 12/07/2018  . Painful orthopaedic hardware (HCC) 09/03/2018  . BACK PAIN 01/07/2011  . ADVERSE DRUG REACTION 01/01/2011  . Essential hypertension 12/28/2010  . LOW BACK PAIN SYNDROME, SEVERE 12/28/2010  . OTHER URINARY INCONTINENCE 12/28/2010    Past Surgical History:  Procedure Laterality Date  . BACK SURGERY      Prior to Admission medications   Medication Sig Start Date End Date Taking? Authorizing Provider  amLODipine (NORVASC) 5 MG tablet Take 5 mg by mouth daily. 07/20/18   [provider]  atorvastatin (LIPITOR) 40 MG tablet Take 1 tablet (40 mg total) by mouth every evening. 12/07/18   Karamalegos, Netta NeatAlexander J, DO  chlorthalidone (HYGROTON) 25 MG tablet Take 25 mg by mouth daily. 07/20/18   [provider]  clopidogrel (PLAVIX) 75 MG tablet Take 1 tablet (75 mg total) by mouth daily. 12/07/18   Karamalegos, Netta NeatAlexander J, DO  losartan (COZAAR) 100 MG tablet Take 100 mg by mouth daily. 07/20/18   [provider]  meclizine (ANTIVERT) 25 MG tablet Take  1 tablet (25 mg total) by mouth 3 (three) times daily as needed for dizziness. 12/19/18   Loleta RoseForbach, Madissen Wyse, MD  methocarbamol (ROBAXIN) 500 MG tablet Take 1 tablet (500 mg total) by mouth every 6 (six) hours as needed for muscle spasms. 12/07/18   Karamalegos, Netta NeatAlexander J, DO  predniSONE (DELTASONE) 20 MG tablet Take daily with food. Start with 60mg  (3 pills) x 2 days, then reduce to 40mg  (2 pills) x 2 days, then 20mg  (1 pill) x 3 days 12/07/18   Smitty CordsKaramalegos, Alexander J, DO  pregabalin (LYRICA) 300 MG capsule Take 300 mg by mouth 2 (two) times daily.     [provider]     Allergies Aspirin; Ibuprofen; Iodine; and Shellfish allergy  History reviewed. No pertinent family history.  Social History Social History   Tobacco Use  . Smoking status: Current Every Day Smoker    Packs/day: 0.50    Types: Cigarettes  . Smokeless tobacco: Never Used  Substance Use Topics  . Alcohol use: No  . Drug use: No    Review of Systems Constitutional: No fever/chills.  Reports that he feels bad everywhere. Eyes: No visual changes. ENT: No sore throat. Cardiovascular: Denies chest pain. Respiratory: Denies shortness of breath. Gastrointestinal: No abdominal pain.  No nausea, no vomiting.  No diarrhea.  No constipation. Genitourinary: Negative for dysuria. Musculoskeletal: Chronic back pain.  No pain in his head or neck.  Reports pain in his right hip. Integumentary: Negative for rash. Neurological: Baseline left-sided hemiplegia.  Negative for headaches, acute focal weakness or numbness.   ____________________________________________   PHYSICAL EXAM:  VITAL SIGNS: ED Triage Vitals [12/18/18 2231]  Enc Vitals Group     BP (!) 132/97     Pulse Rate 83     Resp 16     Temp 98.2 F (36.8 C)     Temp Source Oral     SpO2 98 %     Weight 90.7 kg (200 lb)     Height 1.676 m (5\' 6" )     Head Circumference      Peak Flow      Pain Score 10     Pain Loc      Pain Edu?      Excl. in GC?     Constitutional: Asleep when I checked on him, awakens slowly and speaks with slightly slurred speech.  Does not appear to be in acute distress. Eyes: Conjunctivae are normal.  Pupils are sluggish but reactive equally bilaterally. Head: Atraumatic. Nose: No congestion/rhinnorhea. Mouth/Throat: Mucous membranes are moist. Neck: No stridor.  No meningeal signs.  No cervical spine tenderness to palpation. Cardiovascular: Normal rate, regular rhythm. Good peripheral circulation. Grossly normal heart sounds. Respiratory: Normal respiratory effort.  No retractions. Lungs  CTAB. Gastrointestinal: Soft and nontender. No distention.  Musculoskeletal: The patient reports pain in his right hip but I was able to passively flex and extend his hip and engage it with internal and external rotation and abduction and no tenderness was elicited.  There are no gross deformities of the right lower extremity. Neurologic: Slow and somewhat slurred speech.  Appropriate language but vague historian.  Baseline left-sided hemiplegia but the patient is moving all 4 extremities. Skin:  Skin is warm, dry and intact. No rash noted. Psychiatric: Mood and affect are normal. Speech and behavior are normal.  ____________________________________________   LABS (all labs ordered are listed, but only abnormal results are displayed)  Labs Reviewed - No data to display ____________________________________________  EKG  No indication for EKG ____________________________________________  RADIOLOGY I, Loleta Rose, personally viewed and evaluated these images (plain radiographs) as part of my medical decision making, as well as reviewing the written report by the radiologist.  ED MD interpretation: No acute fracture nor dislocation and the right hip and pelvis.  CT scan does not show any acute findings although does show evidence of remote infarctions in the right basal ganglia which are new since 2014.  Official radiology report(s): Ct Head Wo Contrast  Result Date: 12/18/2018 CLINICAL DATA:  Left frontal injury after fall. EXAM: CT HEAD WITHOUT CONTRAST TECHNIQUE: Contiguous axial images were obtained from the base of the skull through the vertex without intravenous contrast. COMPARISON:  08/20/2013 FINDINGS: Brain: Remote appearing right basal ganglial lacunar infarcts are noted though new since the 2014 exam. Minimal small vessel ischemic disease of periventricular white matter. Acute intracranial hemorrhage, intra-axial mass nor extra-axial fluid. Midline fourth ventricle basal  cisterns without effacement. Brainstem and cerebellum appear nonacute. Vascular: No hyperdense vessel sign. Skull: Intact Sinuses/Orbits: Intact Other: None IMPRESSION: 1. Remote appearing right basal ganglial lacunar infarcts. 2. Minimal small vessel ischemic disease of periventricular white matter. 3. No acute intracranial abnormality. Electronically Signed   By: Tollie Eth M.D.   On: 12/18/2018 23:28   Dg Hip Unilat W Or Wo Pelvis 2-3 Views Right  Result Date: 12/18/2018 CLINICAL DATA:  Fall from standing.  RIGHT hip pain. EXAM: DG HIP (WITH OR WITHOUT PELVIS) 2-3V RIGHT COMPARISON:  None. FINDINGS: There is no evidence of hip fracture or dislocation. There is no evidence of arthropathy or other focal bone abnormality. Phleboliths project in the pelvis. Soft tissue planes are non suspicious. L5-S1 PLIF. IMPRESSION: Negative. Electronically Signed   By: Awilda Metro M.D.   On: 12/18/2018 23:21    ____________________________________________   PROCEDURES  Critical Care performed: No   Procedure(s) performed:   Procedures   ____________________________________________   INITIAL IMPRESSION / ASSESSMENT AND PLAN / ED COURSE  As part of my medical decision making, I reviewed the following data within the electronic MEDICAL RECORD NUMBER Nursing notes reviewed and incorporated, Old chart reviewed, Radiograph reviewed , Notes from prior ED visits and [place] Controlled Substance Database    Differential diagnosis includes, but is not limited to, acute on chronic pain, musculoskeletal pain and strain after a mechanical fall, acute fracture or dislocation, acute infection.  The patient is quite somnolent and does not appear to be in acute distress.  As I was finishing up my initial evaluation he was being taken to x-ray and he ask for pain medicine but I explained we need to wait until we find some radiographic evidence of injury given his level of somnolence and slow response to questions.  I  have a low suspicion for acute intracranial injury but given the report of hitting his head and his degree of confusion I will check a CT head without contrast.  However his initial assessment is reassuring with normal vital signs and an essentially normal physical exam except for his baseline left-sided hemiplegia which is relatively mild.  I will obtain radiographs of the right hip and pelvis as well as a CT scan of his head and then reassess.  Clinical Course as of Dec 19 104  Tue Dec 18, 2018  2310 I reviewed the patient's prescription history over the last 24 months in the [place] controlled substance database.  He has at least 87 prescriptions during that time from at least 20  different providers.  He is listed as an overdose risk were of 790 out of 999, which is very high and concerning.  Additionally his last filled prescriptions were within the last 2 weeks and he should still have his chronic pain medicine at home.  It would be inadvisable for him to receive additional prescriptions for controlled substances from the emergency department.    [CF]  Wed Dec 19, 2018  0027 The patient is more awake and alert at this time although he was once again sleeping when I checked on him.  I gave him the results of the head CT and the hip imaging and we talked about his chronic pain.  However he was able to provide more information, including that for about the last week he feels like his ambulation has been a lot more difficult and that his legs do not cooperate with him.  He is not having any more pain in his back than usual but he feels less coordinated than usual and he feels like he is been dizzy and his balance is been off.  Given the nonspecific CT findings of lacunar infarctions and the neurological symptoms he is describing, I think it is appropriate to check an MR brain to make sure there is no evidence of an acute or subacute infarction that could account for his worsening over the last  week.  He agrees with the plan.  He once again asked for pain medicine and I will opt for nonnarcotic options at this time.   [CF]  0100 The patient change his mind and does not want the MRI.  He would rather go home.  He is asking for something for dizziness I am giving him meclizine 25 mg by mouth.  I did also order him 1 Percocet because he does take chronic pain medicine and I was comfortable giving him what is actually a smaller dose than his normal.After the nurse informed me that he is refusing the MRI, I went back and talk to him about it.  He is adamant that he does not want to stay for the MRI.  I reminded him that without the MRI cannot tell him for sure that he has not had a stroke which is the cause of his dizziness and imbalance.  He states he understands and wants to go home.  He is currently awake and alert and has the capacity to make his own decisions.  I provided him with my usual and customary verbal and written instructions to come back at the next fillable opportunity.  I do not think that there is any benefit in making him sign out AMA as this was an informed decision on the patient's part.   [CF]    Clinical Course User Index [CF] Loleta Rose, MD    ____________________________________________  FINAL CLINICAL IMPRESSION(S) / ED DIAGNOSES  Final diagnoses:  Fall, initial encounter  Right hip pain  Dizziness  Chronic pain syndrome     MEDICATIONS GIVEN DURING THIS VISIT:  Medications  meclizine (ANTIVERT) tablet 25 mg (has no administration in time range)  oxyCODONE-acetaminophen (PERCOCET/ROXICET) 5-325 MG per tablet 1 tablet (1 tablet Oral Given 12/19/18 0037)     ED Discharge Orders         Ordered    meclizine (ANTIVERT) 25 MG tablet  3 times daily PRN     12/19/18 0103           Note:  This document was prepared using Dragon voice recognition software  and may include unintentional dictation errors.    Loleta Rose, MD 12/19/18 [phone]

## 2018-12-18 NOTE — ED Triage Notes (Signed)
Patient arrives with c/o fall from standing, c/o right sided hip pain. Hit head, -LOC. 10/10 pain   BP: 131/89 O2: 98% HR: 86

## 2018-12-19 DIAGNOSIS — M25551 Pain in right hip: Secondary | ICD-10-CM | POA: Diagnosis not present

## 2018-12-19 MED ORDER — OXYCODONE-ACETAMINOPHEN 5-325 MG PO TABS
1.0000 | ORAL_TABLET | Freq: Once | ORAL | Status: AC
  Administered 2018-12-19: 1 via ORAL
  Filled 2018-12-19: qty 1

## 2018-12-19 MED ORDER — MECLIZINE HCL 25 MG PO TABS: 25.0000 mg | ORAL_TABLET | Freq: Three times a day (TID) | ORAL | 0 refills | Status: DC | PRN

## 2018-12-19 MED ORDER — MECLIZINE HCL 25 MG PO TABS
25.0000 mg | ORAL_TABLET | Freq: Once | ORAL | Status: AC
  Administered 2018-12-19: 25 mg via ORAL
  Filled 2018-12-19 (×2): qty 1

## 2018-12-19 NOTE — ED Notes (Signed)
Patient requesting not to have MRI performed. Patient requesting medication for dizziness. Dr. York CeriseForbach made aware.

## 2018-12-19 NOTE — ED Notes (Signed)
Patient verbalized understanding of discharge instructions, no questions. Patient out of ED via wheelchair in no distress.  

## 2018-12-19 NOTE — Discharge Instructions (Signed)
As we discussed, your hip x-rays and pelvis x-rays were normal and do not show any sign of injury.  Your head CT did not show any acute changes, but given your reported difficulty with dizziness and difficulty with walking, we recommended that you get an MRI of your brain.  However after consideration you do not want the MRI tonight.  This is certainly your choice to make, but if your symptoms get worse or if you develop any other new symptoms that concern you, please return to the emergency department.  Otherwise follow-up with your regular doctor at the next available opportunity.

## 2018-12-25 ENCOUNTER — Other Ambulatory Visit: Payer: Self-pay | Admitting: Family Medicine

## 2018-12-25 ENCOUNTER — Telehealth: Payer: Self-pay | Admitting: Family Medicine

## 2018-12-25 ENCOUNTER — Ambulatory Visit: Payer: Medicare HMO

## 2018-12-25 DIAGNOSIS — R42 Dizziness and giddiness: Secondary | ICD-10-CM

## 2018-12-25 NOTE — Telephone Encounter (Signed)
Pt called requesting a refill on meclizine 25 mg. Pt call back # is [phone]

## 2018-12-25 NOTE — Telephone Encounter (Signed)
This was filled on 12/25.  Can you call patient to ask if he has used 30 tablets since that day?  If so, he is taking too many.

## 2018-12-27 ENCOUNTER — Encounter: Payer: Medicare HMO | Admitting: Occupational Therapy

## 2018-12-31 ENCOUNTER — Emergency Department: Payer: Medicare HMO

## 2018-12-31 ENCOUNTER — Other Ambulatory Visit: Payer: Self-pay

## 2018-12-31 ENCOUNTER — Emergency Department
Admission: EM | Admit: 2018-12-31 | Discharge: 2018-12-31 | Disposition: A | Discharge status: Home / Self Care | Payer: Medicare HMO | Attending: Emergency Medicine | Admitting: Emergency Medicine

## 2018-12-31 DIAGNOSIS — Z79899 Other long term (current) drug therapy: Secondary | ICD-10-CM | POA: Diagnosis not present

## 2018-12-31 DIAGNOSIS — Z7902 Long term (current) use of antithrombotics/antiplatelets: Secondary | ICD-10-CM | POA: Diagnosis not present

## 2018-12-31 DIAGNOSIS — I10 Essential (primary) hypertension: Secondary | ICD-10-CM | POA: Insufficient documentation

## 2018-12-31 DIAGNOSIS — F99 Mental disorder, not otherwise specified: Secondary | ICD-10-CM | POA: Diagnosis present

## 2018-12-31 DIAGNOSIS — F1010 Alcohol abuse, uncomplicated: Secondary | ICD-10-CM | POA: Insufficient documentation

## 2018-12-31 DIAGNOSIS — F1410 Cocaine abuse, uncomplicated: Secondary | ICD-10-CM | POA: Insufficient documentation

## 2018-12-31 DIAGNOSIS — F17210 Nicotine dependence, cigarettes, uncomplicated: Secondary | ICD-10-CM | POA: Insufficient documentation

## 2018-12-31 DIAGNOSIS — F419 Anxiety disorder, unspecified: Secondary | ICD-10-CM | POA: Diagnosis not present

## 2018-12-31 DIAGNOSIS — E876 Hypokalemia: Secondary | ICD-10-CM | POA: Diagnosis not present

## 2018-12-31 DIAGNOSIS — R4182 Altered mental status, unspecified: Secondary | ICD-10-CM | POA: Insufficient documentation

## 2018-12-31 DIAGNOSIS — F329 Major depressive disorder, single episode, unspecified: Secondary | ICD-10-CM | POA: Insufficient documentation

## 2018-12-31 LAB — URINE DRUG SCREEN, QUALITATIVE (ARMC ONLY)
AMPHETAMINES, UR SCREEN: NOT DETECTED
Barbiturates, Ur Screen: NOT DETECTED
Benzodiazepine, Ur Scrn: NOT DETECTED
Cannabinoid 50 Ng, Ur ~~LOC~~: NOT DETECTED
Cocaine Metabolite,Ur ~~LOC~~: POSITIVE — AB
MDMA (ECSTASY) UR SCREEN: NOT DETECTED
Methadone Scn, Ur: NOT DETECTED
Opiate, Ur Screen: NOT DETECTED
Phencyclidine (PCP) Ur S: NOT DETECTED
TRICYCLIC, UR SCREEN: NOT DETECTED

## 2018-12-31 LAB — COMPREHENSIVE METABOLIC PANEL
ALBUMIN: 4.5 g/dL (ref 3.5–5.0)
ALT: 18 U/L (ref 0–44)
AST: 28 U/L (ref 15–41)
Alkaline Phosphatase: 73 U/L (ref 38–126)
Anion gap: 11 (ref 5–15)
BUN: 15 mg/dL (ref 6–20)
CHLORIDE: 97 mmol/L — AB (ref 98–111)
CO2: 30 mmol/L (ref 22–32)
CREATININE: 0.79 mg/dL (ref 0.61–1.24)
Calcium: 9.1 mg/dL (ref 8.9–10.3)
GFR calc Af Amer: >60 mL/min (ref >60)
GFR calc non Af Amer: >60 mL/min (ref >60)
Glucose, Bld: 120 mg/dL — ABNORMAL HIGH (ref 70–99)
Potassium: 2.1 mmol/L — CL (ref 3.5–5.1)
SODIUM: 138 mmol/L (ref 135–145)
Total Bilirubin: 1 mg/dL (ref 0.3–1.2)
Total Protein: 7.2 g/dL (ref 6.5–8.1)

## 2018-12-31 LAB — CBC WITH DIFFERENTIAL/PLATELET
ABS IMMATURE GRANULOCYTES: 0.04 10*3/uL (ref 0.00–0.07)
BASOS PCT: 0 %
Basophils Absolute: 0 10*3/uL (ref 0.0–0.1)
Eosinophils Absolute: 0 10*3/uL (ref 0.0–0.5)
Eosinophils Relative: 0 %
HCT: 42.6 % (ref 39.0–52.0)
Hemoglobin: 14.7 g/dL (ref 13.0–17.0)
IMMATURE GRANULOCYTES: 0 %
Lymphocytes Relative: 16 %
Lymphs Abs: 1.8 10*3/uL (ref 0.7–4.0)
MCH: 30.4 pg (ref 26.0–34.0)
MCHC: 34.5 g/dL (ref 30.0–36.0)
MCV: 88.2 fL (ref 80.0–100.0)
MONOS PCT: 6 %
Monocytes Absolute: 0.7 10*3/uL (ref 0.1–1.0)
NEUTROS ABS: 9 10*3/uL — AB (ref 1.7–7.7)
NEUTROS PCT: 78 %
PLATELETS: 296 10*3/uL (ref 150–400)
RBC: 4.83 MIL/uL (ref 4.22–5.81)
RDW: 11.9 % (ref 11.5–15.5)
WBC: 11.6 10*3/uL — ABNORMAL HIGH (ref 4.0–10.5)
nRBC: 0 % (ref 0.0–0.2)

## 2018-12-31 LAB — ACETAMINOPHEN LEVEL: Acetaminophen (Tylenol), Serum: <10 ug/mL — ABNORMAL LOW (ref 10–30)

## 2018-12-31 LAB — ETHANOL: Alcohol, Ethyl (B): <10 mg/dL (ref <10)

## 2018-12-31 LAB — SALICYLATE LEVEL

## 2018-12-31 LAB — CK: CK TOTAL: 1080 U/L — AB (ref 49–397)

## 2018-12-31 MED ORDER — POTASSIUM CHLORIDE ER 10 MEQ PO TBCR: 10.0000 meq | EXTENDED_RELEASE_TABLET | Freq: Two times a day (BID) | ORAL | 0 refills | Status: DC

## 2018-12-31 MED ORDER — MAGNESIUM GLUCONATE 500 MG PO TABS
500.0000 mg | ORAL_TABLET | Freq: Once | ORAL | Status: AC
  Administered 2018-12-31: 500 mg via ORAL
  Filled 2018-12-31: qty 1

## 2018-12-31 MED ORDER — POTASSIUM CHLORIDE CRYS ER 20 MEQ PO TBCR
80.0000 meq | EXTENDED_RELEASE_TABLET | Freq: Once | ORAL | Status: AC
  Administered 2018-12-31: 80 meq via ORAL
  Filled 2018-12-31: qty 4

## 2018-12-31 MED ORDER — ONDANSETRON 4 MG PO TBDP: 4.0000 mg | ORAL_TABLET | Freq: Three times a day (TID) | ORAL | 0 refills | Status: DC | PRN

## 2018-12-31 NOTE — ED Notes (Signed)
Pt confused about why he was brought here but cooperative. Process explained to pt and he verbalized understanding.

## 2018-12-31 NOTE — Discharge Instructions (Addendum)
Please begin taking your potassium supplements twice a day as prescribed as well as your nausea medication as needed.  Follow up with your PMD within 1 week at the latest for a recheck and return to the ED sooner for any concerns.  It was a pleasure to take care of you today, and thank you for coming to our emergency department.  If you have any questions or concerns before leaving please ask the nurse to grab me and I'm more than happy to go through your aftercare instructions again.  If you have any concerns once you are home that you are not improving or are in fact getting worse before you can make it to your follow-up appointment, please do not hesitate to call 911 and come back for further evaluation.  Merrily Brittle, MD  Results for orders placed or performed during the hospital encounter of 12/31/18  Acetaminophen level  Result Value Ref Range   Acetaminophen (Tylenol), Serum <10 (L) 10 - 30 ug/mL  Ethanol  Result Value Ref Range   Alcohol, Ethyl (B) <10 <10 mg/dL  Salicylate level  Result Value Ref Range   Salicylate Lvl <7.0 2.8 - 30.0 mg/dL  CBC with Differential  Result Value Ref Range   WBC 11.6 (H) 4.0 - 10.5 K/uL   RBC 4.83 4.22 - 5.81 MIL/uL   Hemoglobin 14.7 13.0 - 17.0 g/dL   HCT 33.0 07.6 - 22.6 %   MCV 88.2 80.0 - 100.0 fL   MCH 30.4 26.0 - 34.0 pg   MCHC 34.5 30.0 - 36.0 g/dL   RDW 33.3 54.5 - 62.5 %   Platelets 296 150 - 400 K/uL   nRBC 0.0 0.0 - 0.2 %   Neutrophils Relative % 78 %   Neutro Abs 9.0 (H) 1.7 - 7.7 K/uL   Lymphocytes Relative 16 %   Lymphs Abs 1.8 0.7 - 4.0 K/uL   Monocytes Relative 6 %   Monocytes Absolute 0.7 0.1 - 1.0 K/uL   Eosinophils Relative 0 %   Eosinophils Absolute 0.0 0.0 - 0.5 K/uL   Basophils Relative 0 %   Basophils Absolute 0.0 0.0 - 0.1 K/uL   Immature Granulocytes 0 %   Abs Immature Granulocytes 0.04 0.00 - 0.07 K/uL  Urine Drug Screen, Qualitative  Result Value Ref Range   Tricyclic, Ur Screen NONE DETECTED NONE  DETECTED   Amphetamines, Ur Screen NONE DETECTED NONE DETECTED   MDMA (Ecstasy)Ur Screen NONE DETECTED NONE DETECTED   Cocaine Metabolite,Ur [place] POSITIVE (A) NONE DETECTED   Opiate, Ur Screen NONE DETECTED NONE DETECTED   Phencyclidine (PCP) Ur S NONE DETECTED NONE DETECTED   Cannabinoid 50 Ng, Ur [place] NONE DETECTED NONE DETECTED   Barbiturates, Ur Screen NONE DETECTED NONE DETECTED   Benzodiazepine, Ur Scrn NONE DETECTED NONE DETECTED   Methadone Scn, Ur NONE DETECTED NONE DETECTED  CK  Result Value Ref Range   Total CK 1,080 (H) 49 - 397 U/L  Comprehensive metabolic panel  Result Value Ref Range   Sodium 138 135 - 145 mmol/L   Potassium 2.1 (LL) 3.5 - 5.1 mmol/L   Chloride 97 (L) 98 - 111 mmol/L   CO2 30 22 - 32 mmol/L   Glucose, Bld 120 (H) 70 - 99 mg/dL   BUN 15 6 - 20 mg/dL   Creatinine, Ser 6.38 0.61 - 1.24 mg/dL   Calcium 9.1 8.9 - 93.7 mg/dL   Total Protein 7.2 6.5 - 8.1 g/dL   Albumin 4.5 3.5 -  5.0 g/dL   AST 28 15 - 41 U/L   ALT 18 0 - 44 U/L   Alkaline Phosphatase 73 38 - 126 U/L   Total Bilirubin 1.0 0.3 - 1.2 mg/dL   GFR calc non Af Amer >60 >60 mL/min   GFR calc Af Amer >60 >60 mL/min   Anion gap 11 5 - 15   Ct Head Wo Contrast  Result Date: 12/31/2018 CLINICAL DATA:  Altered mental status. Trauma to. EXAM: CT HEAD WITHOUT CONTRAST TECHNIQUE: Contiguous axial images were obtained from the base of the skull through the vertex without intravenous contrast. COMPARISON:  12/18/2018 and 08/20/2013 FINDINGS: Brain: Remote right basal ganglial lacunar infarcts with chronic minimal small vessel ischemic disease. No acute intracranial hemorrhage, midline shift or edema. No extra-axial fluid. Midline fourth ventricle and basal cisterns without effacement. The brainstem and cerebellum are nonacute. Vascular: No hyperdense vessel sign. Scattered atherosclerosis of the carotid siphons bilaterally. Skull: Intact Sinuses/Orbits: Intact Other: Intact IMPRESSION: Remote right basal  ganglial lacunar infarcts with chronic minimal small vessel ischemic disease. No acute intracranial abnormality. Electronically Signed   By: Tollie Eth M.D.   On: 12/31/2018 03:16   Ct Head Wo Contrast  Result Date: 12/18/2018 CLINICAL DATA:  Left frontal injury after fall. EXAM: CT HEAD WITHOUT CONTRAST TECHNIQUE: Contiguous axial images were obtained from the base of the skull through the vertex without intravenous contrast. COMPARISON:  08/20/2013 FINDINGS: Brain: Remote appearing right basal ganglial lacunar infarcts are noted though new since the 2014 exam. Minimal small vessel ischemic disease of periventricular white matter. Acute intracranial hemorrhage, intra-axial mass nor extra-axial fluid. Midline fourth ventricle basal cisterns without effacement. Brainstem and cerebellum appear nonacute. Vascular: No hyperdense vessel sign. Skull: Intact Sinuses/Orbits: Intact Other: None IMPRESSION: 1. Remote appearing right basal ganglial lacunar infarcts. 2. Minimal small vessel ischemic disease of periventricular white matter. 3. No acute intracranial abnormality. Electronically Signed   By: Tollie Eth M.D.   On: 12/18/2018 23:28   Dg Hip Unilat W Or Wo Pelvis 2-3 Views Right  Result Date: 12/18/2018 CLINICAL DATA:  Fall from standing.  RIGHT hip pain. EXAM: DG HIP (WITH OR WITHOUT PELVIS) 2-3V RIGHT COMPARISON:  None. FINDINGS: There is no evidence of hip fracture or dislocation. There is no evidence of arthropathy or other focal bone abnormality. Phleboliths project in the pelvis. Soft tissue planes are non suspicious. L5-S1 PLIF. IMPRESSION: Negative. Electronically Signed   By: Awilda Metro M.D.   On: 12/18/2018 23:21

## 2018-12-31 NOTE — ED Notes (Addendum)
Pt dressed out by this tech and Hall, EDT. Pt calm and cooperative. Pts belongings put into correctly labeled bags and locked in secure area. Pts belongings include: jacket, gray shirt, knee brace, pair of socks, hat, cane, two pair of underwear, shorts, sweatpants, wallet and pair of black shoes.

## 2018-12-31 NOTE — ED Provider Notes (Signed)
[facility] Emergency Department Provider Note  ____________________________________________   First MD Initiated Contact with Patient 12/31/18 [phone]     (approximate)  I have reviewed the triage vital signs and the nursing notes.   HISTORY  Chief Complaint Psychiatric Evaluation    HPI Mark Wells is a 48 y.o. male who comes to the emergency department via EMS after tripping and falling at the motel [place] he was standing.  This evening he was drinking alcohol and smoking crack cocaine when he fell down and someone working at the [employer] called 911.  The patient denies suicidal ideation or homicidal ideation.  He says he does feel somewhat "down" but has never tried to hurt himself and he contracts for safety.  He does not want to be here.  His symptoms came on suddenly were mild in severity and nothing seems to make them better or worse.  He denies chest pain shortness of breath abdominal pain nausea or vomiting.  He has no complaints and would like to go home.    Past Medical History:  Diagnosis Date  . Anemia   . Anxiety   . Chronic back pain   . Depression   . Hemiplegia affecting left nondominant side (HCC)   . Hyperlipidemia   . Mood disorder (HCC)   . Personality disorder (HCC)   . Restless leg syndrome   . Sleep apnea     Patient Active Problem List   Diagnosis Date Noted  . OSA on CPAP 12/09/2018  . Chronic pain syndrome 12/09/2018  . Major depression, recurrent, chronic (HCC) 12/09/2018  . Insomnia 12/09/2018  . Spastic hemiplegia of left dominant side as late effect of cerebral infarction (HCC) 12/07/2018  . Painful orthopaedic hardware (HCC) 09/03/2018  . BACK PAIN 01/07/2011  . ADVERSE DRUG REACTION 01/01/2011  . Essential hypertension 12/28/2010  . LOW BACK PAIN SYNDROME, SEVERE 12/28/2010  . OTHER URINARY INCONTINENCE 12/28/2010    Past Surgical History:  Procedure Laterality Date  . BACK SURGERY      Prior to Admission  medications   Medication Sig Start Date End Date Taking? Authorizing Provider  amLODipine (NORVASC) 5 MG tablet Take 5 mg by mouth daily. 07/20/18   [provider]  atorvastatin (LIPITOR) 40 MG tablet Take 1 tablet (40 mg total) by mouth every evening. 12/07/18   Karamalegos, Netta NeatAlexander J, DO  chlorthalidone (HYGROTON) 25 MG tablet Take 25 mg by mouth daily. 07/20/18   [provider]  clopidogrel (PLAVIX) 75 MG tablet Take 1 tablet (75 mg total) by mouth daily. 12/07/18   Karamalegos, Netta NeatAlexander J, DO  losartan (COZAAR) 100 MG tablet Take 100 mg by mouth daily. 07/20/18   [provider]  meclizine (ANTIVERT) 25 MG tablet Take 1 tablet (25 mg total) by mouth 3 (three) times daily as needed for dizziness. 12/19/18   Loleta RoseForbach, Cory, MD  methocarbamol (ROBAXIN) 500 MG tablet Take 1 tablet (500 mg total) by mouth every 6 (six) hours as needed for muscle spasms. 12/07/18   Karamalegos, Netta NeatAlexander J, DO  ondansetron (ZOFRAN ODT) 4 MG disintegrating tablet Take 1 tablet (4 mg total) by mouth every 8 (eight) hours as needed for nausea or vomiting. 12/31/18   Merrily Brittleifenbark, Puneet Selden, MD  potassium chloride (K-DUR) 10 MEQ tablet Take 1 tablet (10 mEq total) by mouth 2 (two) times daily. 12/31/18   Merrily Brittleifenbark, Seanmichael Salmons, MD  predniSONE (DELTASONE) 20 MG tablet Take daily with food. Start with 60mg  (3 pills) x 2 days, then reduce  to 40mg  (2 pills) x 2 days, then 20mg  (1 pill) x 3 days 12/07/18   Smitty Cords, DO  pregabalin (LYRICA) 300 MG capsule Take 300 mg by mouth 2 (two) times daily.     [provider]    Allergies Aspirin; Ibuprofen; and Shellfish allergy  No family history on file.  Social History Social History   Tobacco Use  . Smoking status: Current Every Day Smoker    Packs/day: 0.50    Types: Cigarettes  . Smokeless tobacco: Never Used  Substance Use Topics  . Alcohol use: No  . Drug use: No    Review of Systems Constitutional: No fever/chills Eyes: No  visual changes. ENT: No sore throat. Cardiovascular: Denies chest pain. Respiratory: Denies shortness of breath. Gastrointestinal: No abdominal pain.  No nausea, no vomiting.  No diarrhea.  No constipation. Genitourinary: Negative for dysuria. Musculoskeletal: Negative for back pain. Skin: Negative for rash. Neurological: Negative for headaches, focal weakness or numbness.   ____________________________________________   PHYSICAL EXAM:  VITAL SIGNS: ED Triage Vitals  Enc Vitals Group     BP      Pulse      Resp      Temp      Temp src      SpO2      Weight      Height      Head Circumference      Peak Flow      Pain Score      Pain Loc      Pain Edu?      Excl. in GC?     Constitutional: Alert and oriented x4 nontoxic no diaphoresis speaks in full clear sentences Eyes: PERRL EOMI. midrange and brisk Head: Atraumatic. Nose: No congestion/rhinnorhea. Mouth/Throat: No trismus no fasciculations Neck: No stridor.   Cardiovascular: Normal rate, regular rhythm. Grossly normal heart sounds.  Good peripheral circulation. Respiratory: Normal respiratory effort.  No retractions. Lungs CTAB and moving good air Gastrointestinal: Soft nontender Musculoskeletal: No lower extremity edema   Neurologic:  Normal speech and language. No gross focal neurologic deficits are appreciated. Skin:  Skin is warm, dry and intact. No rash noted. Psychiatric: Mood and affect are normal. Speech and behavior are normal.    ____________________________________________   DIFFERENTIAL includes but not limited to  Alcohol intoxication, cocaine intoxication, dehydration, suicidality, rhabdomyolysis, intracerebral hemorrhage ____________________________________________   LABS (all labs ordered are listed, but only abnormal results are displayed)  Labs Reviewed  ACETAMINOPHEN LEVEL - Abnormal; Notable for the following components:      Result Value   Acetaminophen (Tylenol), Serum <10 (*)     All other components within normal limits  CBC WITH DIFFERENTIAL/PLATELET - Abnormal; Notable for the following components:   WBC 11.6 (*)    Neutro Abs 9.0 (*)    All other components within normal limits  URINE DRUG SCREEN, QUALITATIVE ([facility] ONLY) - Abnormal; Notable for the following components:   Cocaine Metabolite,Ur [place] POSITIVE (*)    All other components within normal limits  CK - Abnormal; Notable for the following components:   Total CK 1,080 (*)    All other components within normal limits  COMPREHENSIVE METABOLIC PANEL - Abnormal; Notable for the following components:   Potassium 2.1 (*)    Chloride 97 (*)    Glucose, Bld 120 (*)    All other components within normal limits  ETHANOL  SALICYLATE LEVEL    Lab work reviewed by me with a number of  abnormalities.  He is cocaine positive.  His CK is slightly elevated although with normal renal function.  He is K is low and likely related to shift __________________________________________  EKG  ED ECG REPORT I, Merrily BrittleNeil Hollin Crewe, the attending physician, personally viewed and interpreted this ECG.  Date: 12/31/2018 EKG Time:  Rate: 81 Rhythm: normal sinus rhythm QRS Axis: normal Intervals: normal ST/T Wave abnormalities: normal Narrative Interpretation: no evidence of acute ischemia  ____________________________________________  RADIOLOGY  CT scan of the head reviewed by me with no acute disease ____________________________________________   PROCEDURES  Procedure(s) performed: no  Procedures  Critical Care performed: no  ____________________________________________   INITIAL IMPRESSION / ASSESSMENT AND PLAN / ED COURSE  Pertinent labs & imaging results that were available during my care of the patient were reviewed by me and considered in my medical decision making (see chart for details).   As part of my medical decision making, I reviewed the following data within the electronic MEDICAL RECORD NUMBER History  obtained from family if available, nursing notes, old chart and ekg, as well as notes from prior ED visits.  The patient arrives via EMS and is not under involuntary commitment.  She contracts for safety.  He does agree to have general blood work specifically to evaluate for renal function and rhabdomyolysis.    ----------------------------------------- 3:42 AM on 12/31/2018 -----------------------------------------  The patient has a slightly elevated CK and potassium of 2.1.  The potassium is likely largely related to shift from his acute cocaine ingestion this evening.  His elevated CK also is likely secondary to cocaine use and he has no renal dysfunction.  I offered to keep the patient in the hospital for IV hydration and recheck of his lab work however he declined stating he would like to go home which I think is entirely reasonable.  Will be loaded with oral K here and I will discharge him with potassium for the next month.  We will give him primary care follow-up and strict return precautions have been given.  ____________________________________________   FINAL CLINICAL IMPRESSION(S) / ED DIAGNOSES  Final diagnoses:  Cocaine abuse (HCC)  Alcohol abuse  Hypokalemia      NEW MEDICATIONS STARTED DURING THIS VISIT:  New Prescriptions   ONDANSETRON (ZOFRAN ODT) 4 MG DISINTEGRATING TABLET    Take 1 tablet (4 mg total) by mouth every 8 (eight) hours as needed for nausea or vomiting.   POTASSIUM CHLORIDE (K-DUR) 10 MEQ TABLET    Take 1 tablet (10 mEq total) by mouth 2 (two) times daily.     Note:  This document was prepared using Dragon voice recognition software and may include unintentional dictation errors.    Merrily Brittleifenbark, Vaughn Frieze, MD 12/31/18 [phone]

## 2018-12-31 NOTE — ED Notes (Signed)
Lab called for add on 

## 2018-12-31 NOTE — ED Notes (Signed)
CRITICAL LAB: POTASSIUM is 2.1, [facility], Dr. Lamont Snowball notified, orders received

## 2018-12-31 NOTE — ED Triage Notes (Signed)
Patient to Rm 22 via EMS from local motel.  Per EMS patient had fallen and called for help and when police arrived they overheard patient stated that self harm ideations.  Per EMS patient has used cocaine and beer tonight.  Patient denies SI or HI reports he was just trying to forget his problems.  Patient states he used $20 worth of cocaine and has had 1 12oz beer tonight.  Per Officer Pride with [employer] heard patient say he wanted to end his problems.

## 2019-01-01 ENCOUNTER — Encounter: Payer: Self-pay | Admitting: Emergency Medicine

## 2019-01-01 ENCOUNTER — Other Ambulatory Visit: Payer: Self-pay

## 2019-01-01 ENCOUNTER — Emergency Department
Admission: EM | Admit: 2019-01-01 | Discharge: 2019-01-01 | Disposition: A | Discharge status: Home / Self Care | Payer: Medicare HMO | Attending: Emergency Medicine | Admitting: Emergency Medicine

## 2019-01-01 DIAGNOSIS — Z79899 Other long term (current) drug therapy: Secondary | ICD-10-CM | POA: Insufficient documentation

## 2019-01-01 DIAGNOSIS — Y9301 Activity, walking, marching and hiking: Secondary | ICD-10-CM | POA: Diagnosis not present

## 2019-01-01 DIAGNOSIS — Y929 Unspecified place or not applicable: Secondary | ICD-10-CM | POA: Diagnosis not present

## 2019-01-01 DIAGNOSIS — F17210 Nicotine dependence, cigarettes, uncomplicated: Secondary | ICD-10-CM | POA: Insufficient documentation

## 2019-01-01 DIAGNOSIS — Y999 Unspecified external cause status: Secondary | ICD-10-CM | POA: Insufficient documentation

## 2019-01-01 DIAGNOSIS — Z7902 Long term (current) use of antithrombotics/antiplatelets: Secondary | ICD-10-CM | POA: Insufficient documentation

## 2019-01-01 DIAGNOSIS — R040 Epistaxis: Secondary | ICD-10-CM | POA: Diagnosis not present

## 2019-01-01 DIAGNOSIS — E876 Hypokalemia: Secondary | ICD-10-CM

## 2019-01-01 DIAGNOSIS — S0993XA Unspecified injury of face, initial encounter: Secondary | ICD-10-CM | POA: Insufficient documentation

## 2019-01-01 DIAGNOSIS — Z8673 Personal history of transient ischemic attack (TIA), and cerebral infarction without residual deficits: Secondary | ICD-10-CM | POA: Diagnosis not present

## 2019-01-01 DIAGNOSIS — W010XXA Fall on same level from slipping, tripping and stumbling without subsequent striking against object, initial encounter: Secondary | ICD-10-CM | POA: Diagnosis not present

## 2019-01-01 DIAGNOSIS — I10 Essential (primary) hypertension: Secondary | ICD-10-CM | POA: Insufficient documentation

## 2019-01-01 LAB — COMPREHENSIVE METABOLIC PANEL
ALK PHOS: 69 U/L (ref 38–126)
ALT: 20 U/L (ref 0–44)
AST: 37 U/L (ref 15–41)
Albumin: 3.8 g/dL (ref 3.5–5.0)
Anion gap: 10 (ref 5–15)
BUN: 40 mg/dL — ABNORMAL HIGH (ref 6–20)
CO2: 29 mmol/L (ref 22–32)
CREATININE: 0.9 mg/dL (ref 0.61–1.24)
Calcium: 8.9 mg/dL (ref 8.9–10.3)
Chloride: 98 mmol/L (ref 98–111)
Glucose, Bld: 111 mg/dL — ABNORMAL HIGH (ref 70–99)
Potassium: 2.8 mmol/L — ABNORMAL LOW (ref 3.5–5.1)
Sodium: 137 mmol/L (ref 135–145)
TOTAL PROTEIN: 7 g/dL (ref 6.5–8.1)
Total Bilirubin: 1 mg/dL (ref 0.3–1.2)

## 2019-01-01 LAB — CBC WITH DIFFERENTIAL/PLATELET
ABS IMMATURE GRANULOCYTES: 0.03 10*3/uL (ref 0.00–0.07)
BASOS PCT: 1 %
Basophils Absolute: 0.1 10*3/uL (ref 0.0–0.1)
Eosinophils Absolute: 0.1 10*3/uL (ref 0.0–0.5)
Eosinophils Relative: 1 %
HCT: 40.5 % (ref 39.0–52.0)
Hemoglobin: 13.4 g/dL (ref 13.0–17.0)
Immature Granulocytes: 0 %
Lymphocytes Relative: 33 %
Lymphs Abs: 3.2 10*3/uL (ref 0.7–4.0)
MCH: 30.7 pg (ref 26.0–34.0)
MCHC: 33.1 g/dL (ref 30.0–36.0)
MCV: 92.7 fL (ref 80.0–100.0)
Monocytes Absolute: 0.8 10*3/uL (ref 0.1–1.0)
Monocytes Relative: 8 %
NEUTROS ABS: 5.6 10*3/uL (ref 1.7–7.7)
Neutrophils Relative %: 57 %
PLATELETS: 290 10*3/uL (ref 150–400)
RBC: 4.37 MIL/uL (ref 4.22–5.81)
RDW: 12 % (ref 11.5–15.5)
WBC: 9.9 10*3/uL (ref 4.0–10.5)
nRBC: 0 % (ref 0.0–0.2)

## 2019-01-01 LAB — PROTIME-INR
INR: 1.09
PROTHROMBIN TIME: 14 s (ref 11.4–15.2)

## 2019-01-01 LAB — APTT: aPTT: 28 seconds (ref 24–36)

## 2019-01-01 MED ORDER — POTASSIUM CHLORIDE CRYS ER 20 MEQ PO TBCR
40.0000 meq | EXTENDED_RELEASE_TABLET | Freq: Once | ORAL | Status: AC
  Administered 2019-01-01: 40 meq via ORAL
  Filled 2019-01-01: qty 2

## 2019-01-01 MED ORDER — MECLIZINE HCL 25 MG PO TABS: 25.0000 mg | ORAL_TABLET | Freq: Three times a day (TID) | ORAL | 0 refills | Status: DC | PRN

## 2019-01-01 NOTE — Telephone Encounter (Signed)
Pt requesting a refill of the Meclizine. He reports that he is taking the medication three times day for severe dizziness. Pt states he was so dizzy yesterday that he fell down and bust his nose and had to go to the hospital. Please advise

## 2019-01-01 NOTE — Discharge Instructions (Signed)
Follow-up with your primary care provider if any continued problems.  Also pick up your prescription for potassium today.  Your first dose was given to you in the emergency department.  For the remainder of today do not blow your nose and try not to sneeze.  If you begin bleeding from your nose again apply pressure as was done while in the ED. Follow-up with your primary care provider about your low potassium also.

## 2019-01-01 NOTE — ED Triage Notes (Signed)
Pt to triage via w/c with no distress noted, nose clamp in place; pt brought in by EMS who admin afrin spray enroute; Pt reports right sided nosebleed since 8pm; denies hx of same; denies c/o

## 2019-01-01 NOTE — ED Notes (Signed)
See triage note  States he fell and hit his nose  Developed nose bleed   No bleeding noted at present  Dried blood nose around nasal area

## 2019-01-01 NOTE — ED Provider Notes (Signed)
[facility] Emergency Department Provider Note   ____________________________________________   First MD Initiated Contact with Patient 01/01/19 [phone]     (approximate)  I have reviewed the triage vital signs and the nursing notes.   HISTORY  Chief Complaint No chief complaint on file.   HPI Mark Wells is a 48 y.o. male presents to the ED via EMS with right-sided nosebleed.  Patient states that he tripped and fell on carpet hitting his nose.  He states that the injury and tripping was secondary to his stroke.  He denies any loss of consciousness or hitting his head.  He denies any pain to his nose and now the nosebleed has stopped.  Patient has been in the ED lobby most of the night and reports that the nosebleed started at 8 PM..  Lab work indicates that patient is hypokalemic.  Patient states that he is aware of this and that a prescription for potassium was sent to his pharmacy which she has not had time to pick up.  He denies any other injuries.  He rates his pain as a 0/10.  He voices that he is ready to go home.   Past Medical History:  Diagnosis Date  . Anemia   . Anxiety   . Chronic back pain   . Depression   . Hemiplegia affecting left nondominant side (HCC)   . Hyperlipidemia   . Mood disorder (HCC)   . Personality disorder (HCC)   . Restless leg syndrome   . Sleep apnea     Patient Active Problem List   Diagnosis Date Noted  . OSA on CPAP 12/09/2018  . Chronic pain syndrome 12/09/2018  . Major depression, recurrent, chronic (HCC) 12/09/2018  . Insomnia 12/09/2018  . Spastic hemiplegia of left dominant side as late effect of cerebral infarction (HCC) 12/07/2018  . Painful orthopaedic hardware (HCC) 09/03/2018  . BACK PAIN 01/07/2011  . ADVERSE DRUG REACTION 01/01/2011  . Essential hypertension 12/28/2010  . LOW BACK PAIN SYNDROME, SEVERE 12/28/2010  . OTHER URINARY INCONTINENCE 12/28/2010    Past Surgical History:  Procedure  Laterality Date  . BACK SURGERY      Prior to Admission medications   Medication Sig Start Date End Date Taking? Authorizing Provider  amLODipine (NORVASC) 5 MG tablet Take 5 mg by mouth daily. 07/20/18   [provider]  atorvastatin (LIPITOR) 40 MG tablet Take 1 tablet (40 mg total) by mouth every evening. 12/07/18   Karamalegos, Netta NeatAlexander J, DO  chlorthalidone (HYGROTON) 25 MG tablet Take 25 mg by mouth daily. 07/20/18   [provider]  clopidogrel (PLAVIX) 75 MG tablet Take 1 tablet (75 mg total) by mouth daily. 12/07/18   Karamalegos, Netta NeatAlexander J, DO  losartan (COZAAR) 100 MG tablet Take 100 mg by mouth daily. 07/20/18   [provider]  meclizine (ANTIVERT) 25 MG tablet Take 1 tablet (25 mg total) by mouth 3 (three) times daily as needed for dizziness. 12/19/18   Loleta RoseForbach, Cory, MD  methocarbamol (ROBAXIN) 500 MG tablet Take 1 tablet (500 mg total) by mouth every 6 (six) hours as needed for muscle spasms. 12/07/18   Karamalegos, Netta NeatAlexander J, DO  ondansetron (ZOFRAN ODT) 4 MG disintegrating tablet Take 1 tablet (4 mg total) by mouth every 8 (eight) hours as needed for nausea or vomiting. 12/31/18   Merrily Brittleifenbark, Neil, MD  potassium chloride (K-DUR) 10 MEQ tablet Take 1 tablet (10 mEq total) by mouth 2 (two) times daily. 12/31/18   Rifenbark,  Lloyd Huger, MD  predniSONE (DELTASONE) 20 MG tablet Take daily with food. Start with 60mg  (3 pills) x 2 days, then reduce to 40mg  (2 pills) x 2 days, then 20mg  (1 pill) x 3 days 12/07/18   Smitty Cords, DO  pregabalin (LYRICA) 300 MG capsule Take 300 mg by mouth 2 (two) times daily.     [provider]    Allergies Aspirin; Ibuprofen; and Shellfish allergy  No family history on file.  Social History Social History   Tobacco Use  . Smoking status: Current Every Day Smoker    Packs/day: 0.50    Types: Cigarettes  . Smokeless tobacco: Never Used  Substance Use Topics  . Alcohol use: No  . Drug use: No     Review of Systems Constitutional: No fever/chills Eyes: No visual changes. ENT: Nosebleed right side resolved. Cardiovascular: Denies chest pain. Respiratory: Denies shortness of breath. Gastrointestinal: No abdominal pain.  No nausea, no vomiting.  Musculoskeletal: Negative for muscle skeletal pain. Skin: Negative for rash. Neurological: Negative for headaches.  Hemiplegia affecting left side secondary to CVA. ___________________________________________   PHYSICAL EXAM:  VITAL SIGNS: ED Triage Vitals [01/01/19 0220]  Enc Vitals Group     BP (!) 120/95     Pulse Rate (!) 110     Resp 20     Temp      Temp src      SpO2 97 %     Weight 200 lb (90.7 kg)     Height 5\' 6"  (1.676 m)     Head Circumference      Peak Flow      Pain Score 0     Pain Loc      Pain Edu?      Excl. in GC?    Constitutional: Alert and oriented. Well appearing and in no acute distress.  Patient is talkative and cooperative. Eyes: Conjunctivae are normal.  Head: Atraumatic. Nose: No gross deformity and no soft tissue swelling.  Nasal bone is nontender to palpation.  No active bleeding is noted at this time.  There is evidence of bleeding from both naris with the right side being more involved. Mouth/Throat: Mucous membranes are moist.  Oropharynx non-erythematous.  No posterior bleeding noted. Neck: No stridor.   Cardiovascular: Normal rate, regular rhythm. Grossly normal heart sounds.  Good peripheral circulation. Respiratory: Normal respiratory effort.  No retractions. Lungs CTAB. Gastrointestinal: Soft and nontender. No distention. No abdominal bruits. No CVA tenderness. Neurologic:  Normal speech and language. No gross focal neurologic deficits are appreciated. No gait instability. Skin:  Skin is warm, dry and intact. No rash noted. Psychiatric: Mood and affect are normal. Speech and behavior are normal.  ____________________________________________   LABS (all labs ordered are listed,  but only abnormal results are displayed)  Labs Reviewed  COMPREHENSIVE METABOLIC PANEL - Abnormal; Notable for the following components:      Result Value   Potassium 2.8 (*)    Glucose, Bld 111 (*)    BUN 40 (*)    All other components within normal limits  CBC WITH DIFFERENTIAL/PLATELET  PROTIME-INR  APTT     PROCEDURES  Procedure(s) performed: None  Procedures  Critical Care performed: No  ____________________________________________   INITIAL IMPRESSION / ASSESSMENT AND PLAN / ED COURSE  As part of my medical decision making, I reviewed the following data within the electronic MEDICAL RECORD NUMBER Notes from prior ED visits and [place] Controlled Substance Database  Patient presents to the ED  via EMS with nosebleed that began at 8 PM.  Patient states that he fell hitting his nose on carpet due to ambulation issues since his stroke.  Patient currently has no active nosebleed and is ready to go home.  Physical exam shows that there is no bleed and that the right naris had an anterior bleed but there is no evidence of posterior bleed.  Patient is ambulatory without any assistance.  He was given instructions to hold pressure should he obtain another nosebleed.  Lab work shows that he is hypokalemic and he is aware of this but has not picked up his potassium at the pharmacy.  He was given 1 dose while in the ED.  He is encouraged to pick up his medication as soon as possible.  He is to follow-up with his PCP if any continued problems. ____________________________________________   FINAL CLINICAL IMPRESSION(S) / ED DIAGNOSES  Final diagnoses:  Right-sided nosebleed  Hypokalemia     ED Discharge Orders    None       Note:  This document was prepared using Dragon voice recognition software and may include unintentional dictation errors.    Tommi Rumps, PA-C 01/01/19 1440    Rockne Menghini, MD 01/02/19 1538

## 2019-01-01 NOTE — Telephone Encounter (Signed)
Attempted to contact the pt, no answer. LMOM to return my call.  

## 2019-01-03 ENCOUNTER — Emergency Department
Admission: EM | Admit: 2019-01-03 | Discharge: 2019-01-03 | Disposition: A | Discharge status: Home / Self Care | Payer: Medicare HMO | Attending: Emergency Medicine | Admitting: Emergency Medicine

## 2019-01-03 ENCOUNTER — Other Ambulatory Visit: Payer: Self-pay

## 2019-01-03 ENCOUNTER — Encounter: Payer: Self-pay | Admitting: Emergency Medicine

## 2019-01-03 DIAGNOSIS — Z79899 Other long term (current) drug therapy: Secondary | ICD-10-CM | POA: Diagnosis not present

## 2019-01-03 DIAGNOSIS — F17210 Nicotine dependence, cigarettes, uncomplicated: Secondary | ICD-10-CM | POA: Diagnosis not present

## 2019-01-03 DIAGNOSIS — G5602 Carpal tunnel syndrome, left upper limb: Secondary | ICD-10-CM | POA: Diagnosis not present

## 2019-01-03 DIAGNOSIS — M25532 Pain in left wrist: Secondary | ICD-10-CM | POA: Diagnosis present

## 2019-01-03 MED ORDER — PREDNISONE 10 MG PO TABS: 10.0000 mg | ORAL_TABLET | Freq: Every day | ORAL | 0 refills | Status: DC

## 2019-01-03 MED ORDER — PREDNISONE 20 MG PO TABS
60.0000 mg | ORAL_TABLET | Freq: Once | ORAL | Status: AC
  Administered 2019-01-03: 60 mg via ORAL
  Filled 2019-01-03: qty 3

## 2019-01-03 NOTE — ED Triage Notes (Signed)
Pt presents to ED with left hand pain. Left arm and hand affected from previous CVA last year. Pt states baseline pain in the affected hand daily is typically a 5/10 but the past several days it has been a 10/10 pain level. Unsure of cause. Pt denies falling or injury. States his normal pain medication is not helping.

## 2019-01-03 NOTE — ED Provider Notes (Signed)
[facility] Emergency Department Provider Note  ____________________________________________  Time seen: Approximately 10:49 PM  I have reviewed the triage vital signs and the nursing notes.   HISTORY  Chief Complaint Hand Pain    HPI Mark Wells is a 48 y.o. male who presents the emergency department complaining of left wrist pain.  Patient is well-known to myself in this department and has presented multiple times for pain complaints.  Patient does have a history of chronic back pain, sciatica, cervical radiculopathy.  Patient is on chronic pain management.  Patient is reporting that he has increased pain to the left wrist and numbness and tingling of the third, fourth, fifth digit of the left hand.  Patient does have a history of stroke with deficits to the left side.  Patient states that he has pain "all the time but it is worse."  No recent trauma or injury.  Patient denies any pain extending from the wrist proximally.  Patient is taking his normal pain medications but states that this does not alleviate his symptoms.    Past Medical History:  Diagnosis Date  . Anemia   . Anxiety   . Chronic back pain   . Depression   . Hemiplegia affecting left nondominant side (HCC)   . Hyperlipidemia   . Mood disorder (HCC)   . Personality disorder (HCC)   . Restless leg syndrome   . Sleep apnea   . Stroke [facility](HCC)     Patient Active Problem List   Diagnosis Date Noted  . OSA on CPAP 12/09/2018  . Chronic pain syndrome 12/09/2018  . Major depression, recurrent, chronic (HCC) 12/09/2018  . Insomnia 12/09/2018  . Spastic hemiplegia of left dominant side as late effect of cerebral infarction (HCC) 12/07/2018  . Painful orthopaedic hardware (HCC) 09/03/2018  . BACK PAIN 01/07/2011  . ADVERSE DRUG REACTION 01/01/2011  . Essential hypertension 12/28/2010  . LOW BACK PAIN SYNDROME, SEVERE 12/28/2010  . OTHER URINARY INCONTINENCE 12/28/2010    Past Surgical  History:  Procedure Laterality Date  . BACK SURGERY      Prior to Admission medications   Medication Sig Start Date End Date Taking? Authorizing Provider  amLODipine (NORVASC) 5 MG tablet Take 5 mg by mouth daily. 07/20/18   [provider]  atorvastatin (LIPITOR) 40 MG tablet Take 1 tablet (40 mg total) by mouth every evening. 12/07/18   Karamalegos, Netta NeatAlexander J, DO  chlorthalidone (HYGROTON) 25 MG tablet Take 25 mg by mouth daily. 07/20/18   [provider]  clopidogrel (PLAVIX) 75 MG tablet Take 1 tablet (75 mg total) by mouth daily. 12/07/18   Karamalegos, Netta NeatAlexander J, DO  losartan (COZAAR) 100 MG tablet Take 100 mg by mouth daily. 07/20/18   [provider]  meclizine (ANTIVERT) 25 MG tablet Take 1 tablet (25 mg total) by mouth 3 (three) times daily as needed for dizziness. 01/01/19   Karamalegos, Netta NeatAlexander J, DO  methocarbamol (ROBAXIN) 500 MG tablet Take 1 tablet (500 mg total) by mouth every 6 (six) hours as needed for muscle spasms. 12/07/18   Karamalegos, Netta NeatAlexander J, DO  ondansetron (ZOFRAN ODT) 4 MG disintegrating tablet Take 1 tablet (4 mg total) by mouth every 8 (eight) hours as needed for nausea or vomiting. 12/31/18   Merrily Brittleifenbark, Neil, MD  potassium chloride (K-DUR) 10 MEQ tablet Take 1 tablet (10 mEq total) by mouth 2 (two) times daily. 12/31/18   Merrily Brittleifenbark, Neil, MD  predniSONE (DELTASONE) 10 MG tablet Take 1 tablet (10 mg  total) by mouth daily. 01/03/19   Cuthriell, Delorise Royals, PA-C  pregabalin (LYRICA) 300 MG capsule Take 300 mg by mouth 2 (two) times daily.     [provider]    Allergies Aspirin; Ibuprofen; and Shellfish allergy  No family history on file.  Social History Social History   Tobacco Use  . Smoking status: Current Every Day Smoker    Packs/day: 0.50    Types: Cigarettes  . Smokeless tobacco: Never Used  Substance Use Topics  . Alcohol use: No  . Drug use: No     Review of Systems  Constitutional: No  fever/chills Eyes: No visual changes.  Cardiovascular: no chest pain. Respiratory: no cough. No SOB. Gastrointestinal: No abdominal pain.  No nausea, no vomiting.   Musculoskeletal: Positive for pain to the left wrist with numbness and tingling of the third, fourth, fifth digits. Skin: Negative for rash, abrasions, lacerations, ecchymosis. Neurological: Negative for headaches, focal weakness or numbness. 10-point ROS otherwise negative.  ____________________________________________   PHYSICAL EXAM:  VITAL SIGNS: ED Triage Vitals  Enc Vitals Group     BP 01/03/19 2146 127/90     Pulse Rate 01/03/19 2146 70     Resp 01/03/19 2146 20     Temp 01/03/19 2146 98.2 F (36.8 C)     Temp Source 01/03/19 2146 Oral     SpO2 01/03/19 2146 98 %     Weight 01/03/19 2147 190 lb (86.2 kg)     Height 01/03/19 2147 5\' 6"  (1.676 m)     Head Circumference --      Peak Flow --      Pain Score 01/03/19 2147 10     Pain Loc --      Pain Edu? --      Excl. in GC? --      Constitutional: Alert and oriented. Well appearing and in no acute distress. Eyes: Conjunctivae are normal. PERRL. EOMI. Head: Atraumatic. Neck: No stridor.  No cervical spine tenderness to palpation.  Cardiovascular: Normal rate, regular rhythm. Normal S1 and S2.  Good peripheral circulation. Respiratory: Normal respiratory effort without tachypnea or retractions. Lungs CTAB. Good air entry to the bases with no decreased or absent breath sounds. Musculoskeletal: Full range of motion to all extremities. No gross deformities appreciated.  Visualization of the left upper extremity reveals no visible abnormality.  Patient has no visible abnormality to the left wrist or hand.  Full range of motion to the wrist and all 5 digits left hand.  Patient has no tenderness to palpation of the osseous structures of the wrist, hand, digits.  Patient does have a positive Tinel's but negative Phalen's.  Sensation intact all 5 digits.  Capillary  refill less than 2 seconds all digits.  Radial pulse intact. Neurologic:  Normal speech and language. No gross focal neurologic deficits are appreciated.  Skin:  Skin is warm, dry and intact. No rash noted. Psychiatric: Mood and affect are normal. Speech and behavior are normal. Patient exhibits appropriate insight and judgement.   ____________________________________________   LABS (all labs ordered are listed, but only abnormal results are displayed)  Labs Reviewed - No data to display ____________________________________________  EKG   ____________________________________________  RADIOLOGY   No results found.  ____________________________________________    PROCEDURES  Procedure(s) performed:    .Splint Application Date/Time: 01/03/2019 11:03 PM Performed by: Racheal Patches, PA-C Authorized by: Racheal Patches, PA-C   Consent:    Consent obtained:  Verbal   Consent given  by:  Patient   Risks discussed:  Pain Pre-procedure details:    Sensation:  Normal Procedure details:    Laterality:  Left   Location:  Wrist   Wrist:  L wrist   Splint type:  Wrist   Supplies:  Prefabricated splint Post-procedure details:    Pain:  Unchanged   Sensation:  Normal   Patient tolerance of procedure:  Tolerated well, no immediate complications      Medications  predniSONE (DELTASONE) tablet 60 mg (60 mg Oral Given 01/03/19 2302)     ____________________________________________   INITIAL IMPRESSION / ASSESSMENT AND PLAN / ED COURSE  Pertinent labs & imaging results that were available during my care of the patient were reviewed by me and considered in my medical decision making (see chart for details).  Review of the [place] CSRS was performed in accordance of the [facility] prior to dispensing any controlled drugs.      Patient's diagnosis is consistent with carpal tunnel of the left wrist.  Patient presents emergency department complaining of numbness and  tingling to the third, fourth, fifth digit with pain to the left wrist.  Exam is reassuring.  No recent trauma.  Patient is given a Velcro wrist splint for symptom improvement.  Patient will be placed on course of steroids for symptom improvement as well.  Patient already has chronic pain medicine and is advised that he may take same.  No new prescription for narcotics will be provided..  Patient is given ED precautions to return to the ED for any worsening or new symptoms.     ____________________________________________  FINAL CLINICAL IMPRESSION(S) / ED DIAGNOSES  Final diagnoses:  Carpal tunnel syndrome of left wrist      NEW MEDICATIONS STARTED DURING THIS VISIT:  ED Discharge Orders         Ordered    predniSONE (DELTASONE) 10 MG tablet  Daily    Note to Pharmacy:  Take 6 pills x 2 days, 5 pills x 2 days, 4 pills x 2 days, 3 pills x 2 days, 2 pills x 2 days, and 1 pill x 2 days   01/03/19 2303              This chart was dictated using voice recognition software/Dragon. Despite best efforts to proofread, errors can occur which can change the meaning. Any change was purely unintentional.    Racheal Patches, PA-C 01/03/19 2305    Myrna Blazer, MD 01/03/19 2352

## 2019-01-04 NOTE — Telephone Encounter (Signed)
error 

## 2019-01-21 ENCOUNTER — Telehealth: Payer: Self-pay | Admitting: Family Medicine

## 2019-01-21 NOTE — Telephone Encounter (Signed)
Pt called said that he need you to help him find some where to live  (stroke) said that his parents was unable to help. Pt call back # is [phone]

## 2019-01-21 NOTE — Telephone Encounter (Signed)
Patient will need to seek out some community resources. I am not able to find him a place to live. But if he can find some assistance through one of these resources, they may request an application that he can complete to get assistance.  [facility] [facility] [employer] [facility] [address] [place], [place] Phone: [phone]  [facility] may be able to link him with appropriate housing resources as well. Or he can try the main office in [place].  [place] [facility] [address] [place], [place] [id number] Customer Service Center: [phone]  He should start with these locations.  Saralyn PilarAlexander Karamalegos, DO [facility] 01/21/2019, 3:21 PM

## 2019-01-22 NOTE — Telephone Encounter (Addendum)
notified patient as per Dr. Kirtland Bouchard he will like to stop by at office and pick up all these addresses and phone numbers.

## 2019-01-31 ENCOUNTER — Telehealth: Payer: Self-pay | Admitting: Family Medicine

## 2019-01-31 DIAGNOSIS — I69352 Hemiplegia and hemiparesis following cerebral infarction affecting left dominant side: Secondary | ICD-10-CM

## 2019-01-31 NOTE — Telephone Encounter (Addendum)
Advised patient to find out how they want PT referral to be submitted, he prefers [facility]. Patient called few minute later want referral [facility] and I think he [facility] instead of [facility].

## 2019-01-31 NOTE — Telephone Encounter (Signed)
Pt called requesting to do PT at the [facility]. Pt call back # is [phone]

## 2019-01-31 NOTE — Telephone Encounter (Signed)
That is fine. I have not ordered PT at that location before.  We would need to find out which [facility] location - and how to submit a referral. Most likely they either have a referral form or perhaps they would accept a handwritten rx pad order.  Could you find out more and let me know?  Thanks  Saralyn Pilar, DO [facility] 01/31/2019, 3:06 PM

## 2019-01-31 NOTE — Addendum Note (Signed)
Addended by: Smitty Cords on: 01/31/2019 03:38 PM   Modules accepted: Orders

## 2019-01-31 NOTE — Telephone Encounter (Addendum)
Referral signed to [facility].  Saralyn Pilar, DO [facility] 01/31/2019, 3:37 PM

## 2019-02-05 ENCOUNTER — Emergency Department: Payer: Medicare HMO

## 2019-02-05 ENCOUNTER — Emergency Department
Admission: EM | Admit: 2019-02-05 | Discharge: 2019-02-05 | Disposition: A | Discharge status: Home / Self Care | Payer: Medicare HMO | Attending: Emergency Medicine | Admitting: Emergency Medicine

## 2019-02-05 ENCOUNTER — Other Ambulatory Visit: Payer: Self-pay

## 2019-02-05 ENCOUNTER — Encounter: Payer: Self-pay | Admitting: *Deleted

## 2019-02-05 DIAGNOSIS — W010XXA Fall on same level from slipping, tripping and stumbling without subsequent striking against object, initial encounter: Secondary | ICD-10-CM | POA: Insufficient documentation

## 2019-02-05 DIAGNOSIS — Y92009 Unspecified place in unspecified non-institutional (private) residence as the place of occurrence of the external cause: Secondary | ICD-10-CM | POA: Insufficient documentation

## 2019-02-05 DIAGNOSIS — Z79899 Other long term (current) drug therapy: Secondary | ICD-10-CM | POA: Insufficient documentation

## 2019-02-05 DIAGNOSIS — I10 Essential (primary) hypertension: Secondary | ICD-10-CM | POA: Insufficient documentation

## 2019-02-05 DIAGNOSIS — Z7902 Long term (current) use of antithrombotics/antiplatelets: Secondary | ICD-10-CM | POA: Diagnosis not present

## 2019-02-05 DIAGNOSIS — S40012A Contusion of left shoulder, initial encounter: Secondary | ICD-10-CM | POA: Diagnosis not present

## 2019-02-05 DIAGNOSIS — F17210 Nicotine dependence, cigarettes, uncomplicated: Secondary | ICD-10-CM | POA: Diagnosis not present

## 2019-02-05 DIAGNOSIS — G894 Chronic pain syndrome: Secondary | ICD-10-CM | POA: Insufficient documentation

## 2019-02-05 DIAGNOSIS — S5002XA Contusion of left elbow, initial encounter: Secondary | ICD-10-CM | POA: Diagnosis not present

## 2019-02-05 DIAGNOSIS — Y999 Unspecified external cause status: Secondary | ICD-10-CM | POA: Diagnosis not present

## 2019-02-05 DIAGNOSIS — Y9301 Activity, walking, marching and hiking: Secondary | ICD-10-CM | POA: Diagnosis not present

## 2019-02-05 DIAGNOSIS — W19XXXA Unspecified fall, initial encounter: Secondary | ICD-10-CM

## 2019-02-05 DIAGNOSIS — S4992XA Unspecified injury of left shoulder and upper arm, initial encounter: Secondary | ICD-10-CM | POA: Diagnosis present

## 2019-02-05 MED ORDER — PREDNISONE 20 MG PO TABS
60.0000 mg | ORAL_TABLET | Freq: Once | ORAL | Status: AC
  Administered 2019-02-05: 60 mg via ORAL
  Filled 2019-02-05: qty 3

## 2019-02-05 MED ORDER — CYCLOBENZAPRINE HCL 10 MG PO TABS
10.0000 mg | ORAL_TABLET | Freq: Once | ORAL | Status: AC
  Administered 2019-02-05: 10 mg via ORAL
  Filled 2019-02-05: qty 1

## 2019-02-05 MED ORDER — PREDNISONE 20 MG PO TABS: 20.0000 mg | ORAL_TABLET | Freq: Two times a day (BID) | ORAL | 0 refills | Status: AC

## 2019-02-05 NOTE — ED Triage Notes (Signed)
Pt reports slipping and falling today. Pain in left shoulder and elbow. Pt reports, "I just want to get checked out you know." No deformity noted. No discoloration.

## 2019-02-05 NOTE — ED Provider Notes (Signed)
[facility] Emergency Department Provider Note ____________________________________________  Time seen: 1810  I have reviewed the triage vital signs and the nursing notes.  HISTORY  Chief Complaint  Fall  HPI Mark Wells is a 48 y.o. male presents to the ED via EMS from home, after a mechanical fall.  Patient who has a history of hemiplegia affecting the left side secondary to a stroke, describes slipping on a wet floor while walking with his walker.  He describes landing on the left side but denies any head injury, loss of consciousness, nausea, vomiting, or dizziness.  He reports his brother was present at the time of the injury, but was either unwilling or unable to help the patient get up.  He was picked up by EMS and presents here with request for evaluation of his left shoulder and elbow.  Past Medical History:  Diagnosis Date  . Anemia   . Anxiety   . Chronic back pain   . Depression   . Hemiplegia affecting left nondominant side (HCC)   . Hyperlipidemia   . Mood disorder (HCC)   . Personality disorder (HCC)   . Restless leg syndrome   . Sleep apnea   . Stroke [facility])     Patient Active Problem List   Diagnosis Date Noted  . OSA on CPAP 12/09/2018  . Chronic pain syndrome 12/09/2018  . Major depression, recurrent, chronic (HCC) 12/09/2018  . Insomnia 12/09/2018  . Spastic hemiplegia of left dominant side as late effect of cerebral infarction (HCC) 12/07/2018  . Painful orthopaedic hardware (HCC) 09/03/2018  . BACK PAIN 01/07/2011  . ADVERSE DRUG REACTION 01/01/2011  . Essential hypertension 12/28/2010  . LOW BACK PAIN SYNDROME, SEVERE 12/28/2010  . OTHER URINARY INCONTINENCE 12/28/2010    Past Surgical History:  Procedure Laterality Date  . BACK SURGERY      Prior to Admission medications   Medication Sig Start Date End Date Taking? Authorizing Provider  oxyCODONE-acetaminophen (PERCOCET) 7.5-325 MG tablet Take 1 tablet by mouth 3  (three) times daily as needed for severe pain.   Yes [provider]  amLODipine (NORVASC) 5 MG tablet Take 5 mg by mouth daily. 07/20/18   [provider]  atorvastatin (LIPITOR) 40 MG tablet Take 1 tablet (40 mg total) by mouth every evening. 12/07/18   Karamalegos, Netta Neat, DO  chlorthalidone (HYGROTON) 25 MG tablet Take 25 mg by mouth daily. 07/20/18   [provider]  clopidogrel (PLAVIX) 75 MG tablet Take 1 tablet (75 mg total) by mouth daily. 12/07/18   Karamalegos, Netta Neat, DO  losartan (COZAAR) 100 MG tablet Take 100 mg by mouth daily. 07/20/18   [provider]  meclizine (ANTIVERT) 25 MG tablet Take 1 tablet (25 mg total) by mouth 3 (three) times daily as needed for dizziness. 01/01/19   Karamalegos, Netta Neat, DO  methocarbamol (ROBAXIN) 500 MG tablet Take 1 tablet (500 mg total) by mouth every 6 (six) hours as needed for muscle spasms. 12/07/18   Karamalegos, Netta Neat, DO  potassium chloride (K-DUR) 10 MEQ tablet Take 1 tablet (10 mEq total) by mouth 2 (two) times daily. 12/31/18   Merrily Brittle, MD  predniSONE (DELTASONE) 20 MG tablet Take 1 tablet (20 mg total) by mouth 2 (two) times daily with a meal for 5 days. 02/05/19 02/10/19  Amika Tassin, Charlesetta Ivory, PA-C  pregabalin (LYRICA) 300 MG capsule Take 300 mg by mouth 2 (two) times daily.     [provider]  Allergies Aspirin; Ibuprofen; and Shellfish allergy  History reviewed. No pertinent family history.  Social History Social History   Tobacco Use  . Smoking status: Current Every Day Smoker    Packs/day: 0.50    Types: Cigarettes  . Smokeless tobacco: Never Used  Substance Use Topics  . Alcohol use: No  . Drug use: No    Review of Systems  Constitutional: Negative for fever. Eyes: Negative for visual changes. ENT: Negative for sore throat. Cardiovascular: Negative for chest pain. Respiratory: Negative for shortness of breath. Gastrointestinal: Negative for  abdominal pain, vomiting and diarrhea. Genitourinary: Negative for dysuria. Musculoskeletal: Negative for back pain.  Left shoulder and elbow pain as above. Skin: Negative for rash. Neurological: Negative for headaches, focal weakness or numbness. ____________________________________________  PHYSICAL EXAM:  VITAL SIGNS: ED Triage Vitals  Enc Vitals Group     BP 02/05/19 1500 126/90     Pulse Rate 02/05/19 1500 84     Resp 02/05/19 1500 18     Temp 02/05/19 1500 98.5 F (36.9 C)     Temp Source 02/05/19 1500 Oral     SpO2 02/05/19 1500 98 %     Weight 02/05/19 1501 200 lb (90.7 kg)     Height 02/05/19 1501 5\' 6"  (1.676 m)     Head Circumference --      Peak Flow --      Pain Score 02/05/19 1503 10     Pain Loc --      Pain Edu? --      Excl. in GC? --     Constitutional: Alert and oriented. Well appearing and in no distress. Head: Normocephalic and atraumatic. Eyes: Conjunctivae are normal. Normal extraocular movements Neck: Supple.  Normal range of motion without crepitus.  No distracting midline tenderness is noted. Cardiovascular: Normal rate, regular rhythm. Normal distal pulses. Respiratory: Normal respiratory effort. No wheezes/rales/rhonchi. Musculoskeletal: Left shoulder without obvious deformity or dislocation.  Patient is able to demonstrate active range of motion without difficulty.  He is limited extension to about 60 degrees at baseline.  Elbow exam is without effusion or deformity.  Patient is able to demonstrate normal elbow flexion extension range.  Normal composite fist distally.  Nontender with normal range of motion in all extremities.  Neurologic: Hemiplegic gait without ataxia. Normal speech and language. No gross focal neurologic deficits are appreciated. Skin:  Skin is warm, dry and intact. No rash noted. Psychiatric: Mood and affect are normal. Patient exhibits appropriate insight and judgment. ___________________________________________    RADIOLOGY  Left shoulder IMPRESSION: 1. No acute radiographic findings. If pain persists despite conservative therapy, MRI may be warranted for further characterization.  Left elbow IMPRESSION: 1. Faint linear calcification along the tip of the coronoid process could be from a small coronoid process avulsion, but there is no confirmatory joint effusion or bulging of the supinator fat pad and this could simply be from mild spurring. ____________________________________________  PROCEDURES  Procedures Flexeril 10 mg p.o. [name]. prednisone 60 mg p.o. ____________________________________________  INITIAL IMPRESSION / ASSESSMENT AND PLAN / ED COURSE  Patient with ED evaluation of left upper extremity pain following mechanical fall at home.  Patient clinical picture is reassuring as it shows no acute fracture, dislocation, or joint effusion on the left upper extremity.  X-rays are reassuring to the patient at this time.  He is discharged with a prescription for prednisone to take for pain relief in addition to his home medications that include Flexeril and Percocet.  He will  follow-up with primary provider or return to the ED as needed. ____________________________________________  FINAL CLINICAL IMPRESSION(S) / ED DIAGNOSES  Final diagnoses:  Fall in home, initial encounter  Contusion of left elbow, initial encounter  Contusion of left shoulder, initial encounter      Lissa HoardMenshew, Aleesha Ringstad V Bacon, PA-C 02/05/19 2313    Myrna BlazerSchaevitz, David Matthew, MD 02/05/19 2326

## 2019-02-05 NOTE — ED Notes (Signed)
See triage note  Presents s/p fall  States he slipped on concrete   Having some discomfort in left shoulder and elbow

## 2019-02-05 NOTE — Discharge Instructions (Addendum)
Your exam and x-rays are consistent with shoulder and elbow contusion. Take your home meds including your muscle relaxant for pain and spasms. Take the steroid as directed.

## 2019-02-05 NOTE — ED Notes (Signed)
Patient transported to X-ray 

## 2019-02-26 ENCOUNTER — Other Ambulatory Visit: Payer: Self-pay

## 2019-02-26 ENCOUNTER — Telehealth: Payer: Self-pay | Admitting: Family Medicine

## 2019-02-26 ENCOUNTER — Encounter: Payer: Self-pay | Admitting: Family Medicine

## 2019-02-26 ENCOUNTER — Ambulatory Visit (INDEPENDENT_AMBULATORY_CARE_PROVIDER_SITE_OTHER): Payer: Medicare HMO | Admitting: Family Medicine

## 2019-02-26 VITALS — BP 121/89 | HR 90 | Temp 98.8°F | Resp 16 | Ht 66.0 in

## 2019-02-26 DIAGNOSIS — M545 Low back pain: Secondary | ICD-10-CM

## 2019-02-26 DIAGNOSIS — E782 Mixed hyperlipidemia: Secondary | ICD-10-CM

## 2019-02-26 DIAGNOSIS — N39498 Other specified urinary incontinence: Secondary | ICD-10-CM

## 2019-02-26 DIAGNOSIS — G894 Chronic pain syndrome: Secondary | ICD-10-CM

## 2019-02-26 DIAGNOSIS — I6930 Unspecified sequelae of cerebral infarction: Secondary | ICD-10-CM

## 2019-02-26 DIAGNOSIS — I69352 Hemiplegia and hemiparesis following cerebral infarction affecting left dominant side: Secondary | ICD-10-CM | POA: Diagnosis not present

## 2019-02-26 DIAGNOSIS — G8929 Other chronic pain: Secondary | ICD-10-CM | POA: Diagnosis not present

## 2019-02-26 DIAGNOSIS — I10 Essential (primary) hypertension: Secondary | ICD-10-CM

## 2019-02-26 DIAGNOSIS — F339 Major depressive disorder, recurrent, unspecified: Secondary | ICD-10-CM | POA: Diagnosis not present

## 2019-02-26 DIAGNOSIS — R42 Dizziness and giddiness: Secondary | ICD-10-CM | POA: Diagnosis not present

## 2019-02-26 DIAGNOSIS — G4701 Insomnia due to medical condition: Secondary | ICD-10-CM

## 2019-02-26 DIAGNOSIS — T8484XA Pain due to internal orthopedic prosthetic devices, implants and grafts, initial encounter: Secondary | ICD-10-CM

## 2019-02-26 MED ORDER — POTASSIUM CHLORIDE ER 10 MEQ PO TBCR: 10.0000 meq | EXTENDED_RELEASE_TABLET | Freq: Two times a day (BID) | ORAL | 1 refills | Status: DC

## 2019-02-26 MED ORDER — METHOCARBAMOL 500 MG PO TABS: 500.0000 mg | ORAL_TABLET | Freq: Four times a day (QID) | ORAL | 2 refills | Status: DC | PRN

## 2019-02-26 MED ORDER — MECLIZINE HCL 25 MG PO TABS: 25.0000 mg | ORAL_TABLET | Freq: Three times a day (TID) | ORAL | 1 refills | Status: DC | PRN

## 2019-02-26 MED ORDER — CLOPIDOGREL BISULFATE 75 MG PO TABS: 75.0000 mg | ORAL_TABLET | Freq: Every day | ORAL | 1 refills | Status: DC

## 2019-02-26 MED ORDER — CHLORTHALIDONE 25 MG PO TABS: 25.0000 mg | ORAL_TABLET | Freq: Every day | ORAL | 1 refills | Status: DC

## 2019-02-26 MED ORDER — METHYLPREDNISOLONE ACETATE 40 MG/ML IJ SUSP
40.0000 mg | Freq: Once | INTRAMUSCULAR | Status: AC
  Administered 2019-02-26: 40 mg via INTRAMUSCULAR

## 2019-02-26 MED ORDER — ATORVASTATIN CALCIUM 40 MG PO TABS: 40.0000 mg | ORAL_TABLET | Freq: Every evening | ORAL | 1 refills | Status: DC

## 2019-02-26 MED ORDER — LOSARTAN POTASSIUM 100 MG PO TABS: 100.0000 mg | ORAL_TABLET | Freq: Every day | ORAL | 1 refills | Status: DC

## 2019-02-26 MED ORDER — AMLODIPINE BESYLATE 5 MG PO TABS: 5.0000 mg | ORAL_TABLET | Freq: Every day | ORAL | 1 refills | Status: DC

## 2019-02-26 NOTE — Progress Notes (Addendum)
Subjective:    Patient ID: Mark Wells, male    DOB: [birth year], 48 y.o.   MRN: [id number]  Mark Wells is a 48 y.o. male presenting on 02/26/2019 for Back Pain (FL2 paperwork) and Hemiplegia   HPI  Here today to complete FL2 evaluation and paperwork. - Of not he has limited transportation, living in shelter, and has limited resources to receive care in ambulatory setting.  Previously going to Ortho - now pain is unbearable - Limited transportation, he has stayed in shelter or hotel  CHRONIC HTN: Reports no new concerns. BP has been controlled. Due for refills today. Current Meds - Amlodipine  daily, Chlorthalidone  daily, Losartan  daily   Reports good compliance, took meds today. Tolerating well, w/o complaints. Denies CP, dyspnea, HA, edema, dizziness / lightheadedness  Chronic Pain Syndrome / Osteoarthritis Multiple Joints / Lumbar Spine DJD disc herniation L5 History of disc herniation 2006, he had up to 5 surgeries in 2 years, in [place] - Recent surgical history, he had a revision of back hardware by Dr Jordan Likes - 08/2018 - He follows up with [facility] Dr Wayland Salinas, monthly for injection therapy for pain. Also Followed by [facility] Neurology, Pain Management - Dortha Kern FNP, last rx Oxycodone-Acetaminophen 7.5/325mg  #90 on 11/29/18, and Lyrica  #60 on 11/06/18 - He has improved with PRN Prednisnoe as well, recently treated in December 2019 with steroid taper but had limited results. I have treated him with Robaxin muscle relaxant PRN with some relief, he needs refill today  Left sided Upper and Lower Extremity Hemiplegia / History of CVA w/ residual deficit - History of CVA in October 2018, he was in wheelchair afterward due to limited Left side upper and lower extremity hemiplegia. He works with Physical Therapy regularly at [facility]. He has intact sensation, but gradually improving motor strength. He uses walker or cane for mobility, but can only walk  short distances. - He is in need of more care assistance, he has difficulty with bathing, feeding, and dressing, and would benefit from additional ADL supportive services  PMH - OSA on CPAP  Hyperlipidemia On Atorvastatin daily.  Depression / Insomnia Prior history of mood disorder. He has not seen Psychiatry. Previously on Ambien and Klonopin, no longer on these medications. He admits his mood is related to his chronic debilitation from stroke and pain.   No flowsheet data found.  Social History   Tobacco Use  . Smoking status: Current Every Day Smoker    Packs/day: 0.50    Types: Cigarettes  . Smokeless tobacco: Current User  Substance Use Topics  . Alcohol use: No  . Drug use: No    Review of Systems Per HPI unless specifically indicated above     Objective:    BP 121/89   Pulse 90   Temp 98.8 F (37.1 C) (Oral)   Resp 16   Ht  (1.676 m)   BMI 32.28 kg/m   Wt Readings from Last 3 Encounters:  02/05/19 200 lb (90.7 kg)  01/03/19 190 lb (86.2 kg)  01/01/19 200 lb (90.7 kg)    Physical Exam Vitals signs and nursing note reviewed.  Constitutional:      General: He is not in acute distress.    Appearance: He is well-developed. He is not diaphoretic.     Comments: Chronically ill-appearing, L sided upper extremity limited movement with some pain, cooperative, wheelchair bound and has cane  HENT:     Head: Normocephalic and atraumatic.  Eyes:     General:        Right eye: No discharge.        Left eye: No discharge.     Conjunctiva/sclera: Conjunctivae normal.  Neck:     Musculoskeletal: Normal range of motion and neck supple.     Thyroid: No thyromegaly.  Cardiovascular:     Rate and Rhythm: Normal rate and regular rhythm.     Heart sounds: Normal heart sounds. No murmur.  Pulmonary:     Effort: Pulmonary effort is normal. No respiratory distress.     Breath sounds: Normal breath sounds. No wheezing or rales.  Musculoskeletal:     Comments:  Upper Extremity Inspection: Left upper ext is held at his side, with some spastic symptoms of shoulder and upper ext elbow ROM: limited abduction forward flex and internal rotation, has some mild range forward flex only, grip motion and elbow is reduced Strength: grip 3/5, forward flexion biceps strength 4/5, abduction triceps unable to test Neurovascular: distal intact on R, reduced on L  Lower Extremity Inspection: symmetrical Palpation: non tender ROM: R lower ext full ROM. L lower ext good ROM knee/hip flex ext, ankle dorsiplantar flex - only slightly reduced on Left Strength: strength mildly reduced to 4/5 on Left side, compared to R Neurovascular: sensation intact grossly distal  Lymphadenopathy:     Cervical: No cervical adenopathy.  Skin:    General: Skin is warm and dry.     Findings: No erythema or rash.  Neurological:     Mental Status: He is alert and oriented to person, place, and time.  Psychiatric:        Behavior: Behavior normal.     Comments: Well groomed, good eye contact, normal speech and thoughts    Results for orders placed or performed during the hospital encounter of 01/01/19  CBC with Differential  Result Value Ref Range   WBC 9.9 4.0 - 10.5 K/uL   RBC 4.37 4.22 - 5.81 MIL/uL   Hemoglobin 13.4 13.0 - 17.0 g/dL   HCT 54.6 27.0 - 35.0 %   MCV 92.7 80.0 - 100.0 fL   MCH 30.7 26.0 - 34.0 pg   MCHC 33.1 30.0 - 36.0 g/dL   RDW 09.3 81.8 - 29.9 %   Platelets 290 150 - 400 K/uL   nRBC 0.0 0.0 - 0.2 %   Neutrophils Relative % 57 %   Neutro Abs 5.6 1.7 - 7.7 K/uL   Lymphocytes Relative 33 %   Lymphs Abs 3.2 0.7 - 4.0 K/uL   Monocytes Relative 8 %   Monocytes Absolute 0.8 0.1 - 1.0 K/uL   Eosinophils Relative 1 %   Eosinophils Absolute 0.1 0.0 - 0.5 K/uL   Basophils Relative 1 %   Basophils Absolute 0.1 0.0 - 0.1 K/uL   Immature Granulocytes 0 %   Abs Immature Granulocytes 0.03 0.00 - 0.07 K/uL  Comprehensive metabolic panel  Result Value Ref Range    Sodium 137 135 - 145 mmol/L   Potassium 2.8 (L) 3.5 - 5.1 mmol/L   Chloride 98 98 - 111 mmol/L   CO2 29 22 - 32 mmol/L   Glucose, Bld 111 (H) 70 - 99 mg/dL   BUN 40 (H) 6 - 20 mg/dL   Creatinine, Ser 3.71 0.61 - 1.24 mg/dL   Calcium 8.9 8.9 - 69.6 mg/dL   Total Protein 7.0 6.5 - 8.1 g/dL   Albumin 3.8 3.5 - 5.0 g/dL   AST 37 15 - 41  U/L   ALT 20 0 - 44 U/L   Alkaline Phosphatase 69 38 - 126 U/L   Total Bilirubin 1.0 0.3 - 1.2 mg/dL   GFR calc non Af Amer >60 >60 mL/min   GFR calc Af Amer >60 >60 mL/min   Anion gap 10 5 - 15  Protime-INR  Result Value Ref Range   Prothrombin Time 14.0 11.4 - 15.2 seconds   INR 1.09   APTT  Result Value Ref Range   aPTT 28 24 - 36 seconds      Assessment & Plan:   Problem List Items Addressed This Visit    Chronic pain syndrome - Primary   Relevant Medications   methylPREDNISolone acetate (DEPO-MEDROL) injection 40 mg (Completed)   Essential hypertension   Relevant Medications   losartan (COZAAR) 100 MG tablet   chlorthalidone (HYGROTON) 25 MG tablet   amLODipine (NORVASC) 5 MG tablet   potassium chloride (K-DUR) 10 MEQ tablet   atorvastatin (LIPITOR) 40 MG tablet   Insomnia   LOW BACK PAIN SYNDROME, SEVERE   Relevant Medications   methylPREDNISolone acetate (DEPO-MEDROL) injection 40 mg (Completed)   methocarbamol (ROBAXIN) 500 MG tablet   Major depression, recurrent, chronic (HCC)   OTHER URINARY INCONTINENCE   Painful orthopaedic hardware (HCC)   Spastic hemiplegia of left dominant side as late effect of cerebral infarction (HCC)   Relevant Medications   methocarbamol (ROBAXIN) 500 MG tablet    Other Visit Diagnoses    Dizziness       Relevant Medications   meclizine (ANTIVERT) 25 MG tablet   History of cerebrovascular accident (CVA) with residual deficit       Relevant Medications   atorvastatin (LIPITOR) 40 MG tablet   clopidogrel (PLAVIX) 75 MG tablet   Mixed hyperlipidemia       Relevant Medications   losartan  (COZAAR) 100 MG tablet   chlorthalidone (HYGROTON) 25 MG tablet   amLODipine (NORVASC) 5 MG tablet   atorvastatin (LIPITOR) 40 MG tablet      Clinically with constellation of chronic medical problems related to chronic pain and weakness, underlying Chronic Residual Left sided upper extremity hemiplegia secondary to CVA/Stroke, and underlying Osteoarthritis degenerative disease multiple joints and Lumbar Spine. - He is debilitated by chronic pain and limited mobility - Followed by [facility] for pain management, and has been treated by Physical Therapy at [facility], in setting of post stroke hemiparesis  Additionally - chronic depression, has not been adequately treated in past, has declined anti depressant, failed some in past. No longer with Psychiatry, we have attempted to get him established with [facility] or [facility] and awaiting further update from these options.  Plan Today he will receive a steroid depo medrol IM dose for acute pain  - Continue current med management, as PCP I am prescribing his Robaxin muscle relaxant, and other routine medications for blood pressure, cholesterol, stroke prevention. All medications refilled. - His pain medication is managed by [facility] - He is to continue to follow-up with these specialists for these medications, opiate pain management and other therapy, as I am not providing chronic opiates or controlled substance for this patient.  Given his physical debilitation, he would benefit from evaluation and likely admission to Skilled Nursing Facility, and anticipate he may benefit from Long Term Care due to his chronic conditions. - FL2 completed today. We will plan to fax to [facility] as requested for admission evaluation  Patient would benefit from a fixed height hospital bed. This note is to serve as documentation to demonstrate medical necessity for this  hospital bed. He has a medical condition as detailed above with Spastic Hemiplegia of Left side upper and lower extremity following prior stroke (cerebrovascular accident) as a residual deficit leaving him with chronic weakness and pain and limiting his function and mobility. Based on his medical condition as outlined above, he would benefit from a hospital bed to provide elevation of his upper body >30 degrees in ways not feasible with an ordinary bed.    Current Outpatient Medications:  .  amLODipine (NORVASC) 5 MG tablet, Take 1 tablet (5 mg total) by mouth daily., Disp: 90 tablet, Rfl: 1 .  atorvastatin (LIPITOR) 40 MG tablet, Take 1 tablet (40 mg total) by mouth every evening., Disp: 90 tablet, Rfl: 1 .  chlorthalidone (HYGROTON) 25 MG tablet, Take 1 tablet (25 mg total) by mouth daily., Disp: 90 tablet, Rfl: 1 .  clopidogrel (PLAVIX) 75 MG tablet, Take 1 tablet (75 mg total) by mouth daily., Disp: 90 tablet, Rfl: 1 .  losartan (COZAAR) 100 MG tablet, Take 1 tablet (100 mg total) by mouth daily., Disp: 90 tablet, Rfl: 1 .  meclizine (ANTIVERT) 25 MG tablet, Take 1 tablet (25 mg total) by mouth 3 (three) times daily as needed for dizziness., Disp: 90 tablet, Rfl: 1 .  methocarbamol (ROBAXIN) 500 MG tablet, Take 1 tablet (500 mg total) by mouth every 6 (six) hours as needed for muscle spasms., Disp: 120 tablet, Rfl: 2 .  oxyCODONE-acetaminophen (PERCOCET) 7.5-325 MG tablet, Take 1 tablet by mouth 3 (three) times daily as needed for severe pain., Disp: , Rfl:  .  potassium chloride (K-DUR) 10 MEQ tablet, Take 1 tablet (10 mEq total) by mouth 2 (two) times daily., Disp: 180 tablet, Rfl: 1 .  pregabalin (LYRICA) 300 MG capsule, Take 300 mg by mouth 2 (two) times daily. , Disp: , Rfl:     Follow up plan: Return in about 3 months (around 05/29/2019) for re-schedule next apt 3/13 to 3 months.   Saralyn Pilar, DO [facility] 02/26/2019, 11:18 AM

## 2019-02-26 NOTE — Patient Instructions (Addendum)
Thank you for coming to the office today.  Refilled all meds  Steroid injection today for pain.  Refilled muscle relaxant Robaxin  Please check with [facility] - Dr Wayland Salinas for pain medication and treatment, can discuss lyrica with her if need adjusted dose.  We will fax paperwork directly to [facility] - stay tuned from them next week if you need either Short Term Rehab up to 4 weeks or Long Term Care - to be determined.  Please schedule a Follow-up Appointment to: Return in about 3 months (around 05/29/2019) for re-schedule next apt 3/13 to 3 months.  If you have any other questions or concerns, please feel free to call the office or send a message through MyChart. You may also schedule an earlier appointment if necessary.  Additionally, you may be receiving a survey about your experience at our office within a few days to 1 week by e-mail or mail. We value your feedback.  Saralyn Pilar, DO [facility], [place]

## 2019-02-26 NOTE — Telephone Encounter (Signed)
I called [facility] for learning about options for this patient to admit him to either SNF short term rehab or Long Term Care.  She said with his new [facility], she would need a copy of his FL2 (will complete this later this week) and also any clinical office visit notes, we can print copy of note from 11/2018 from me and from 02/26/19 today, may be able to provide other notes including any documented physical therapy he has had as well showing he has a need for SNF.  Once approved by [facility], he would be able to be admitted and do an intake apt and they may determine if he needs Long Term Care, and proceed with that process once he is there.  Fax all documents requested to [place] at fax [phone] (admissions)  ---------------------------------  Could you locate a copy of his new insurance card? It should be [employer], I do not see it on file, my screen still says Monia Pouch, but he has recently just changed insurance. This new card would be required to proceed with nursing home.  Saralyn Pilar, DO [facility] 02/26/2019, 12:19 PM

## 2019-02-26 NOTE — Telephone Encounter (Signed)
Patient asked [place] after visit to change a med from brand name to generic. He did not know which one. I ordered all of his routine medications today for refill.  I called his pharmacy and confirmed they are referencing his Oxycodone pain medicine - he is still prescribed pain medication from a Dortha Kern (NP) at [facility] - he is the prescriber, patient needs to contact them if needs this rx changed.  It should be oxycodone, not brand Percocet.  I am not prescribing his oxycodone or pain medication.  [facility], [place] Address: [address], [place], [place] [address] Phone: [phone]  Saralyn Pilar, DO [facility] 02/26/2019, 12:03 PM

## 2019-02-26 NOTE — Telephone Encounter (Signed)
Left message for patient to call back  

## 2019-02-27 ENCOUNTER — Encounter: Payer: Self-pay | Admitting: Family Medicine

## 2019-02-27 NOTE — Telephone Encounter (Signed)
Ready to be Faxed FL2 and office visit note / PT notes insurance information to [facility] - See # below.  Anticipate fax on 02/28/19  Copy to be scanned to chart.  Saralyn Pilar, DO [facility] 02/27/2019, 5:06 PM

## 2019-02-27 NOTE — Telephone Encounter (Signed)
Left message

## 2019-02-27 NOTE — Progress Notes (Signed)
FORM COMPLETION - FL2 (Adult Care Home)  Indication: New FL2 - admit Initial start date of services / admission: TBD Date of completed form: 02/27/19 Facility - [facility] Contact: Dario Guardian (admissions) fax [phone]  Date of last office visits: 02/26/19  Level of Care requested - SNF Recommendation that patient be evaluated for possible Long Term Care - if eligible, or if indicated after initial SNF stay  Admitting Diagnosis / Medical Diagnosis List - ICD10 1. Spastic Hemiplegia of Left side, due to stroke - onset 09/2017 2. History of CVA / Stroke with residual deficit - onset 09/2017 3. Chronic Pain Syndrome / Chronic Back Pain 4. Osteoarthritis, multiple joints 5. Insomnia 6. Urinary Incontinence 7. Major Depression, recurrent 8. Hypertension  Patient Information: - Oriented  - Semi-ambulatory, uses cane short distances - Incontinence (bladder), and continent (bowels) - Inappropriate Behavior (None) - Functional Limitations (None) - Communication of needs: Verbal  - Respiration: Normal - Personal Care Assistance - Total Care (bathing, feeding, dressing) - Skin Normal - Nutrition: Regular diet  Routine vitals / weight.  Medication List: - Amlodipine 5mg  PO daily - Atorvastatin 40mg  PO nightly - Chlorthalidone 25mg  PO daily - Clopidogrel 75mg  PO daily - Losartan 100mg  PO daily - Meclizine 25mg  TID PRN dizziness - Methocarbamol 500mg  q 6 hr PRN muscle spasm and pain - Oxycodone 7.5/325mg  q 8 hr PRN pain (written by [facility], Dortha Kern FNP) - Pregabalin (Lyrica) 300mg  capsule BID (written by [facility] Dr Krista Blue) - Potassium Chloride BID PO  All medications refilled - except controlled medications, oxycodone and pregabalin, patient advised to seek refills order from his pain management / neurology  Forms completed, signed, dated and copies of last office visit note, insurance information, and [facility] records  to be faxed to [employer] tomorrow 02/28/19  Saralyn Pilar, DO [facility] 02/27/2019, 5:12 PM

## 2019-02-27 NOTE — Telephone Encounter (Signed)
We need a copy of his previous Physical Therapy documentation - he was seen at [facility] previously. I am not sure which location. Could you contact one of them to see if they can fax [place] a copy of his last 2 or 3 PT notes?  Saralyn Pilar, DO [facility] 02/27/2019, 8:18 AM

## 2019-02-28 NOTE — Telephone Encounter (Signed)
Left another message for patient if he calls back we will relay message.

## 2019-02-28 NOTE — Telephone Encounter (Signed)
Paperwork faxed to Dario Guardian [phone].

## 2019-03-04 ENCOUNTER — Telehealth: Payer: Self-pay | Admitting: Family Medicine

## 2019-03-04 NOTE — Telephone Encounter (Signed)
See prior notes and FL2.  I called [employer] peer to peer, spoke with Dr Romelle Starcher and they advised me that they did not have any clinicals or information from PT demonstrating prior function. However we faxed records of PT from [facility] with information to [facility], unfortunately those records did not make it to [facility].  I verbally reviewed the case with this provider in peer to peer, and was able to review last documented PT in 10/2018 in Epic Chart. And patient overall has historically been using walker and max assist for gait, short distances, and limited transfers due to L hemiplegia weakness, he can ambulate with walker short distances only.  Based on this information, patient does not qualify for daily skilled nursing therapy PT at SNF under [facility]. He would need a significant decline in function or other criteria.  They recommend intermittent PT for him instead, in ambulatory setting.  I called Cierra back at [employer] and spoke with her briefly, I relayed this information. She understands and said that she is actively trying to get him admitted through his Medicaid instead, and will await further decision from management with regards to this plan.  Stay tuned.  Saralyn Pilar, DO [facility] 03/04/2019, 10:43 AM

## 2019-03-04 NOTE — Telephone Encounter (Signed)
Rec'd a call from [place] from [facility] [phone] Ext 234, [place] that [employer] called  Friday after 5 said that Dr Serina Cowper to call  Francine Graven [phone] Opt [phone]  To do a pier to [employer] before 12  today

## 2019-03-07 DIAGNOSIS — G894 Chronic pain syndrome: Secondary | ICD-10-CM | POA: Diagnosis not present

## 2019-03-07 DIAGNOSIS — I69354 Hemiplegia and hemiparesis following cerebral infarction affecting left non-dominant side: Secondary | ICD-10-CM | POA: Diagnosis not present

## 2019-03-07 DIAGNOSIS — I10 Essential (primary) hypertension: Secondary | ICD-10-CM | POA: Diagnosis not present

## 2019-03-07 DIAGNOSIS — R2689 Other abnormalities of gait and mobility: Secondary | ICD-10-CM | POA: Diagnosis not present

## 2019-03-07 DIAGNOSIS — K5900 Constipation, unspecified: Secondary | ICD-10-CM | POA: Diagnosis not present

## 2019-03-07 DIAGNOSIS — I6330 Cerebral infarction due to thrombosis of unspecified cerebral artery: Secondary | ICD-10-CM | POA: Diagnosis not present

## 2019-03-07 DIAGNOSIS — M6281 Muscle weakness (generalized): Secondary | ICD-10-CM | POA: Diagnosis not present

## 2019-03-07 DIAGNOSIS — G8928 Other chronic postprocedural pain: Secondary | ICD-10-CM | POA: Diagnosis not present

## 2019-03-07 DIAGNOSIS — F329 Major depressive disorder, single episode, unspecified: Secondary | ICD-10-CM | POA: Diagnosis not present

## 2019-03-07 DIAGNOSIS — R262 Difficulty in walking, not elsewhere classified: Secondary | ICD-10-CM | POA: Diagnosis not present

## 2019-03-07 DIAGNOSIS — T8484XD Pain due to internal orthopedic prosthetic devices, implants and grafts, subsequent encounter: Secondary | ICD-10-CM | POA: Diagnosis not present

## 2019-03-07 DIAGNOSIS — E785 Hyperlipidemia, unspecified: Secondary | ICD-10-CM | POA: Diagnosis not present

## 2019-03-07 DIAGNOSIS — I69359 Hemiplegia and hemiparesis following cerebral infarction affecting unspecified side: Secondary | ICD-10-CM | POA: Diagnosis not present

## 2019-03-07 DIAGNOSIS — G8929 Other chronic pain: Secondary | ICD-10-CM | POA: Diagnosis not present

## 2019-03-07 DIAGNOSIS — M25522 Pain in left elbow: Secondary | ICD-10-CM | POA: Diagnosis not present

## 2019-03-08 ENCOUNTER — Ambulatory Visit: Payer: Medicare HMO | Admitting: Family Medicine

## 2019-03-11 DIAGNOSIS — K5900 Constipation, unspecified: Secondary | ICD-10-CM | POA: Diagnosis not present

## 2019-03-11 DIAGNOSIS — F329 Major depressive disorder, single episode, unspecified: Secondary | ICD-10-CM | POA: Diagnosis not present

## 2019-03-11 DIAGNOSIS — G8929 Other chronic pain: Secondary | ICD-10-CM | POA: Diagnosis not present

## 2019-03-11 DIAGNOSIS — I69359 Hemiplegia and hemiparesis following cerebral infarction affecting unspecified side: Secondary | ICD-10-CM | POA: Diagnosis not present

## 2019-03-11 DIAGNOSIS — I10 Essential (primary) hypertension: Secondary | ICD-10-CM | POA: Diagnosis not present

## 2019-03-11 DIAGNOSIS — E785 Hyperlipidemia, unspecified: Secondary | ICD-10-CM | POA: Diagnosis not present

## 2019-03-26 ENCOUNTER — Ambulatory Visit: Payer: Self-pay | Admitting: Physical Therapy

## 2019-03-28 ENCOUNTER — Ambulatory Visit: Payer: Self-pay | Admitting: Physical Therapy

## 2019-03-28 DIAGNOSIS — M1990 Unspecified osteoarthritis, unspecified site: Secondary | ICD-10-CM | POA: Diagnosis not present

## 2019-03-28 DIAGNOSIS — G4733 Obstructive sleep apnea (adult) (pediatric): Secondary | ICD-10-CM | POA: Diagnosis not present

## 2019-03-28 DIAGNOSIS — I10 Essential (primary) hypertension: Secondary | ICD-10-CM | POA: Diagnosis not present

## 2019-03-28 DIAGNOSIS — I69354 Hemiplegia and hemiparesis following cerebral infarction affecting left non-dominant side: Secondary | ICD-10-CM | POA: Diagnosis not present

## 2019-03-28 DIAGNOSIS — F338 Other recurrent depressive disorders: Secondary | ICD-10-CM | POA: Diagnosis not present

## 2019-03-28 DIAGNOSIS — M5136 Other intervertebral disc degeneration, lumbar region: Secondary | ICD-10-CM | POA: Diagnosis not present

## 2019-03-28 DIAGNOSIS — M5106 Intervertebral disc disorders with myelopathy, lumbar region: Secondary | ICD-10-CM | POA: Diagnosis not present

## 2019-03-28 DIAGNOSIS — G894 Chronic pain syndrome: Secondary | ICD-10-CM | POA: Diagnosis not present

## 2019-03-28 DIAGNOSIS — R498 Other voice and resonance disorders: Secondary | ICD-10-CM | POA: Diagnosis not present

## 2019-03-29 DIAGNOSIS — M1990 Unspecified osteoarthritis, unspecified site: Secondary | ICD-10-CM | POA: Diagnosis not present

## 2019-03-29 DIAGNOSIS — I69354 Hemiplegia and hemiparesis following cerebral infarction affecting left non-dominant side: Secondary | ICD-10-CM | POA: Diagnosis not present

## 2019-03-29 DIAGNOSIS — G894 Chronic pain syndrome: Secondary | ICD-10-CM | POA: Diagnosis not present

## 2019-03-29 DIAGNOSIS — R498 Other voice and resonance disorders: Secondary | ICD-10-CM | POA: Diagnosis not present

## 2019-03-29 DIAGNOSIS — M5106 Intervertebral disc disorders with myelopathy, lumbar region: Secondary | ICD-10-CM | POA: Diagnosis not present

## 2019-03-29 DIAGNOSIS — G4733 Obstructive sleep apnea (adult) (pediatric): Secondary | ICD-10-CM | POA: Diagnosis not present

## 2019-03-29 DIAGNOSIS — M5136 Other intervertebral disc degeneration, lumbar region: Secondary | ICD-10-CM | POA: Diagnosis not present

## 2019-03-29 DIAGNOSIS — F338 Other recurrent depressive disorders: Secondary | ICD-10-CM | POA: Diagnosis not present

## 2019-03-29 DIAGNOSIS — I10 Essential (primary) hypertension: Secondary | ICD-10-CM | POA: Diagnosis not present

## 2019-03-31 DIAGNOSIS — G894 Chronic pain syndrome: Secondary | ICD-10-CM | POA: Diagnosis not present

## 2019-03-31 DIAGNOSIS — M5126 Other intervertebral disc displacement, lumbar region: Secondary | ICD-10-CM | POA: Diagnosis not present

## 2019-03-31 DIAGNOSIS — I10 Essential (primary) hypertension: Secondary | ICD-10-CM | POA: Diagnosis not present

## 2019-03-31 DIAGNOSIS — I69354 Hemiplegia and hemiparesis following cerebral infarction affecting left non-dominant side: Secondary | ICD-10-CM | POA: Diagnosis not present

## 2019-03-31 DIAGNOSIS — M47816 Spondylosis without myelopathy or radiculopathy, lumbar region: Secondary | ICD-10-CM | POA: Diagnosis not present

## 2019-04-01 DIAGNOSIS — G894 Chronic pain syndrome: Secondary | ICD-10-CM | POA: Diagnosis not present

## 2019-04-01 DIAGNOSIS — G4733 Obstructive sleep apnea (adult) (pediatric): Secondary | ICD-10-CM | POA: Diagnosis not present

## 2019-04-01 DIAGNOSIS — F338 Other recurrent depressive disorders: Secondary | ICD-10-CM | POA: Diagnosis not present

## 2019-04-01 DIAGNOSIS — M1990 Unspecified osteoarthritis, unspecified site: Secondary | ICD-10-CM | POA: Diagnosis not present

## 2019-04-01 DIAGNOSIS — R498 Other voice and resonance disorders: Secondary | ICD-10-CM | POA: Diagnosis not present

## 2019-04-01 DIAGNOSIS — M5106 Intervertebral disc disorders with myelopathy, lumbar region: Secondary | ICD-10-CM | POA: Diagnosis not present

## 2019-04-01 DIAGNOSIS — I69354 Hemiplegia and hemiparesis following cerebral infarction affecting left non-dominant side: Secondary | ICD-10-CM | POA: Diagnosis not present

## 2019-04-01 DIAGNOSIS — M5136 Other intervertebral disc degeneration, lumbar region: Secondary | ICD-10-CM | POA: Diagnosis not present

## 2019-04-01 DIAGNOSIS — I10 Essential (primary) hypertension: Secondary | ICD-10-CM | POA: Diagnosis not present

## 2019-04-02 ENCOUNTER — Ambulatory Visit: Payer: Self-pay | Admitting: Physical Therapy

## 2019-04-03 DIAGNOSIS — I69354 Hemiplegia and hemiparesis following cerebral infarction affecting left non-dominant side: Secondary | ICD-10-CM | POA: Diagnosis not present

## 2019-04-03 DIAGNOSIS — M1990 Unspecified osteoarthritis, unspecified site: Secondary | ICD-10-CM | POA: Diagnosis not present

## 2019-04-03 DIAGNOSIS — G894 Chronic pain syndrome: Secondary | ICD-10-CM | POA: Diagnosis not present

## 2019-04-03 DIAGNOSIS — G4733 Obstructive sleep apnea (adult) (pediatric): Secondary | ICD-10-CM | POA: Diagnosis not present

## 2019-04-03 DIAGNOSIS — F338 Other recurrent depressive disorders: Secondary | ICD-10-CM | POA: Diagnosis not present

## 2019-04-03 DIAGNOSIS — M5106 Intervertebral disc disorders with myelopathy, lumbar region: Secondary | ICD-10-CM | POA: Diagnosis not present

## 2019-04-03 DIAGNOSIS — M5136 Other intervertebral disc degeneration, lumbar region: Secondary | ICD-10-CM | POA: Diagnosis not present

## 2019-04-03 DIAGNOSIS — R498 Other voice and resonance disorders: Secondary | ICD-10-CM | POA: Diagnosis not present

## 2019-04-03 DIAGNOSIS — I10 Essential (primary) hypertension: Secondary | ICD-10-CM | POA: Diagnosis not present

## 2019-04-04 ENCOUNTER — Ambulatory Visit: Payer: Self-pay | Admitting: Physical Therapy

## 2019-04-05 DIAGNOSIS — M1990 Unspecified osteoarthritis, unspecified site: Secondary | ICD-10-CM | POA: Diagnosis not present

## 2019-04-05 DIAGNOSIS — I69354 Hemiplegia and hemiparesis following cerebral infarction affecting left non-dominant side: Secondary | ICD-10-CM | POA: Diagnosis not present

## 2019-04-05 DIAGNOSIS — R498 Other voice and resonance disorders: Secondary | ICD-10-CM | POA: Diagnosis not present

## 2019-04-05 DIAGNOSIS — M5106 Intervertebral disc disorders with myelopathy, lumbar region: Secondary | ICD-10-CM | POA: Diagnosis not present

## 2019-04-05 DIAGNOSIS — I10 Essential (primary) hypertension: Secondary | ICD-10-CM | POA: Diagnosis not present

## 2019-04-05 DIAGNOSIS — G4733 Obstructive sleep apnea (adult) (pediatric): Secondary | ICD-10-CM | POA: Diagnosis not present

## 2019-04-05 DIAGNOSIS — G894 Chronic pain syndrome: Secondary | ICD-10-CM | POA: Diagnosis not present

## 2019-04-05 DIAGNOSIS — F338 Other recurrent depressive disorders: Secondary | ICD-10-CM | POA: Diagnosis not present

## 2019-04-05 DIAGNOSIS — M5136 Other intervertebral disc degeneration, lumbar region: Secondary | ICD-10-CM | POA: Diagnosis not present

## 2019-04-06 DIAGNOSIS — G894 Chronic pain syndrome: Secondary | ICD-10-CM | POA: Diagnosis not present

## 2019-04-06 DIAGNOSIS — M5126 Other intervertebral disc displacement, lumbar region: Secondary | ICD-10-CM | POA: Diagnosis not present

## 2019-04-06 DIAGNOSIS — I69354 Hemiplegia and hemiparesis following cerebral infarction affecting left non-dominant side: Secondary | ICD-10-CM | POA: Diagnosis not present

## 2019-04-06 DIAGNOSIS — I10 Essential (primary) hypertension: Secondary | ICD-10-CM | POA: Diagnosis not present

## 2019-04-06 DIAGNOSIS — M47816 Spondylosis without myelopathy or radiculopathy, lumbar region: Secondary | ICD-10-CM | POA: Diagnosis not present

## 2019-04-09 ENCOUNTER — Ambulatory Visit: Payer: Self-pay | Admitting: Physical Therapy

## 2019-04-09 ENCOUNTER — Telehealth: Payer: Self-pay | Admitting: Family Medicine

## 2019-04-09 DIAGNOSIS — I10 Essential (primary) hypertension: Secondary | ICD-10-CM | POA: Diagnosis not present

## 2019-04-09 DIAGNOSIS — M47816 Spondylosis without myelopathy or radiculopathy, lumbar region: Secondary | ICD-10-CM | POA: Diagnosis not present

## 2019-04-09 DIAGNOSIS — I69354 Hemiplegia and hemiparesis following cerebral infarction affecting left non-dominant side: Secondary | ICD-10-CM | POA: Diagnosis not present

## 2019-04-09 DIAGNOSIS — G894 Chronic pain syndrome: Secondary | ICD-10-CM | POA: Diagnosis not present

## 2019-04-09 DIAGNOSIS — Z7902 Long term (current) use of antithrombotics/antiplatelets: Secondary | ICD-10-CM | POA: Diagnosis not present

## 2019-04-09 DIAGNOSIS — E785 Hyperlipidemia, unspecified: Secondary | ICD-10-CM | POA: Diagnosis not present

## 2019-04-09 DIAGNOSIS — M5126 Other intervertebral disc displacement, lumbar region: Secondary | ICD-10-CM | POA: Diagnosis not present

## 2019-04-09 DIAGNOSIS — F17210 Nicotine dependence, cigarettes, uncomplicated: Secondary | ICD-10-CM | POA: Diagnosis not present

## 2019-04-09 NOTE — Telephone Encounter (Signed)
[facility]  Called request order for a shower Chair  Fax to  [phone]

## 2019-04-09 NOTE — Telephone Encounter (Signed)
Handwritten rx order, signed, dx spastic hemiplegia of L side due to CVA (ICD: Z32.992)  To be faxed  Saralyn Pilar, DO [facility] 04/09/2019, 2:11 PM

## 2019-04-10 DIAGNOSIS — I69352 Hemiplegia and hemiparesis following cerebral infarction affecting left dominant side: Secondary | ICD-10-CM | POA: Diagnosis not present

## 2019-04-11 ENCOUNTER — Ambulatory Visit: Payer: Self-pay | Admitting: Physical Therapy

## 2019-04-11 DIAGNOSIS — G894 Chronic pain syndrome: Secondary | ICD-10-CM | POA: Diagnosis not present

## 2019-04-11 DIAGNOSIS — M1990 Unspecified osteoarthritis, unspecified site: Secondary | ICD-10-CM | POA: Diagnosis not present

## 2019-04-11 DIAGNOSIS — F338 Other recurrent depressive disorders: Secondary | ICD-10-CM | POA: Diagnosis not present

## 2019-04-11 DIAGNOSIS — G4733 Obstructive sleep apnea (adult) (pediatric): Secondary | ICD-10-CM | POA: Diagnosis not present

## 2019-04-11 DIAGNOSIS — M5136 Other intervertebral disc degeneration, lumbar region: Secondary | ICD-10-CM | POA: Diagnosis not present

## 2019-04-11 DIAGNOSIS — R498 Other voice and resonance disorders: Secondary | ICD-10-CM | POA: Diagnosis not present

## 2019-04-11 DIAGNOSIS — I10 Essential (primary) hypertension: Secondary | ICD-10-CM | POA: Diagnosis not present

## 2019-04-11 DIAGNOSIS — M5106 Intervertebral disc disorders with myelopathy, lumbar region: Secondary | ICD-10-CM | POA: Diagnosis not present

## 2019-04-11 DIAGNOSIS — I69354 Hemiplegia and hemiparesis following cerebral infarction affecting left non-dominant side: Secondary | ICD-10-CM | POA: Diagnosis not present

## 2019-04-12 ENCOUNTER — Emergency Department: Payer: Medicare HMO

## 2019-04-12 ENCOUNTER — Other Ambulatory Visit: Payer: Self-pay

## 2019-04-12 ENCOUNTER — Emergency Department
Admission: EM | Admit: 2019-04-12 | Discharge: 2019-04-12 | Disposition: A | Discharge status: Home / Self Care | Payer: Medicare HMO | Attending: Emergency Medicine | Admitting: Emergency Medicine

## 2019-04-12 ENCOUNTER — Encounter: Payer: Self-pay | Admitting: *Deleted

## 2019-04-12 DIAGNOSIS — Y92008 Other place in unspecified non-institutional (private) residence as the place of occurrence of the external cause: Secondary | ICD-10-CM | POA: Diagnosis not present

## 2019-04-12 DIAGNOSIS — Z79899 Other long term (current) drug therapy: Secondary | ICD-10-CM | POA: Insufficient documentation

## 2019-04-12 DIAGNOSIS — Z7902 Long term (current) use of antithrombotics/antiplatelets: Secondary | ICD-10-CM | POA: Diagnosis not present

## 2019-04-12 DIAGNOSIS — W19XXXA Unspecified fall, initial encounter: Secondary | ICD-10-CM | POA: Diagnosis not present

## 2019-04-12 DIAGNOSIS — Y9389 Activity, other specified: Secondary | ICD-10-CM | POA: Insufficient documentation

## 2019-04-12 DIAGNOSIS — M47816 Spondylosis without myelopathy or radiculopathy, lumbar region: Secondary | ICD-10-CM | POA: Diagnosis not present

## 2019-04-12 DIAGNOSIS — F17210 Nicotine dependence, cigarettes, uncomplicated: Secondary | ICD-10-CM | POA: Insufficient documentation

## 2019-04-12 DIAGNOSIS — M545 Low back pain, unspecified: Secondary | ICD-10-CM

## 2019-04-12 DIAGNOSIS — W1789XA Other fall from one level to another, initial encounter: Secondary | ICD-10-CM | POA: Diagnosis not present

## 2019-04-12 DIAGNOSIS — G894 Chronic pain syndrome: Secondary | ICD-10-CM | POA: Diagnosis not present

## 2019-04-12 DIAGNOSIS — I10 Essential (primary) hypertension: Secondary | ICD-10-CM | POA: Insufficient documentation

## 2019-04-12 DIAGNOSIS — I69354 Hemiplegia and hemiparesis following cerebral infarction affecting left non-dominant side: Secondary | ICD-10-CM | POA: Diagnosis not present

## 2019-04-12 DIAGNOSIS — G8194 Hemiplegia, unspecified affecting left nondominant side: Secondary | ICD-10-CM | POA: Diagnosis not present

## 2019-04-12 DIAGNOSIS — Y999 Unspecified external cause status: Secondary | ICD-10-CM | POA: Diagnosis not present

## 2019-04-12 DIAGNOSIS — M5126 Other intervertebral disc displacement, lumbar region: Secondary | ICD-10-CM | POA: Diagnosis not present

## 2019-04-12 DIAGNOSIS — M5489 Other dorsalgia: Secondary | ICD-10-CM | POA: Diagnosis not present

## 2019-04-12 DIAGNOSIS — S3992XA Unspecified injury of lower back, initial encounter: Secondary | ICD-10-CM | POA: Diagnosis not present

## 2019-04-12 DIAGNOSIS — M549 Dorsalgia, unspecified: Secondary | ICD-10-CM | POA: Diagnosis not present

## 2019-04-12 DIAGNOSIS — G8929 Other chronic pain: Secondary | ICD-10-CM | POA: Diagnosis not present

## 2019-04-12 MED ORDER — ORPHENADRINE CITRATE 30 MG/ML IJ SOLN
60.0000 mg | Freq: Two times a day (BID) | INTRAMUSCULAR | Status: DC
  Administered 2019-04-12: 60 mg via INTRAMUSCULAR
  Filled 2019-04-12: qty 2

## 2019-04-12 MED ORDER — ORPHENADRINE CITRATE ER 100 MG PO TB12: 100.0000 mg | ORAL_TABLET | Freq: Two times a day (BID) | ORAL | 0 refills | Status: DC | PRN

## 2019-04-12 NOTE — ED Notes (Addendum)
Pt loaded in [place] and discharged with paperwork. Pt needed staff assist to get into cab. RN asked multiple times and Provider had asked multiple times if pt felt able to go home. Pt verbalized he would be fine. RN offered pt a walker but pt refused stating he had one at home.

## 2019-04-12 NOTE — ED Provider Notes (Signed)
[facility] Emergency Department Provider Note ____________________________________________  Time seen: Approximately 10:24 PM  I have reviewed the triage vital signs and the nursing notes.   HISTORY  Chief Complaint Fall and Back Pain    HPI Mark Wells is a 48 y.o. male who presents to the emergency department for evaluation and treatment of low back pain after a mechanical, non-syncopal fall.  Patient has a history of frequent falls secondary to CVA with hemiplegia on the left side.  He also has chronic back pain for which he is under pain management and receives monthly prescriptions of oxycodone.  Patient states that today he was out on his porch, lost his balance, and fell backward approximately 1 foot off the porch.  He landed on the concrete squarely on his lower back. He denies striking his head or loss of consciousness. Since that time he has had an increase in pain.  He denies any loss of bowel or bladder control, paresthesias, or new weaknesses.  Past Medical History:  Diagnosis Date  . Anemia   . Anxiety   . Chronic back pain   . Depression   . Hemiplegia affecting left nondominant side (HCC)   . Hyperlipidemia   . Mood disorder (HCC)   . Personality disorder (HCC)   . Restless leg syndrome   . Sleep apnea   . Stroke [facility])     Patient Active Problem List   Diagnosis Date Noted  . OSA on CPAP 12/09/2018  . Chronic pain syndrome 12/09/2018  . Major depression, recurrent, chronic (HCC) 12/09/2018  . Insomnia 12/09/2018  . Spastic hemiplegia of left dominant side as late effect of cerebral infarction (HCC) 12/07/2018  . Painful orthopaedic hardware (HCC) 09/03/2018  . BACK PAIN 01/07/2011  . ADVERSE DRUG REACTION 01/01/2011  . Essential hypertension 12/28/2010  . LOW BACK PAIN SYNDROME, SEVERE 12/28/2010  . OTHER URINARY INCONTINENCE 12/28/2010    Past Surgical History:  Procedure Laterality Date  . BACK SURGERY      Prior to  Admission medications   Medication Sig Start Date End Date Taking? Authorizing Provider  amLODipine (NORVASC) 5 MG tablet Take 1 tablet (5 mg total) by mouth daily. 02/26/19   Karamalegos, Netta Neat, DO  atorvastatin (LIPITOR) 40 MG tablet Take 1 tablet (40 mg total) by mouth every evening. 02/26/19   Karamalegos, Netta Neat, DO  chlorthalidone (HYGROTON) 25 MG tablet Take 1 tablet (25 mg total) by mouth daily. 02/26/19   Karamalegos, Netta Neat, DO  clopidogrel (PLAVIX) 75 MG tablet Take 1 tablet (75 mg total) by mouth daily. 02/26/19   Karamalegos, Netta Neat, DO  losartan (COZAAR) 100 MG tablet Take 1 tablet (100 mg total) by mouth daily. 02/26/19   Karamalegos, Netta Neat, DO  meclizine (ANTIVERT) 25 MG tablet Take 1 tablet (25 mg total) by mouth 3 (three) times daily as needed for dizziness. 02/26/19   Karamalegos, Netta Neat, DO  methocarbamol (ROBAXIN) 500 MG tablet Take 1 tablet (500 mg total) by mouth every 6 (six) hours as needed for muscle spasms. 02/26/19   Karamalegos, Netta Neat, DO  orphenadrine (NORFLEX) 100 MG tablet Take 1 tablet (100 mg total) by mouth 2 (two) times daily as needed for muscle spasms. 04/12/19   Aileana Hodder, Rulon Eisenmenger B, FNP  oxyCODONE-acetaminophen (PERCOCET) 7.5-325 MG tablet Take 1 tablet by mouth 3 (three) times daily as needed for severe pain.    [provider]  potassium chloride (K-DUR) 10 MEQ tablet Take 1 tablet (10 mEq total)  by mouth 2 (two) times daily. 02/26/19   Karamalegos, Netta NeatAlexander J, DO  pregabalin (LYRICA) 300 MG capsule Take 300 mg by mouth 2 (two) times daily.     [provider]    Allergies Aspirin; Ibuprofen; and Shellfish allergy  History reviewed. No pertinent family history.  Social History Social History   Tobacco Use  . Smoking status: Current Every Day Smoker    Packs/day: 0.50    Types: Cigarettes  . Smokeless tobacco: Current User  Substance Use Topics  . Alcohol use: No  . Drug use: No    Review of  Systems Constitutional: Negative for fever. Cardiovascular: Negative for chest pain. Respiratory: Negative for shortness of breath. Musculoskeletal: Positive for low back pain.  Skin: Negative for wound or lesion on the lower back.  Neurological: Negative for decrease in sensation  ____________________________________________   PHYSICAL EXAM:  VITAL SIGNS: ED Triage Vitals  Enc Vitals Group     BP 04/12/19 1658 113/79     Pulse Rate 04/12/19 1658 73     Resp 04/12/19 1658 (!) 22     Temp 04/12/19 1658 98.5 F (36.9 C)     Temp Source 04/12/19 1658 Oral     SpO2 04/12/19 1651 100 %     Weight 04/12/19 1659 190 lb (86.2 kg)     Height 04/12/19 1659 5\' 6"  (1.676 m)     Head Circumference --      Peak Flow --      Pain Score 04/12/19 1659 10     Pain Loc --      Pain Edu? --      Excl. in GC? --     Constitutional: Alert and oriented. Well appearing and in no acute distress. Eyes: Conjunctivae are clear without discharge or drainage Head: Atraumatic Neck: Supple. No focal midline tenderness. Respiratory: No cough. Respirations are even and unlabored. Musculoskeletal: Patient uncooperative with exam, but observed moving right leg, bending right knee, and rolling onto left side without assistance. Diffuse lumbar tenderness without obvious step off. Neurologic: Motor and sensory function is intact.  Skin: No abrasions, contusions, lesions on the lower back in area of pain.  Psychiatric: Affect and behavior are appropriate.  ____________________________________________   LABS (all labs ordered are listed, but only abnormal results are displayed)  Labs Reviewed - No data to display ____________________________________________  RADIOLOGY  Image of the lumbar spine is negative for acute findings per radiology ____________________________________________   PROCEDURES  Procedures  ____________________________________________   INITIAL IMPRESSION / ASSESSMENT AND  PLAN / ED COURSE  Mark Wells is a 48 y.o. who presents to the emergency department for evaluation after a mechanical, non-syncopal fall at home. X-ray is reassuring. Norflex given with significant relief and he was able to rest.   Patient requests help with "placement." He states that he is unable to care for himself at home. He lives with his wife and parents. I advised him that he does not meet any criteria for admission tonight but I would request a non-emergent social work consult. He is agreeable to that plan.   Patient instructed to follow-up with orthopedics or primary care.  He was also instructed to return to the emergency department for symptoms that change or worsen if unable schedule an appointment with orthopedics or primary care.  Medications  orphenadrine (NORFLEX) injection 60 mg (60 mg Intramuscular Given 04/12/19 1815)    Pertinent labs & imaging results that were available during my care of the patient were  reviewed by me and considered in my medical decision making (see chart for details).  _________________________________________   FINAL CLINICAL IMPRESSION(S) / ED DIAGNOSES  Final diagnoses:  Acute exacerbation of chronic low back pain    ED Discharge Orders         Ordered    orphenadrine (NORFLEX) 100 MG tablet  2 times daily PRN     04/12/19 1936           If controlled substance prescribed during this visit, 12 month history viewed on the [facility] prior to issuing an initial prescription for Schedule II or III opiod.   Chinita Pester, FNP 04/13/19 0001    Jeanmarie Plant, MD 04/18/19 [phone]

## 2019-04-12 NOTE — Discharge Instructions (Signed)
Follow up with your doctor if not improving over the next week or so.  Return to the ER for symptoms that change or worsen if unable to schedule an appointment.

## 2019-04-12 NOTE — ED Triage Notes (Signed)
Pt presents via EMS after a fall backwards from porch, approximately a foot off the ground. Pt landed on back and buttocks. Pt c/o back pain and has PMH of chronic back pain.

## 2019-04-12 NOTE — ED Notes (Signed)
RN called ad updated wife. Pt is taking a cab home but requesting a cab voucher. Pt continues to be very drowsy after muscle relaxer and will sleep in bed a little longer until he is alert enough for a cab. Wife in agreement with plan.

## 2019-04-12 NOTE — ED Notes (Signed)
Patient transported to X-ray 

## 2019-04-12 NOTE — ED Notes (Signed)
RN called [facility] who reports they will be to the hospital for pt in approximately 30-45 minutes. Pt remains in room asleep but arousable to verbal stimuli

## 2019-04-13 ENCOUNTER — Emergency Department
Admission: EM | Admit: 2019-04-13 | Discharge: 2019-04-13 | Disposition: A | Discharge status: Home / Self Care | Payer: Medicare HMO | Attending: Emergency Medicine | Admitting: Emergency Medicine

## 2019-04-13 ENCOUNTER — Emergency Department: Payer: Medicare HMO

## 2019-04-13 ENCOUNTER — Other Ambulatory Visit: Payer: Self-pay

## 2019-04-13 DIAGNOSIS — M25572 Pain in left ankle and joints of left foot: Secondary | ICD-10-CM | POA: Diagnosis not present

## 2019-04-13 DIAGNOSIS — F1994 Other psychoactive substance use, unspecified with psychoactive substance-induced mood disorder: Secondary | ICD-10-CM | POA: Diagnosis not present

## 2019-04-13 DIAGNOSIS — Z79899 Other long term (current) drug therapy: Secondary | ICD-10-CM | POA: Insufficient documentation

## 2019-04-13 DIAGNOSIS — R45851 Suicidal ideations: Secondary | ICD-10-CM | POA: Diagnosis not present

## 2019-04-13 DIAGNOSIS — Z590 Homelessness: Secondary | ICD-10-CM | POA: Insufficient documentation

## 2019-04-13 DIAGNOSIS — Z765 Malingerer [conscious simulation]: Secondary | ICD-10-CM | POA: Diagnosis not present

## 2019-04-13 DIAGNOSIS — M5489 Other dorsalgia: Secondary | ICD-10-CM | POA: Diagnosis not present

## 2019-04-13 DIAGNOSIS — M25551 Pain in right hip: Secondary | ICD-10-CM | POA: Diagnosis not present

## 2019-04-13 DIAGNOSIS — G8114 Spastic hemiplegia affecting left nondominant side: Secondary | ICD-10-CM | POA: Insufficient documentation

## 2019-04-13 DIAGNOSIS — I10 Essential (primary) hypertension: Secondary | ICD-10-CM | POA: Diagnosis not present

## 2019-04-13 DIAGNOSIS — F1920 Other psychoactive substance dependence, uncomplicated: Secondary | ICD-10-CM | POA: Diagnosis not present

## 2019-04-13 DIAGNOSIS — Z7902 Long term (current) use of antithrombotics/antiplatelets: Secondary | ICD-10-CM | POA: Diagnosis not present

## 2019-04-13 DIAGNOSIS — S79911A Unspecified injury of right hip, initial encounter: Secondary | ICD-10-CM | POA: Diagnosis not present

## 2019-04-13 DIAGNOSIS — Y939 Activity, unspecified: Secondary | ICD-10-CM | POA: Diagnosis not present

## 2019-04-13 DIAGNOSIS — F329 Major depressive disorder, single episode, unspecified: Secondary | ICD-10-CM | POA: Diagnosis present

## 2019-04-13 DIAGNOSIS — F39 Unspecified mood [affective] disorder: Secondary | ICD-10-CM | POA: Diagnosis not present

## 2019-04-13 DIAGNOSIS — W19XXXA Unspecified fall, initial encounter: Secondary | ICD-10-CM | POA: Insufficient documentation

## 2019-04-13 DIAGNOSIS — F17210 Nicotine dependence, cigarettes, uncomplicated: Secondary | ICD-10-CM | POA: Diagnosis not present

## 2019-04-13 DIAGNOSIS — Y999 Unspecified external cause status: Secondary | ICD-10-CM | POA: Insufficient documentation

## 2019-04-13 DIAGNOSIS — F29 Unspecified psychosis not due to a substance or known physiological condition: Secondary | ICD-10-CM | POA: Diagnosis not present

## 2019-04-13 DIAGNOSIS — Y92009 Unspecified place in unspecified non-institutional (private) residence as the place of occurrence of the external cause: Secondary | ICD-10-CM | POA: Diagnosis not present

## 2019-04-13 DIAGNOSIS — R0902 Hypoxemia: Secondary | ICD-10-CM | POA: Diagnosis not present

## 2019-04-13 LAB — COMPREHENSIVE METABOLIC PANEL
ALT: 18 U/L (ref 0–44)
AST: 25 U/L (ref 15–41)
Albumin: 3.9 g/dL (ref 3.5–5.0)
Alkaline Phosphatase: 77 U/L (ref 38–126)
Anion gap: 13 (ref 5–15)
BUN: 11 mg/dL (ref 6–20)
CO2: 28 mmol/L (ref 22–32)
Calcium: 8.7 mg/dL — ABNORMAL LOW (ref 8.9–10.3)
Chloride: 97 mmol/L — ABNORMAL LOW (ref 98–111)
Creatinine, Ser: 0.75 mg/dL (ref 0.61–1.24)
GFR calc Af Amer: >60 mL/min (ref >60)
GFR calc non Af Amer: >60 mL/min (ref >60)
Glucose, Bld: 92 mg/dL (ref 70–99)
Potassium: 2.8 mmol/L — ABNORMAL LOW (ref 3.5–5.1)
Sodium: 138 mmol/L (ref 135–145)
Total Bilirubin: 0.9 mg/dL (ref 0.3–1.2)
Total Protein: 6.6 g/dL (ref 6.5–8.1)

## 2019-04-13 LAB — CBC
HCT: 41.4 % (ref 39.0–52.0)
Hemoglobin: 13.7 g/dL (ref 13.0–17.0)
MCH: 29.4 pg (ref 26.0–34.0)
MCHC: 33.1 g/dL (ref 30.0–36.0)
MCV: 88.8 fL (ref 80.0–100.0)
Platelets: 267 10*3/uL (ref 150–400)
RBC: 4.66 MIL/uL (ref 4.22–5.81)
RDW: 13.2 % (ref 11.5–15.5)
WBC: 6.7 10*3/uL (ref 4.0–10.5)
nRBC: 0 % (ref 0.0–0.2)

## 2019-04-13 LAB — URINE DRUG SCREEN, QUALITATIVE (ARMC ONLY)
Amphetamines, Ur Screen: NOT DETECTED
Barbiturates, Ur Screen: NOT DETECTED
Benzodiazepine, Ur Scrn: NOT DETECTED
Cannabinoid 50 Ng, Ur ~~LOC~~: NOT DETECTED
Cocaine Metabolite,Ur ~~LOC~~: POSITIVE — AB
MDMA (Ecstasy)Ur Screen: NOT DETECTED
Methadone Scn, Ur: NOT DETECTED
Opiate, Ur Screen: NOT DETECTED
Phencyclidine (PCP) Ur S: NOT DETECTED
Tricyclic, Ur Screen: NOT DETECTED

## 2019-04-13 LAB — ETHANOL: Alcohol, Ethyl (B): <10 mg/dL (ref <10)

## 2019-04-13 MED ORDER — BACLOFEN 10 MG PO TABS
10.0000 mg | ORAL_TABLET | Freq: Three times a day (TID) | ORAL | Status: DC
  Administered 2019-04-13: 10 mg via ORAL
  Filled 2019-04-13: qty 1

## 2019-04-13 MED ORDER — POTASSIUM CHLORIDE CRYS ER 20 MEQ PO TBCR
40.0000 meq | EXTENDED_RELEASE_TABLET | Freq: Once | ORAL | Status: AC
  Administered 2019-04-13: 40 meq via ORAL
  Filled 2019-04-13: qty 2

## 2019-04-13 MED ORDER — KETOROLAC TROMETHAMINE 10 MG PO TABS
10.0000 mg | ORAL_TABLET | Freq: Once | ORAL | Status: AC
  Administered 2019-04-13: 10 mg via ORAL
  Filled 2019-04-13: qty 1

## 2019-04-13 NOTE — ED Notes (Signed)
Called [facility] to inform them patient is ready to be picked up.

## 2019-04-13 NOTE — ED Notes (Signed)
Pt assisted to toilet, oob with 1 person assist, assisted back to bed

## 2019-04-13 NOTE — ED Notes (Signed)
Pt assisted to bathroom by this EDT.

## 2019-04-13 NOTE — ED Provider Notes (Signed)
-----------------------------------------   8:14 AM on 04/13/2019 -----------------------------------------  I took over care on this patient from Dr. Manson Passey.  The patient has been evaluated by [facility] tele-psychiatrist and cleared for discharge.  The patient was already medically clear.  He is stable for discharge home at this time.  Return precautions have been provided.   Dionne Bucy, MD 04/13/19 [phone]

## 2019-04-13 NOTE — ED Notes (Signed)
Pt unable to find transportation home. Pt wants to stay in ed for CSW placement. Discussed with charge rn about ambulance vs cab voucher. Cab voucher given.

## 2019-04-13 NOTE — Discharge Instructions (Addendum)
Follow-up with your regular primary doctor, psychiatrist, and case manager.  Return to the ER for new, worsening, or persistent pain, weakness or numbness, difficulty walking, or any new or persistent thoughts of wanting to hurt yourself or others.

## 2019-04-13 NOTE — ED Triage Notes (Signed)
EMS pt to Rm 19 H with report of fell tonight on his porch. Landed on his right hip with pain to the area. Pt was seen here earlier this evening for similar complaint.Pt also report his has frequent intermittent thoughts of SI but no plan. States hx of bipolar and off his medications for almost 1 year.

## 2019-04-13 NOTE — ED Provider Notes (Signed)
[facility] Emergency Department Provider Note ____________________   First MD Initiated Contact with Patient 04/13/19 0142     (approximate)  I have reviewed the triage vital signs and the nursing notes.   HISTORY  Chief Complaint Fall    HPI Mark Wells is a 48 y.o. male with medical history as listed below returns to the emergency department after being evaluated earlier this evening secondary to accidental fall resultant low back pain.  Patient states that upon arriving home tonight he fell again with resultant right hip pain.  Patient states current pain score is 8 out of 10 and worse with movement of the right hip.  Patient denies any head injury no loss of consciousness.  Patient states that he falls as a result of his known left side hemiplegia secondary to CVA.  In addition patient also admits to suicidal ideation speak to psychiatrist.  Patient denies any specific plan.        Past Medical History:  Diagnosis Date  . Anemia   . Anxiety   . Chronic back pain   . Depression   . Hemiplegia affecting left nondominant side (HCC)   . Hyperlipidemia   . Mood disorder (HCC)   . Personality disorder (HCC)   . Restless leg syndrome   . Sleep apnea   . Stroke [facility])     Patient Active Problem List   Diagnosis Date Noted  . OSA on CPAP 12/09/2018  . Chronic pain syndrome 12/09/2018  . Major depression, recurrent, chronic (HCC) 12/09/2018  . Insomnia 12/09/2018  . Spastic hemiplegia of left dominant side as late effect of cerebral infarction (HCC) 12/07/2018  . Painful orthopaedic hardware (HCC) 09/03/2018  . BACK PAIN 01/07/2011  . ADVERSE DRUG REACTION 01/01/2011  . Essential hypertension 12/28/2010  . LOW BACK PAIN SYNDROME, SEVERE 12/28/2010  . OTHER URINARY INCONTINENCE 12/28/2010    Past Surgical History:  Procedure Laterality Date  . BACK SURGERY      Prior to Admission medications   Medication Sig Start Date End Date Taking?  Authorizing Provider  amLODipine (NORVASC) 5 MG tablet Take 1 tablet (5 mg total) by mouth daily. 02/26/19  Yes Karamalegos, Netta Neat, DO  atorvastatin (LIPITOR) 40 MG tablet Take 1 tablet (40 mg total) by mouth every evening. 02/26/19  Yes Karamalegos, Netta Neat, DO  baclofen (LIORESAL) 20 MG tablet Take 20 mg by mouth 4 (four) times daily. 03/21/19  Yes [provider]  chlorthalidone (HYGROTON) 25 MG tablet Take 1 tablet (25 mg total) by mouth daily. 02/26/19  Yes Karamalegos, Netta Neat, DO  clopidogrel (PLAVIX) 75 MG tablet Take 1 tablet (75 mg total) by mouth daily. 02/26/19  Yes Karamalegos, Netta Neat, DO  losartan (COZAAR) 100 MG tablet Take 1 tablet (100 mg total) by mouth daily. 02/26/19  Yes Karamalegos, Netta Neat, DO  meclizine (ANTIVERT) 25 MG tablet Take 1 tablet (25 mg total) by mouth 3 (three) times daily as needed for dizziness. 02/26/19  Yes Karamalegos, Netta Neat, DO  methocarbamol (ROBAXIN) 500 MG tablet Take 1 tablet (500 mg total) by mouth every 6 (six) hours as needed for muscle spasms. 02/26/19  Yes Karamalegos, Netta Neat, DO  orphenadrine (NORFLEX) 100 MG tablet Take 1 tablet (100 mg total) by mouth 2 (two) times daily as needed for muscle spasms. 04/12/19  Yes Triplett, Cari B, FNP  oxyCODONE-acetaminophen (PERCOCET) 7.5-325 MG tablet Take 1 tablet by mouth 3 (three) times daily as needed for severe pain.  Yes [provider]  potassium chloride (K-DUR) 10 MEQ tablet Take 1 tablet (10 mEq total) by mouth 2 (two) times daily. 02/26/19  Yes Karamalegos, Netta Neat, DO  pregabalin (LYRICA) 300 MG capsule Take 300 mg by mouth 2 (two) times daily.    Yes [provider]    Allergies Aspirin; Ibuprofen; and Shellfish allergy  No family history on file.  Social History Social History   Tobacco Use  . Smoking status: Current Every Day Smoker    Packs/day: 0.50    Types: Cigarettes  . Smokeless tobacco: Current User  Substance Use Topics  .  Alcohol use: No  . Drug use: No    Review of Systems Constitutional: No fever/chills Eyes: No visual changes. ENT: No sore throat. Cardiovascular: Denies chest pain. Respiratory: Denies shortness of breath. Gastrointestinal: No abdominal pain.  No nausea, no vomiting.  No diarrhea.  No constipation. Genitourinary: Negative for dysuria. Musculoskeletal: Negative for neck pain.  Negative for back pain.  Positive for right hip pain Integumentary: Negative for rash. Neurological: Negative for headaches, focal weakness or numbness. Psychiatric:  Positive for suicidal ideation   ____________________________________________   PHYSICAL EXAM:  VITAL SIGNS: ED Triage Vitals  Enc Vitals Group     BP 04/13/19 0127 130/75     Pulse Rate 04/13/19 0127 68     Resp 04/13/19 0127 16     Temp 04/13/19 0127 97.6 F (36.4 C)     Temp Source 04/13/19 0127 Oral     SpO2 04/13/19 0127 100 %     Weight 04/13/19 0128 86.2 kg (190 lb)     Height 04/13/19 0128 1.676 m ( )     Head Circumference --      Peak Flow --      Pain Score 04/13/19 0128 10     Pain Loc --      Pain Edu? --      Excl. in GC? --     Constitutional: Alert and oriented. Well appearing and in no acute distress. Eyes: Conjunctivae are normal.  Head: Atraumatic. Mouth/Throat: Mucous membranes are moist.  Oropharynx non-erythematous. Neck: No stridor.  Cardiovascular: Normal rate, regular rhythm. Good peripheral circulation. Grossly normal heart sounds. Respiratory: Normal respiratory effort.  No retractions. No audible wheezing. Gastrointestinal: Soft and nontender. No distention.  Musculoskeletal: Pain to palpation of the right hip.  No pain with passive range of motion of the right hip Neurologic:  Normal speech and language. No gross focal neurologic deficits are appreciated.  Skin:  Skin is warm, dry and intact. No rash noted. Psychiatric: Mood and affect are normal. Speech and behavior are  normal.  ____________________________________________   LABS (all labs ordered are listed, but only abnormal results are displayed)  Labs Reviewed  COMPREHENSIVE METABOLIC PANEL - Abnormal; Notable for the following components:      Result Value   Potassium 2.8 (*)    Chloride 97 (*)    Calcium 8.7 (*)    All other components within normal limits  URINE DRUG SCREEN, QUALITATIVE ([facility] ONLY) - Abnormal; Notable for the following components:   Cocaine Metabolite,Ur [place] POSITIVE (*)    All other components within normal limits  CBC  ETHANOL   ____________________________________________  RADIOLOGY I, [place] N Marthena Whitmyer, personally viewed and evaluated these images (plain radiographs) as part of my medical decision making, as well as reviewing the written report by the radiologist.  ED MD interpretation: Negative right hip x-ray  Official radiology report(s): Dg  Lumbar Spine 2-3 Views  Result Date: 04/12/2019 CLINICAL DATA:  Fall backwards from porch landing on back and buttocks. Back pain. EXAM: LUMBAR SPINE - 2-3 VIEW COMPARISON:  Lumbar spine CT 09/07/2018 FINDINGS: There are 5 non rib-bearing lumbar type vertebrae. There is mild straightening of the lumbar lordosis with minimal anterolisthesis of L4 on L5. Postoperative changes are again seen at L5-S1 with subsidence of an interbody implant and with retain left-sided pedicle screw fragments appearing unchanged. There is minimal disc space narrowing at L3-4 and L4-5. Vertebral body heights are preserved without an acute fracture identified. Aortic atherosclerosis is noted. IMPRESSION: No evidence of acute osseous abnormality. Electronically Signed   By: Sebastian AcheAllen  Grady M.D.   On: 04/12/2019 19:07   Dg Hip Unilat W Or Wo Pelvis 2-3 Views Right  Result Date: 04/13/2019 CLINICAL DATA:  Recent fall with right hip pain, initial encounter EXAM: DG HIP (WITH OR WITHOUT PELVIS) 2-3V RIGHT COMPARISON:  12/18/2018 FINDINGS: Pelvic ring is intact. No  acute fracture or dislocation is noted. Postsurgical changes are noted in the lower lumbar spine. IMPRESSION: No acute abnormality noted. Electronically Signed   By: Alcide CleverMark  Lukens M.D.   On: 04/13/2019 02:02      Procedures   ____________________________________________   INITIAL IMPRESSION / MDM / ASSESSMENT AND PLAN / ED COURSE  As part of my medical decision making, I reviewed the following data within the electronic MEDICAL RECORD NUMBER   48 year old male presented with above-stated history and physical exam secondary to accidental fall resultant right hip pain.  X-ray revealed no evidence of fracture or dislocation.  Patient also voiced suicidal ideation as such awaiting psychiatry consultation.  York SpanielRonnie Luczynski was evaluated in Emergency Department on 04/13/2019 for the symptoms described in the history of present illness. He was evaluated in the context of the global COVID-19 pandemic, which necessitated consideration that the patient might be at risk for infection with the SARS-CoV-2 virus that causes COVID-19. Institutional protocols and algorithms that pertain to the evaluation of patients at risk for COVID-19 are in a state of rapid change based on information released by regulatory bodies including the CDC and federal and state organizations. These policies and algorithms were followed during the patient's care in the ED.           ____________________________________________  FINAL CLINICAL IMPRESSION(S) / ED DIAGNOSES  Final diagnoses:  Suicidal ideation     MEDICATIONS GIVEN DURING THIS VISIT:  Medications  ketorolac (TORADOL) tablet 10 mg (10 mg Oral Given 04/13/19 0259)  potassium chloride SA (K-DUR) CR tablet 40 mEq (40 mEq Oral Given 04/13/19 0510)     ED Discharge Orders    None       Note:  This document was prepared using Dragon voice recognition software and may include unintentional dictation errors.   Darci CurrentBrown, [place] N, MD 04/13/19 [phone]

## 2019-04-15 DIAGNOSIS — I10 Essential (primary) hypertension: Secondary | ICD-10-CM | POA: Diagnosis not present

## 2019-04-15 DIAGNOSIS — F338 Other recurrent depressive disorders: Secondary | ICD-10-CM | POA: Diagnosis not present

## 2019-04-15 DIAGNOSIS — G894 Chronic pain syndrome: Secondary | ICD-10-CM | POA: Diagnosis not present

## 2019-04-15 DIAGNOSIS — M1990 Unspecified osteoarthritis, unspecified site: Secondary | ICD-10-CM | POA: Diagnosis not present

## 2019-04-15 DIAGNOSIS — M5136 Other intervertebral disc degeneration, lumbar region: Secondary | ICD-10-CM | POA: Diagnosis not present

## 2019-04-15 DIAGNOSIS — M5106 Intervertebral disc disorders with myelopathy, lumbar region: Secondary | ICD-10-CM | POA: Diagnosis not present

## 2019-04-15 DIAGNOSIS — G4733 Obstructive sleep apnea (adult) (pediatric): Secondary | ICD-10-CM | POA: Diagnosis not present

## 2019-04-15 DIAGNOSIS — R498 Other voice and resonance disorders: Secondary | ICD-10-CM | POA: Diagnosis not present

## 2019-04-15 DIAGNOSIS — I69354 Hemiplegia and hemiparesis following cerebral infarction affecting left non-dominant side: Secondary | ICD-10-CM | POA: Diagnosis not present

## 2019-04-16 ENCOUNTER — Ambulatory Visit: Payer: Self-pay

## 2019-04-16 DIAGNOSIS — M47816 Spondylosis without myelopathy or radiculopathy, lumbar region: Secondary | ICD-10-CM | POA: Diagnosis not present

## 2019-04-16 DIAGNOSIS — G894 Chronic pain syndrome: Secondary | ICD-10-CM | POA: Diagnosis not present

## 2019-04-16 DIAGNOSIS — M5126 Other intervertebral disc displacement, lumbar region: Secondary | ICD-10-CM | POA: Diagnosis not present

## 2019-04-16 DIAGNOSIS — I69354 Hemiplegia and hemiparesis following cerebral infarction affecting left non-dominant side: Secondary | ICD-10-CM | POA: Diagnosis not present

## 2019-04-16 DIAGNOSIS — M545 Low back pain: Secondary | ICD-10-CM | POA: Diagnosis not present

## 2019-04-16 DIAGNOSIS — G5602 Carpal tunnel syndrome, left upper limb: Secondary | ICD-10-CM | POA: Diagnosis not present

## 2019-04-16 DIAGNOSIS — I10 Essential (primary) hypertension: Secondary | ICD-10-CM | POA: Diagnosis not present

## 2019-04-17 DIAGNOSIS — G4733 Obstructive sleep apnea (adult) (pediatric): Secondary | ICD-10-CM | POA: Diagnosis not present

## 2019-04-17 DIAGNOSIS — R498 Other voice and resonance disorders: Secondary | ICD-10-CM | POA: Diagnosis not present

## 2019-04-17 DIAGNOSIS — M1990 Unspecified osteoarthritis, unspecified site: Secondary | ICD-10-CM | POA: Diagnosis not present

## 2019-04-17 DIAGNOSIS — M5136 Other intervertebral disc degeneration, lumbar region: Secondary | ICD-10-CM | POA: Diagnosis not present

## 2019-04-17 DIAGNOSIS — I10 Essential (primary) hypertension: Secondary | ICD-10-CM | POA: Diagnosis not present

## 2019-04-17 DIAGNOSIS — G894 Chronic pain syndrome: Secondary | ICD-10-CM | POA: Diagnosis not present

## 2019-04-17 DIAGNOSIS — F338 Other recurrent depressive disorders: Secondary | ICD-10-CM | POA: Diagnosis not present

## 2019-04-17 DIAGNOSIS — M47816 Spondylosis without myelopathy or radiculopathy, lumbar region: Secondary | ICD-10-CM | POA: Diagnosis not present

## 2019-04-17 DIAGNOSIS — M5106 Intervertebral disc disorders with myelopathy, lumbar region: Secondary | ICD-10-CM | POA: Diagnosis not present

## 2019-04-17 DIAGNOSIS — M5126 Other intervertebral disc displacement, lumbar region: Secondary | ICD-10-CM | POA: Diagnosis not present

## 2019-04-17 DIAGNOSIS — I69354 Hemiplegia and hemiparesis following cerebral infarction affecting left non-dominant side: Secondary | ICD-10-CM | POA: Diagnosis not present

## 2019-04-18 ENCOUNTER — Ambulatory Visit: Payer: Self-pay | Admitting: Physical Therapy

## 2019-04-18 DIAGNOSIS — G894 Chronic pain syndrome: Secondary | ICD-10-CM | POA: Diagnosis not present

## 2019-04-18 DIAGNOSIS — I69354 Hemiplegia and hemiparesis following cerebral infarction affecting left non-dominant side: Secondary | ICD-10-CM | POA: Diagnosis not present

## 2019-04-18 DIAGNOSIS — M47816 Spondylosis without myelopathy or radiculopathy, lumbar region: Secondary | ICD-10-CM | POA: Diagnosis not present

## 2019-04-18 DIAGNOSIS — I10 Essential (primary) hypertension: Secondary | ICD-10-CM | POA: Diagnosis not present

## 2019-04-18 DIAGNOSIS — M5126 Other intervertebral disc displacement, lumbar region: Secondary | ICD-10-CM | POA: Diagnosis not present

## 2019-04-23 ENCOUNTER — Ambulatory Visit: Payer: Self-pay | Admitting: Physical Therapy

## 2019-04-23 ENCOUNTER — Telehealth: Payer: Self-pay | Admitting: Family Medicine

## 2019-04-23 DIAGNOSIS — M5106 Intervertebral disc disorders with myelopathy, lumbar region: Secondary | ICD-10-CM | POA: Diagnosis not present

## 2019-04-23 DIAGNOSIS — M5136 Other intervertebral disc degeneration, lumbar region: Secondary | ICD-10-CM | POA: Diagnosis not present

## 2019-04-23 DIAGNOSIS — I10 Essential (primary) hypertension: Secondary | ICD-10-CM | POA: Diagnosis not present

## 2019-04-23 DIAGNOSIS — G894 Chronic pain syndrome: Secondary | ICD-10-CM | POA: Diagnosis not present

## 2019-04-23 DIAGNOSIS — M1990 Unspecified osteoarthritis, unspecified site: Secondary | ICD-10-CM | POA: Diagnosis not present

## 2019-04-23 DIAGNOSIS — I69354 Hemiplegia and hemiparesis following cerebral infarction affecting left non-dominant side: Secondary | ICD-10-CM | POA: Diagnosis not present

## 2019-04-23 DIAGNOSIS — R498 Other voice and resonance disorders: Secondary | ICD-10-CM | POA: Diagnosis not present

## 2019-04-23 DIAGNOSIS — F338 Other recurrent depressive disorders: Secondary | ICD-10-CM | POA: Diagnosis not present

## 2019-04-23 DIAGNOSIS — M5126 Other intervertebral disc displacement, lumbar region: Secondary | ICD-10-CM | POA: Diagnosis not present

## 2019-04-23 DIAGNOSIS — G4733 Obstructive sleep apnea (adult) (pediatric): Secondary | ICD-10-CM | POA: Diagnosis not present

## 2019-04-23 DIAGNOSIS — M47816 Spondylosis without myelopathy or radiculopathy, lumbar region: Secondary | ICD-10-CM | POA: Diagnosis not present

## 2019-04-23 NOTE — Telephone Encounter (Signed)
[facility]  Called requesting a order for   [employer] .  Fax to  [phone]

## 2019-04-23 NOTE — Telephone Encounter (Signed)
New item now for [facility] - Handwritten rx order, signed, dx spastic hemiplegia of L side due to CVA (ICD: W80.881)  To be faxed  Saralyn Pilar, DO [facility] 04/23/2019, 2:08 PM

## 2019-04-25 ENCOUNTER — Ambulatory Visit: Payer: Self-pay | Admitting: Physical Therapy

## 2019-04-25 DIAGNOSIS — M5106 Intervertebral disc disorders with myelopathy, lumbar region: Secondary | ICD-10-CM | POA: Diagnosis not present

## 2019-04-25 DIAGNOSIS — I69354 Hemiplegia and hemiparesis following cerebral infarction affecting left non-dominant side: Secondary | ICD-10-CM | POA: Diagnosis not present

## 2019-04-25 DIAGNOSIS — M1990 Unspecified osteoarthritis, unspecified site: Secondary | ICD-10-CM | POA: Diagnosis not present

## 2019-04-25 DIAGNOSIS — F338 Other recurrent depressive disorders: Secondary | ICD-10-CM | POA: Diagnosis not present

## 2019-04-25 DIAGNOSIS — M5136 Other intervertebral disc degeneration, lumbar region: Secondary | ICD-10-CM | POA: Diagnosis not present

## 2019-04-25 DIAGNOSIS — I10 Essential (primary) hypertension: Secondary | ICD-10-CM | POA: Diagnosis not present

## 2019-04-25 DIAGNOSIS — G894 Chronic pain syndrome: Secondary | ICD-10-CM | POA: Diagnosis not present

## 2019-04-25 DIAGNOSIS — R498 Other voice and resonance disorders: Secondary | ICD-10-CM | POA: Diagnosis not present

## 2019-04-25 DIAGNOSIS — G4733 Obstructive sleep apnea (adult) (pediatric): Secondary | ICD-10-CM | POA: Diagnosis not present

## 2019-04-29 DIAGNOSIS — M5126 Other intervertebral disc displacement, lumbar region: Secondary | ICD-10-CM | POA: Diagnosis not present

## 2019-04-29 DIAGNOSIS — I69354 Hemiplegia and hemiparesis following cerebral infarction affecting left non-dominant side: Secondary | ICD-10-CM | POA: Diagnosis not present

## 2019-04-29 DIAGNOSIS — G894 Chronic pain syndrome: Secondary | ICD-10-CM | POA: Diagnosis not present

## 2019-04-29 DIAGNOSIS — M47816 Spondylosis without myelopathy or radiculopathy, lumbar region: Secondary | ICD-10-CM | POA: Diagnosis not present

## 2019-04-29 DIAGNOSIS — I10 Essential (primary) hypertension: Secondary | ICD-10-CM | POA: Diagnosis not present

## 2019-04-30 DIAGNOSIS — M47816 Spondylosis without myelopathy or radiculopathy, lumbar region: Secondary | ICD-10-CM | POA: Diagnosis not present

## 2019-04-30 DIAGNOSIS — I69354 Hemiplegia and hemiparesis following cerebral infarction affecting left non-dominant side: Secondary | ICD-10-CM | POA: Diagnosis not present

## 2019-04-30 DIAGNOSIS — I10 Essential (primary) hypertension: Secondary | ICD-10-CM | POA: Diagnosis not present

## 2019-04-30 DIAGNOSIS — G894 Chronic pain syndrome: Secondary | ICD-10-CM | POA: Diagnosis not present

## 2019-04-30 DIAGNOSIS — M5126 Other intervertebral disc displacement, lumbar region: Secondary | ICD-10-CM | POA: Diagnosis not present

## 2019-05-01 DIAGNOSIS — M47816 Spondylosis without myelopathy or radiculopathy, lumbar region: Secondary | ICD-10-CM | POA: Diagnosis not present

## 2019-05-01 DIAGNOSIS — I10 Essential (primary) hypertension: Secondary | ICD-10-CM | POA: Diagnosis not present

## 2019-05-01 DIAGNOSIS — M1990 Unspecified osteoarthritis, unspecified site: Secondary | ICD-10-CM | POA: Diagnosis not present

## 2019-05-01 DIAGNOSIS — G894 Chronic pain syndrome: Secondary | ICD-10-CM | POA: Diagnosis not present

## 2019-05-01 DIAGNOSIS — R498 Other voice and resonance disorders: Secondary | ICD-10-CM | POA: Diagnosis not present

## 2019-05-01 DIAGNOSIS — M5106 Intervertebral disc disorders with myelopathy, lumbar region: Secondary | ICD-10-CM | POA: Diagnosis not present

## 2019-05-01 DIAGNOSIS — M5136 Other intervertebral disc degeneration, lumbar region: Secondary | ICD-10-CM | POA: Diagnosis not present

## 2019-05-01 DIAGNOSIS — I69354 Hemiplegia and hemiparesis following cerebral infarction affecting left non-dominant side: Secondary | ICD-10-CM | POA: Diagnosis not present

## 2019-05-01 DIAGNOSIS — G4733 Obstructive sleep apnea (adult) (pediatric): Secondary | ICD-10-CM | POA: Diagnosis not present

## 2019-05-01 DIAGNOSIS — F338 Other recurrent depressive disorders: Secondary | ICD-10-CM | POA: Diagnosis not present

## 2019-05-01 DIAGNOSIS — M5126 Other intervertebral disc displacement, lumbar region: Secondary | ICD-10-CM | POA: Diagnosis not present

## 2019-05-02 DIAGNOSIS — M47816 Spondylosis without myelopathy or radiculopathy, lumbar region: Secondary | ICD-10-CM | POA: Diagnosis not present

## 2019-05-02 DIAGNOSIS — I10 Essential (primary) hypertension: Secondary | ICD-10-CM | POA: Diagnosis not present

## 2019-05-02 DIAGNOSIS — G894 Chronic pain syndrome: Secondary | ICD-10-CM | POA: Diagnosis not present

## 2019-05-02 DIAGNOSIS — M5126 Other intervertebral disc displacement, lumbar region: Secondary | ICD-10-CM | POA: Diagnosis not present

## 2019-05-02 DIAGNOSIS — I69354 Hemiplegia and hemiparesis following cerebral infarction affecting left non-dominant side: Secondary | ICD-10-CM | POA: Diagnosis not present

## 2019-05-03 ENCOUNTER — Ambulatory Visit (INDEPENDENT_AMBULATORY_CARE_PROVIDER_SITE_OTHER): Payer: Medicare HMO | Admitting: Family Medicine

## 2019-05-03 ENCOUNTER — Encounter: Payer: Self-pay | Admitting: Family Medicine

## 2019-05-03 ENCOUNTER — Other Ambulatory Visit: Payer: Self-pay

## 2019-05-03 ENCOUNTER — Other Ambulatory Visit: Payer: Self-pay | Admitting: Family Medicine

## 2019-05-03 VITALS — BP 118/85

## 2019-05-03 DIAGNOSIS — G894 Chronic pain syndrome: Secondary | ICD-10-CM | POA: Diagnosis not present

## 2019-05-03 DIAGNOSIS — G4733 Obstructive sleep apnea (adult) (pediatric): Secondary | ICD-10-CM | POA: Diagnosis not present

## 2019-05-03 DIAGNOSIS — I6930 Unspecified sequelae of cerebral infarction: Secondary | ICD-10-CM

## 2019-05-03 DIAGNOSIS — R1031 Right lower quadrant pain: Secondary | ICD-10-CM | POA: Diagnosis not present

## 2019-05-03 DIAGNOSIS — R498 Other voice and resonance disorders: Secondary | ICD-10-CM | POA: Diagnosis not present

## 2019-05-03 DIAGNOSIS — F338 Other recurrent depressive disorders: Secondary | ICD-10-CM | POA: Diagnosis not present

## 2019-05-03 DIAGNOSIS — M5136 Other intervertebral disc degeneration, lumbar region: Secondary | ICD-10-CM | POA: Diagnosis not present

## 2019-05-03 DIAGNOSIS — I69354 Hemiplegia and hemiparesis following cerebral infarction affecting left non-dominant side: Secondary | ICD-10-CM | POA: Diagnosis not present

## 2019-05-03 DIAGNOSIS — E782 Mixed hyperlipidemia: Secondary | ICD-10-CM

## 2019-05-03 DIAGNOSIS — M1990 Unspecified osteoarthritis, unspecified site: Secondary | ICD-10-CM | POA: Diagnosis not present

## 2019-05-03 DIAGNOSIS — I10 Essential (primary) hypertension: Secondary | ICD-10-CM | POA: Diagnosis not present

## 2019-05-03 DIAGNOSIS — M5106 Intervertebral disc disorders with myelopathy, lumbar region: Secondary | ICD-10-CM | POA: Diagnosis not present

## 2019-05-03 MED ORDER — ATORVASTATIN CALCIUM 40 MG PO TABS: 40.0000 mg | ORAL_TABLET | Freq: Every evening | ORAL | 1 refills | Status: DC

## 2019-05-03 NOTE — Progress Notes (Signed)
Virtual Visit via Telephone The purpose of this virtual visit is to provide medical care while limiting exposure to the novel coronavirus (COVID19) for both patient and office staff.  Consent was obtained for phone visit:  Yes.   Answered questions that patient had about telehealth interaction:  Yes.   I discussed the limitations, risks, security and privacy concerns of performing an evaluation and management service by telephone. I also discussed with the patient that there may be a patient responsible charge related to this service. The patient expressed understanding and agreed to proceed.  Patient Location: Home Provider Location: [facility])  ---------------------------------------------------------------------- Chief Complaint  Patient presents with  . Groin Pain    pt states it feels like a pulling pain when he moves x 5 days. Denies urinary issues, swelling , or injury.    S: Reviewed CMA documentation. I have called patient and gathered additional HPI as follows:  Right Groin Pain Reports that symptoms started about 5 days ago, without acute injury, it seemed to gradually start worsening, worse with bending forward, it seems like it has gradually worsened since onset, seems to be localized to R side at top of his leg in groin. - He is followed by pain management, on opioid therapy, on lyrica and muscle relaxants already, some relief but not always helping it, worse with certain movements as he described - Not using muscle rub or heating pad - Denies any radiating pain into scrotum or bulging or hernia, no prior hernia - Denies any worsening numbness weakness or tingling  Denies any high risk travel to areas of current concern for COVID19. Denies any known or suspected exposure to person with or possibly with COVID19.  Denies any fevers, chills, sweats, body ache, cough, shortness of breath, sinus pain or pressure, headache, abdominal pain,  diarrhea  Past Medical History:  Diagnosis Date  . Anemia   . Anxiety   . Chronic back pain   . Depression   . Hemiplegia affecting left nondominant side (HCC)   . Hyperlipidemia   . Mood disorder (HCC)   . Personality disorder (HCC)   . Restless leg syndrome   . Sleep apnea   . Stroke [facility])    Social History   Tobacco Use  . Smoking status: Current Every Day Smoker    Packs/day: 0.50    Types: Cigarettes  . Smokeless tobacco: Current User  Substance Use Topics  . Alcohol use: No  . Drug use: No    Current Outpatient Medications:  .  amLODipine (NORVASC) 5 MG tablet, Take 1 tablet (5 mg total) by mouth daily., Disp: 90 tablet, Rfl: 1 .  baclofen (LIORESAL) 20 MG tablet, Take 20 mg by mouth 4 (four) times daily., Disp: , Rfl:  .  chlorthalidone (HYGROTON) 25 MG tablet, Take 1 tablet (25 mg total) by mouth daily., Disp: 90 tablet, Rfl: 1 .  clopidogrel (PLAVIX) 75 MG tablet, Take 1 tablet (75 mg total) by mouth daily., Disp: 90 tablet, Rfl: 1 .  losartan (COZAAR) 100 MG tablet, Take 1 tablet (100 mg total) by mouth daily., Disp: 90 tablet, Rfl: 1 .  meclizine (ANTIVERT) 25 MG tablet, Take 1 tablet (25 mg total) by mouth 3 (three) times daily as needed for dizziness., Disp: 90 tablet, Rfl: 1 .  methocarbamol (ROBAXIN) 500 MG tablet, Take 1 tablet (500 mg total) by mouth every 6 (six) hours as needed for muscle spasms., Disp: 120 tablet, Rfl: 2 .  oxyCODONE-acetaminophen (PERCOCET) 7.5-325  MG tablet, Take 1 tablet by mouth 3 (three) times daily as needed for severe pain., Disp: , Rfl:  .  pregabalin (LYRICA) 300 MG capsule, Take 300 mg by mouth 2 (two) times daily. , Disp: , Rfl:  .  atorvastatin (LIPITOR) 40 MG tablet, Take 1 tablet (40 mg total) by mouth every evening., Disp: 90 tablet, Rfl: 1 .  potassium chloride (K-DUR) 10 MEQ tablet, Take 1 tablet (10 mEq total) by mouth 2 (two) times daily. (Patient not taking: Reported on 05/03/2019), Disp: 180 tablet, Rfl: 1  Depression  screen [facility] 05/03/2019  Decreased Interest 0  Down, Depressed, Hopeless 1  PHQ - 2 Score 1  Altered sleeping 0  Tired, decreased energy 0  Change in appetite 0  Feeling bad or failure about yourself  0  Trouble concentrating 0  Moving slowly or fidgety/restless 0  Suicidal thoughts 0  PHQ-9 Score 1  Difficult doing work/chores Not difficult at all    No flowsheet data found.  -------------------------------------------------------------------------- O: No physical exam performed due to remote telephone encounter.  Lab results reviewed.  Recent Results (from the past 2160 hour(s))  CBC     Status: None   Collection Time: 04/13/19  2:31 AM  Result Value Ref Range   WBC 6.7 4.0 - 10.5 K/uL   RBC 4.66 4.22 - 5.81 MIL/uL   Hemoglobin 13.7 13.0 - 17.0 g/dL   HCT 16.141.4 09.639.0 - 04.552.0 %   MCV 88.8 80.0 - 100.0 fL   MCH 29.4 26.0 - 34.0 pg   MCHC 33.1 30.0 - 36.0 g/dL   RDW 40.913.2 81.111.5 - 91.415.5 %   Platelets 267 150 - 400 K/uL   nRBC 0.0 0.0 - 0.2 %    Comment: Performed at [facility], [address] [place], [place] [address]  Comprehensive metabolic panel     Status: Abnormal   Collection Time: 04/13/19  2:31 AM  Result Value Ref Range   Sodium 138 135 - 145 mmol/L   Potassium 2.8 (L) 3.5 - 5.1 mmol/L   Chloride 97 (L) 98 - 111 mmol/L   CO2 28 22 - 32 mmol/L   Glucose, Bld 92 70 - 99 mg/dL   BUN 11 6 - 20 mg/dL   Creatinine, Ser 6.210.75 0.61 - 1.24 mg/dL   Calcium 8.7 (L) 8.9 - 10.3 mg/dL   Total Protein 6.6 6.5 - 8.1 g/dL   Albumin 3.9 3.5 - 5.0 g/dL   AST 25 15 - 41 U/L   ALT 18 0 - 44 U/L   Alkaline Phosphatase 77 38 - 126 U/L   Total Bilirubin 0.9 0.3 - 1.2 mg/dL   GFR calc non Af Amer >60 >60 mL/min   GFR calc Af Amer >60 >60 mL/min   Anion gap 13 5 - 15    Comment: Performed at [facility], [address] [place], [place] [address]  Ethanol     Status: None   Collection Time: 04/13/19  2:31 AM  Result Value Ref Range   Alcohol, Ethyl (B) <10  <10 mg/dL    Comment: (NOTE) Lowest detectable limit for serum alcohol is 10 mg/dL. For medical purposes only. Performed at [facility], [address] [place], [place] [address]   Urine Drug Screen, Qualitative [facility] only)     Status: Abnormal   Collection Time: 04/13/19  2:57 AM  Result Value Ref Range   Tricyclic, Ur Screen NONE DETECTED NONE DETECTED   Amphetamines, Ur Screen  NONE DETECTED NONE DETECTED   MDMA (Ecstasy)Ur Screen NONE DETECTED NONE DETECTED   Cocaine Metabolite,Ur [place] POSITIVE (A) NONE DETECTED   Opiate, Ur Screen NONE DETECTED NONE DETECTED   Phencyclidine (PCP) Ur S NONE DETECTED NONE DETECTED   Cannabinoid 50 Ng, Ur [place] NONE DETECTED NONE DETECTED   Barbiturates, Ur Screen NONE DETECTED NONE DETECTED   Benzodiazepine, Ur Scrn NONE DETECTED NONE DETECTED   Methadone Scn, Ur NONE DETECTED NONE DETECTED    Comment: (NOTE) Tricyclics + metabolites, urine    Cutoff 1000 ng/mL Amphetamines + metabolites, urine  Cutoff 1000 ng/mL MDMA (Ecstasy), urine              Cutoff 500 ng/mL Cocaine Metabolite, urine          Cutoff 300 ng/mL Opiate + metabolites, urine        Cutoff 300 ng/mL Phencyclidine (PCP), urine         Cutoff 25 ng/mL Cannabinoid, urine                 Cutoff 50 ng/mL Barbiturates + metabolites, urine  Cutoff 200 ng/mL Benzodiazepine, urine              Cutoff 200 ng/mL Methadone, urine                   Cutoff 300 ng/mL The urine drug screen provides only a preliminary, unconfirmed analytical test result and should not be used for non-medical purposes. Clinical consideration and professional judgment should be applied to any positive drug screen result due to possible interfering substances. A more specific alternate chemical method must be used in order to obtain a confirmed analytical result. Gas chromatography / mass spectrometry (GC/MS) is the preferred confirmat ory method. Performed at [facility], [address] [place], [place] [address]     -------------------------------------------------------------------------- A&P:  Problem List Items Addressed This Visit    None    Visit Diagnoses    Right groin pain    -  Primary   History of cerebrovascular accident (CVA) with residual deficit         Clinically with acute or recent onset R localized groin, pain provoked on movements bending, he is limited due to history of CVA with existing extremity weakness and limited mobility currently and this seems like muscle strain worsening and triggered by abnormal movement - Already on pain medication and muscle relaxants, limited other options for him today  Reassurance given based on his history reported to me, seems inconsistent with hernia at this time, most likely muscle strain  Plan - Continue current medication management for pain as he is - Continue muscle relaxants - May try topical muscle rub and heating pad PRN for additional relief - Avoid re-injury is key issue for him to avoid bending or flexing in this area - Follow-up if return criteria - new issue swelling or radiation of pain, if concern for hernia need to return to care  No orders of the defined types were placed in this encounter.   Follow-up: - Return as needed within 4 weeks for R groin pain  Patient verbalizes understanding with the above medical recommendations including the limitation of remote medical advice.  Specific follow-up and call-back criteria were given for patient to follow-up or seek medical care more urgently if needed.   - Time spent in direct consultation with patient on phone: 9 minutes  Saralyn Pilar, DO [facility] 05/03/2019,  9:52 AM

## 2019-05-03 NOTE — Patient Instructions (Addendum)
AVS info given by phone. No mychart access. 

## 2019-05-08 DIAGNOSIS — G4733 Obstructive sleep apnea (adult) (pediatric): Secondary | ICD-10-CM | POA: Diagnosis not present

## 2019-05-08 DIAGNOSIS — M5106 Intervertebral disc disorders with myelopathy, lumbar region: Secondary | ICD-10-CM | POA: Diagnosis not present

## 2019-05-08 DIAGNOSIS — F338 Other recurrent depressive disorders: Secondary | ICD-10-CM | POA: Diagnosis not present

## 2019-05-08 DIAGNOSIS — R498 Other voice and resonance disorders: Secondary | ICD-10-CM | POA: Diagnosis not present

## 2019-05-08 DIAGNOSIS — M5136 Other intervertebral disc degeneration, lumbar region: Secondary | ICD-10-CM | POA: Diagnosis not present

## 2019-05-08 DIAGNOSIS — M1990 Unspecified osteoarthritis, unspecified site: Secondary | ICD-10-CM | POA: Diagnosis not present

## 2019-05-08 DIAGNOSIS — M47816 Spondylosis without myelopathy or radiculopathy, lumbar region: Secondary | ICD-10-CM | POA: Diagnosis not present

## 2019-05-08 DIAGNOSIS — M5126 Other intervertebral disc displacement, lumbar region: Secondary | ICD-10-CM | POA: Diagnosis not present

## 2019-05-08 DIAGNOSIS — G894 Chronic pain syndrome: Secondary | ICD-10-CM | POA: Diagnosis not present

## 2019-05-08 DIAGNOSIS — I10 Essential (primary) hypertension: Secondary | ICD-10-CM | POA: Diagnosis not present

## 2019-05-08 DIAGNOSIS — I69354 Hemiplegia and hemiparesis following cerebral infarction affecting left non-dominant side: Secondary | ICD-10-CM | POA: Diagnosis not present

## 2019-05-10 DIAGNOSIS — I69354 Hemiplegia and hemiparesis following cerebral infarction affecting left non-dominant side: Secondary | ICD-10-CM | POA: Diagnosis not present

## 2019-05-10 DIAGNOSIS — R498 Other voice and resonance disorders: Secondary | ICD-10-CM | POA: Diagnosis not present

## 2019-05-10 DIAGNOSIS — G4733 Obstructive sleep apnea (adult) (pediatric): Secondary | ICD-10-CM | POA: Diagnosis not present

## 2019-05-10 DIAGNOSIS — F338 Other recurrent depressive disorders: Secondary | ICD-10-CM | POA: Diagnosis not present

## 2019-05-10 DIAGNOSIS — G894 Chronic pain syndrome: Secondary | ICD-10-CM | POA: Diagnosis not present

## 2019-05-10 DIAGNOSIS — I10 Essential (primary) hypertension: Secondary | ICD-10-CM | POA: Diagnosis not present

## 2019-05-10 DIAGNOSIS — M1990 Unspecified osteoarthritis, unspecified site: Secondary | ICD-10-CM | POA: Diagnosis not present

## 2019-05-10 DIAGNOSIS — M5106 Intervertebral disc disorders with myelopathy, lumbar region: Secondary | ICD-10-CM | POA: Diagnosis not present

## 2019-05-10 DIAGNOSIS — M5136 Other intervertebral disc degeneration, lumbar region: Secondary | ICD-10-CM | POA: Diagnosis not present

## 2019-05-14 ENCOUNTER — Other Ambulatory Visit: Payer: Self-pay | Admitting: Family Medicine

## 2019-05-14 DIAGNOSIS — G894 Chronic pain syndrome: Secondary | ICD-10-CM | POA: Diagnosis not present

## 2019-05-14 DIAGNOSIS — F338 Other recurrent depressive disorders: Secondary | ICD-10-CM | POA: Diagnosis not present

## 2019-05-14 DIAGNOSIS — M5106 Intervertebral disc disorders with myelopathy, lumbar region: Secondary | ICD-10-CM | POA: Diagnosis not present

## 2019-05-14 DIAGNOSIS — I6930 Unspecified sequelae of cerebral infarction: Secondary | ICD-10-CM

## 2019-05-14 DIAGNOSIS — G4733 Obstructive sleep apnea (adult) (pediatric): Secondary | ICD-10-CM | POA: Diagnosis not present

## 2019-05-14 DIAGNOSIS — M5136 Other intervertebral disc degeneration, lumbar region: Secondary | ICD-10-CM | POA: Diagnosis not present

## 2019-05-14 DIAGNOSIS — I10 Essential (primary) hypertension: Secondary | ICD-10-CM | POA: Diagnosis not present

## 2019-05-14 DIAGNOSIS — I69354 Hemiplegia and hemiparesis following cerebral infarction affecting left non-dominant side: Secondary | ICD-10-CM | POA: Diagnosis not present

## 2019-05-14 DIAGNOSIS — M1990 Unspecified osteoarthritis, unspecified site: Secondary | ICD-10-CM | POA: Diagnosis not present

## 2019-05-14 DIAGNOSIS — R498 Other voice and resonance disorders: Secondary | ICD-10-CM | POA: Diagnosis not present

## 2019-05-14 NOTE — Telephone Encounter (Signed)
Pt .called request refill on plavix called into  [facility]

## 2019-05-15 MED ORDER — CLOPIDOGREL BISULFATE 75 MG PO TABS: 75.0000 mg | ORAL_TABLET | Freq: Every day | ORAL | 1 refills | Status: DC

## 2019-05-16 DIAGNOSIS — I69354 Hemiplegia and hemiparesis following cerebral infarction affecting left non-dominant side: Secondary | ICD-10-CM | POA: Diagnosis not present

## 2019-05-16 DIAGNOSIS — M5106 Intervertebral disc disorders with myelopathy, lumbar region: Secondary | ICD-10-CM | POA: Diagnosis not present

## 2019-05-16 DIAGNOSIS — M1990 Unspecified osteoarthritis, unspecified site: Secondary | ICD-10-CM | POA: Diagnosis not present

## 2019-05-16 DIAGNOSIS — F338 Other recurrent depressive disorders: Secondary | ICD-10-CM | POA: Diagnosis not present

## 2019-05-16 DIAGNOSIS — I10 Essential (primary) hypertension: Secondary | ICD-10-CM | POA: Diagnosis not present

## 2019-05-16 DIAGNOSIS — G894 Chronic pain syndrome: Secondary | ICD-10-CM | POA: Diagnosis not present

## 2019-05-16 DIAGNOSIS — G4733 Obstructive sleep apnea (adult) (pediatric): Secondary | ICD-10-CM | POA: Diagnosis not present

## 2019-05-16 DIAGNOSIS — M5136 Other intervertebral disc degeneration, lumbar region: Secondary | ICD-10-CM | POA: Diagnosis not present

## 2019-05-16 DIAGNOSIS — R498 Other voice and resonance disorders: Secondary | ICD-10-CM | POA: Diagnosis not present

## 2019-05-22 DIAGNOSIS — G894 Chronic pain syndrome: Secondary | ICD-10-CM | POA: Diagnosis not present

## 2019-05-22 DIAGNOSIS — M5136 Other intervertebral disc degeneration, lumbar region: Secondary | ICD-10-CM | POA: Diagnosis not present

## 2019-05-22 DIAGNOSIS — I69354 Hemiplegia and hemiparesis following cerebral infarction affecting left non-dominant side: Secondary | ICD-10-CM | POA: Diagnosis not present

## 2019-05-22 DIAGNOSIS — F338 Other recurrent depressive disorders: Secondary | ICD-10-CM | POA: Diagnosis not present

## 2019-05-22 DIAGNOSIS — M1990 Unspecified osteoarthritis, unspecified site: Secondary | ICD-10-CM | POA: Diagnosis not present

## 2019-05-22 DIAGNOSIS — R498 Other voice and resonance disorders: Secondary | ICD-10-CM | POA: Diagnosis not present

## 2019-05-22 DIAGNOSIS — G4733 Obstructive sleep apnea (adult) (pediatric): Secondary | ICD-10-CM | POA: Diagnosis not present

## 2019-05-22 DIAGNOSIS — I10 Essential (primary) hypertension: Secondary | ICD-10-CM | POA: Diagnosis not present

## 2019-05-22 DIAGNOSIS — M5106 Intervertebral disc disorders with myelopathy, lumbar region: Secondary | ICD-10-CM | POA: Diagnosis not present

## 2019-05-23 ENCOUNTER — Telehealth: Payer: Self-pay

## 2019-05-23 DIAGNOSIS — M5136 Other intervertebral disc degeneration, lumbar region: Secondary | ICD-10-CM | POA: Diagnosis not present

## 2019-05-23 DIAGNOSIS — I69354 Hemiplegia and hemiparesis following cerebral infarction affecting left non-dominant side: Secondary | ICD-10-CM | POA: Diagnosis not present

## 2019-05-23 DIAGNOSIS — R498 Other voice and resonance disorders: Secondary | ICD-10-CM | POA: Diagnosis not present

## 2019-05-23 DIAGNOSIS — G894 Chronic pain syndrome: Secondary | ICD-10-CM | POA: Diagnosis not present

## 2019-05-23 DIAGNOSIS — M5106 Intervertebral disc disorders with myelopathy, lumbar region: Secondary | ICD-10-CM | POA: Diagnosis not present

## 2019-05-23 DIAGNOSIS — F338 Other recurrent depressive disorders: Secondary | ICD-10-CM | POA: Diagnosis not present

## 2019-05-23 DIAGNOSIS — M1990 Unspecified osteoarthritis, unspecified site: Secondary | ICD-10-CM | POA: Diagnosis not present

## 2019-05-23 DIAGNOSIS — G4733 Obstructive sleep apnea (adult) (pediatric): Secondary | ICD-10-CM | POA: Diagnosis not present

## 2019-05-23 DIAGNOSIS — I10 Essential (primary) hypertension: Secondary | ICD-10-CM | POA: Diagnosis not present

## 2019-05-23 NOTE — Telephone Encounter (Signed)
Mark Wells called with some concerns after recent home visit. She said multiple falls were reported to her by the family members. She did notice a abrasion on the patient head, no other visual injuries. She reports the pt health has declined a lot over time. The family complains of frequent urinary and bowel accidents. She's unsure if it's incontinence or inability  to get to the bathroom. The pt is unable to get up without the fathers assistants. She states the father is requesting that he placed in a long-term facility. She said the father seems to be burned out trying to take care of him and his wife.   Mark Wells requesting a order for a tub transfer bench and bed rail. She states most of the patients falls are coming from him slipping off his bed. He also needs a referral for a Child psychotherapist. During his home visit yesterday Mark Wells, social worker from DSS called and ask to speak with her. The social worker requested a home evaluation to be done by a Child psychotherapist at [facility].   Mark Wells [phone]

## 2019-05-23 NOTE — Telephone Encounter (Signed)
Patient has had chronic debilitation issues for a while now, and we have attempted to increase his level of care.  He has had issues with limited housing resources in the past. And limited caregiver support.  He was admitted to [facility] back in March 2020. However he was discharged by that facility early, I do not have details on the specifics of his early discharge.  He has been followed by various Home Health agency.  We have attempted to get him long term care in past but was unsuccessful.  I would agree with this request to have Social Worker from [employer] to evaluate the home and provide assistance in escalating his care to any long term care facility, as it sounds like this is what he needs.  I can provide a written order for the Tub Transfer Bench and Bed Rail as well, we will need to determine where to send these orders to. - Dx spastic hemiplegia of L side due to CVA (ICD: N36.144)  Can you contact [facility] to determine if they can follow through on this social worker request and how we can order these supplies?  ------------------  Lastly - I will plan on referring him to our Chronic Care Management Team through [facility] in next 1-2 days to help coordinate his care moving forward.  Saralyn Pilar, DO [facility] 05/23/2019, 1:26 PM

## 2019-05-24 NOTE — Telephone Encounter (Signed)
I spoke with Vernona Rieger and she informed me that they use [facility] for personal care equipment. She will call be back with the fax number so I can fax it over directly to them.

## 2019-05-28 DIAGNOSIS — M5136 Other intervertebral disc degeneration, lumbar region: Secondary | ICD-10-CM | POA: Diagnosis not present

## 2019-05-28 DIAGNOSIS — I69354 Hemiplegia and hemiparesis following cerebral infarction affecting left non-dominant side: Secondary | ICD-10-CM | POA: Diagnosis not present

## 2019-05-28 DIAGNOSIS — G4733 Obstructive sleep apnea (adult) (pediatric): Secondary | ICD-10-CM | POA: Diagnosis not present

## 2019-05-28 DIAGNOSIS — F338 Other recurrent depressive disorders: Secondary | ICD-10-CM | POA: Diagnosis not present

## 2019-05-28 DIAGNOSIS — M5106 Intervertebral disc disorders with myelopathy, lumbar region: Secondary | ICD-10-CM | POA: Diagnosis not present

## 2019-05-28 DIAGNOSIS — R498 Other voice and resonance disorders: Secondary | ICD-10-CM | POA: Diagnosis not present

## 2019-05-28 DIAGNOSIS — M1990 Unspecified osteoarthritis, unspecified site: Secondary | ICD-10-CM | POA: Diagnosis not present

## 2019-05-28 DIAGNOSIS — G894 Chronic pain syndrome: Secondary | ICD-10-CM | POA: Diagnosis not present

## 2019-05-28 DIAGNOSIS — I10 Essential (primary) hypertension: Secondary | ICD-10-CM | POA: Diagnosis not present

## 2019-05-30 ENCOUNTER — Other Ambulatory Visit: Payer: Self-pay

## 2019-05-30 ENCOUNTER — Emergency Department
Admission: EM | Admit: 2019-05-30 | Discharge: 2019-05-30 | Disposition: A | Discharge status: Home / Self Care | Payer: Medicare HMO | Attending: Emergency Medicine | Admitting: Emergency Medicine

## 2019-05-30 ENCOUNTER — Emergency Department: Payer: Medicare HMO

## 2019-05-30 DIAGNOSIS — M79642 Pain in left hand: Secondary | ICD-10-CM | POA: Insufficient documentation

## 2019-05-30 DIAGNOSIS — Z8673 Personal history of transient ischemic attack (TIA), and cerebral infarction without residual deficits: Secondary | ICD-10-CM | POA: Insufficient documentation

## 2019-05-30 DIAGNOSIS — F17210 Nicotine dependence, cigarettes, uncomplicated: Secondary | ICD-10-CM | POA: Diagnosis not present

## 2019-05-30 DIAGNOSIS — W19XXXA Unspecified fall, initial encounter: Secondary | ICD-10-CM

## 2019-05-30 DIAGNOSIS — Z79899 Other long term (current) drug therapy: Secondary | ICD-10-CM | POA: Insufficient documentation

## 2019-05-30 DIAGNOSIS — Z7902 Long term (current) use of antithrombotics/antiplatelets: Secondary | ICD-10-CM | POA: Insufficient documentation

## 2019-05-30 DIAGNOSIS — W010XXA Fall on same level from slipping, tripping and stumbling without subsequent striking against object, initial encounter: Secondary | ICD-10-CM | POA: Insufficient documentation

## 2019-05-30 DIAGNOSIS — R5381 Other malaise: Secondary | ICD-10-CM | POA: Diagnosis not present

## 2019-05-30 MED ORDER — ACETAMINOPHEN 500 MG PO TABS
1000.0000 mg | ORAL_TABLET | Freq: Once | ORAL | Status: AC
  Administered 2019-05-30: 1000 mg via ORAL
  Filled 2019-05-30: qty 2

## 2019-05-30 MED ORDER — OXYCODONE HCL 5 MG PO TABS
5.0000 mg | ORAL_TABLET | Freq: Once | ORAL | Status: AC
  Administered 2019-05-30: 07:00:00 5 mg via ORAL
  Filled 2019-05-30: qty 1

## 2019-05-30 NOTE — ED Notes (Signed)
This RN spoke with patient, pt requesting a cab to take him home instead of EMS as originally discussed. Pt states family/friends/cabs is his normal mode of transportation however he is unable to get family/friends to come and pick him up. Pt initially asking for hospital to pay for taxi, this RN explained that taxi vouchers were not offered by hospital. This RN discussed with Consulting civil engineer, that patient was unable to find ride, initially unable to pay for cab, per [employer] RN EMS transport. This RN called back to bedside by patient, pt states he has money to pay for a cab. This RN asked patient how he would get to house from cab, pt states his brother would be there to assist him from the cab to the house, this RN ensured that patient would be able to pay for the cab, pt states he would be able to pay for cab. Discussed with Herbert Seta, charge RN and [place], First RN who are in agreement with plan as patient has assistance when he gets home, and uses cabs as forms of transportation in daily life.

## 2019-05-30 NOTE — ED Provider Notes (Signed)
[facility] Emergency Department Provider Note  ____________________________________________  Time seen: Approximately 6:23 AM  I have reviewed the triage vital signs and the nursing notes.   HISTORY  Chief Complaint Fall   HPI Mark Wells is a 48 y.o. male with a history as listed below including CVA on Plavix with left-sided hemiparesis who presents for evaluation of a fall.  Patient reports that he was at his girlfriend's house walking with his walker  when he lost his balance and fell.  He denies head trauma or LOC. He reports that he fell on top of his left hand. He is complaining of diffuse left hand pain which is worse with movement.  He denies neck pain or back pain, extremity pain, chest pain or shortness of breath.  He reports that the fall was mechanical in nature.  Patient did not want to come to the emergency room for evaluation but came per request of his mother to be checked out.  Past Medical History:  Diagnosis Date  . Anemia   . Anxiety   . Chronic back pain   . Depression   . Hemiplegia affecting left nondominant side (HCC)   . Hyperlipidemia   . Mood disorder (HCC)   . Personality disorder (HCC)   . Restless leg syndrome   . Sleep apnea   . Stroke [facility](HCC)     Patient Active Problem List   Diagnosis Date Noted  . OSA on CPAP 12/09/2018  . Chronic pain syndrome 12/09/2018  . Major depression, recurrent, chronic (HCC) 12/09/2018  . Insomnia 12/09/2018  . Spastic hemiplegia of left dominant side as late effect of cerebral infarction (HCC) 12/07/2018  . Painful orthopaedic hardware (HCC) 09/03/2018  . BACK PAIN 01/07/2011  . ADVERSE DRUG REACTION 01/01/2011  . Essential hypertension 12/28/2010  . LOW BACK PAIN SYNDROME, SEVERE 12/28/2010  . OTHER URINARY INCONTINENCE 12/28/2010    Past Surgical History:  Procedure Laterality Date  . BACK SURGERY      Prior to Admission medications   Medication Sig Start Date End Date  Taking? Authorizing Provider  amLODipine (NORVASC) 5 MG tablet Take 1 tablet (5 mg total) by mouth daily. 02/26/19   Karamalegos, Netta NeatAlexander J, DO  atorvastatin (LIPITOR) 40 MG tablet Take 1 tablet (40 mg total) by mouth every evening. 05/03/19   Karamalegos, Netta NeatAlexander J, DO  baclofen (LIORESAL) 20 MG tablet Take 20 mg by mouth 4 (four) times daily. 03/21/19   [provider]  chlorthalidone (HYGROTON) 25 MG tablet Take 1 tablet (25 mg total) by mouth daily. 02/26/19   Karamalegos, Netta NeatAlexander J, DO  clopidogrel (PLAVIX) 75 MG tablet Take 1 tablet (75 mg total) by mouth daily. 05/15/19   Karamalegos, Netta NeatAlexander J, DO  losartan (COZAAR) 100 MG tablet Take 1 tablet (100 mg total) by mouth daily. 02/26/19   Karamalegos, Netta NeatAlexander J, DO  meclizine (ANTIVERT) 25 MG tablet Take 1 tablet (25 mg total) by mouth 3 (three) times daily as needed for dizziness. 02/26/19   Karamalegos, Netta NeatAlexander J, DO  methocarbamol (ROBAXIN) 500 MG tablet Take 1 tablet (500 mg total) by mouth every 6 (six) hours as needed for muscle spasms. 02/26/19   Karamalegos, Netta NeatAlexander J, DO  oxyCODONE-acetaminophen (PERCOCET) 7.5-325 MG tablet Take 1 tablet by mouth 3 (three) times daily as needed for severe pain.    [provider]  potassium chloride (K-DUR) 10 MEQ tablet Take 1 tablet (10 mEq total) by mouth 2 (two) times daily. Patient not taking: Reported on  05/03/2019 02/26/19   Karamalegos, Netta Neat, DO  pregabalin (LYRICA) 300 MG capsule Take 300 mg by mouth 2 (two) times daily.     [provider]    Allergies Aspirin; Ibuprofen; and Shellfish allergy  No family history on file.  Social History Social History   Tobacco Use  . Smoking status: Current Every Day Smoker    Packs/day: 0.50    Types: Cigarettes  . Smokeless tobacco: Current User  Substance Use Topics  . Alcohol use: No  . Drug use: No    Review of Systems Constitutional: Negative for fever. Eyes: Negative for visual changes. ENT: Negative for  facial injury or neck injury Cardiovascular: Negative for chest injury. Respiratory: Negative for shortness of breath. Negative for chest wall injury. Gastrointestinal: Negative for abdominal pain or injury. Genitourinary: Negative for dysuria. Musculoskeletal: Negative for back injury, + L hand pain Skin: Negative for laceration/abrasions. Neurological: Negative for head injury.   ____________________________________________   PHYSICAL EXAM:  VITAL SIGNS: ED Triage Vitals  Enc Vitals Group     BP 05/30/19 0233 (!) 165/92     Pulse Rate 05/30/19 0233 98     Resp 05/30/19 0233 16     Temp 05/30/19 0233 98.5 F (36.9 C)     Temp Source 05/30/19 0233 Oral     SpO2 05/30/19 0233 99 %     Weight 05/30/19 0231 199 lb (90.3 kg)     Height 05/30/19 0231  (1.676 m)     Head Circumference --      Peak Flow --      Pain Score 05/30/19 0231 0     Pain Loc --      Pain Edu? --      Excl. in GC? --    Constitutional: Alert and oriented. No acute distress. Does not appear intoxicated. HEENT Head: Normocephalic and atraumatic. Face: No facial bony tenderness. Stable midface Ears: No hemotympanum bilaterally. No Battle sign Eyes: No eye injury. PERRL. No raccoon eyes Nose: Nontender. No epistaxis. No rhinorrhea Mouth/Throat: Mucous membranes are moist. No oropharyngeal blood. No dental injury. Airway patent without stridor. Normal voice. Neck: no C-collar in place. No midline c-spine tenderness.  Cardiovascular: Normal rate, regular rhythm. Normal and symmetric distal pulses are present in all extremities. Pulmonary/Chest: Chest wall is stable and nontender to palpation/compression. Normal respiratory effort. Breath sounds are normal. No crepitus.  Abdominal: Soft, nontender, non distended. Musculoskeletal: Nontender with normal full range of motion in all extremities. No deformities. No thoracic or lumbar midline spinal tenderness. Pelvis is stable. Skin: Skin is warm, dry and  intact. No abrasions or contutions. Psychiatric: Speech and behavior are appropriate. Neurological: Normal speech and language.  Left-sided hemiparesis  Glascow Coma Score: 4 - Opens eyes on own 6 - Follows simple motor commands 5 - Alert and oriented GCS: 15   ____________________________________________   LABS (all labs ordered are listed, but only abnormal results are displayed)  Labs Reviewed - No data to display ____________________________________________  EKG  none  ____________________________________________  RADIOLOGY  XR: PND   ____________________________________________   PROCEDURES  Procedure(s) performed: None Procedures Critical Care performed:  None ____________________________________________   INITIAL IMPRESSION / ASSESSMENT AND PLAN / ED COURSE   48 y.o. male with a history as listed below including CVA on Plavix with left-sided hemiparesis who presents for evaluation of a mechanical fall with pain in his left hand.  No tenderness palpation of the hand or wrist, no obvious swelling or bruising.  Patient denies head trauma or LOC, denies any other pain and only came to the emergency room because his mother requested him to be evaluated.  Will give Tylenol and 1 oxycodone for the pain.  Will get an x-ray of his hand    _________________________ 7:12 AM on 05/30/2019 -----------------------------------------  X-ray pending.  Care transferred to Dr. Mayford Knife.   As part of my medical decision making, I reviewed the following data within the electronic MEDICAL RECORD NUMBER Nursing notes reviewed and incorporated, Old chart reviewed, Notes from prior ED visits and  Controlled Substance Database    Pertinent labs & imaging results that were available during my care of the patient were reviewed by me and considered in my medical decision making (see chart for details).    ____________________________________________   FINAL CLINICAL IMPRESSION(S)  / ED DIAGNOSES  Final diagnoses:  Fall, initial encounter  Left hand pain      NEW MEDICATIONS STARTED DURING THIS VISIT:  ED Discharge Orders    None       Note:  This document was prepared using Dragon voice recognition software and may include unintentional dictation errors.    Don Perking, [place], MD 05/30/19 [phone]

## 2019-05-30 NOTE — Discharge Instructions (Signed)

## 2019-05-30 NOTE — ED Triage Notes (Signed)
Pt arrives to ED via [facility] from home with c/o fall r/t recent CVA 2 years ago with left-sided deficits. Pt states he was "at this girl's house and was walking without paying attention, tripped, and lost my balance". Pt denies any head injury or LOC. Pt denies any c/o pain at this time and states "my mother wanted to me come here and get checked out". Pt is A&O, in NAD; RR even, regular, and unlabored.

## 2019-05-30 NOTE — ED Notes (Signed)
Pt called this RN back to bedside to ask to sit outside, due to patient needing assistance getting into cab and safety concerns regarding Covid and patients sitting in lobby, this RN explained patient to wait in room until cab arrives. Pt states understanding.

## 2019-05-31 ENCOUNTER — Telehealth: Payer: Self-pay | Admitting: Family Medicine

## 2019-05-31 DIAGNOSIS — I69354 Hemiplegia and hemiparesis following cerebral infarction affecting left non-dominant side: Secondary | ICD-10-CM | POA: Diagnosis not present

## 2019-05-31 DIAGNOSIS — I69352 Hemiplegia and hemiparesis following cerebral infarction affecting left dominant side: Secondary | ICD-10-CM | POA: Diagnosis not present

## 2019-05-31 DIAGNOSIS — M1990 Unspecified osteoarthritis, unspecified site: Secondary | ICD-10-CM | POA: Diagnosis not present

## 2019-05-31 DIAGNOSIS — G894 Chronic pain syndrome: Secondary | ICD-10-CM | POA: Diagnosis not present

## 2019-05-31 DIAGNOSIS — R498 Other voice and resonance disorders: Secondary | ICD-10-CM | POA: Diagnosis not present

## 2019-05-31 DIAGNOSIS — G4733 Obstructive sleep apnea (adult) (pediatric): Secondary | ICD-10-CM | POA: Diagnosis not present

## 2019-05-31 DIAGNOSIS — I10 Essential (primary) hypertension: Secondary | ICD-10-CM | POA: Diagnosis not present

## 2019-05-31 DIAGNOSIS — M5136 Other intervertebral disc degeneration, lumbar region: Secondary | ICD-10-CM | POA: Diagnosis not present

## 2019-05-31 DIAGNOSIS — F338 Other recurrent depressive disorders: Secondary | ICD-10-CM | POA: Diagnosis not present

## 2019-05-31 DIAGNOSIS — M5106 Intervertebral disc disorders with myelopathy, lumbar region: Secondary | ICD-10-CM | POA: Diagnosis not present

## 2019-05-31 NOTE — Telephone Encounter (Signed)
Received Epic EHR staff message from Ottie Glazier RN MSN regarding this patient's rehab potential status. Here is copy of message for future reference.  I contacted Mark Wells dob 09/10/1971, by phone to discuss his rehab needs. Noted his multiple falls and ER visits. Patient's CVA was two years ago and recently Discharged from [facility] 02/2019. Patient is not a candidate for our program two years after CVA. States he has unstable housing living with his parents and plans to marry next month after his divorce is final. [employer] Medicare or any Medicare advantage plan will not approve a hospital rehab admission from home. There is no medical need for hospitalization. I recommend ALF or extensive outpatient rehab. He states he has Home health Care now. If you have any questions, please call me.   Ottie Glazier RN MSN  Admissions [facility]  Inpatient Acute Rehabilitation  [phone]

## 2019-06-01 DIAGNOSIS — M5136 Other intervertebral disc degeneration, lumbar region: Secondary | ICD-10-CM | POA: Diagnosis not present

## 2019-06-01 DIAGNOSIS — I69354 Hemiplegia and hemiparesis following cerebral infarction affecting left non-dominant side: Secondary | ICD-10-CM | POA: Diagnosis not present

## 2019-06-01 DIAGNOSIS — I10 Essential (primary) hypertension: Secondary | ICD-10-CM | POA: Diagnosis not present

## 2019-06-01 DIAGNOSIS — M5106 Intervertebral disc disorders with myelopathy, lumbar region: Secondary | ICD-10-CM | POA: Diagnosis not present

## 2019-06-01 DIAGNOSIS — R498 Other voice and resonance disorders: Secondary | ICD-10-CM | POA: Diagnosis not present

## 2019-06-01 DIAGNOSIS — F338 Other recurrent depressive disorders: Secondary | ICD-10-CM | POA: Diagnosis not present

## 2019-06-01 DIAGNOSIS — G4733 Obstructive sleep apnea (adult) (pediatric): Secondary | ICD-10-CM | POA: Diagnosis not present

## 2019-06-01 DIAGNOSIS — G894 Chronic pain syndrome: Secondary | ICD-10-CM | POA: Diagnosis not present

## 2019-06-01 DIAGNOSIS — M1990 Unspecified osteoarthritis, unspecified site: Secondary | ICD-10-CM | POA: Diagnosis not present

## 2019-06-03 DIAGNOSIS — I10 Essential (primary) hypertension: Secondary | ICD-10-CM | POA: Diagnosis not present

## 2019-06-03 DIAGNOSIS — R498 Other voice and resonance disorders: Secondary | ICD-10-CM | POA: Diagnosis not present

## 2019-06-03 DIAGNOSIS — G4733 Obstructive sleep apnea (adult) (pediatric): Secondary | ICD-10-CM | POA: Diagnosis not present

## 2019-06-03 DIAGNOSIS — M5136 Other intervertebral disc degeneration, lumbar region: Secondary | ICD-10-CM | POA: Diagnosis not present

## 2019-06-03 DIAGNOSIS — F338 Other recurrent depressive disorders: Secondary | ICD-10-CM | POA: Diagnosis not present

## 2019-06-03 DIAGNOSIS — M1990 Unspecified osteoarthritis, unspecified site: Secondary | ICD-10-CM | POA: Diagnosis not present

## 2019-06-03 DIAGNOSIS — I69354 Hemiplegia and hemiparesis following cerebral infarction affecting left non-dominant side: Secondary | ICD-10-CM | POA: Diagnosis not present

## 2019-06-03 DIAGNOSIS — M5106 Intervertebral disc disorders with myelopathy, lumbar region: Secondary | ICD-10-CM | POA: Diagnosis not present

## 2019-06-03 DIAGNOSIS — G894 Chronic pain syndrome: Secondary | ICD-10-CM | POA: Diagnosis not present

## 2019-06-04 DIAGNOSIS — I10 Essential (primary) hypertension: Secondary | ICD-10-CM | POA: Diagnosis not present

## 2019-06-04 DIAGNOSIS — R498 Other voice and resonance disorders: Secondary | ICD-10-CM | POA: Diagnosis not present

## 2019-06-04 DIAGNOSIS — M5136 Other intervertebral disc degeneration, lumbar region: Secondary | ICD-10-CM | POA: Diagnosis not present

## 2019-06-04 DIAGNOSIS — M5106 Intervertebral disc disorders with myelopathy, lumbar region: Secondary | ICD-10-CM | POA: Diagnosis not present

## 2019-06-04 DIAGNOSIS — I69354 Hemiplegia and hemiparesis following cerebral infarction affecting left non-dominant side: Secondary | ICD-10-CM | POA: Diagnosis not present

## 2019-06-04 DIAGNOSIS — G4733 Obstructive sleep apnea (adult) (pediatric): Secondary | ICD-10-CM | POA: Diagnosis not present

## 2019-06-04 DIAGNOSIS — M1990 Unspecified osteoarthritis, unspecified site: Secondary | ICD-10-CM | POA: Diagnosis not present

## 2019-06-04 DIAGNOSIS — F338 Other recurrent depressive disorders: Secondary | ICD-10-CM | POA: Diagnosis not present

## 2019-06-04 DIAGNOSIS — G894 Chronic pain syndrome: Secondary | ICD-10-CM | POA: Diagnosis not present

## 2019-06-10 ENCOUNTER — Other Ambulatory Visit: Payer: Self-pay

## 2019-06-10 ENCOUNTER — Encounter: Payer: Self-pay | Admitting: Family Medicine

## 2019-06-10 ENCOUNTER — Ambulatory Visit (INDEPENDENT_AMBULATORY_CARE_PROVIDER_SITE_OTHER): Payer: Medicare HMO | Admitting: Family Medicine

## 2019-06-10 VITALS — Ht 66.0 in | Wt 200.0 lb

## 2019-06-10 DIAGNOSIS — I69352 Hemiplegia and hemiparesis following cerebral infarction affecting left dominant side: Secondary | ICD-10-CM | POA: Diagnosis not present

## 2019-06-10 DIAGNOSIS — E782 Mixed hyperlipidemia: Secondary | ICD-10-CM

## 2019-06-10 DIAGNOSIS — I6930 Unspecified sequelae of cerebral infarction: Secondary | ICD-10-CM

## 2019-06-10 MED ORDER — ATORVASTATIN CALCIUM 40 MG PO TABS: 40.0000 mg | ORAL_TABLET | Freq: Every evening | ORAL | 1 refills | Status: DC

## 2019-06-10 NOTE — Patient Instructions (Addendum)
AVS info given by phone. No mychart access. 

## 2019-06-10 NOTE — Progress Notes (Signed)
Virtual Visit via Telephone The purpose of this virtual visit is to provide medical care while limiting exposure to the novel coronavirus (COVID19) for both patient and office staff.  Consent was obtained for phone visit:  Yes.   Answered questions that patient had about telehealth interaction:  Yes.   I discussed the limitations, risks, security and privacy concerns of performing an evaluation and management service by telephone. I also discussed with the patient that there may be a patient responsible charge related to this service. The patient expressed understanding and agreed to proceed.  Patient Location: Home [facility]  ---------------------------------------------------------------------- Chief Complaint  Patient presents with  . History of CVA Stroke with residual deficit, hemiplegia Left    left sided weakness   S: Reviewed CMA documentation. I have called patient and gathered additional HPI as follows:  HYPERLIPIDEMIA: - Reports no concerns except needs refill - Currently taking Atorvastatin 40mg  daily, tolerating well without side effects or myalgias  Power Wheelchair Mobility Assessment  - Patient has difficulty with reaching different areas within his house such as kitchen and bathroom and difficulty getting out of house and mobility to and around stores, as he is limited in mobility, a power wheelchair would help him access different areas of her house more frequently and perform ADLs such as meal prep and cooking in kitchen and toileting and shopping at stores and getting to appointments.  - Unable to use cane or walker regularly due to LEFT upper extremity weakness causing lack of support for these devices, also weakness in LEFT lower extremity  - Unable to use manual wheelchair due to reduced upper and lower body strength and limited range of motion, due to weakness in Left upper extremity.  - Unable to use a scooter (power  device) due to limited ability to transfer on and off scooter, due to muscle weakness.   Denies any fevers, chills, sweats, body ache, cough, shortness of breath, sinus pain or pressure, headache, abdominal pain, diarrhea  Past Medical History:  Diagnosis Date  . Anemia   . Anxiety   . Chronic back pain   . Depression   . Hemiplegia affecting left nondominant side (HCC)   . Hyperlipidemia   . Mood disorder (HCC)   . Personality disorder (HCC)   . Restless leg syndrome   . Sleep apnea   . Stroke [facility](HCC)    Social History   Tobacco Use  . Smoking status: Current Every Day Smoker    Packs/day: 0.50    Types: Cigarettes  . Smokeless tobacco: Current User  Substance Use Topics  . Alcohol use: No  . Drug use: No    Current Outpatient Medications:  .  amLODipine (NORVASC) 5 MG tablet, Take 1 tablet (5 mg total) by mouth daily., Disp: 90 tablet, Rfl: 1 .  atorvastatin (LIPITOR) 40 MG tablet, Take 1 tablet (40 mg total) by mouth every evening., Disp: 90 tablet, Rfl: 1 .  baclofen (LIORESAL) 20 MG tablet, Take 20 mg by mouth 4 (four) times daily., Disp: , Rfl:  .  chlorthalidone (HYGROTON) 25 MG tablet, Take 1 tablet (25 mg total) by mouth daily., Disp: 90 tablet, Rfl: 1 .  clopidogrel (PLAVIX) 75 MG tablet, Take 1 tablet (75 mg total) by mouth daily., Disp: 90 tablet, Rfl: 1 .  losartan (COZAAR) 100 MG tablet, Take 1 tablet (100 mg total) by mouth daily., Disp: 90 tablet, Rfl: 1 .  meclizine (ANTIVERT) 25 MG tablet, Take 1 tablet (  25 mg total) by mouth 3 (three) times daily as needed for dizziness., Disp: 90 tablet, Rfl: 1 .  methocarbamol (ROBAXIN) 500 MG tablet, Take 1 tablet (500 mg total) by mouth every 6 (six) hours as needed for muscle spasms., Disp: 120 tablet, Rfl: 2 .  oxyCODONE-acetaminophen (PERCOCET) 7.5-325 MG tablet, Take 1 tablet by mouth 3 (three) times daily as needed for severe pain., Disp: , Rfl:  .  potassium chloride (K-DUR) 10 MEQ tablet, Take 1 tablet (10 mEq  total) by mouth 2 (two) times daily., Disp: 180 tablet, Rfl: 1 .  pregabalin (LYRICA) 300 MG capsule, Take 300 mg by mouth 2 (two) times daily. , Disp: , Rfl:  .  oxyCODONE-acetaminophen (PERCOCET) 7.5-325 MG tablet, oxycodone-acetaminophen 7.5 mg-325 mg tablet  TAKE 1 TABLET BY MOUTH EVERY 8 HOURS AS NEEDED PAIN. DO NOT FILL BEFORE 04/29/2019, Disp: , Rfl:   [facility] 06/10/2019 05/03/2019  Decreased Interest 1 0  Down, Depressed, Hopeless 1 1  PHQ - 2 Score 2 1  Altered sleeping 1 0  Tired, decreased energy 1 0  Change in appetite 1 0  Feeling bad or failure about yourself  1 0  Trouble concentrating 1 0  Moving slowly or fidgety/restless 1 0  Suicidal thoughts 0 0  PHQ-9 Score 8 1  Difficult doing work/chores Somewhat difficult Not difficult at all    No flowsheet data found.  -------------------------------------------------------------------------- O:   No physical exam performed due to remote telephone encounter.  References for documented physical exam findings were from previous recent office visit note on file.  Patient is wheelchair bound. Chronically ill appearing but currently well  RIGHT Upper Extremity - Muscle Strength: intact 5/5 grip, slightly reduced 5/5 both biceps, triceps - Range of Motion: intact  LEFT Upper Extremity - Muscle Strength: reduced 3/5 grip, slightly reduced 4/5 both biceps, triceps - Range of Motion: Limited forward flexion and abduction, range behind back or internal rotation of shoulders  RIGHT Lower Extremity - Muscle Strength: intact 5/5 hip/knee flexion extension - Range of Motion: intact flexion, extension, rotation hip, knee  LEFT Lower Extremity - Muscle Strength: reduced 4/5 hip flexion and knee flexion extension - Range of Motion: mildly reduced left hip flexion and extension, mostly intact left knee flex/extend, slightly reduced left ankle dorsi/plantar flexion  Lab results reviewed.  Recent Results (from the  past 2160 hour(s))  CBC     Status: None   Collection Time: 04/13/19  2:31 AM  Result Value Ref Range   WBC 6.7 4.0 - 10.5 K/uL   RBC 4.66 4.22 - 5.81 MIL/uL   Hemoglobin 13.7 13.0 - 17.0 g/dL   HCT 40.941.4 81.139.0 - 91.452.0 %   MCV 88.8 80.0 - 100.0 fL   MCH 29.4 26.0 - 34.0 pg   MCHC 33.1 30.0 - 36.0 g/dL   RDW 78.213.2 95.611.5 - 21.315.5 %   Platelets 267 150 - 400 K/uL   nRBC 0.0 0.0 - 0.2 %    Comment: Performed at [facility], [address] [place], [place] [address]  Comprehensive metabolic panel     Status: Abnormal   Collection Time: 04/13/19  2:31 AM  Result Value Ref Range   Sodium 138 135 - 145 mmol/L   Potassium 2.8 (L) 3.5 - 5.1 mmol/L   Chloride 97 (L) 98 - 111 mmol/L   CO2 28 22 - 32 mmol/L   Glucose, Bld 92 70 - 99 mg/dL   BUN 11 6 - 20  mg/dL   Creatinine, Ser 1.610.75 0.61 - 1.24 mg/dL   Calcium 8.7 (L) 8.9 - 10.3 mg/dL   Total Protein 6.6 6.5 - 8.1 g/dL   Albumin 3.9 3.5 - 5.0 g/dL   AST 25 15 - 41 U/L   ALT 18 0 - 44 U/L   Alkaline Phosphatase 77 38 - 126 U/L   Total Bilirubin 0.9 0.3 - 1.2 mg/dL   GFR calc non Af Amer >60 >60 mL/min   GFR calc Af Amer >60 >60 mL/min   Anion gap 13 5 - 15    Comment: Performed at [facility], [address] [place], [place] [address]  Ethanol     Status: None   Collection Time: 04/13/19  2:31 AM  Result Value Ref Range   Alcohol, Ethyl (B) <10 <10 mg/dL    Comment: (NOTE) Lowest detectable limit for serum alcohol is 10 mg/dL. For medical purposes only. Performed at [facility], [address] [place], [place] [address]   Urine Drug Screen, Qualitative [facility] only)     Status: Abnormal   Collection Time: 04/13/19  2:57 AM  Result Value Ref Range   Tricyclic, Ur Screen NONE DETECTED NONE DETECTED   Amphetamines, Ur Screen NONE DETECTED NONE DETECTED   MDMA (Ecstasy)Ur Screen NONE DETECTED NONE DETECTED   Cocaine Metabolite,Ur [place] POSITIVE (A) NONE DETECTED   Opiate, Ur Screen NONE DETECTED NONE  DETECTED   Phencyclidine (PCP) Ur S NONE DETECTED NONE DETECTED   Cannabinoid 50 Ng, Ur [place] NONE DETECTED NONE DETECTED   Barbiturates, Ur Screen NONE DETECTED NONE DETECTED   Benzodiazepine, Ur Scrn NONE DETECTED NONE DETECTED   Methadone Scn, Ur NONE DETECTED NONE DETECTED    Comment: (NOTE) Tricyclics + metabolites, urine    Cutoff 1000 ng/mL Amphetamines + metabolites, urine  Cutoff 1000 ng/mL MDMA (Ecstasy), urine              Cutoff 500 ng/mL Cocaine Metabolite, urine          Cutoff 300 ng/mL Opiate + metabolites, urine        Cutoff 300 ng/mL Phencyclidine (PCP), urine         Cutoff 25 ng/mL Cannabinoid, urine                 Cutoff 50 ng/mL Barbiturates + metabolites, urine  Cutoff 200 ng/mL Benzodiazepine, urine              Cutoff 200 ng/mL Methadone, urine                   Cutoff 300 ng/mL The urine drug screen provides only a preliminary, unconfirmed analytical test result and should not be used for non-medical purposes. Clinical consideration and professional judgment should be applied to any positive drug screen result due to possible interfering substances. A more specific alternate chemical method must be used in order to obtain a confirmed analytical result. Gas chromatography / mass spectrometry (GC/MS) is the preferred confirmat ory method. Performed at [facility], [address] [place], [place] [address]     -------------------------------------------------------------------------- A&P:  Problem List Items Addressed This Visit    Spastic hemiplegia of left dominant side as late effect of cerebral infarction (HCC) - Primary    Other Visit Diagnoses    History of cerebrovascular accident (CVA) with residual deficit       Relevant Medications   atorvastatin (LIPITOR) 40 MG tablet   Mixed hyperlipidemia  Relevant Medications   atorvastatin (LIPITOR) 40 MG tablet       1. Change to now require power mobility device (PMD) - Persistent  Left sided residual weakness following prior CVA/Stroke, with Left Upper Extremity > lower extremity  Hemiplegia, limiting his ability to manually operate a manual wheelchair or other assistance devices for mobility.   2. Height = , Weight = 200 lbs   3. -  O2 Saturation = Unavailable data. - Edema = Negative - Pressure Sores = None actively - Ability to Shift Weight = Able to shift weight, but unable to transfer due to lack of strength, Left side weakness   4. Medical Exam - Cardiac - Known history of CVA. Hemodynamically stable with history of Hypertension. - Pulmonary - No significant history. - Musculoskeletal - Chronic pain syndrome in multiple areas and joints - Neurological - S/p Left hemiparesis, upper extremity weakness compared to lower extremity - Ambulatory Exam - Unable to be performed. Patient is non-ambulatory s/p L sided hemiplegia weakness.   5. Upper and Lower Extremity Assessment See above documentation  RUE - Strength - 5 out of 5 - Pain - 2 out of 10 - Range of Motion - Mostly full range   LUE - Strength - 3 out of 5 - Pain - 7 out of 10 - Range of Motion - limited range   RLE - Strength 5 out of 5 - Pain - 2 out of 10 - Range of Motion - mostly full range  LLE - Strength - 4 out of 5 - limited - Pain - 7 out of 10 - Range of Motion - limited   - Gait Pattern: Unable to assess. Non ambulatory. S/p L-hemiplegia   ----------------------------------------------------   Medical Conditions: - The primary medical condition that impacts patient's mobility need is his status of spastic hemiplegia left sided secondary to CVA residual deficit, affecting Left Upper extremity primarily and also more mildly Left lower extremity. - Additional medical conditions contributing to his mobility, include chronic pain syndrome, hypertension, OSA   MRADLs impaired in the home include: - PMD is necessary for patient's mobility to get to the bathroom for routine  toilet use. - PMD is necessary for patient's mobility to get to the kitchen to prepare meals - PMD is necessary for patient's mobility to get to the bedroom to dress and sleep   Cane or Walker Patient cannot use a cane / walker due to his status of Left Hemiplegia with weakness, as well as generalized weakness limiting his ability to properly and safely use these devices. His weakness as listed above with LUE 3 / 5 and LLE 4 / 5   Manual Wheelchair Patient cannot use MWC due to generalized weakness limiting his ability to properly and safely use this device to propel movement. His weakness as listed above with LUE 3 / 5, and LLE 4 / 5   Scooter (POV) Patient cannot use a POV due to lack of postural stability, with generalized weakness in upper and lower extremities.   Patient can safely operate the power mobility device physically. He is mentally capable to operate the device.   Patient is willing and motivated to use the power mobility device in his home to improve his quality of life.  Will fax records and order request to [facility]   Meds ordered this encounter  Medications  . atorvastatin (LIPITOR) 40 MG tablet    Sig: Take 1 tablet (40 mg total) by mouth every evening.  Dispense:  90 tablet    Refill:  1     Follow-up: - Return in 3 months  Patient verbalizes understanding with the above medical recommendations including the limitation of remote medical advice.  Specific follow-up and call-back criteria were given for patient to follow-up or seek medical care more urgently if needed.   - Time spent in direct consultation with patient on phone: 14 minutes  Nobie Putnam, [facility] 06/10/2019, 11:21 AM

## 2019-06-11 DIAGNOSIS — I10 Essential (primary) hypertension: Secondary | ICD-10-CM | POA: Diagnosis not present

## 2019-06-11 DIAGNOSIS — R498 Other voice and resonance disorders: Secondary | ICD-10-CM | POA: Diagnosis not present

## 2019-06-11 DIAGNOSIS — G894 Chronic pain syndrome: Secondary | ICD-10-CM | POA: Diagnosis not present

## 2019-06-11 DIAGNOSIS — F338 Other recurrent depressive disorders: Secondary | ICD-10-CM | POA: Diagnosis not present

## 2019-06-11 DIAGNOSIS — G4733 Obstructive sleep apnea (adult) (pediatric): Secondary | ICD-10-CM | POA: Diagnosis not present

## 2019-06-11 DIAGNOSIS — M1990 Unspecified osteoarthritis, unspecified site: Secondary | ICD-10-CM | POA: Diagnosis not present

## 2019-06-11 DIAGNOSIS — M5106 Intervertebral disc disorders with myelopathy, lumbar region: Secondary | ICD-10-CM | POA: Diagnosis not present

## 2019-06-11 DIAGNOSIS — M5136 Other intervertebral disc degeneration, lumbar region: Secondary | ICD-10-CM | POA: Diagnosis not present

## 2019-06-11 DIAGNOSIS — I69354 Hemiplegia and hemiparesis following cerebral infarction affecting left non-dominant side: Secondary | ICD-10-CM | POA: Diagnosis not present

## 2019-06-13 DIAGNOSIS — R498 Other voice and resonance disorders: Secondary | ICD-10-CM | POA: Diagnosis not present

## 2019-06-13 DIAGNOSIS — I69354 Hemiplegia and hemiparesis following cerebral infarction affecting left non-dominant side: Secondary | ICD-10-CM | POA: Diagnosis not present

## 2019-06-13 DIAGNOSIS — M5136 Other intervertebral disc degeneration, lumbar region: Secondary | ICD-10-CM | POA: Diagnosis not present

## 2019-06-13 DIAGNOSIS — I10 Essential (primary) hypertension: Secondary | ICD-10-CM | POA: Diagnosis not present

## 2019-06-13 DIAGNOSIS — G4733 Obstructive sleep apnea (adult) (pediatric): Secondary | ICD-10-CM | POA: Diagnosis not present

## 2019-06-13 DIAGNOSIS — M5106 Intervertebral disc disorders with myelopathy, lumbar region: Secondary | ICD-10-CM | POA: Diagnosis not present

## 2019-06-13 DIAGNOSIS — G894 Chronic pain syndrome: Secondary | ICD-10-CM | POA: Diagnosis not present

## 2019-06-13 DIAGNOSIS — N390 Urinary tract infection, site not specified: Secondary | ICD-10-CM | POA: Diagnosis not present

## 2019-06-13 DIAGNOSIS — F338 Other recurrent depressive disorders: Secondary | ICD-10-CM | POA: Diagnosis not present

## 2019-06-13 DIAGNOSIS — M1990 Unspecified osteoarthritis, unspecified site: Secondary | ICD-10-CM | POA: Diagnosis not present

## 2019-06-16 LAB — URINALYSIS
Blood, UA: NEGATIVE
Glucose, Ur: NEGATIVE
Ketones, UA: NEGATIVE
Leukocytes,UA: NEGATIVE
Nitrite, UA: NEGATIVE — AB
Protein,UA: NEGATIVE
RBC, UA: 0
Specific Gravity, UA: 1.026
pH, Urine: 7

## 2019-06-16 LAB — URINE CULTURE

## 2019-06-17 DIAGNOSIS — M1990 Unspecified osteoarthritis, unspecified site: Secondary | ICD-10-CM | POA: Diagnosis not present

## 2019-06-17 DIAGNOSIS — M5136 Other intervertebral disc degeneration, lumbar region: Secondary | ICD-10-CM | POA: Diagnosis not present

## 2019-06-17 DIAGNOSIS — F338 Other recurrent depressive disorders: Secondary | ICD-10-CM | POA: Diagnosis not present

## 2019-06-17 DIAGNOSIS — I10 Essential (primary) hypertension: Secondary | ICD-10-CM | POA: Diagnosis not present

## 2019-06-17 DIAGNOSIS — R498 Other voice and resonance disorders: Secondary | ICD-10-CM | POA: Diagnosis not present

## 2019-06-17 DIAGNOSIS — G894 Chronic pain syndrome: Secondary | ICD-10-CM | POA: Diagnosis not present

## 2019-06-17 DIAGNOSIS — M5106 Intervertebral disc disorders with myelopathy, lumbar region: Secondary | ICD-10-CM | POA: Diagnosis not present

## 2019-06-17 DIAGNOSIS — G4733 Obstructive sleep apnea (adult) (pediatric): Secondary | ICD-10-CM | POA: Diagnosis not present

## 2019-06-17 DIAGNOSIS — I69354 Hemiplegia and hemiparesis following cerebral infarction affecting left non-dominant side: Secondary | ICD-10-CM | POA: Diagnosis not present

## 2019-06-18 ENCOUNTER — Telehealth: Payer: Self-pay

## 2019-06-18 DIAGNOSIS — G4733 Obstructive sleep apnea (adult) (pediatric): Secondary | ICD-10-CM | POA: Diagnosis not present

## 2019-06-18 DIAGNOSIS — M5136 Other intervertebral disc degeneration, lumbar region: Secondary | ICD-10-CM | POA: Diagnosis not present

## 2019-06-18 DIAGNOSIS — I10 Essential (primary) hypertension: Secondary | ICD-10-CM | POA: Diagnosis not present

## 2019-06-18 DIAGNOSIS — R498 Other voice and resonance disorders: Secondary | ICD-10-CM | POA: Diagnosis not present

## 2019-06-18 DIAGNOSIS — M1990 Unspecified osteoarthritis, unspecified site: Secondary | ICD-10-CM | POA: Diagnosis not present

## 2019-06-18 DIAGNOSIS — M5106 Intervertebral disc disorders with myelopathy, lumbar region: Secondary | ICD-10-CM | POA: Diagnosis not present

## 2019-06-18 DIAGNOSIS — G894 Chronic pain syndrome: Secondary | ICD-10-CM | POA: Diagnosis not present

## 2019-06-18 DIAGNOSIS — F338 Other recurrent depressive disorders: Secondary | ICD-10-CM | POA: Diagnosis not present

## 2019-06-18 DIAGNOSIS — I69354 Hemiplegia and hemiparesis following cerebral infarction affecting left non-dominant side: Secondary | ICD-10-CM | POA: Diagnosis not present

## 2019-06-18 NOTE — Telephone Encounter (Signed)
Cecille Rubin Flowers occupational therapist from [employer] is reporting for patient's fall past week he fell off from toilet but no injuries reported by patient. She had email me a forms for hospital bed and also wants to know about power wheel chair. I advised her that she needs to find out the company first fax the forms and then schedule face to face appointment with Dr. Raliegh Ip. Her number is [phone].

## 2019-06-18 NOTE — Telephone Encounter (Signed)
Additionally  Urinalysis and urine culture results were reviewed and it was negative. No sign of urinary tract infection,.  Nobie Putnam, [facility] 06/18/2019, 5:22 PM

## 2019-06-18 NOTE — Telephone Encounter (Signed)
We have already done wheelchair assessment virtually due to [place] this may be able to be done virtual visit.  It was done on 06/10/19, and papers were printed and it was ready to be faxed.  Patient requested that we send wheelchair info to [facility]. May have to check with Rachell to determine where this was sent.  I can complete this form requested for the hospital bed based on that information from 06/10/19.  However, again we do not have a specific company who is providing the hospital bed.  If they identify where this form needs to be sent, we can send it there. If they request any additional information or face to face encounter, we can schedule that  Nobie Putnam, [place] [facility] 06/18/2019, 5:21 PM

## 2019-06-19 NOTE — Telephone Encounter (Signed)
Cecille Rubin and patient notified.

## 2019-06-20 DIAGNOSIS — M1990 Unspecified osteoarthritis, unspecified site: Secondary | ICD-10-CM | POA: Diagnosis not present

## 2019-06-20 DIAGNOSIS — M5106 Intervertebral disc disorders with myelopathy, lumbar region: Secondary | ICD-10-CM | POA: Diagnosis not present

## 2019-06-20 DIAGNOSIS — I69354 Hemiplegia and hemiparesis following cerebral infarction affecting left non-dominant side: Secondary | ICD-10-CM | POA: Diagnosis not present

## 2019-06-20 DIAGNOSIS — I10 Essential (primary) hypertension: Secondary | ICD-10-CM | POA: Diagnosis not present

## 2019-06-20 DIAGNOSIS — M5136 Other intervertebral disc degeneration, lumbar region: Secondary | ICD-10-CM | POA: Diagnosis not present

## 2019-06-20 DIAGNOSIS — G4733 Obstructive sleep apnea (adult) (pediatric): Secondary | ICD-10-CM | POA: Diagnosis not present

## 2019-06-20 DIAGNOSIS — F338 Other recurrent depressive disorders: Secondary | ICD-10-CM | POA: Diagnosis not present

## 2019-06-20 DIAGNOSIS — R498 Other voice and resonance disorders: Secondary | ICD-10-CM | POA: Diagnosis not present

## 2019-06-20 DIAGNOSIS — G894 Chronic pain syndrome: Secondary | ICD-10-CM | POA: Diagnosis not present

## 2019-06-24 DIAGNOSIS — F338 Other recurrent depressive disorders: Secondary | ICD-10-CM | POA: Diagnosis not present

## 2019-06-24 DIAGNOSIS — I69354 Hemiplegia and hemiparesis following cerebral infarction affecting left non-dominant side: Secondary | ICD-10-CM | POA: Diagnosis not present

## 2019-06-24 DIAGNOSIS — M1990 Unspecified osteoarthritis, unspecified site: Secondary | ICD-10-CM | POA: Diagnosis not present

## 2019-06-24 DIAGNOSIS — M5136 Other intervertebral disc degeneration, lumbar region: Secondary | ICD-10-CM | POA: Diagnosis not present

## 2019-06-24 DIAGNOSIS — G894 Chronic pain syndrome: Secondary | ICD-10-CM | POA: Diagnosis not present

## 2019-06-24 DIAGNOSIS — R498 Other voice and resonance disorders: Secondary | ICD-10-CM | POA: Diagnosis not present

## 2019-06-24 DIAGNOSIS — M5106 Intervertebral disc disorders with myelopathy, lumbar region: Secondary | ICD-10-CM | POA: Diagnosis not present

## 2019-06-24 DIAGNOSIS — G4733 Obstructive sleep apnea (adult) (pediatric): Secondary | ICD-10-CM | POA: Diagnosis not present

## 2019-06-24 DIAGNOSIS — I10 Essential (primary) hypertension: Secondary | ICD-10-CM | POA: Diagnosis not present

## 2019-06-25 DIAGNOSIS — I69354 Hemiplegia and hemiparesis following cerebral infarction affecting left non-dominant side: Secondary | ICD-10-CM | POA: Diagnosis not present

## 2019-06-25 DIAGNOSIS — M5136 Other intervertebral disc degeneration, lumbar region: Secondary | ICD-10-CM | POA: Diagnosis not present

## 2019-06-25 DIAGNOSIS — I10 Essential (primary) hypertension: Secondary | ICD-10-CM | POA: Diagnosis not present

## 2019-06-25 DIAGNOSIS — R498 Other voice and resonance disorders: Secondary | ICD-10-CM | POA: Diagnosis not present

## 2019-06-25 DIAGNOSIS — M1990 Unspecified osteoarthritis, unspecified site: Secondary | ICD-10-CM | POA: Diagnosis not present

## 2019-06-25 DIAGNOSIS — M5106 Intervertebral disc disorders with myelopathy, lumbar region: Secondary | ICD-10-CM | POA: Diagnosis not present

## 2019-06-25 DIAGNOSIS — F338 Other recurrent depressive disorders: Secondary | ICD-10-CM | POA: Diagnosis not present

## 2019-06-25 DIAGNOSIS — G4733 Obstructive sleep apnea (adult) (pediatric): Secondary | ICD-10-CM | POA: Diagnosis not present

## 2019-06-25 DIAGNOSIS — G894 Chronic pain syndrome: Secondary | ICD-10-CM | POA: Diagnosis not present

## 2019-06-26 DIAGNOSIS — U071 COVID-19: Secondary | ICD-10-CM

## 2019-06-26 HISTORY — DX: COVID-19: U07.1

## 2019-06-27 ENCOUNTER — Telehealth: Payer: Self-pay | Admitting: Family Medicine

## 2019-06-27 DIAGNOSIS — G894 Chronic pain syndrome: Secondary | ICD-10-CM

## 2019-06-27 DIAGNOSIS — G4733 Obstructive sleep apnea (adult) (pediatric): Secondary | ICD-10-CM | POA: Diagnosis not present

## 2019-06-27 DIAGNOSIS — I69354 Hemiplegia and hemiparesis following cerebral infarction affecting left non-dominant side: Secondary | ICD-10-CM | POA: Diagnosis not present

## 2019-06-27 DIAGNOSIS — F338 Other recurrent depressive disorders: Secondary | ICD-10-CM | POA: Diagnosis not present

## 2019-06-27 DIAGNOSIS — R498 Other voice and resonance disorders: Secondary | ICD-10-CM | POA: Diagnosis not present

## 2019-06-27 DIAGNOSIS — M1990 Unspecified osteoarthritis, unspecified site: Secondary | ICD-10-CM | POA: Diagnosis not present

## 2019-06-27 DIAGNOSIS — G8929 Other chronic pain: Secondary | ICD-10-CM

## 2019-06-27 DIAGNOSIS — M5106 Intervertebral disc disorders with myelopathy, lumbar region: Secondary | ICD-10-CM | POA: Diagnosis not present

## 2019-06-27 DIAGNOSIS — I10 Essential (primary) hypertension: Secondary | ICD-10-CM | POA: Diagnosis not present

## 2019-06-27 DIAGNOSIS — M5136 Other intervertebral disc degeneration, lumbar region: Secondary | ICD-10-CM | POA: Diagnosis not present

## 2019-06-27 MED ORDER — PREDNISONE 10 MG PO TABS: ORAL_TABLET | ORAL | 0 refills | Status: DC

## 2019-06-27 NOTE — Telephone Encounter (Signed)
Pt called requesting prednisone to be called in [place]

## 2019-06-27 NOTE — Telephone Encounter (Signed)
Patient is not on chronic prednisone  Has multiple chronic pain issues  Agreed to call in prednisone this time, as a one time rx for 6 day taper due to no available office visit apt today, and tomorrow office is closed  Nobie Putnam, [place] [facility] 06/27/2019, 12:01 PM

## 2019-07-02 DIAGNOSIS — I10 Essential (primary) hypertension: Secondary | ICD-10-CM | POA: Diagnosis not present

## 2019-07-02 DIAGNOSIS — G4733 Obstructive sleep apnea (adult) (pediatric): Secondary | ICD-10-CM | POA: Diagnosis not present

## 2019-07-02 DIAGNOSIS — G894 Chronic pain syndrome: Secondary | ICD-10-CM | POA: Diagnosis not present

## 2019-07-02 DIAGNOSIS — M5106 Intervertebral disc disorders with myelopathy, lumbar region: Secondary | ICD-10-CM | POA: Diagnosis not present

## 2019-07-02 DIAGNOSIS — F338 Other recurrent depressive disorders: Secondary | ICD-10-CM | POA: Diagnosis not present

## 2019-07-02 DIAGNOSIS — M5136 Other intervertebral disc degeneration, lumbar region: Secondary | ICD-10-CM | POA: Diagnosis not present

## 2019-07-02 DIAGNOSIS — R498 Other voice and resonance disorders: Secondary | ICD-10-CM | POA: Diagnosis not present

## 2019-07-02 DIAGNOSIS — M1990 Unspecified osteoarthritis, unspecified site: Secondary | ICD-10-CM | POA: Diagnosis not present

## 2019-07-02 DIAGNOSIS — I69354 Hemiplegia and hemiparesis following cerebral infarction affecting left non-dominant side: Secondary | ICD-10-CM | POA: Diagnosis not present

## 2019-07-04 DIAGNOSIS — M1990 Unspecified osteoarthritis, unspecified site: Secondary | ICD-10-CM | POA: Diagnosis not present

## 2019-07-04 DIAGNOSIS — G894 Chronic pain syndrome: Secondary | ICD-10-CM | POA: Diagnosis not present

## 2019-07-04 DIAGNOSIS — R498 Other voice and resonance disorders: Secondary | ICD-10-CM | POA: Diagnosis not present

## 2019-07-04 DIAGNOSIS — M5106 Intervertebral disc disorders with myelopathy, lumbar region: Secondary | ICD-10-CM | POA: Diagnosis not present

## 2019-07-04 DIAGNOSIS — I10 Essential (primary) hypertension: Secondary | ICD-10-CM | POA: Diagnosis not present

## 2019-07-04 DIAGNOSIS — G4733 Obstructive sleep apnea (adult) (pediatric): Secondary | ICD-10-CM | POA: Diagnosis not present

## 2019-07-04 DIAGNOSIS — I69354 Hemiplegia and hemiparesis following cerebral infarction affecting left non-dominant side: Secondary | ICD-10-CM | POA: Diagnosis not present

## 2019-07-04 DIAGNOSIS — F338 Other recurrent depressive disorders: Secondary | ICD-10-CM | POA: Diagnosis not present

## 2019-07-04 DIAGNOSIS — M5136 Other intervertebral disc degeneration, lumbar region: Secondary | ICD-10-CM | POA: Diagnosis not present

## 2019-07-09 DIAGNOSIS — G4733 Obstructive sleep apnea (adult) (pediatric): Secondary | ICD-10-CM | POA: Diagnosis not present

## 2019-07-09 DIAGNOSIS — M5136 Other intervertebral disc degeneration, lumbar region: Secondary | ICD-10-CM | POA: Diagnosis not present

## 2019-07-09 DIAGNOSIS — M1990 Unspecified osteoarthritis, unspecified site: Secondary | ICD-10-CM | POA: Diagnosis not present

## 2019-07-09 DIAGNOSIS — I69354 Hemiplegia and hemiparesis following cerebral infarction affecting left non-dominant side: Secondary | ICD-10-CM | POA: Diagnosis not present

## 2019-07-09 DIAGNOSIS — G894 Chronic pain syndrome: Secondary | ICD-10-CM | POA: Diagnosis not present

## 2019-07-09 DIAGNOSIS — R498 Other voice and resonance disorders: Secondary | ICD-10-CM | POA: Diagnosis not present

## 2019-07-09 DIAGNOSIS — F338 Other recurrent depressive disorders: Secondary | ICD-10-CM | POA: Diagnosis not present

## 2019-07-09 DIAGNOSIS — M5106 Intervertebral disc disorders with myelopathy, lumbar region: Secondary | ICD-10-CM | POA: Diagnosis not present

## 2019-07-09 DIAGNOSIS — I10 Essential (primary) hypertension: Secondary | ICD-10-CM | POA: Diagnosis not present

## 2019-07-11 ENCOUNTER — Other Ambulatory Visit: Payer: Self-pay | Admitting: *Deleted

## 2019-07-11 DIAGNOSIS — G894 Chronic pain syndrome: Secondary | ICD-10-CM | POA: Diagnosis not present

## 2019-07-11 DIAGNOSIS — M1990 Unspecified osteoarthritis, unspecified site: Secondary | ICD-10-CM | POA: Diagnosis not present

## 2019-07-11 DIAGNOSIS — Z20822 Contact with and (suspected) exposure to covid-19: Secondary | ICD-10-CM

## 2019-07-11 DIAGNOSIS — F338 Other recurrent depressive disorders: Secondary | ICD-10-CM | POA: Diagnosis not present

## 2019-07-11 DIAGNOSIS — R498 Other voice and resonance disorders: Secondary | ICD-10-CM | POA: Diagnosis not present

## 2019-07-11 DIAGNOSIS — I10 Essential (primary) hypertension: Secondary | ICD-10-CM | POA: Diagnosis not present

## 2019-07-11 DIAGNOSIS — M5136 Other intervertebral disc degeneration, lumbar region: Secondary | ICD-10-CM | POA: Diagnosis not present

## 2019-07-11 DIAGNOSIS — M5106 Intervertebral disc disorders with myelopathy, lumbar region: Secondary | ICD-10-CM | POA: Diagnosis not present

## 2019-07-11 DIAGNOSIS — I69354 Hemiplegia and hemiparesis following cerebral infarction affecting left non-dominant side: Secondary | ICD-10-CM | POA: Diagnosis not present

## 2019-07-11 DIAGNOSIS — G4733 Obstructive sleep apnea (adult) (pediatric): Secondary | ICD-10-CM | POA: Diagnosis not present

## 2019-07-12 ENCOUNTER — Emergency Department
Admission: EM | Admit: 2019-07-12 | Discharge: 2019-07-12 | Disposition: A | Discharge status: Home / Self Care | Payer: Medicare HMO | Attending: Emergency Medicine | Admitting: Emergency Medicine

## 2019-07-12 ENCOUNTER — Other Ambulatory Visit: Payer: Self-pay

## 2019-07-12 ENCOUNTER — Emergency Department: Payer: Medicare HMO

## 2019-07-12 ENCOUNTER — Encounter: Payer: Self-pay | Admitting: Emergency Medicine

## 2019-07-12 DIAGNOSIS — R05 Cough: Secondary | ICD-10-CM | POA: Diagnosis not present

## 2019-07-12 DIAGNOSIS — Z8673 Personal history of transient ischemic attack (TIA), and cerebral infarction without residual deficits: Secondary | ICD-10-CM | POA: Insufficient documentation

## 2019-07-12 DIAGNOSIS — Z79899 Other long term (current) drug therapy: Secondary | ICD-10-CM | POA: Insufficient documentation

## 2019-07-12 DIAGNOSIS — F17210 Nicotine dependence, cigarettes, uncomplicated: Secondary | ICD-10-CM | POA: Insufficient documentation

## 2019-07-12 DIAGNOSIS — F17228 Nicotine dependence, chewing tobacco, with other nicotine-induced disorders: Secondary | ICD-10-CM | POA: Insufficient documentation

## 2019-07-12 DIAGNOSIS — M7918 Myalgia, other site: Secondary | ICD-10-CM | POA: Diagnosis not present

## 2019-07-12 DIAGNOSIS — R531 Weakness: Secondary | ICD-10-CM | POA: Diagnosis not present

## 2019-07-12 DIAGNOSIS — U071 COVID-19: Secondary | ICD-10-CM | POA: Diagnosis not present

## 2019-07-12 DIAGNOSIS — R110 Nausea: Secondary | ICD-10-CM | POA: Insufficient documentation

## 2019-07-12 DIAGNOSIS — R070 Pain in throat: Secondary | ICD-10-CM | POA: Diagnosis present

## 2019-07-12 DIAGNOSIS — R0602 Shortness of breath: Secondary | ICD-10-CM | POA: Diagnosis not present

## 2019-07-12 LAB — CBC WITH DIFFERENTIAL/PLATELET
Abs Immature Granulocytes: 0 10*3/uL (ref 0.00–0.07)
Basophils Absolute: 0 10*3/uL (ref 0.0–0.1)
Basophils Relative: 1 %
Eosinophils Absolute: 0.2 10*3/uL (ref 0.0–0.5)
Eosinophils Relative: 5 %
HCT: 40.9 % (ref 39.0–52.0)
Hemoglobin: 13.4 g/dL (ref 13.0–17.0)
Immature Granulocytes: 0 %
Lymphocytes Relative: 49 %
Lymphs Abs: 2.1 10*3/uL (ref 0.7–4.0)
MCH: 29.3 pg (ref 26.0–34.0)
MCHC: 32.8 g/dL (ref 30.0–36.0)
MCV: 89.5 fL (ref 80.0–100.0)
Monocytes Absolute: 0.3 10*3/uL (ref 0.1–1.0)
Monocytes Relative: 6 %
Neutro Abs: 1.6 10*3/uL — ABNORMAL LOW (ref 1.7–7.7)
Neutrophils Relative %: 39 %
Platelets: 277 10*3/uL (ref 150–400)
RBC: 4.57 MIL/uL (ref 4.22–5.81)
RDW: 14 % (ref 11.5–15.5)
WBC: 4.2 10*3/uL (ref 4.0–10.5)
nRBC: 0 % (ref 0.0–0.2)

## 2019-07-12 LAB — COMPREHENSIVE METABOLIC PANEL
ALT: 16 U/L (ref 0–44)
AST: 20 U/L (ref 15–41)
Albumin: 3.6 g/dL (ref 3.5–5.0)
Alkaline Phosphatase: 73 U/L (ref 38–126)
Anion gap: 12 (ref 5–15)
BUN: 11 mg/dL (ref 6–20)
CO2: 23 mmol/L (ref 22–32)
Calcium: 8.9 mg/dL (ref 8.9–10.3)
Chloride: 106 mmol/L (ref 98–111)
Creatinine, Ser: 0.65 mg/dL (ref 0.61–1.24)
GFR calc Af Amer: >60 mL/min (ref >60)
GFR calc non Af Amer: >60 mL/min (ref >60)
Glucose, Bld: 100 mg/dL — ABNORMAL HIGH (ref 70–99)
Potassium: 3.7 mmol/L (ref 3.5–5.1)
Sodium: 141 mmol/L (ref 135–145)
Total Bilirubin: 0.6 mg/dL (ref 0.3–1.2)
Total Protein: 6.9 g/dL (ref 6.5–8.1)

## 2019-07-12 MED ORDER — ONDANSETRON HCL 4 MG PO TABS: 4.0000 mg | ORAL_TABLET | Freq: Every day | ORAL | 0 refills | Status: DC | PRN

## 2019-07-12 MED ORDER — SODIUM CHLORIDE 0.9 % IV BOLUS
1000.0000 mL | Freq: Once | INTRAVENOUS | Status: AC
  Administered 2019-07-12: 21:00:00 1000 mL via INTRAVENOUS

## 2019-07-12 MED ORDER — MORPHINE SULFATE (PF) 4 MG/ML IV SOLN
4.0000 mg | Freq: Once | INTRAVENOUS | Status: AC
  Administered 2019-07-12: 21:00:00 4 mg via INTRAVENOUS
  Filled 2019-07-12: qty 1

## 2019-07-12 MED ORDER — GUAIFENESIN-CODEINE 100-10 MG/5ML PO SOLN: 5.0000 mL | Freq: Four times a day (QID) | ORAL | 0 refills | Status: DC | PRN

## 2019-07-12 MED ORDER — ONDANSETRON HCL 4 MG/2ML IJ SOLN
4.0000 mg | Freq: Once | INTRAMUSCULAR | Status: AC
  Administered 2019-07-12: 21:00:00 4 mg via INTRAVENOUS
  Filled 2019-07-12: qty 2

## 2019-07-12 NOTE — ED Notes (Signed)
Pt given sprite as requested and waiting for family to pickup, to be DC'd after fluids finished or when pt's ready, whichever first

## 2019-07-12 NOTE — ED Notes (Signed)
EMS IV infiltrated, new IV started  Pt given drink and food as requested

## 2019-07-12 NOTE — ED Provider Notes (Signed)
[facility] Emergency Department Provider Note  Time seen: 8:11 PM  I have reviewed the triage vital signs and the nursing notes.   HISTORY  Chief Complaint Weakness and Shortness of Breath   HPI Mark Wells is a 48 y.o. male with a past medical history of anemia, anxiety, chronic back pain, presents to the emergency department for sore throat, subjective fever, body aches and nausea.  According to the patient over the past 3 days or so  he has been experiencing subjective fevers, sore throat and body aches with occasional nausea but denies any vomiting.  Patient states his mother as well as sister recently had a coronavirus and he is concerned that he could have coronavirus.  Patient states he began coughing this morning.  Denies any shortness of breath.  Denies any chest pain.  Past Medical History:  Diagnosis Date  . Anemia   . Anxiety   . Chronic back pain   . Depression   . Hemiplegia affecting left nondominant side (HCC)   . Hyperlipidemia   . Mood disorder (HCC)   . Personality disorder (HCC)   . Restless leg syndrome   . Sleep apnea   . Stroke [facility](HCC)     Patient Active Problem List   Diagnosis Date Noted  . OSA on CPAP 12/09/2018  . Chronic pain syndrome 12/09/2018  . Major depression, recurrent, chronic (HCC) 12/09/2018  . Insomnia 12/09/2018  . Spastic hemiplegia of left dominant side as late effect of cerebral infarction (HCC) 12/07/2018  . Painful orthopaedic hardware (HCC) 09/03/2018  . BACK PAIN 01/07/2011  . ADVERSE DRUG REACTION 01/01/2011  . Essential hypertension 12/28/2010  . LOW BACK PAIN SYNDROME, SEVERE 12/28/2010  . OTHER URINARY INCONTINENCE 12/28/2010    Past Surgical History:  Procedure Laterality Date  . BACK SURGERY      Prior to Admission medications   Medication Sig Start Date End Date Taking? Authorizing Provider  amLODipine (NORVASC) 5 MG tablet Take 1 tablet (5 mg total) by mouth daily. 02/26/19   Karamalegos,  Netta NeatAlexander J, DO  atorvastatin (LIPITOR) 40 MG tablet Take 1 tablet (40 mg total) by mouth every evening. 06/10/19   Karamalegos, Netta NeatAlexander J, DO  baclofen (LIORESAL) 20 MG tablet Take 20 mg by mouth 4 (four) times daily. 03/21/19   [provider]  chlorthalidone (HYGROTON) 25 MG tablet Take 1 tablet (25 mg total) by mouth daily. 02/26/19   Karamalegos, Netta NeatAlexander J, DO  clopidogrel (PLAVIX) 75 MG tablet Take 1 tablet (75 mg total) by mouth daily. 05/15/19   Karamalegos, Netta NeatAlexander J, DO  losartan (COZAAR) 100 MG tablet Take 1 tablet (100 mg total) by mouth daily. 02/26/19   Karamalegos, Netta NeatAlexander J, DO  meclizine (ANTIVERT) 25 MG tablet Take 1 tablet (25 mg total) by mouth 3 (three) times daily as needed for dizziness. 02/26/19   Karamalegos, Netta NeatAlexander J, DO  methocarbamol (ROBAXIN) 500 MG tablet Take 1 tablet (500 mg total) by mouth every 6 (six) hours as needed for muscle spasms. 02/26/19   Karamalegos, Netta NeatAlexander J, DO  oxyCODONE-acetaminophen (PERCOCET) 7.5-325 MG tablet Take 1 tablet by mouth 3 (three) times daily as needed for severe pain.    [provider]  oxyCODONE-acetaminophen (PERCOCET) 7.5-325 MG tablet oxycodone-acetaminophen 7.5 mg-325 mg tablet  TAKE 1 TABLET BY MOUTH EVERY 8 HOURS AS NEEDED PAIN. DO NOT FILL BEFORE 04/29/2019    [provider]  potassium chloride (K-DUR) 10 MEQ tablet Take 1 tablet (10 mEq total) by mouth 2 (  two) times daily. 02/26/19   Karamalegos, Devonne Doughty, DO  predniSONE (DELTASONE) 10 MG tablet Take 6 tabs with breakfast Day 1, 5 tabs Day 2, 4 tabs Day 3, 3 tabs Day 4, 2 tabs Day 5, 1 tab Day 6. 06/27/19   Karamalegos, Devonne Doughty, DO  pregabalin (LYRICA) 300 MG capsule Take 300 mg by mouth 2 (two) times daily.     [provider]    Allergies  Allergen Reactions  . Aspirin Other (See Comments)    Has Gastric Ulcer  . Ibuprofen Other (See Comments)    Ulcers- bleeding  . Shellfish Allergy Swelling    No family history on  file.  Social History Social History   Tobacco Use  . Smoking status: Current Every Day Smoker    Packs/day: 0.50    Types: Cigarettes  . Smokeless tobacco: Current User  Substance Use Topics  . Alcohol use: No  . Drug use: No    Review of Systems Constitutional: Subjective fever.  Positive for body aches. ENT: Positive for sore throat. Cardiovascular: Negative for chest pain. Respiratory: Negative for shortness of breath.  Occasional cough. Gastrointestinal: Negative for abdominal pain.  Positive for nausea.  Negative for vomiting or diarrhea. Genitourinary: Negative for urinary compaints Musculoskeletal: Negative for musculoskeletal complaints Neurological: Negative for headache All other ROS negative  ____________________________________________   PHYSICAL EXAM:  VITAL SIGNS: ED Triage Vitals  Enc Vitals Group     BP 07/12/19 1641 126/74     Pulse Rate 07/12/19 1639 81     Resp 07/12/19 1639 20     Temp 07/12/19 1639 98.3 F (36.8 C)     Temp Source 07/12/19 1639 Oral     SpO2 07/12/19 1621 96 %     Weight 07/12/19 1640 170 lb (77.1 kg)     Height 07/12/19 1640 5\' 6"  (1.676 m)     Head Circumference --      Peak Flow --      Pain Score 07/12/19 1640 8     Pain Loc --      Pain Edu? --      Excl. in [place]? --    Constitutional: Alert and oriented. Well appearing and in no distress. Eyes: Normal exam ENT      Head: Normocephalic and atraumatic.      Mouth/Throat: Mucous membranes are moist. Cardiovascular: Normal rate, regular rhythm. No murmur Respiratory: Normal respiratory effort without tachypnea nor retractions. Breath sounds are clear  Gastrointestinal: Soft and nontender. No distention. Musculoskeletal: Nontender with normal range of motion in all extremities.  Neurologic:  Normal speech and language. No gross focal neurologic deficits  Skin:  Skin is warm, dry and intact.  Psychiatric: Mood and affect are normal.    RADIOLOGY  Chest x-ray  negative  ____________________________________________   INITIAL IMPRESSION / ASSESSMENT AND PLAN / ED COURSE  Pertinent labs & imaging results that were available during my care of the patient were reviewed by me and considered in my medical decision making (see chart for details).   Patient presents to the emergency department for sore throat, generalized fatigue, body aches, nausea.  Patient states his sibling was recently diagnosed with coronavirus as well as his mother.  Patient is concerned that he could have coronavirus, states occasional cough today as well.  Overall the patient appears well, basic labs are largely within normal limits.  Chest x-ray negative.  Coronavirus swab pending.  We will treat pain nausea and IV hydrate while awaiting  coronavirus swab results.  Patient is coronavirus positive.  We will discharge with cough medication and nausea medication for the patient.  I discussed supportive care including Tylenol or ibuprofen every 6 hours and plenty of fluids as well as isolation precautions.  I also discussed return precautions for any trouble breathing.  Mark Wells was evaluated in Emergency Department on 07/12/2019 for the symptoms described in the history of present illness. He was evaluated in the context of the global COVID-19 pandemic, which necessitated consideration that the patient might be at risk for infection with the SARS-CoV-2 virus that causes COVID-19. Institutional protocols and algorithms that pertain to the evaluation of patients at risk for COVID-19 are in a state of rapid change based on information released by regulatory bodies including the CDC and federal and state organizations. These policies and algorithms were followed during the patient's care in the ED.  ____________________________________________   FINAL CLINICAL IMPRESSION(S) / ED DIAGNOSES  Body aches Sore throat COVID-19   Minna AntisPaduchowski, Deuce Paternoster, MD 07/12/19 2108

## 2019-07-12 NOTE — ED Notes (Signed)
CRITICAL LAB: COVID is positive, [employer], Dr. Kerman Passey notified, orders received

## 2019-07-12 NOTE — ED Notes (Signed)
Pt from home with weakness and "a lot of phlegm in my throat" x 5 days. He reports sob for the same amount of time. Pt alert & oriented with NAD noted.  Pt has residual weakness on left arm and leg from prior cva.

## 2019-07-12 NOTE — ED Notes (Signed)
Peripheral IV discontinued. Catheter intact. No signs of infiltration or redness. Gauze applied to IV site.   Discharge instructions reviewed with patient. Questions fielded by this RN. Patient verbalizes understanding of instructions. Patient discharged home in stable condition per paduchowski. No acute distress noted at time of discharge.   Pt wheeled outside so pt could smoke, reports brother is 4 mins away

## 2019-07-12 NOTE — ED Triage Notes (Signed)
PT arrives with concerns over sore throat, shortness of breath, and weakness. Pt denies chest pain but does report generalized body aches. Pt reports symptoms started 4 days prior.

## 2019-07-13 LAB — SARS CORONAVIRUS 2 BY RT PCR (HOSPITAL ORDER, PERFORMED IN ~~LOC~~ HOSPITAL LAB): SARS Coronavirus 2: POSITIVE — AB

## 2019-07-14 LAB — NOVEL CORONAVIRUS, NAA: SARS-CoV-2, NAA: NOT DETECTED

## 2019-07-15 ENCOUNTER — Telehealth: Payer: Self-pay | Admitting: *Deleted

## 2019-07-15 NOTE — Telephone Encounter (Signed)
Pt called to give his results of covid-19.

## 2019-07-15 NOTE — Telephone Encounter (Signed)
Pt called to give him his covid-19 results. He had the test done on 07/16 with result on 07/19 was negative. And he had the test done in the hospital o 0717 and resulted on 07/18 was positive. He is advised to stay in quarantine for at least 14 days. Stay hydrated, get rest but also fresh air, wear a mask if he absolutely has to go out in public. Advised that he can take Tylenol as needed. He voiced understanding. But still has questions regarding being both negative and positive in the same week. He would like a call back from his provider regarding this. Advised him also that I would let the [facility] Department know of his results.

## 2019-07-16 DIAGNOSIS — R498 Other voice and resonance disorders: Secondary | ICD-10-CM | POA: Diagnosis not present

## 2019-07-16 DIAGNOSIS — I69354 Hemiplegia and hemiparesis following cerebral infarction affecting left non-dominant side: Secondary | ICD-10-CM | POA: Diagnosis not present

## 2019-07-16 DIAGNOSIS — F338 Other recurrent depressive disorders: Secondary | ICD-10-CM | POA: Diagnosis not present

## 2019-07-16 DIAGNOSIS — M5136 Other intervertebral disc degeneration, lumbar region: Secondary | ICD-10-CM | POA: Diagnosis not present

## 2019-07-16 DIAGNOSIS — M5106 Intervertebral disc disorders with myelopathy, lumbar region: Secondary | ICD-10-CM | POA: Diagnosis not present

## 2019-07-16 DIAGNOSIS — M1990 Unspecified osteoarthritis, unspecified site: Secondary | ICD-10-CM | POA: Diagnosis not present

## 2019-07-16 DIAGNOSIS — G4733 Obstructive sleep apnea (adult) (pediatric): Secondary | ICD-10-CM | POA: Diagnosis not present

## 2019-07-16 DIAGNOSIS — G894 Chronic pain syndrome: Secondary | ICD-10-CM | POA: Diagnosis not present

## 2019-07-16 DIAGNOSIS — I10 Essential (primary) hypertension: Secondary | ICD-10-CM | POA: Diagnosis not present

## 2019-07-17 ENCOUNTER — Other Ambulatory Visit: Payer: Self-pay

## 2019-07-17 ENCOUNTER — Encounter: Payer: Self-pay | Admitting: Emergency Medicine

## 2019-07-17 ENCOUNTER — Emergency Department
Admission: EM | Admit: 2019-07-17 | Discharge: 2019-07-18 | Disposition: A | Discharge status: Home / Self Care | Payer: Medicare HMO | Attending: Emergency Medicine | Admitting: Emergency Medicine

## 2019-07-17 DIAGNOSIS — F17228 Nicotine dependence, chewing tobacco, with other nicotine-induced disorders: Secondary | ICD-10-CM | POA: Diagnosis not present

## 2019-07-17 DIAGNOSIS — U071 COVID-19: Secondary | ICD-10-CM | POA: Diagnosis not present

## 2019-07-17 DIAGNOSIS — F17210 Nicotine dependence, cigarettes, uncomplicated: Secondary | ICD-10-CM | POA: Insufficient documentation

## 2019-07-17 DIAGNOSIS — M7918 Myalgia, other site: Secondary | ICD-10-CM | POA: Diagnosis not present

## 2019-07-17 DIAGNOSIS — R0602 Shortness of breath: Secondary | ICD-10-CM | POA: Insufficient documentation

## 2019-07-17 DIAGNOSIS — R197 Diarrhea, unspecified: Secondary | ICD-10-CM | POA: Diagnosis not present

## 2019-07-17 DIAGNOSIS — Z8673 Personal history of transient ischemic attack (TIA), and cerebral infarction without residual deficits: Secondary | ICD-10-CM | POA: Diagnosis not present

## 2019-07-17 DIAGNOSIS — R112 Nausea with vomiting, unspecified: Secondary | ICD-10-CM | POA: Diagnosis not present

## 2019-07-17 DIAGNOSIS — Z79899 Other long term (current) drug therapy: Secondary | ICD-10-CM | POA: Diagnosis not present

## 2019-07-17 DIAGNOSIS — R05 Cough: Secondary | ICD-10-CM | POA: Insufficient documentation

## 2019-07-17 DIAGNOSIS — I10 Essential (primary) hypertension: Secondary | ICD-10-CM | POA: Diagnosis not present

## 2019-07-17 DIAGNOSIS — R52 Pain, unspecified: Secondary | ICD-10-CM | POA: Diagnosis not present

## 2019-07-17 DIAGNOSIS — R531 Weakness: Secondary | ICD-10-CM | POA: Diagnosis not present

## 2019-07-17 LAB — CBC WITH DIFFERENTIAL/PLATELET
Abs Immature Granulocytes: 0.01 10*3/uL (ref 0.00–0.07)
Basophils Absolute: 0.1 10*3/uL (ref 0.0–0.1)
Basophils Relative: 1 %
Eosinophils Absolute: 0.2 10*3/uL (ref 0.0–0.5)
Eosinophils Relative: 5 %
HCT: 39.5 % (ref 39.0–52.0)
Hemoglobin: 12.9 g/dL — ABNORMAL LOW (ref 13.0–17.0)
Immature Granulocytes: 0 %
Lymphocytes Relative: 54 %
Lymphs Abs: 2.7 10*3/uL (ref 0.7–4.0)
MCH: 29.7 pg (ref 26.0–34.0)
MCHC: 32.7 g/dL (ref 30.0–36.0)
MCV: 91 fL (ref 80.0–100.0)
Monocytes Absolute: 0.4 10*3/uL (ref 0.1–1.0)
Monocytes Relative: 9 %
Neutro Abs: 1.5 10*3/uL — ABNORMAL LOW (ref 1.7–7.7)
Neutrophils Relative %: 31 %
Platelets: 359 10*3/uL (ref 150–400)
RBC: 4.34 MIL/uL (ref 4.22–5.81)
RDW: 14.1 % (ref 11.5–15.5)
WBC: 4.9 10*3/uL (ref 4.0–10.5)
nRBC: 0 % (ref 0.0–0.2)

## 2019-07-17 MED ORDER — KETOROLAC TROMETHAMINE 30 MG/ML IJ SOLN
15.0000 mg | Freq: Once | INTRAMUSCULAR | Status: AC
  Administered 2019-07-17: 15 mg via INTRAVENOUS
  Filled 2019-07-17: qty 1

## 2019-07-17 MED ORDER — ACETAMINOPHEN 500 MG PO TABS
1000.0000 mg | ORAL_TABLET | Freq: Once | ORAL | Status: AC
  Administered 2019-07-17: 1000 mg via ORAL
  Filled 2019-07-17: qty 2

## 2019-07-17 MED ORDER — ONDANSETRON HCL 4 MG/2ML IJ SOLN
4.0000 mg | Freq: Once | INTRAMUSCULAR | Status: AC
  Administered 2019-07-17: 4 mg via INTRAVENOUS
  Filled 2019-07-17: qty 2

## 2019-07-17 MED ORDER — SODIUM CHLORIDE 0.9 % IV BOLUS
1000.0000 mL | Freq: Once | INTRAVENOUS | Status: AC
  Administered 2019-07-17: 1000 mL via INTRAVENOUS

## 2019-07-17 NOTE — ED Triage Notes (Addendum)
Patient tested positive for COVID-19 last week. Patient states he feels weak and has been having nausea and vomiting. Patient vitals stable. States he does have a cough and shortness of breath on and off

## 2019-07-17 NOTE — ED Provider Notes (Signed)
[facility] Emergency Department Provider Note  ____________________________________________  Time seen: Approximately 11:19 PM  I have reviewed the triage vital signs and the nursing notes.   HISTORY  Chief Complaint Weakness   HPI Mark Wells is a 48 y.o. male with a history of CVA with left-sided hemiparesis, OSA on CPAP, chronic pain syndrome who presents for evaluation of generalized weakness.  Patient was seen here 5 days ago and diagnosed with COVID-19.  He reports that he has been unable to keep anything down.  Has had several daily episodes of nonbloody nonbilious emesis and watery diarrhea.  He is complaining of severe constant generalized body aches.  Has a mild cough and mild shortness of breath.  Shortness of breath is intermittent.  No chest pain.  No abdominal pain.   Past Medical History:  Diagnosis Date  . Anemia   . Anxiety   . Chronic back pain   . Depression   . Hemiplegia affecting left nondominant side (HCC)   . Hyperlipidemia   . Mood disorder (HCC)   . Personality disorder (HCC)   . Restless leg syndrome   . Sleep apnea   . Stroke [name](HCC)     Patient Active Problem List   Diagnosis Date Noted  . OSA on CPAP 12/09/2018  . Chronic pain syndrome 12/09/2018  . Major depression, recurrent, chronic (HCC) 12/09/2018  . Insomnia 12/09/2018  . Spastic hemiplegia of left dominant side as late effect of cerebral infarction (HCC) 12/07/2018  . Painful orthopaedic hardware (HCC) 09/03/2018  . BACK PAIN 01/07/2011  . ADVERSE DRUG REACTION 01/01/2011  . Essential hypertension 12/28/2010  . LOW BACK PAIN SYNDROME, SEVERE 12/28/2010  . OTHER URINARY INCONTINENCE 12/28/2010    Past Surgical History:  Procedure Laterality Date  . BACK SURGERY      Prior to Admission medications   Medication Sig Start Date End Date Taking? Authorizing Provider  amLODipine (NORVASC) 5 MG tablet Take 1 tablet (5 mg total) by mouth daily. 02/26/19    Karamalegos, Netta NeatAlexander J, DO  atorvastatin (LIPITOR) 40 MG tablet Take 1 tablet (40 mg total) by mouth every evening. 06/10/19   Karamalegos, Netta NeatAlexander J, DO  baclofen (LIORESAL) 20 MG tablet Take 20 mg by mouth 4 (four) times daily. 03/21/19   [provider]  chlorthalidone (HYGROTON) 25 MG tablet Take 1 tablet (25 mg total) by mouth daily. 02/26/19   Karamalegos, Netta NeatAlexander J, DO  clopidogrel (PLAVIX) 75 MG tablet Take 1 tablet (75 mg total) by mouth daily. 05/15/19   Karamalegos, Netta NeatAlexander J, DO  guaiFENesin-codeine 100-10 MG/5ML syrup Take 5 mLs by mouth every 6 (six) hours as needed for cough. 07/12/19   Minna AntisPaduchowski, Kevin, MD  losartan (COZAAR) 100 MG tablet Take 1 tablet (100 mg total) by mouth daily. 02/26/19   Karamalegos, Netta NeatAlexander J, DO  meclizine (ANTIVERT) 25 MG tablet Take 1 tablet (25 mg total) by mouth 3 (three) times daily as needed for dizziness. 02/26/19   Karamalegos, Netta NeatAlexander J, DO  methocarbamol (ROBAXIN) 500 MG tablet Take 1 tablet (500 mg total) by mouth every 6 (six) hours as needed for muscle spasms. 02/26/19   Karamalegos, Netta NeatAlexander J, DO  ondansetron (ZOFRAN ODT) 4 MG disintegrating tablet Take 1 tablet (4 mg total) by mouth every 8 (eight) hours as needed. 07/18/19   Nita SickleVeronese, Orchid, MD  ondansetron [facility](ZOFRAN) 4 MG tablet Take 1 tablet (4 mg total) by mouth daily as needed for nausea or vomiting. 07/12/19   Minna AntisPaduchowski, Kevin, MD  oxyCODONE-acetaminophen (PERCOCET) 7.5-325 MG tablet Take 1 tablet by mouth 3 (three) times daily as needed for severe pain.    [provider]  oxyCODONE-acetaminophen (PERCOCET) 7.5-325 MG tablet oxycodone-acetaminophen 7.5 mg-325 mg tablet  TAKE 1 TABLET BY MOUTH EVERY 8 HOURS AS NEEDED PAIN. DO NOT FILL BEFORE 04/29/2019    [provider]  potassium chloride (K-DUR) 10 MEQ tablet Take 1 tablet (10 mEq total) by mouth 2 (two) times daily. 02/26/19   Karamalegos, Netta NeatAlexander J, DO  predniSONE (DELTASONE) 10 MG tablet Take 6 tabs  with breakfast Day 1, 5 tabs Day 2, 4 tabs Day 3, 3 tabs Day 4, 2 tabs Day 5, 1 tab Day 6. 06/27/19   Karamalegos, Netta NeatAlexander J, DO  pregabalin (LYRICA) 300 MG capsule Take 300 mg by mouth 2 (two) times daily.     [provider]    Allergies Aspirin, Ibuprofen, and Shellfish allergy  No family history on file.  Social History Social History   Tobacco Use  . Smoking status: Current Every Day Smoker    Packs/day: 0.50    Types: Cigarettes  . Smokeless tobacco: Current User  Substance Use Topics  . Alcohol use: No  . Drug use: No    Review of Systems  Constitutional: Negative for fever. + Generalized body Eyes: Negative for visual changes. ENT: Negative for sore throat. Neck: No neck pain  Cardiovascular: Negative for chest pain. Respiratory: + shortness of breath, cough Gastrointestinal: Negative for abdominal pain. + vomiting and diarrhea. Genitourinary: Negative for dysuria. Musculoskeletal: Negative for back pain. Skin: Negative for rash. Neurological: Negative for headaches, weakness or numbness. Psych: No SI or HI  ____________________________________________   PHYSICAL EXAM:  VITAL SIGNS: ED Triage Vitals  Enc Vitals Group     BP 07/17/19 2304 125/89     Pulse --      Resp 07/17/19 2304 16     Temp 07/17/19 2304 98.1 F (36.7 C)     Temp Source 07/17/19 2304 Oral     SpO2 07/17/19 2304 99 %     Weight 07/17/19 2305 170 lb (77.1 kg)     Height 07/17/19 2305 5\' 6"  (1.676 m)     Head Circumference --      Peak Flow --      Pain Score 07/17/19 2305 10     Pain Loc --      Pain Edu? --      Excl. in GC? --     Constitutional: Alert and oriented. Well appearing and in no apparent distress. HEENT:      Head: Normocephalic and atraumatic.         Eyes: Conjunctivae are normal. Sclera is non-icteric.       Mouth/Throat: Mucous membranes are moist.       Neck: Supple with no signs of meningismus. Cardiovascular: Regular rate and rhythm. No murmurs,  gallops, or rubs. 2+ symmetrical distal pulses are present in all extremities. No JVD. Respiratory: Normal respiratory effort. Lungs are clear to auscultation bilaterally. No wheezes, crackles, or rhonchi.  Gastrointestinal: Soft, non tender, and non distended with positive bowel sounds. No rebound or guarding. Musculoskeletal: Nontender with normal range of motion in all extremities. No edema, cyanosis, or erythema of extremities. Neurologic: Normal speech and language. Face is symmetric. Moving all extremities. No gross focal neurologic deficits are appreciated. Skin: Skin is warm, dry and intact. No rash noted. Psychiatric: Mood and affect are normal. Speech and behavior are normal.  ____________________________________________  LABS (all labs ordered are listed, but only abnormal results are displayed)  Labs Reviewed  CBC WITH DIFFERENTIAL/PLATELET - Abnormal; Notable for the following components:      Result Value   Hemoglobin 12.9 (*)    Neutro Abs 1.5 (*)    All other components within normal limits  COMPREHENSIVE METABOLIC PANEL - Abnormal; Notable for the following components:   Chloride 112 (*)    Glucose, Bld 112 (*)    Calcium 8.7 (*)    All other components within normal limits  LIPASE, BLOOD  MAGNESIUM  TROPONIN I (HIGH SENSITIVITY)   ____________________________________________  EKG  ED ECG REPORT I, Rudene Re, the attending physician, personally viewed and interpreted this ECG.  Normal sinus rhythm, rate of 70, normal intervals, normal axis, no STE, T wave inversions in inferior leads.  ____________________________________________  RADIOLOGY  none  ____________________________________________   PROCEDURES  Procedure(s) performed: None Procedures Critical Care performed:  None ____________________________________________   INITIAL IMPRESSION / ASSESSMENT AND PLAN / ED COURSE   48 y.o. male with a history of CVA with left-sided hemiparesis,  OSA on CPAP, chronic pain syndrome who presents for evaluation of generalized weakness, N/V/D after being diagnosed with COVID 19, 5 days ago.  Patient is extremely well-appearing in no distress with normal vital signs, normal work of breathing, normal sats, lungs are clear to auscultation, abdomen is soft with no tenderness.  Patient complained of 10 out of 10 generalized body aches.  Has been taking Percocets at home.  Will give IV fluids and Zofran.  Will give Toradol and Tylenol for body aches.  Will check labs to rule out AKI, significant electrolyte abnormalities or dehydration.  Clinical Course as of Jul 17 54  Thu Jul 18, 2019  0053 Labs with no significant abnormalities.  Patient is tolerating p.o. with no further episodes of vomiting.  Will discharge home with Zofran, Tylenol/ibuprofen for body aches, quarantine for COVID, and follow-up with PCP.  Discussed the importance of buying a pulse oximeter to check his oxygenation.  Patient remains with no oxygen requirement satting 100% on room air with normal work of breathing   [CV]    Clinical Course User Index [CV] Alfred Levins, [place], MD      As part of my medical decision making, I reviewed the following data within the [facility] notes reviewed and incorporated, Labs reviewed , EKG interpreted , Old EKG reviewed, Old chart reviewed, Notes from prior ED visits and [place] Controlled Substance Database   Patient was evaluated in Emergency Department today for the symptoms described in the history of present illness. Patient was evaluated in the context of the global COVID-19 pandemic, which necessitated consideration that the patient might be at risk for infection with the SARS-CoV-2 virus that causes COVID-19. Institutional protocols and algorithms that pertain to the evaluation of patients at risk for COVID-19 are in a state of rapid change based on information released by regulatory bodies including the CDC and federal  and state organizations. These policies and algorithms were followed during the patient's care in the ED.   ____________________________________________   FINAL CLINICAL IMPRESSION(S) / ED DIAGNOSES   Final diagnoses:  COVID-19 virus infection  Nausea vomiting and diarrhea      NEW MEDICATIONS STARTED DURING THIS VISIT:  ED Discharge Orders         Ordered    ondansetron (ZOFRAN ODT) 4 MG disintegrating tablet  Every 8 hours PRN     07/18/19 0054  Note:  This document was prepared using Dragon voice recognition software and may include unintentional dictation errors.    Don PerkingVeronese, [place], MD 07/18/19 [phone]

## 2019-07-18 LAB — MAGNESIUM: Magnesium: 2.2 mg/dL (ref 1.7–2.4)

## 2019-07-18 LAB — COMPREHENSIVE METABOLIC PANEL
ALT: 19 U/L (ref 0–44)
AST: 17 U/L (ref 15–41)
Albumin: 3.8 g/dL (ref 3.5–5.0)
Alkaline Phosphatase: 80 U/L (ref 38–126)
Anion gap: 5 (ref 5–15)
BUN: 11 mg/dL (ref 6–20)
CO2: 25 mmol/L (ref 22–32)
Calcium: 8.7 mg/dL — ABNORMAL LOW (ref 8.9–10.3)
Chloride: 112 mmol/L — ABNORMAL HIGH (ref 98–111)
Creatinine, Ser: 0.61 mg/dL (ref 0.61–1.24)
GFR calc Af Amer: >60 mL/min (ref >60)
GFR calc non Af Amer: >60 mL/min (ref >60)
Glucose, Bld: 112 mg/dL — ABNORMAL HIGH (ref 70–99)
Potassium: 3.6 mmol/L (ref 3.5–5.1)
Sodium: 142 mmol/L (ref 135–145)
Total Bilirubin: 0.7 mg/dL (ref 0.3–1.2)
Total Protein: 6.7 g/dL (ref 6.5–8.1)

## 2019-07-18 LAB — TROPONIN I (HIGH SENSITIVITY): Troponin I (High Sensitivity): 5 ng/L (ref <18)

## 2019-07-18 LAB — LIPASE, BLOOD: Lipase: 34 U/L (ref 11–51)

## 2019-07-18 MED ORDER — ONDANSETRON 4 MG PO TBDP: 4.0000 mg | ORAL_TABLET | Freq: Three times a day (TID) | ORAL | 0 refills | Status: DC | PRN

## 2019-07-18 NOTE — ED Notes (Signed)
Patient given grape juice and graham crackers per patient request

## 2019-07-18 NOTE — Discharge Instructions (Signed)

## 2019-07-24 DIAGNOSIS — R498 Other voice and resonance disorders: Secondary | ICD-10-CM | POA: Diagnosis not present

## 2019-07-24 DIAGNOSIS — I69354 Hemiplegia and hemiparesis following cerebral infarction affecting left non-dominant side: Secondary | ICD-10-CM | POA: Diagnosis not present

## 2019-07-24 DIAGNOSIS — I10 Essential (primary) hypertension: Secondary | ICD-10-CM | POA: Diagnosis not present

## 2019-07-24 DIAGNOSIS — M1990 Unspecified osteoarthritis, unspecified site: Secondary | ICD-10-CM | POA: Diagnosis not present

## 2019-07-24 DIAGNOSIS — M5106 Intervertebral disc disorders with myelopathy, lumbar region: Secondary | ICD-10-CM | POA: Diagnosis not present

## 2019-07-24 DIAGNOSIS — M5136 Other intervertebral disc degeneration, lumbar region: Secondary | ICD-10-CM | POA: Diagnosis not present

## 2019-07-24 DIAGNOSIS — F338 Other recurrent depressive disorders: Secondary | ICD-10-CM | POA: Diagnosis not present

## 2019-07-24 DIAGNOSIS — G894 Chronic pain syndrome: Secondary | ICD-10-CM | POA: Diagnosis not present

## 2019-07-24 DIAGNOSIS — G4733 Obstructive sleep apnea (adult) (pediatric): Secondary | ICD-10-CM | POA: Diagnosis not present

## 2019-07-25 DIAGNOSIS — G894 Chronic pain syndrome: Secondary | ICD-10-CM | POA: Diagnosis not present

## 2019-07-25 DIAGNOSIS — M5136 Other intervertebral disc degeneration, lumbar region: Secondary | ICD-10-CM | POA: Diagnosis not present

## 2019-07-25 DIAGNOSIS — M5106 Intervertebral disc disorders with myelopathy, lumbar region: Secondary | ICD-10-CM | POA: Diagnosis not present

## 2019-07-25 DIAGNOSIS — I69354 Hemiplegia and hemiparesis following cerebral infarction affecting left non-dominant side: Secondary | ICD-10-CM | POA: Diagnosis not present

## 2019-07-25 DIAGNOSIS — G4733 Obstructive sleep apnea (adult) (pediatric): Secondary | ICD-10-CM | POA: Diagnosis not present

## 2019-07-25 DIAGNOSIS — I10 Essential (primary) hypertension: Secondary | ICD-10-CM | POA: Diagnosis not present

## 2019-07-25 DIAGNOSIS — M1990 Unspecified osteoarthritis, unspecified site: Secondary | ICD-10-CM | POA: Diagnosis not present

## 2019-07-25 DIAGNOSIS — F338 Other recurrent depressive disorders: Secondary | ICD-10-CM | POA: Diagnosis not present

## 2019-07-25 DIAGNOSIS — R498 Other voice and resonance disorders: Secondary | ICD-10-CM | POA: Diagnosis not present

## 2019-07-31 DIAGNOSIS — G894 Chronic pain syndrome: Secondary | ICD-10-CM | POA: Diagnosis not present

## 2019-07-31 DIAGNOSIS — I10 Essential (primary) hypertension: Secondary | ICD-10-CM | POA: Diagnosis not present

## 2019-07-31 DIAGNOSIS — R498 Other voice and resonance disorders: Secondary | ICD-10-CM | POA: Diagnosis not present

## 2019-07-31 DIAGNOSIS — M5106 Intervertebral disc disorders with myelopathy, lumbar region: Secondary | ICD-10-CM | POA: Diagnosis not present

## 2019-07-31 DIAGNOSIS — I69354 Hemiplegia and hemiparesis following cerebral infarction affecting left non-dominant side: Secondary | ICD-10-CM | POA: Diagnosis not present

## 2019-07-31 DIAGNOSIS — F338 Other recurrent depressive disorders: Secondary | ICD-10-CM | POA: Diagnosis not present

## 2019-07-31 DIAGNOSIS — M5136 Other intervertebral disc degeneration, lumbar region: Secondary | ICD-10-CM | POA: Diagnosis not present

## 2019-07-31 DIAGNOSIS — G4733 Obstructive sleep apnea (adult) (pediatric): Secondary | ICD-10-CM | POA: Diagnosis not present

## 2019-07-31 DIAGNOSIS — M1990 Unspecified osteoarthritis, unspecified site: Secondary | ICD-10-CM | POA: Diagnosis not present

## 2019-08-02 ENCOUNTER — Telehealth: Payer: Self-pay | Admitting: Family Medicine

## 2019-08-02 DIAGNOSIS — R498 Other voice and resonance disorders: Secondary | ICD-10-CM | POA: Diagnosis not present

## 2019-08-02 DIAGNOSIS — M5106 Intervertebral disc disorders with myelopathy, lumbar region: Secondary | ICD-10-CM | POA: Diagnosis not present

## 2019-08-02 DIAGNOSIS — I69354 Hemiplegia and hemiparesis following cerebral infarction affecting left non-dominant side: Secondary | ICD-10-CM | POA: Diagnosis not present

## 2019-08-02 DIAGNOSIS — M5136 Other intervertebral disc degeneration, lumbar region: Secondary | ICD-10-CM | POA: Diagnosis not present

## 2019-08-02 DIAGNOSIS — G4733 Obstructive sleep apnea (adult) (pediatric): Secondary | ICD-10-CM | POA: Diagnosis not present

## 2019-08-02 DIAGNOSIS — G894 Chronic pain syndrome: Secondary | ICD-10-CM | POA: Diagnosis not present

## 2019-08-02 DIAGNOSIS — I10 Essential (primary) hypertension: Secondary | ICD-10-CM | POA: Diagnosis not present

## 2019-08-02 DIAGNOSIS — M1990 Unspecified osteoarthritis, unspecified site: Secondary | ICD-10-CM | POA: Diagnosis not present

## 2019-08-02 DIAGNOSIS — F338 Other recurrent depressive disorders: Secondary | ICD-10-CM | POA: Diagnosis not present

## 2019-08-02 NOTE — Telephone Encounter (Signed)
As per Cecille Rubin Dr.K just need to write Rx for hospital bed with diagnosis, office notes, demographic, insurance card, also she will call next week for power chair it was done in past but that provider was out of net work now she found out about in net work and will fax forms next week. He also got approved for personal care services through medicaid now.

## 2019-08-02 NOTE — Telephone Encounter (Signed)
Handwritten rx for hospital bed. To be faxed  Nobie Putnam, [facility] 08/02/2019, 3:42 PM

## 2019-08-02 NOTE — Telephone Encounter (Signed)
Cecille Rubin with [facility] called asking for a hospital bed for patient.  She wants to order it from [facility] phone #  [phone]   Fax  [phone]  Attn Pam  This provider is in pt's network  [facility] CB#  [phone]  Thanks  Con Memos

## 2019-08-05 NOTE — Telephone Encounter (Signed)
Faxed on 08/02/2019.

## 2019-08-06 DIAGNOSIS — M5106 Intervertebral disc disorders with myelopathy, lumbar region: Secondary | ICD-10-CM | POA: Diagnosis not present

## 2019-08-06 DIAGNOSIS — G4733 Obstructive sleep apnea (adult) (pediatric): Secondary | ICD-10-CM | POA: Diagnosis not present

## 2019-08-06 DIAGNOSIS — R498 Other voice and resonance disorders: Secondary | ICD-10-CM | POA: Diagnosis not present

## 2019-08-06 DIAGNOSIS — I69354 Hemiplegia and hemiparesis following cerebral infarction affecting left non-dominant side: Secondary | ICD-10-CM | POA: Diagnosis not present

## 2019-08-06 DIAGNOSIS — G894 Chronic pain syndrome: Secondary | ICD-10-CM | POA: Diagnosis not present

## 2019-08-06 DIAGNOSIS — M1990 Unspecified osteoarthritis, unspecified site: Secondary | ICD-10-CM | POA: Diagnosis not present

## 2019-08-06 DIAGNOSIS — M5136 Other intervertebral disc degeneration, lumbar region: Secondary | ICD-10-CM | POA: Diagnosis not present

## 2019-08-06 DIAGNOSIS — I10 Essential (primary) hypertension: Secondary | ICD-10-CM | POA: Diagnosis not present

## 2019-08-06 DIAGNOSIS — F338 Other recurrent depressive disorders: Secondary | ICD-10-CM | POA: Diagnosis not present

## 2019-08-08 DIAGNOSIS — G4733 Obstructive sleep apnea (adult) (pediatric): Secondary | ICD-10-CM | POA: Diagnosis not present

## 2019-08-08 DIAGNOSIS — M5136 Other intervertebral disc degeneration, lumbar region: Secondary | ICD-10-CM | POA: Diagnosis not present

## 2019-08-08 DIAGNOSIS — F338 Other recurrent depressive disorders: Secondary | ICD-10-CM | POA: Diagnosis not present

## 2019-08-08 DIAGNOSIS — I10 Essential (primary) hypertension: Secondary | ICD-10-CM | POA: Diagnosis not present

## 2019-08-08 DIAGNOSIS — M1990 Unspecified osteoarthritis, unspecified site: Secondary | ICD-10-CM | POA: Diagnosis not present

## 2019-08-08 DIAGNOSIS — R498 Other voice and resonance disorders: Secondary | ICD-10-CM | POA: Diagnosis not present

## 2019-08-08 DIAGNOSIS — M5106 Intervertebral disc disorders with myelopathy, lumbar region: Secondary | ICD-10-CM | POA: Diagnosis not present

## 2019-08-08 DIAGNOSIS — G894 Chronic pain syndrome: Secondary | ICD-10-CM | POA: Diagnosis not present

## 2019-08-08 DIAGNOSIS — I69354 Hemiplegia and hemiparesis following cerebral infarction affecting left non-dominant side: Secondary | ICD-10-CM | POA: Diagnosis not present

## 2019-08-12 ENCOUNTER — Telehealth: Payer: Self-pay | Admitting: Family Medicine

## 2019-08-12 DIAGNOSIS — I6930 Unspecified sequelae of cerebral infarction: Secondary | ICD-10-CM

## 2019-08-12 DIAGNOSIS — E782 Mixed hyperlipidemia: Secondary | ICD-10-CM

## 2019-08-12 MED ORDER — ATORVASTATIN CALCIUM 40 MG PO TABS: 40.0000 mg | ORAL_TABLET | Freq: Every evening | ORAL | 1 refills | Status: DC

## 2019-08-12 MED ORDER — CLOPIDOGREL BISULFATE 75 MG PO TABS: 75.0000 mg | ORAL_TABLET | Freq: Every day | ORAL | 1 refills | Status: DC

## 2019-08-12 NOTE — Telephone Encounter (Addendum)
updated

## 2019-08-12 NOTE — Telephone Encounter (Signed)
Sent refills - for cholesterol and blood thinner.  He should follow up with his [facility] for cramping medicine. Or he can try OTC options.  I do not have any rx cramping medication.  - OTC natural option is Hyland's Leg Cramps (Dissolving tablet) take as needed for muscle cramps  - Try spoonful of yellow mustard to relieve leg cramps or try daily to prevent the problem  Nobie Putnam, DO [facility] 08/12/2019, 12:40 PM

## 2019-08-12 NOTE — Telephone Encounter (Signed)
Pt called reusting  Refill on  Cholesterol  Medication, Blood thinner  Medication.Mark KitchenMarland KitchenPt also is requesting something to  Be called in for his cramps(hand,legs) called into  [facility] . Pt call back # is  [phone]

## 2019-08-13 DIAGNOSIS — F338 Other recurrent depressive disorders: Secondary | ICD-10-CM | POA: Diagnosis not present

## 2019-08-13 DIAGNOSIS — M5106 Intervertebral disc disorders with myelopathy, lumbar region: Secondary | ICD-10-CM | POA: Diagnosis not present

## 2019-08-13 DIAGNOSIS — I10 Essential (primary) hypertension: Secondary | ICD-10-CM | POA: Diagnosis not present

## 2019-08-13 DIAGNOSIS — G894 Chronic pain syndrome: Secondary | ICD-10-CM | POA: Diagnosis not present

## 2019-08-13 DIAGNOSIS — M1990 Unspecified osteoarthritis, unspecified site: Secondary | ICD-10-CM | POA: Diagnosis not present

## 2019-08-13 DIAGNOSIS — R498 Other voice and resonance disorders: Secondary | ICD-10-CM | POA: Diagnosis not present

## 2019-08-13 DIAGNOSIS — M5136 Other intervertebral disc degeneration, lumbar region: Secondary | ICD-10-CM | POA: Diagnosis not present

## 2019-08-13 DIAGNOSIS — I69354 Hemiplegia and hemiparesis following cerebral infarction affecting left non-dominant side: Secondary | ICD-10-CM | POA: Diagnosis not present

## 2019-08-13 DIAGNOSIS — G4733 Obstructive sleep apnea (adult) (pediatric): Secondary | ICD-10-CM | POA: Diagnosis not present

## 2019-08-14 ENCOUNTER — Telehealth: Payer: Self-pay | Admitting: Family Medicine

## 2019-08-14 DIAGNOSIS — M533 Sacrococcygeal disorders, not elsewhere classified: Secondary | ICD-10-CM | POA: Diagnosis not present

## 2019-08-14 DIAGNOSIS — G5602 Carpal tunnel syndrome, left upper limb: Secondary | ICD-10-CM | POA: Diagnosis not present

## 2019-08-14 NOTE — Telephone Encounter (Signed)
As per Cecille Rubin DME vendor wants more information in medical neccessity letter stating like he is on lots of pain, difficult for him to get in and out, hemiplegia, severe lower back pain, weaken strengtheining. Will call DME vendor to fax forms that can have specific guidelines.

## 2019-08-14 NOTE — Telephone Encounter (Signed)
Cecille Rubin called [phone] requesting a call back, they are needing additional information for pt hospital bed.

## 2019-08-15 DIAGNOSIS — G5602 Carpal tunnel syndrome, left upper limb: Secondary | ICD-10-CM | POA: Diagnosis not present

## 2019-08-16 ENCOUNTER — Other Ambulatory Visit: Payer: Self-pay | Admitting: Family Medicine

## 2019-08-16 DIAGNOSIS — G894 Chronic pain syndrome: Secondary | ICD-10-CM

## 2019-08-16 DIAGNOSIS — G8929 Other chronic pain: Secondary | ICD-10-CM

## 2019-08-16 NOTE — Telephone Encounter (Signed)
Addend note 02/2019 within 6 months, added info as requested, to be faxed back.  Mark Wells, [name] 08/16/2019, 5:31 PM

## 2019-08-20 ENCOUNTER — Ambulatory Visit (INDEPENDENT_AMBULATORY_CARE_PROVIDER_SITE_OTHER): Payer: Medicare HMO | Admitting: Family Medicine

## 2019-08-20 ENCOUNTER — Other Ambulatory Visit: Payer: Self-pay

## 2019-08-20 ENCOUNTER — Encounter: Payer: Self-pay | Admitting: Family Medicine

## 2019-08-20 DIAGNOSIS — R498 Other voice and resonance disorders: Secondary | ICD-10-CM | POA: Diagnosis not present

## 2019-08-20 DIAGNOSIS — M1990 Unspecified osteoarthritis, unspecified site: Secondary | ICD-10-CM | POA: Diagnosis not present

## 2019-08-20 DIAGNOSIS — M5106 Intervertebral disc disorders with myelopathy, lumbar region: Secondary | ICD-10-CM | POA: Diagnosis not present

## 2019-08-20 DIAGNOSIS — I69354 Hemiplegia and hemiparesis following cerebral infarction affecting left non-dominant side: Secondary | ICD-10-CM | POA: Diagnosis not present

## 2019-08-20 DIAGNOSIS — F338 Other recurrent depressive disorders: Secondary | ICD-10-CM | POA: Diagnosis not present

## 2019-08-20 DIAGNOSIS — G4733 Obstructive sleep apnea (adult) (pediatric): Secondary | ICD-10-CM | POA: Diagnosis not present

## 2019-08-20 DIAGNOSIS — N3946 Mixed incontinence: Secondary | ICD-10-CM | POA: Diagnosis not present

## 2019-08-20 DIAGNOSIS — M5136 Other intervertebral disc degeneration, lumbar region: Secondary | ICD-10-CM | POA: Diagnosis not present

## 2019-08-20 DIAGNOSIS — I10 Essential (primary) hypertension: Secondary | ICD-10-CM | POA: Diagnosis not present

## 2019-08-20 DIAGNOSIS — G894 Chronic pain syndrome: Secondary | ICD-10-CM | POA: Diagnosis not present

## 2019-08-20 DIAGNOSIS — I69353 Hemiplegia and hemiparesis following cerebral infarction affecting right non-dominant side: Secondary | ICD-10-CM | POA: Diagnosis not present

## 2019-08-20 NOTE — Progress Notes (Signed)
Attempted to call patient at time of appointment and did not reach him, unable to leave voicemail, I re-attempted after his apt time and again did not reach. Unable to conduct virtual visit today due to unable to reach patient.  He initially spoke with office staff for clinical intake and nursing documentation on chart, but no medical advice or treatment or orders placed today.  Chief Complaint  Patient presents with  . Back Pain    referral for pain management   Mark Wells, [facility] 08/20/2019, 4:54 PM

## 2019-08-21 DIAGNOSIS — M545 Low back pain: Secondary | ICD-10-CM | POA: Diagnosis not present

## 2019-08-23 DIAGNOSIS — G4733 Obstructive sleep apnea (adult) (pediatric): Secondary | ICD-10-CM | POA: Diagnosis not present

## 2019-08-23 DIAGNOSIS — M5106 Intervertebral disc disorders with myelopathy, lumbar region: Secondary | ICD-10-CM | POA: Diagnosis not present

## 2019-08-23 DIAGNOSIS — M1990 Unspecified osteoarthritis, unspecified site: Secondary | ICD-10-CM | POA: Diagnosis not present

## 2019-08-23 DIAGNOSIS — R498 Other voice and resonance disorders: Secondary | ICD-10-CM | POA: Diagnosis not present

## 2019-08-23 DIAGNOSIS — G894 Chronic pain syndrome: Secondary | ICD-10-CM | POA: Diagnosis not present

## 2019-08-23 DIAGNOSIS — I69354 Hemiplegia and hemiparesis following cerebral infarction affecting left non-dominant side: Secondary | ICD-10-CM | POA: Diagnosis not present

## 2019-08-23 DIAGNOSIS — F338 Other recurrent depressive disorders: Secondary | ICD-10-CM | POA: Diagnosis not present

## 2019-08-23 DIAGNOSIS — I10 Essential (primary) hypertension: Secondary | ICD-10-CM | POA: Diagnosis not present

## 2019-08-23 DIAGNOSIS — M5136 Other intervertebral disc degeneration, lumbar region: Secondary | ICD-10-CM | POA: Diagnosis not present

## 2019-09-03 ENCOUNTER — Telehealth: Payer: Self-pay | Admitting: Family Medicine

## 2019-09-03 NOTE — Telephone Encounter (Signed)
Pt called requesting refill on  BP  medcation

## 2019-09-04 NOTE — Telephone Encounter (Signed)
Last V V was no show and he is requesting refills. Pls advise if he needs OV first.

## 2019-09-04 NOTE — Telephone Encounter (Signed)
For BP medication, I can refill now - want me to send all BP meds or any in particular he needs?  For other medications he would need visit.  Nobie Putnam, DO [facility] 09/04/2019, 11:15 AM

## 2019-09-04 NOTE — Telephone Encounter (Signed)
Called patient he is actually had some refill left so no need to refill any medication but wanted to schedule virtual appt regarding his back pain and wants a referral for pain management.

## 2019-09-06 ENCOUNTER — Ambulatory Visit: Payer: Medicare HMO | Admitting: Family Medicine

## 2019-09-26 ENCOUNTER — Telehealth: Payer: Self-pay

## 2019-09-26 NOTE — Telephone Encounter (Signed)
As per [employer] changed and he wants to get power mobility chair approval from new [employer] [phone] and fax number is [phone]. Left message for company for more detail information.

## 2019-10-17 ENCOUNTER — Other Ambulatory Visit: Payer: Self-pay | Admitting: Family Medicine

## 2019-10-17 DIAGNOSIS — I10 Essential (primary) hypertension: Secondary | ICD-10-CM

## 2019-10-18 ENCOUNTER — Emergency Department: Payer: Medicare Other

## 2019-10-18 ENCOUNTER — Emergency Department
Admission: EM | Admit: 2019-10-18 | Discharge: 2019-10-18 | Disposition: A | Discharge status: Home / Self Care | Payer: Medicare Other | Attending: Emergency Medicine | Admitting: Emergency Medicine

## 2019-10-18 ENCOUNTER — Other Ambulatory Visit: Payer: Self-pay

## 2019-10-18 DIAGNOSIS — F17210 Nicotine dependence, cigarettes, uncomplicated: Secondary | ICD-10-CM | POA: Insufficient documentation

## 2019-10-18 DIAGNOSIS — W19XXXA Unspecified fall, initial encounter: Secondary | ICD-10-CM | POA: Insufficient documentation

## 2019-10-18 DIAGNOSIS — Z79899 Other long term (current) drug therapy: Secondary | ICD-10-CM | POA: Diagnosis not present

## 2019-10-18 DIAGNOSIS — G894 Chronic pain syndrome: Secondary | ICD-10-CM

## 2019-10-18 DIAGNOSIS — I10 Essential (primary) hypertension: Secondary | ICD-10-CM | POA: Diagnosis not present

## 2019-10-18 DIAGNOSIS — Z8673 Personal history of transient ischemic attack (TIA), and cerebral infarction without residual deficits: Secondary | ICD-10-CM | POA: Insufficient documentation

## 2019-10-18 DIAGNOSIS — M25551 Pain in right hip: Secondary | ICD-10-CM | POA: Diagnosis not present

## 2019-10-18 MED ORDER — OXYCODONE-ACETAMINOPHEN 5-325 MG PO TABS
1.0000 | ORAL_TABLET | Freq: Once | ORAL | Status: AC
  Administered 2019-10-18: 1 via ORAL
  Filled 2019-10-18: qty 1

## 2019-10-18 MED ORDER — LIDOCAINE 5 % EX PTCH: 1.0000 | MEDICATED_PATCH | Freq: Two times a day (BID) | CUTANEOUS | 0 refills | Status: DC

## 2019-10-18 MED ORDER — OXYCODONE-ACETAMINOPHEN 5-325 MG PO TABS
1.0000 | ORAL_TABLET | Freq: Once | ORAL | Status: AC
  Administered 2019-10-18: 21:00:00 1 via ORAL
  Filled 2019-10-18: qty 1

## 2019-10-18 MED ORDER — LIDOCAINE 5 % EX PTCH
1.0000 | MEDICATED_PATCH | CUTANEOUS | Status: DC
  Administered 2019-10-18: 1 via TRANSDERMAL
  Filled 2019-10-18: qty 1

## 2019-10-18 NOTE — ED Triage Notes (Signed)
Patient arrived from home by [facility] due to recent fall and right side hip pain. Patient states he has had multiple falls with last one causing pain on right side. Patient has history of stroke affecting left side. Patient states pain is 10/10 that is stabbing.  Patient uses walker.

## 2019-10-18 NOTE — ED Provider Notes (Signed)
[facility] Emergency Department Provider Note   ____________________________________________   First MD Initiated Contact with Patient 10/18/19 1905     (approximate)  I have reviewed the triage vital signs and the nursing notes.   HISTORY  Chief Complaint Fall and Hip Pain    HPI Mark Wells is a 48 y.o. male with past medical history of chronic back pain and stroke with left-sided deficits presents to the ED complaining of hip and back pain.  Patient reports that he has had his left leg give out on him multiple times today while walking with a walker.  This caused him to fall to his right side and onto his right hip.  He denies hitting his head or losing consciousness.  He does complain of significant pain in his lower back and into his right hip.  He denies any new numbness or weakness to his lower extremities and has not had any saddle anesthesia or difficulty urinating.  He states he had been taking Percocet for his pain up until 2 days ago, when he ran out.        Past Medical History:  Diagnosis Date  . Anemia   . Anxiety   . Chronic back pain   . Depression   . Hemiplegia affecting left nondominant side (HCC)   . Hyperlipidemia   . Mood disorder (HCC)   . Personality disorder (HCC)   . Restless leg syndrome   . Sleep apnea   . Stroke [facility])     Patient Active Problem List   Diagnosis Date Noted  . OSA on CPAP 12/09/2018  . Chronic pain syndrome 12/09/2018  . Major depression, recurrent, chronic (HCC) 12/09/2018  . Insomnia 12/09/2018  . Spastic hemiplegia of left dominant side as late effect of cerebral infarction (HCC) 12/07/2018  . Painful orthopaedic hardware (HCC) 09/03/2018  . BACK PAIN 01/07/2011  . ADVERSE DRUG REACTION 01/01/2011  . Essential hypertension 12/28/2010  . LOW BACK PAIN SYNDROME, SEVERE 12/28/2010  . OTHER URINARY INCONTINENCE 12/28/2010    Past Surgical History:  Procedure Laterality Date  . BACK  SURGERY      Prior to Admission medications   Medication Sig Start Date End Date Taking? Authorizing Provider  amLODipine (NORVASC) 5 MG tablet TAKE 1 TABLET BY MOUTH EVERY DAY 10/17/19   Karamalegos, Netta Neat, DO  atorvastatin (LIPITOR) 40 MG tablet Take 1 tablet (40 mg total) by mouth every evening. 08/12/19   Karamalegos, Netta Neat, DO  baclofen (LIORESAL) 20 MG tablet Take 20 mg by mouth 4 (four) times daily. 03/21/19   [provider]  chlorthalidone (HYGROTON) 25 MG tablet TAKE 1 TABLET BY MOUTH EVERY DAY 10/17/19   Althea Charon, Netta Neat, DO  clopidogrel (PLAVIX) 75 MG tablet Take 1 tablet (75 mg total) by mouth daily. 08/12/19   Karamalegos, Netta Neat, DO  lidocaine (LIDODERM) 5 % Place 1 patch onto the skin every 12 (twelve) hours. Remove & Discard patch within 12 hours or as directed by MD 10/18/19 10/17/20  Chesley Noon, MD  losartan (COZAAR) 100 MG tablet Take 1 tablet (100 mg total) by mouth daily. 02/26/19   Karamalegos, Netta Neat, DO  meclizine (ANTIVERT) 25 MG tablet Take 1 tablet (25 mg total) by mouth 3 (three) times daily as needed for dizziness. 02/26/19   Karamalegos, Netta Neat, DO  methocarbamol (ROBAXIN) 500 MG tablet Take 1 tablet (500 mg total) by mouth every 6 (six) hours as needed for muscle spasms. 02/26/19   Karamalegos,  Alexander J, DO  ondansetron (ZOFRAN ODT) 4 MG disintegrating tablet Take 1 tablet (4 mg total) by mouth every 8 (eight) hours as needed. 07/18/19   Rudene Re, MD  oxyCODONE-acetaminophen (PERCOCET) 7.5-325 MG tablet Take 1 tablet by mouth 3 (three) times daily as needed for severe pain.    [provider]  oxyCODONE-acetaminophen (PERCOCET) 7.5-325 MG tablet oxycodone-acetaminophen 7.5 mg-325 mg tablet  TAKE 1 TABLET BY MOUTH EVERY 8 HOURS AS NEEDED PAIN. DO NOT FILL BEFORE 04/29/2019    [provider]  potassium chloride (K-DUR) 10 MEQ tablet Take 1 tablet (10 mEq total) by mouth 2 (two) times daily. 02/26/19    Karamalegos, Devonne Doughty, DO  predniSONE (DELTASONE) 10 MG tablet Take 6 tabs with breakfast Day 1, 5 tabs Day 2, 4 tabs Day 3, 3 tabs Day 4, 2 tabs Day 5, 1 tab Day 6. Patient not taking: Reported on 08/20/2019 06/27/19   Olin Hauser, DO  pregabalin (LYRICA) 300 MG capsule Take 300 mg by mouth 2 (two) times daily.     [provider]    Allergies Aspirin, Ibuprofen, and Shellfish allergy  History reviewed. No pertinent family history.  Social History Social History   Tobacco Use  . Smoking status: Current Every Day Smoker    Packs/day: 0.50    Types: Cigarettes  . Smokeless tobacco: Current User  Substance Use Topics  . Alcohol use: No  . Drug use: No    Review of Systems  Constitutional: No fever/chills Eyes: No visual changes. ENT: No sore throat. Cardiovascular: Denies chest pain. Respiratory: Denies shortness of breath. Gastrointestinal: No abdominal pain.  No nausea, no vomiting.  No diarrhea.  No constipation. Genitourinary: Negative for dysuria. Musculoskeletal: Positive for back pain and right hip pain. Skin: Negative for rash. Neurological: Negative for headaches, focal weakness or numbness.  ____________________________________________   PHYSICAL EXAM:  VITAL SIGNS: ED Triage Vitals  Enc Vitals Group     BP 10/18/19 1914 (!) 131/104     Pulse Rate 10/18/19 1914 71     Resp 10/18/19 1914 20     Temp 10/18/19 1914 98.8 F (37.1 C)     Temp Source 10/18/19 1914 Oral     SpO2 10/18/19 1912 100 %     Weight 10/18/19 1915 170 lb (77.1 kg)     Height 10/18/19 1915 5\' 6"  (1.676 m)     Head Circumference --      Peak Flow --      Pain Score 10/18/19 1915 10     Pain Loc --      Pain Edu? --      Excl. in [place]? --     Constitutional: Alert and oriented. Eyes: Conjunctivae are normal. Head: Atraumatic. Nose: No congestion/rhinnorhea. Mouth/Throat: Mucous membranes are moist. Neck: Normal ROM Cardiovascular: Normal rate, regular  rhythm. Grossly normal heart sounds. Respiratory: Normal respiratory effort.  No retractions. Lungs CTAB. Gastrointestinal: Soft and nontender. No distention. Genitourinary: deferred Musculoskeletal: Tenderness to palpation over midline lumbar spine and right hip, no tenderness to bilateral knees, ankles, or left hip. Neurologic:  Normal speech and language. No gross focal neurologic deficits are appreciated.  Strength 5 out of 5 in right lower extremity, 4 out of 5 in left lower extremity.  Sensation intact throughout bilateral lower extremities. Skin:  Skin is warm, dry and intact. No rash noted. Psychiatric: Mood and affect are normal. Speech and behavior are normal.  ____________________________________________   LABS (all labs ordered are listed,  but only abnormal results are displayed)  Labs Reviewed - No data to display   PROCEDURES  Procedure(s) performed (including Critical Care):  Procedures   ____________________________________________   INITIAL IMPRESSION / ASSESSMENT AND PLAN / ED COURSE       48 year old male presents to the ED complaining of increasing pain to his lower back and right hip following multiple falls today.  Falls appear secondary to known weakness to his left lower extremity for which patient walks with a walker.  No new weakness appreciated on exam.  Doubt cauda equina given his strength is intact, no saddle anesthesia, and no urinary retention/incontinence.  No other red flag symptoms regarding his back pain.  X-ray of lumbar spine is negative for acute process, films of right hip showed questionable fracture however CT negative for acute process.  Suspect much of patient's complaints are chronic in nature, would avoid prescribing narcotic pain medicine in this patient.  He was provided with prescription for Lidoderm patches and counseled to follow-up with his PCP.  Patient agrees with plan.       ____________________________________________   FINAL CLINICAL IMPRESSION(S) / ED DIAGNOSES  Final diagnoses:  Fall, initial encounter  Right hip pain  Chronic pain syndrome  History of stroke     ED Discharge Orders         Ordered    lidocaine (LIDODERM) 5 %  Every 12 hours     10/18/19 2056           Note:  This document was prepared using Dragon voice recognition software and may include unintentional dictation errors.   Chesley NoonJessup, Kearstyn Avitia, MD 10/18/19 2103

## 2019-10-23 ENCOUNTER — Encounter: Payer: Self-pay | Admitting: Family Medicine

## 2019-10-23 ENCOUNTER — Ambulatory Visit (INDEPENDENT_AMBULATORY_CARE_PROVIDER_SITE_OTHER): Payer: Medicare Other | Admitting: Family Medicine

## 2019-10-23 ENCOUNTER — Other Ambulatory Visit: Payer: Self-pay

## 2019-10-23 DIAGNOSIS — I69352 Hemiplegia and hemiparesis following cerebral infarction affecting left dominant side: Secondary | ICD-10-CM | POA: Diagnosis not present

## 2019-10-23 DIAGNOSIS — G8929 Other chronic pain: Secondary | ICD-10-CM

## 2019-10-23 DIAGNOSIS — M545 Low back pain: Secondary | ICD-10-CM | POA: Diagnosis not present

## 2019-10-23 DIAGNOSIS — G894 Chronic pain syndrome: Secondary | ICD-10-CM

## 2019-10-23 DIAGNOSIS — T8484XA Pain due to internal orthopedic prosthetic devices, implants and grafts, initial encounter: Secondary | ICD-10-CM

## 2019-10-23 DIAGNOSIS — F339 Major depressive disorder, recurrent, unspecified: Secondary | ICD-10-CM | POA: Diagnosis not present

## 2019-10-23 MED ORDER — PREDNISONE 20 MG PO TABS: ORAL_TABLET | ORAL | 0 refills | Status: DC

## 2019-10-23 NOTE — Progress Notes (Signed)
Virtual Visit via Telephone The purpose of this virtual visit is to provide medical care while limiting exposure to the novel coronavirus (COVID19) for both patient and office staff.  Consent was obtained for phone visit:  Yes.   Answered questions that patient had about telehealth interaction:  Yes.   I discussed the limitations, risks, security and privacy concerns of performing an evaluation and management service by telephone. I also discussed with the patient that there may be a patient responsible charge related to this service. The patient expressed understanding and agreed to proceed.  Patient Location: Home Provider Location: [facility])  ---------------------------------------------------------------------- Chief Complaint  Patient presents with  . Pain    needed referral for PT   . Spastic Hemiplegia weakness    S: Reviewed CMA documentation. I have called patient and gathered additional HPI as follows:  Chronic Pain Syndrome / Osteoarthritis Multiple Joints / Lumbar Spine DJD disc herniation L5 History of disc herniation 2006, he had up to 5 surgeries in 2 years, in [place] surgical history, he had a revision of back hardware by Dr Annette Stable - 08/2018 - Follows with [facility] for spine specialist for injections and has seen their pain provider AlsoFollowed by [facility], Pain Management - Ayesha Rumpf FNP, last rx Oxycodone-Acetaminophen 7.5/325mg  #90 on on 09/27/19, and Lyrica 300mg  #60 on 10/11/19 - He has improved with PRN Prednisnoe as well, recently treated 4 months ago in July 2020 with steroid taper but had limited results. I have treated him with Robaxin muscle relaxant PRN with some relief, - he has had [facility] in past as well with benefit, but he would like to resume these services - he is planning to transfer his pain management to [facility] in [place], he already contacted them, needs referral and Marijo File can transition his chart  to [place]  Denies any high risk travel to areas of current concern for COVID19. Denies any known or suspected exposure to person with or possibly with COVID19.  Denies any fevers, chills, sweats, body ache, cough, shortness of breath, sinus pain or pressure, headache, abdominal pain, diarrhea  Past Medical History:  Diagnosis Date  . Anemia   . Anxiety   . Chronic back pain   . Depression   . Hemiplegia affecting left nondominant side ([place])   . Hyperlipidemia   . Mood disorder ([place])   . Personality disorder ([place])   . Restless leg syndrome   . Sleep apnea   . Stroke [facility])    Social History   Tobacco Use  . Smoking status: Current Every Day Smoker    Packs/day: 0.50    Types: Cigarettes  . Smokeless tobacco: Current User  Substance Use Topics  . Alcohol use: No  . Drug use: No    Current Outpatient Medications:  .  amLODipine (NORVASC) 5 MG tablet, TAKE 1 TABLET BY MOUTH EVERY DAY, Disp: 90 tablet, Rfl: 1 .  atorvastatin (LIPITOR) 40 MG tablet, Take 1 tablet (40 mg total) by mouth every evening., Disp: 90 tablet, Rfl: 1 .  baclofen (LIORESAL) 20 MG tablet, Take 20 mg by mouth as needed. , Disp: , Rfl:  .  chlorthalidone (HYGROTON) 25 MG tablet, TAKE 1 TABLET BY MOUTH EVERY DAY, Disp: 90 tablet, Rfl: 1 .  clopidogrel (PLAVIX) 75 MG tablet, Take 1 tablet (75 mg total) by mouth daily., Disp: 90 tablet, Rfl: 1 .  lidocaine (LIDODERM) 5 %, Place 1 patch onto the skin every 12 (twelve)  hours. Remove & Discard patch within 12 hours or as directed by MD, Disp: 10 patch, Rfl: 0 .  losartan (COZAAR) 100 MG tablet, Take 1 tablet (100 mg total) by mouth daily., Disp: 90 tablet, Rfl: 1 .  meclizine (ANTIVERT) 25 MG tablet, Take 1 tablet (25 mg total) by mouth 3 (three) times daily as needed for dizziness., Disp: 90 tablet, Rfl: 1 .  methocarbamol (ROBAXIN) 500 MG tablet, Take 1 tablet (500 mg total) by mouth every 6 (six) hours as needed for muscle spasms., Disp: 120 tablet, Rfl: 2 .   ondansetron (ZOFRAN ODT) 4 MG disintegrating tablet, Take 1 tablet (4 mg total) by mouth every 8 (eight) hours as needed., Disp: 20 tablet, Rfl: 0 .  oxyCODONE-acetaminophen (PERCOCET) 7.5-325 MG tablet, Take 1 tablet by mouth 3 (three) times daily as needed for severe pain., Disp: , Rfl:  .  potassium chloride (K-DUR) 10 MEQ tablet, Take 1 tablet (10 mEq total) by mouth 2 (two) times daily., Disp: 180 tablet, Rfl: 1 .  pregabalin (LYRICA) 300 MG capsule, Take 300 mg by mouth 2 (two) times daily. , Disp: , Rfl:  .  oxyCODONE-acetaminophen (PERCOCET) 7.5-325 MG tablet, oxycodone-acetaminophen 7.5 mg-325 mg tablet  TAKE 1 TABLET BY MOUTH EVERY 8 HOURS AS NEEDED PAIN. DO NOT FILL BEFORE 04/29/2019, Disp: , Rfl:  .  predniSONE (DELTASONE) 20 MG tablet, Take daily with food. Start with 60mg  (3 pills) x 2 days, then reduce to 40mg  (2 pills) x 2 days, then 20mg  (1 pill) x 3 days, Disp: 13 tablet, Rfl: 0  [facility] 10/23/2019 08/20/2019 06/10/2019  Decreased Interest 1 0 1  Down, Depressed, Hopeless 3 0 1  PHQ - 2 Score 4 0 2  Altered sleeping 1 - 1  Tired, decreased energy 1 - 1  Change in appetite 1 - 1  Feeling bad or failure about yourself  1 - 1  Trouble concentrating 1 - 1  Moving slowly or fidgety/restless 1 - 1  Suicidal thoughts 1 - 0  PHQ-9 Score 11 - 8  Difficult doing work/chores Somewhat difficult - Somewhat difficult   [place]-Suicide Severity Rating Scale 1) Have you wished you were dead or wished you could go to sleep and not wake up? - Yes  2) Have you had any actual thoughts of killing yourself? - No  Skip questions 3,4, 5  6) Have you ever done anything, started to do anything, or prepared to do anything to end your life? - No    GAD 7 : Generalized Anxiety Score 10/23/2019  Nervous, Anxious, on Edge 1  Control/stop worrying 1  Worry too much - different things 1  Trouble relaxing 1  Restless 1  Easily annoyed or irritable 1  Afraid - awful might  happen 1  Total GAD 7 Score 7  Anxiety Difficulty Somewhat difficult    -------------------------------------------------------------------------- O: No physical exam performed due to remote telephone encounter.  Lab results reviewed.  No results found for this or any previous visit (from the past 2160 hour(s)).  -------------------------------------------------------------------------- A&P:  Problem List Items Addressed This Visit    Spastic hemiplegia of left dominant side as late effect of cerebral infarction [facility](HCC) - Primary   Relevant Orders   Ambulatory referral to Pain Clinic   Ambulatory referral to Home Health   Painful orthopaedic hardware [facility](HCC)   Relevant Orders   Ambulatory referral to Pain Clinic   Ambulatory referral to Home Health   Major depression, recurrent, chronic (HCC)  LOW BACK PAIN SYNDROME, SEVERE   Relevant Medications   predniSONE (DELTASONE) 20 MG tablet   Other Relevant Orders   Ambulatory referral to Pain Clinic   Ambulatory referral to Home Health   Chronic pain syndrome   Relevant Medications   predniSONE (DELTASONE) 20 MG tablet   Other Relevant Orders   Ambulatory referral to Pain Clinic   Ambulatory referral to Home Health     Clinically with constellation of chronic medical problems related to chronic pain and weakness, underlying Chronic Residual Left sided upper extremity hemiplegia secondary to CVA/Stroke, and underlying Osteoarthritis degenerative disease multiple joints and Lumbar Spine. - He is debilitated by chronic pain and limited mobility - Followed by [facility] and [facility] for pain management, and has been treated by [facility] in past.  Agree to order burst of prednisone 7 day taper, last dose was 06/2019, since it has been 4 months okay to repeat, advised caution on repeat doses, he has requested frequent prednisone in past.  Referral to [facility] and add CSW - may benefit from future attempted placement as  well  Referral to [facility] - can transfer records from [facility], advised patient caution that if there is any concern with pain management in the interval that we would not be rx his chronic pain medication from our office. He understands.  Orders Placed This Encounter  Procedures  . Ambulatory referral to Pain Clinic    Referral Priority:   Routine    Referral Type:   Consultation    Referral Reason:   Specialty Services Required    Requested Specialty:   Pain Medicine    Number of Visits Requested:   1  . Ambulatory referral to Home Health    Referral Priority:   Routine    Referral Type:   Home Health Care    Referral Reason:   Specialty Services Required    Requested Specialty:   Home Health Services    Number of Visits Requested:   1     Meds ordered this encounter  Medications  . predniSONE (DELTASONE) 20 MG tablet    Sig: Take daily with food. Start with 60mg  (3 pills) x 2 days, then reduce to 40mg  (2 pills) x 2 days, then 20mg  (1 pill) x 3 days    Dispense:  13 tablet    Refill:  0    Follow-up: - Return as needed within 3 months  Patient verbalizes understanding with the above medical recommendations including the limitation of remote medical advice.  Specific follow-up and call-back criteria were given for patient to follow-up or seek medical care more urgently if needed.   - Time spent in direct consultation with patient on phone: 12 minutes   , DO [facility] 10/23/2019, 9:56 AM

## 2019-10-24 ENCOUNTER — Telehealth: Payer: Self-pay | Admitting: Family Medicine

## 2019-10-24 NOTE — Telephone Encounter (Signed)
Patient informed referral was placed yesterday by Dr.K during his visit and referral coordinator will schedule his appointment and let him know about status.

## 2019-10-24 NOTE — Telephone Encounter (Signed)
I called the patient to schedule his AWV with Tiffany.  He asked if his referral for pain management had been sent yet.  I told him that I would send a message and ask someone to call him about it.

## 2019-10-29 ENCOUNTER — Ambulatory Visit: Payer: Medicare Other

## 2019-11-01 ENCOUNTER — Telehealth: Payer: Self-pay

## 2019-11-01 NOTE — Telephone Encounter (Signed)
Wilburn Cornelia called checking on request for Uni walker rx and she also is requesting a referral to CAPP program for him to get more asst,  Her call back is [phone]

## 2019-11-01 NOTE — Telephone Encounter (Signed)
As per [place] she will find out with patient about which medical equipment place patient prefers and will  Call us back next week and she will also try to find out who can help him with CAPP program.

## 2019-11-25 ENCOUNTER — Other Ambulatory Visit: Payer: Self-pay | Admitting: Family Medicine

## 2019-11-25 DIAGNOSIS — I10 Essential (primary) hypertension: Secondary | ICD-10-CM

## 2019-12-10 ENCOUNTER — Other Ambulatory Visit: Payer: Self-pay | Admitting: Family Medicine

## 2019-12-10 DIAGNOSIS — I10 Essential (primary) hypertension: Secondary | ICD-10-CM

## 2019-12-11 ENCOUNTER — Other Ambulatory Visit: Payer: Self-pay

## 2019-12-11 ENCOUNTER — Encounter: Payer: Self-pay | Admitting: Emergency Medicine

## 2019-12-11 ENCOUNTER — Emergency Department: Payer: Medicare Other

## 2019-12-11 ENCOUNTER — Emergency Department
Admission: EM | Admit: 2019-12-11 | Discharge: 2019-12-11 | Disposition: A | Discharge status: Home / Self Care | Payer: Medicare Other | Attending: Emergency Medicine | Admitting: Emergency Medicine

## 2019-12-11 DIAGNOSIS — F17210 Nicotine dependence, cigarettes, uncomplicated: Secondary | ICD-10-CM | POA: Diagnosis not present

## 2019-12-11 DIAGNOSIS — Y929 Unspecified place or not applicable: Secondary | ICD-10-CM | POA: Insufficient documentation

## 2019-12-11 DIAGNOSIS — S7002XA Contusion of left hip, initial encounter: Secondary | ICD-10-CM | POA: Diagnosis not present

## 2019-12-11 DIAGNOSIS — G894 Chronic pain syndrome: Secondary | ICD-10-CM | POA: Insufficient documentation

## 2019-12-11 DIAGNOSIS — Y998 Other external cause status: Secondary | ICD-10-CM | POA: Diagnosis not present

## 2019-12-11 DIAGNOSIS — Z8673 Personal history of transient ischemic attack (TIA), and cerebral infarction without residual deficits: Secondary | ICD-10-CM | POA: Insufficient documentation

## 2019-12-11 DIAGNOSIS — Z79899 Other long term (current) drug therapy: Secondary | ICD-10-CM | POA: Diagnosis not present

## 2019-12-11 DIAGNOSIS — W19XXXA Unspecified fall, initial encounter: Secondary | ICD-10-CM | POA: Insufficient documentation

## 2019-12-11 DIAGNOSIS — Y9301 Activity, walking, marching and hiking: Secondary | ICD-10-CM | POA: Diagnosis not present

## 2019-12-11 DIAGNOSIS — F17228 Nicotine dependence, chewing tobacco, with other nicotine-induced disorders: Secondary | ICD-10-CM | POA: Diagnosis not present

## 2019-12-11 DIAGNOSIS — S79912A Unspecified injury of left hip, initial encounter: Secondary | ICD-10-CM | POA: Diagnosis present

## 2019-12-11 LAB — BASIC METABOLIC PANEL
Anion gap: 13 (ref 5–15)
BUN: 11 mg/dL (ref 6–20)
CO2: 23 mmol/L (ref 22–32)
Calcium: 8.9 mg/dL (ref 8.9–10.3)
Chloride: 100 mmol/L (ref 98–111)
Creatinine, Ser: 0.74 mg/dL (ref 0.61–1.24)
GFR calc Af Amer: >60 mL/min (ref >60)
GFR calc non Af Amer: >60 mL/min (ref >60)
Glucose, Bld: 112 mg/dL — ABNORMAL HIGH (ref 70–99)
Potassium: 3.5 mmol/L (ref 3.5–5.1)
Sodium: 136 mmol/L (ref 135–145)

## 2019-12-11 LAB — MAGNESIUM: Magnesium: 1.9 mg/dL (ref 1.7–2.4)

## 2019-12-11 MED ORDER — KETOROLAC TROMETHAMINE 30 MG/ML IJ SOLN
15.0000 mg | Freq: Once | INTRAMUSCULAR | Status: AC
  Administered 2019-12-11: 15 mg via INTRAVENOUS
  Filled 2019-12-11: qty 1

## 2019-12-11 MED ORDER — OXYCODONE-ACETAMINOPHEN 5-325 MG PO TABS
2.0000 | ORAL_TABLET | Freq: Once | ORAL | Status: AC
  Administered 2019-12-11: 2 via ORAL
  Filled 2019-12-11: qty 2

## 2019-12-11 NOTE — ED Provider Notes (Signed)
[facility] Emergency Department Provider Note  ____________________________________________   First MD Initiated Contact with Patient 12/11/19 0143     (approximate)  I have reviewed the triage vital signs and the nursing notes.   HISTORY  Chief Complaint Fall    HPI Mark BameRonnie Roseanne RenoStewart is a 48 y.o. male with a history of a CVA resulting in decreased function of his left arm and leg as well as chronic back pain and psychiatric history as listed below.  He presents by EMS for evaluation after a fall earlier today.  He says that his left arm and leg cramped up on him while he was ambulating with a walker and it caused him to fall.  He says that he struck his left cheek as well as his left hip and he is having pain all throughout his body but mostly in the left hip.  He does not believe he lost consciousness and has no neck pain.  He denies chest pain or shortness of breath.  No recent COVID-19 contacts although the medical record indicates that he tested positive for COVID-19 in July 2020 (about 5 months ago.  He denies fever/chills.  He has not had any issues with urinary incontinence or retention.  He has been going to the bathroom normally.   The onset of the mechanical fall was acute and he reports the pain is severe.        Past Medical History:  Diagnosis Date  . Anemia   . Anxiety   . Chronic back pain   . COVID-19 06/2019   July 2020  . Depression   . Hemiplegia affecting left nondominant side (HCC)   . Hyperlipidemia   . Mood disorder (HCC)   . Personality disorder (HCC)   . Restless leg syndrome   . Sleep apnea   . Stroke [facility](HCC)     Patient Active Problem List   Diagnosis Date Noted  . OSA on CPAP 12/09/2018  . Chronic pain syndrome 12/09/2018  . Major depression, recurrent, chronic (HCC) 12/09/2018  . Insomnia 12/09/2018  . Spastic hemiplegia of left dominant side as late effect of cerebral infarction (HCC) 12/07/2018  . Painful  orthopaedic hardware (HCC) 09/03/2018  . BACK PAIN 01/07/2011  . ADVERSE DRUG REACTION 01/01/2011  . Essential hypertension 12/28/2010  . LOW BACK PAIN SYNDROME, SEVERE 12/28/2010  . OTHER URINARY INCONTINENCE 12/28/2010    Past Surgical History:  Procedure Laterality Date  . BACK SURGERY      Prior to Admission medications   Medication Sig Start Date End Date Taking? Authorizing Provider  amLODipine (NORVASC) 5 MG tablet TAKE 1 TABLET BY MOUTH EVERY DAY 10/17/19   Karamalegos, Netta NeatAlexander J, DO  atorvastatin (LIPITOR) 40 MG tablet Take 1 tablet (40 mg total) by mouth every evening. 08/12/19   Karamalegos, Netta NeatAlexander J, DO  baclofen (LIORESAL) 20 MG tablet Take 20 mg by mouth as needed.  03/21/19   [provider]  chlorthalidone (HYGROTON) 25 MG tablet TAKE 1 TABLET BY MOUTH EVERY DAY 10/17/19   Althea CharonKaramalegos, Netta NeatAlexander J, DO  clopidogrel (PLAVIX) 75 MG tablet Take 1 tablet (75 mg total) by mouth daily. 08/12/19   Karamalegos, Netta NeatAlexander J, DO  lidocaine (LIDODERM) 5 % Place 1 patch onto the skin every 12 (twelve) hours. Remove & Discard patch within 12 hours or as directed by MD 10/18/19 10/17/20  Chesley NoonJessup, Charles, MD  losartan (COZAAR) 100 MG tablet TAKE 1 TABLET BY MOUTH EVERY DAY 11/25/19   Althea CharonKaramalegos, Netta NeatAlexander J,  DO  meclizine (ANTIVERT) 25 MG tablet Take 1 tablet (25 mg total) by mouth 3 (three) times daily as needed for dizziness. 02/26/19   Karamalegos, Netta Neat, DO  methocarbamol (ROBAXIN) 500 MG tablet Take 1 tablet (500 mg total) by mouth every 6 (six) hours as needed for muscle spasms. 02/26/19   Karamalegos, Netta Neat, DO  ondansetron (ZOFRAN ODT) 4 MG disintegrating tablet Take 1 tablet (4 mg total) by mouth every 8 (eight) hours as needed. 07/18/19   Nita Sickle, MD  oxyCODONE-acetaminophen (PERCOCET) 7.5-325 MG tablet Take 1 tablet by mouth 3 (three) times daily as needed for severe pain.    [provider]  oxyCODONE-acetaminophen (PERCOCET) 7.5-325 MG  tablet oxycodone-acetaminophen 7.5 mg-325 mg tablet  TAKE 1 TABLET BY MOUTH EVERY 8 HOURS AS NEEDED PAIN. DO NOT FILL BEFORE 04/29/2019    [provider]  potassium chloride (KLOR-CON) 10 MEQ tablet TAKE 1 TABLET (10 MEQ TOTAL) BY MOUTH 2 (TWO) TIMES DAILY. 12/11/19   Karamalegos, Netta Neat, DO  predniSONE (DELTASONE) 20 MG tablet Take daily with food. Start with 60mg  (3 pills) x 2 days, then reduce to 40mg  (2 pills) x 2 days, then 20mg  (1 pill) x 3 days 10/23/19   , DO  pregabalin (LYRICA) 300 MG capsule Take 300 mg by mouth 2 (two) times daily.     [provider]    Allergies Aspirin, Ibuprofen, and Shellfish allergy  History reviewed. No pertinent family history.  Social History Social History   Tobacco Use  . Smoking status: Current Every Day Smoker    Packs/day: 0.50    Types: Cigarettes  . Smokeless tobacco: Current User  Substance Use Topics  . Alcohol use: No  . Drug use: No    Review of Systems Constitutional: No fever/chills Eyes: No visual changes. ENT: No sore throat. Cardiovascular: Denies chest pain. Respiratory: Denies shortness of breath. Gastrointestinal: No abdominal pain.  No nausea, no vomiting.  No diarrhea.  No constipation. Genitourinary: Negative for dysuria. Musculoskeletal: Chronic pain syndrome but acute pain in left hip but also states that his whole body is sore after his fall. Integumentary: Negative for rash. Neurological: Negative for headaches, focal weakness or numbness.   ____________________________________________   PHYSICAL EXAM:  VITAL SIGNS: ED Triage Vitals  Enc Vitals Group     BP 12/11/19 0136 124/90     Pulse Rate 12/11/19 0136 84     Resp 12/11/19 0136 20     Temp 12/11/19 0136 98.6 F (37 C)     Temp Source 12/11/19 0136 Oral     SpO2 12/11/19 0136 98 %     Weight 12/11/19 0139 77.1 kg (170 lb)     Height 12/11/19 0139 1.676 m (5\' 6" )     Head Circumference --      Peak  Flow --      Pain Score 12/11/19 0139 10     Pain Loc --      Pain Edu? --      Excl. in GC? --     Constitutional: Alert and oriented.  Appears uncomfortable but is not in severe distress. Eyes: Conjunctivae are normal.  Head: Atraumatic.  He reports some tenderness to palpation of the left cheek but there is no obvious bruising or ecchymosis. Nose: No congestion/rhinnorhea. Mouth/Throat: Patient is wearing a mask. Neck: No stridor.  No meningeal signs.   Cardiovascular: Normal rate, regular rhythm. Good peripheral circulation. Grossly normal heart sounds. Respiratory: Normal respiratory effort.  No retractions. Gastrointestinal: Soft and nontender. No distention.  Musculoskeletal: No tenderness to palpation of the cervical spine.  Normal rotation of the head and neck in flexion extension without pain.  Patient is reporting pain primarily in his left hip but also in his left shoulder.  However he has normal passive range of motion of the left shoulder although it causes little bit of pain to do so but the range is not limited.  He also has normal passive and nontender range of motion of the left hip and knee. Neurologic:  Normal speech and language.  Patient has some decreased strength in the left arm and the left leg which she states is chronic after his CVA. Skin:  Skin is warm, dry and intact. Psychiatric: Mood and affect are normal. Speech and behavior are normal.  ____________________________________________   LABS (all labs ordered are listed, but only abnormal results are displayed)  Labs Reviewed  BASIC METABOLIC PANEL - Abnormal; Notable for the following components:      Result Value   Glucose, Bld 112 (*)    All other components within normal limits  MAGNESIUM   ____________________________________________  EKG  ED ECG REPORT I, Loleta Rose, the attending physician, personally viewed and interpreted this ECG.  Date: 12/11/2019 EKG Time: 1:38 AM Rate: 86 Rhythm:  normal sinus rhythm QRS Axis: normal Intervals: LVH ST/T Wave abnormalities: Non-specific ST segment / T-wave changes, but no clear evidence of acute ischemia. Narrative Interpretation: no definitive evidence of acute ischemia; does not meet STEMI criteria.   ____________________________________________  RADIOLOGY I, Loleta Rose, personally viewed and evaluated these images (plain radiographs) as part of my medical decision making, as well as reviewing the written report by the radiologist.  ED MD interpretation:  No acute fracture/dislocation  Official radiology report(s): DG Hip Unilat With Pelvis 2-3 Views Left  Result Date: 12/11/2019 CLINICAL DATA:  Initial evaluation for acute trauma, fall. EXAM: DG HIP (WITH OR WITHOUT PELVIS) 2-3V LEFT COMPARISON:  None. FINDINGS: No acute fracture or dislocation. Femoral head in normal alignment within the acetabulum. Femoral head height maintained. Pelvis intact. Limited views of the right hip grossly unremarkable. Postsurgical changes noted within lower lumbar spine. No visible soft tissue injury. IMPRESSION: No acute osseous abnormality about the left hip. Electronically Signed   By: Rise Mu M.D.   On: 12/11/2019 02:35    ____________________________________________   PROCEDURES   Procedure(s) performed (including Critical Care):  Procedures   ____________________________________________   INITIAL IMPRESSION / MDM / ASSESSMENT AND PLAN / ED COURSE  As part of my medical decision making, I reviewed the following data within the electronic MEDICAL RECORD NUMBER Nursing notes reviewed and incorporated, Labs reviewed , Old chart reviewed, Notes from prior ED visits and [place] Controlled Substance Database   Differential diagnosis includes, but is not limited to, mechanical fall, acute on chronic pain, fracture/dislocation, contusion, electrolyte abnormality leading to cramping.  The patient mentions the cramping of his muscles  several times so I will check to see if he has any acute electrolyte abnormalities, but he states he has been having this cramping pain for an extended period of time.  He has a primary care doctor as well as doctors in [place] ([place]).  I checked the [place] controlled substance database and he has regular prescriptions for narcotics including one that was prescribed about 3 weeks ago for a month's supply.  I have a low suspicion for fracture or dislocation based on the patient's physical exam I  will obtain images of his left hip since that is the point of maximal tenderness.  His left arm exam was reassuring and he is able to use it and has normal passive range of motion.  He has asked several times about his acute pain and I will treat him with two Percocet which he is prescribed by one of his regular doctors as well as a dose of Toradol 15 mg IV.  Although he has a history of gastric ulcers, a reduced dose of IV Toradol 15 mg IV should not cause any issues and should be helpful for him in terms of pain and inflammation.  The patient understands and agrees with the plan and if his labs and x-rays are normal he will return home for outpatient follow-up.      Clinical Course as of Dec 10 333  Wed Dec 11, 2019  0251 no fractures/dislocations  DG Hip Unilat With Pelvis 2-3 Views Left [CF]  0310 Normal basic metabolic panel and magnesium, no concern for electrolyte abnormality leading to pain and spasms.  I will discharge the patient for close outpatient follow-up as previously described.  I gave my usual and customary return precautions.   [CF]    Clinical Course User Index [CF] Hinda Kehr, MD     ____________________________________________  FINAL CLINICAL IMPRESSION(S) / ED DIAGNOSES  Final diagnoses:  Fall, initial encounter  Contusion of left hip, initial encounter  Chronic pain syndrome     MEDICATIONS GIVEN DURING THIS VISIT:  Medications  oxyCODONE-acetaminophen  (PERCOCET/ROXICET) 5-325 MG per tablet 2 tablet (2 tablets Oral Given 12/11/19 0235)  ketorolac (TORADOL) 30 MG/ML injection 15 mg (15 mg Intravenous Given 12/11/19 0235)     ED Discharge Orders    None      *Please note:  Benjamin Casanas was evaluated in Emergency Department on 12/11/2019 for the symptoms described in the history of present illness. He was evaluated in the context of the global COVID-19 pandemic, which necessitated consideration that the patient might be at risk for infection with the SARS-CoV-2 virus that causes COVID-19. Institutional protocols and algorithms that pertain to the evaluation of patients at risk for COVID-19 are in a state of rapid change based on information released by regulatory bodies including the CDC and federal and state organizations. These policies and algorithms were followed during the patient's care in the ED.  Some ED evaluations and interventions may be delayed as a result of limited staffing during the pandemic.*  Note:  This document was prepared using Dragon voice recognition software and may include unintentional dictation errors.   Hinda Kehr, MD 12/11/19 [phone]

## 2019-12-11 NOTE — Discharge Instructions (Signed)
You have been seen in the Emergency Department (ED) today for a fall.  Your work up does not show any concerning injuries.  Please take your regular prescribed medication as instructed.  Please follow up with your doctor regarding today's Emergency Department (ED) visit and your recent fall.    Return to the ED if you have any headache, confusion, slurred speech, weakness/numbness of any arm or leg, or any increased pain.

## 2019-12-11 NOTE — ED Notes (Signed)
Pt asked this RN to wheel him outside to smoke. Pt placed outside in wheelchair, first nurse and security made aware that pt is outside and in front of the window where they can see him.

## 2019-12-11 NOTE — ED Triage Notes (Signed)
Pt arrived via [facility] from home with a fall earlier today and weakness. Pt complaining of left arm, left hip, and leg pain. Pt was able to walk to the porch to meet ems at home. Pt has hx of stroke affecting the left side. Vitals WNL.

## 2019-12-11 NOTE — ED Notes (Signed)
Blood redrawn and sent to lab

## 2019-12-13 ENCOUNTER — Encounter: Payer: Self-pay | Admitting: Emergency Medicine

## 2019-12-13 ENCOUNTER — Emergency Department
Admission: EM | Admit: 2019-12-13 | Discharge: 2019-12-13 | Disposition: A | Discharge status: Home / Self Care | Payer: Medicare Other | Attending: Emergency Medicine | Admitting: Emergency Medicine

## 2019-12-13 ENCOUNTER — Other Ambulatory Visit: Payer: Self-pay

## 2019-12-13 ENCOUNTER — Emergency Department: Payer: Medicare Other

## 2019-12-13 DIAGNOSIS — F17210 Nicotine dependence, cigarettes, uncomplicated: Secondary | ICD-10-CM | POA: Diagnosis not present

## 2019-12-13 DIAGNOSIS — M545 Low back pain, unspecified: Secondary | ICD-10-CM

## 2019-12-13 DIAGNOSIS — W19XXXA Unspecified fall, initial encounter: Secondary | ICD-10-CM

## 2019-12-13 DIAGNOSIS — Z20828 Contact with and (suspected) exposure to other viral communicable diseases: Secondary | ICD-10-CM | POA: Diagnosis not present

## 2019-12-13 DIAGNOSIS — Z8673 Personal history of transient ischemic attack (TIA), and cerebral infarction without residual deficits: Secondary | ICD-10-CM | POA: Insufficient documentation

## 2019-12-13 DIAGNOSIS — F17220 Nicotine dependence, chewing tobacco, uncomplicated: Secondary | ICD-10-CM | POA: Insufficient documentation

## 2019-12-13 DIAGNOSIS — G8929 Other chronic pain: Secondary | ICD-10-CM

## 2019-12-13 LAB — COMPREHENSIVE METABOLIC PANEL
ALT: 17 U/L (ref 0–44)
AST: 17 U/L (ref 15–41)
Albumin: 3.9 g/dL (ref 3.5–5.0)
Alkaline Phosphatase: 74 U/L (ref 38–126)
Anion gap: 7 (ref 5–15)
BUN: 8 mg/dL (ref 6–20)
CO2: 29 mmol/L (ref 22–32)
Calcium: 9.2 mg/dL (ref 8.9–10.3)
Chloride: 103 mmol/L (ref 98–111)
Creatinine, Ser: 0.74 mg/dL (ref 0.61–1.24)
GFR calc Af Amer: >60 mL/min (ref >60)
GFR calc non Af Amer: >60 mL/min (ref >60)
Glucose, Bld: 93 mg/dL (ref 70–99)
Potassium: 3.8 mmol/L (ref 3.5–5.1)
Sodium: 139 mmol/L (ref 135–145)
Total Bilirubin: 0.5 mg/dL (ref 0.3–1.2)
Total Protein: 7.5 g/dL (ref 6.5–8.1)

## 2019-12-13 LAB — CBC
HCT: 41.5 % (ref 39.0–52.0)
Hemoglobin: 13.8 g/dL (ref 13.0–17.0)
MCH: 30.8 pg (ref 26.0–34.0)
MCHC: 33.3 g/dL (ref 30.0–36.0)
MCV: 92.6 fL (ref 80.0–100.0)
Platelets: 266 10*3/uL (ref 150–400)
RBC: 4.48 MIL/uL (ref 4.22–5.81)
RDW: 12.2 % (ref 11.5–15.5)
WBC: 8.1 10*3/uL (ref 4.0–10.5)
nRBC: 0 % (ref 0.0–0.2)

## 2019-12-13 MED ORDER — HYDROMORPHONE HCL 1 MG/ML IJ SOLN
1.0000 mg | Freq: Once | INTRAMUSCULAR | Status: AC
  Administered 2019-12-13: 1 mg via INTRAVENOUS
  Filled 2019-12-13: qty 1

## 2019-12-13 MED ORDER — HYDROMORPHONE HCL 1 MG/ML IJ SOLN
1.0000 mg | Freq: Once | INTRAMUSCULAR | Status: AC
  Administered 2019-12-13: 1 mg via INTRAMUSCULAR
  Filled 2019-12-13: qty 1

## 2019-12-13 NOTE — ED Notes (Addendum)
Reviewed discharge instructions, follow-up care, and prescriptions with patient. Patient verbalized understanding of all information reviewed. Patient stable, with no distress noted at this time.    

## 2019-12-13 NOTE — ED Notes (Signed)
Pt reports lower black pain.

## 2019-12-13 NOTE — ED Provider Notes (Signed)
[facility] Emergency Department Provider Note       Time seen: ----------------------------------------- 6:37 PM on 12/13/2019 -----------------------------------------   I have reviewed the triage vital signs and the nursing notes.  HISTORY   Chief Complaint Fall    HPI Avel Hocker is a 48 y.o. male with a history of anemia, anxiety, chronic back pain, COVID-19, depression, hemiplegia, hyperlipidemia, personality disorder who presents to the ED for a fall.  Patient had a stroke 2 years ago and has had left-sided weakness since then.  He was walking using a walker and his left leg gave out, causing him to fall on his back.  Patient has chronic back pain but the low back pain is worse than normal.  He denies loss of bowel or bladder function, denies new radicular pain or numbness.  Past Medical History:  Diagnosis Date  . Anemia   . Anxiety   . Chronic back pain   . COVID-19 06/2019   July 2020  . Depression   . Hemiplegia affecting left nondominant side (HCC)   . Hyperlipidemia   . Mood disorder (HCC)   . Personality disorder (HCC)   . Restless leg syndrome   . Sleep apnea   . Stroke [name])     Patient Active Problem List   Diagnosis Date Noted  . OSA on CPAP 12/09/2018  . Chronic pain syndrome 12/09/2018  . Major depression, recurrent, chronic (HCC) 12/09/2018  . Insomnia 12/09/2018  . Spastic hemiplegia of left dominant side as late effect of cerebral infarction (HCC) 12/07/2018  . Painful orthopaedic hardware (HCC) 09/03/2018  . BACK PAIN 01/07/2011  . ADVERSE DRUG REACTION 01/01/2011  . Essential hypertension 12/28/2010  . LOW BACK PAIN SYNDROME, SEVERE 12/28/2010  . OTHER URINARY INCONTINENCE 12/28/2010    Past Surgical History:  Procedure Laterality Date  . BACK SURGERY      Allergies Aspirin, Ibuprofen, and Shellfish allergy  Social History Social History   Tobacco Use  . Smoking status: Current Every Day Smoker   Packs/day: 0.50    Types: Cigarettes  . Smokeless tobacco: Current User  Substance Use Topics  . Alcohol use: No  . Drug use: No   Review of Systems Constitutional: Negative for fever. Cardiovascular: Negative for chest pain. Respiratory: Negative for shortness of breath. Gastrointestinal: Negative for abdominal pain, vomiting and diarrhea. Musculoskeletal: Positive for low back pain Skin: Negative for rash. Neurological: Negative for headaches, focal weakness or numbness.  All systems negative/normal/unremarkable except as stated in the HPI  ____________________________________________   PHYSICAL EXAM:  VITAL SIGNS: ED Triage Vitals  Enc Vitals Group     BP 12/13/19 1824 120/85     Pulse Rate 12/13/19 1824 73     Resp 12/13/19 1824 13     Temp 12/13/19 1824 98.6 F (37 C)     Temp Source 12/13/19 1824 Oral     SpO2 12/13/19 1824 98 %     Weight 12/13/19 1818 170 lb (77.1 kg)     Height 12/13/19 1818 5\' 6"  (1.676 m)     Head Circumference --      Peak Flow --      Pain Score 12/13/19 1818 0     Pain Loc --      Pain Edu? --      Excl. in GC? --    Constitutional: Alert and oriented. Well appearing and in no distress. Cardiovascular: Normal rate, regular rhythm. No murmurs, rubs, or gallops. Respiratory: Normal respiratory effort without  tachypnea nor retractions. Breath sounds are clear and equal bilaterally. No wheezes/rales/rhonchi. Musculoskeletal: Nontender with normal range of motion in extremities. No lower extremity tenderness nor edema.  Midline lumbar spine tenderness is noted Neurologic:  Normal speech and language. No gross focal neurologic deficits are appreciated.  Previous left-sided weakness is noted Skin:  Skin is warm, dry and intact. No rash noted. Psychiatric: Mood and affect are normal. Speech and behavior are normal.  ____________________________________________  ED COURSE:  As part of my medical decision making, I reviewed the following data  within the [place] History obtained from family if available, nursing notes, old chart and ekg, as well as notes from prior ED visits. Patient presented for a fall with resulting low back pain, we will assess with imaging as indicated at this time.   Procedures  Jaxen Samples was evaluated in Emergency Department on 12/13/2019 for the symptoms described in the history of present illness. He was evaluated in the context of the global COVID-19 pandemic, which necessitated consideration that the patient might be at risk for infection with the SARS-CoV-2 virus that causes COVID-19. Institutional protocols and algorithms that pertain to the evaluation of patients at risk for COVID-19 are in a state of rapid change based on information released by regulatory bodies including the CDC and federal and state organizations. These policies and algorithms were followed during the patient's care in the ED.  ____________________________________________   LABS (pertinent positives/negatives)  Labs Reviewed  SARS CORONAVIRUS 2 (TAT 6-24 HRS)  COMPREHENSIVE METABOLIC PANEL  CBC    RADIOLOGY Images were viewed by me  Lumbar spine x-rays IMPRESSION:  No acute bony abnormality.  ____________________________________________   DIFFERENTIAL DIAGNOSIS   Fall, contusion, fracture, sciatica, chronic pain  FINAL ASSESSMENT AND PLAN  Fall, low back pain   Plan: The patient had presented for fall with low back injury. Patient's imaging did not reveal any acute process.  He appears to have acute on chronic low back pain but otherwise is cleared for outpatient follow-up.   Laurence Aly, MD    Note: This note was generated in part or whole with voice recognition software. Voice recognition is usually quite accurate but there are transcription errors that can and very often do occur. I apologize for any typographical errors that were not detected and corrected.     Earleen Newport, MD 12/13/19 2024

## 2019-12-13 NOTE — ED Triage Notes (Signed)
Pt arrival via [facility] from home with fall. Pt had stroke two years ago and has had left sided weakness since, pt was walking using walker and his left leg gave out and he fell on his back. Pt has hx of chronic back issues.  Dr. Jimmye Norman at bedside.

## 2019-12-14 LAB — SARS CORONAVIRUS 2 (TAT 6-24 HRS): SARS Coronavirus 2: NEGATIVE

## 2019-12-20 ENCOUNTER — Emergency Department: Payer: Medicare Other

## 2019-12-20 ENCOUNTER — Other Ambulatory Visit: Payer: Self-pay

## 2019-12-20 ENCOUNTER — Emergency Department
Admission: EM | Admit: 2019-12-20 | Discharge: 2019-12-21 | Disposition: A | Discharge status: Home / Self Care | Payer: Medicare Other | Attending: Emergency Medicine | Admitting: Emergency Medicine

## 2019-12-20 DIAGNOSIS — R109 Unspecified abdominal pain: Secondary | ICD-10-CM | POA: Diagnosis not present

## 2019-12-20 DIAGNOSIS — I10 Essential (primary) hypertension: Secondary | ICD-10-CM | POA: Diagnosis not present

## 2019-12-20 DIAGNOSIS — Z8673 Personal history of transient ischemic attack (TIA), and cerebral infarction without residual deficits: Secondary | ICD-10-CM | POA: Insufficient documentation

## 2019-12-20 DIAGNOSIS — Z7902 Long term (current) use of antithrombotics/antiplatelets: Secondary | ICD-10-CM | POA: Insufficient documentation

## 2019-12-20 DIAGNOSIS — Z79899 Other long term (current) drug therapy: Secondary | ICD-10-CM | POA: Diagnosis not present

## 2019-12-20 DIAGNOSIS — N12 Tubulo-interstitial nephritis, not specified as acute or chronic: Secondary | ICD-10-CM | POA: Diagnosis not present

## 2019-12-20 DIAGNOSIS — M545 Low back pain: Secondary | ICD-10-CM | POA: Diagnosis present

## 2019-12-20 DIAGNOSIS — F17210 Nicotine dependence, cigarettes, uncomplicated: Secondary | ICD-10-CM | POA: Insufficient documentation

## 2019-12-20 LAB — CBC WITH DIFFERENTIAL/PLATELET
Abs Immature Granulocytes: 0.06 10*3/uL (ref 0.00–0.07)
Basophils Absolute: 0 10*3/uL (ref 0.0–0.1)
Basophils Relative: 0 %
Eosinophils Absolute: 0 10*3/uL (ref 0.0–0.5)
Eosinophils Relative: 0 %
HCT: 41.9 % (ref 39.0–52.0)
Hemoglobin: 14.1 g/dL (ref 13.0–17.0)
Immature Granulocytes: 0 %
Lymphocytes Relative: 14 %
Lymphs Abs: 2.1 10*3/uL (ref 0.7–4.0)
MCH: 29.7 pg (ref 26.0–34.0)
MCHC: 33.7 g/dL (ref 30.0–36.0)
MCV: 88.4 fL (ref 80.0–100.0)
Monocytes Absolute: 1 10*3/uL (ref 0.1–1.0)
Monocytes Relative: 7 %
Neutro Abs: 11.4 10*3/uL — ABNORMAL HIGH (ref 1.7–7.7)
Neutrophils Relative %: 79 %
Platelets: 323 10*3/uL (ref 150–400)
RBC: 4.74 MIL/uL (ref 4.22–5.81)
RDW: 12.2 % (ref 11.5–15.5)
WBC: 14.7 10*3/uL — ABNORMAL HIGH (ref 4.0–10.5)
nRBC: 0 % (ref 0.0–0.2)

## 2019-12-20 LAB — URINALYSIS, COMPLETE (UACMP) WITH MICROSCOPIC
Bilirubin Urine: NEGATIVE
Glucose, UA: NEGATIVE mg/dL
Ketones, ur: NEGATIVE mg/dL
Nitrite: POSITIVE — AB
Protein, ur: 100 mg/dL — AB
RBC / HPF: >50 RBC/hpf — ABNORMAL HIGH (ref 0–5)
Specific Gravity, Urine: 1.008 (ref 1.005–1.030)
Squamous Epithelial / HPF: NONE SEEN (ref 0–5)
WBC, UA: >50 WBC/hpf — ABNORMAL HIGH (ref 0–5)
pH: 5 (ref 5.0–8.0)

## 2019-12-20 MED ORDER — ONDANSETRON 4 MG PO TBDP
4.0000 mg | ORAL_TABLET | Freq: Once | ORAL | Status: AC
  Administered 2019-12-20: 4 mg via ORAL
  Filled 2019-12-20: qty 1

## 2019-12-20 MED ORDER — OXYCODONE-ACETAMINOPHEN 5-325 MG PO TABS
1.0000 | ORAL_TABLET | Freq: Once | ORAL | Status: AC
  Administered 2019-12-20: 1 via ORAL
  Filled 2019-12-20: qty 1

## 2019-12-20 MED ORDER — SODIUM CHLORIDE 0.9 % IV SOLN
1.0000 g | Freq: Once | INTRAVENOUS | Status: AC
  Administered 2019-12-21: 1 g via INTRAVENOUS
  Filled 2019-12-20: qty 10

## 2019-12-20 NOTE — ED Provider Notes (Signed)
Emergency Department Provider Note  ____________________________________________  Time seen: Approximately 11:02 PM  I have reviewed the triage vital signs and the nursing notes.   HISTORY  Chief Complaint Back Pain   Historian Patient    HPI Mark Wells is a 48 y.o. male presents to the emergency department with low back pain.  Patient reports that he has experienced low back pain for several weeks along with dysuria and increased urinary frequency.  Patient reports that he had a mechanical, nonsyncopal fall in his room earlier in the day and now reports that his low back pain is worsening.  He states that he is chilled.  He denies nausea and vomiting.  He denies prior history of pyelonephritis or nephrolithiasis. No other alleviating measures have been attempted.    Past Medical History:  Diagnosis Date  . Anemia   . Anxiety   . Chronic back pain   . COVID-19 06/2019   July 2020  . Depression   . Hemiplegia affecting left nondominant side (HCC)   . Hyperlipidemia   . Mood disorder (HCC)   . Personality disorder (HCC)   . Restless leg syndrome   . Sleep apnea   . Stroke [facility])      Immunizations up to date:  Yes.     Past Medical History:  Diagnosis Date  . Anemia   . Anxiety   . Chronic back pain   . COVID-19 06/2019   July 2020  . Depression   . Hemiplegia affecting left nondominant side (HCC)   . Hyperlipidemia   . Mood disorder (HCC)   . Personality disorder (HCC)   . Restless leg syndrome   . Sleep apnea   . Stroke [facility])     Patient Active Problem List   Diagnosis Date Noted  . OSA on CPAP 12/09/2018  . Chronic pain syndrome 12/09/2018  . Major depression, recurrent, chronic (HCC) 12/09/2018  . Insomnia 12/09/2018  . Spastic hemiplegia of left dominant side as late effect of cerebral infarction (HCC) 12/07/2018  . Painful orthopaedic hardware (HCC) 09/03/2018  . BACK PAIN 01/07/2011  . ADVERSE DRUG REACTION 01/01/2011  . Essential  hypertension 12/28/2010  . LOW BACK PAIN SYNDROME, SEVERE 12/28/2010  . OTHER URINARY INCONTINENCE 12/28/2010    Past Surgical History:  Procedure Laterality Date  . BACK SURGERY      Prior to Admission medications   Medication Sig Start Date End Date Taking? Authorizing Provider  amLODipine (NORVASC) 5 MG tablet TAKE 1 TABLET BY MOUTH EVERY DAY 10/17/19   Karamalegos, Netta Neat, DO  atorvastatin (LIPITOR) 40 MG tablet Take 1 tablet (40 mg total) by mouth every evening. 08/12/19   Karamalegos, Netta Neat, DO  baclofen (LIORESAL) 20 MG tablet Take 20 mg by mouth as needed.  03/21/19   [provider]  chlorthalidone (HYGROTON) 25 MG tablet TAKE 1 TABLET BY MOUTH EVERY DAY 10/17/19   Althea Charon, Netta Neat, DO  clopidogrel (PLAVIX) 75 MG tablet Take 1 tablet (75 mg total) by mouth daily. 08/12/19   Karamalegos, Netta Neat, DO  levofloxacin (LEVAQUIN) 750 MG tablet Take 1 tablet (750 mg total) by mouth daily for 7 days. 12/21/19 12/28/19  Orvil Feil, PA-C  lidocaine (LIDODERM) 5 % Place 1 patch onto the skin every 12 (twelve) hours. Remove & Discard patch within 12 hours or as directed by MD 10/18/19 10/17/20  Chesley Noon, MD  losartan (COZAAR) 100 MG tablet TAKE 1 TABLET BY MOUTH EVERY DAY 11/25/19   Saralyn Pilar  J, DO  meclizine (ANTIVERT) 25 MG tablet Take 1 tablet (25 mg total) by mouth 3 (three) times daily as needed for dizziness. 02/26/19   Karamalegos, Netta NeatAlexander J, DO  methocarbamol (ROBAXIN) 500 MG tablet Take 1 tablet (500 mg total) by mouth every 6 (six) hours as needed for muscle spasms. 02/26/19   Karamalegos, Netta NeatAlexander J, DO  ondansetron (ZOFRAN ODT) 4 MG disintegrating tablet Take 1 tablet (4 mg total) by mouth every 8 (eight) hours as needed. 07/18/19   Nita SickleVeronese, [place], MD  oxyCODONE-acetaminophen (PERCOCET) 7.5-325 MG tablet Take 1 tablet by mouth 3 (three) times daily as needed for severe pain.    [provider]  oxyCODONE-acetaminophen  (PERCOCET) 7.5-325 MG tablet oxycodone-acetaminophen 7.5 mg-325 mg tablet  TAKE 1 TABLET BY MOUTH EVERY 8 HOURS AS NEEDED PAIN. DO NOT FILL BEFORE 04/29/2019    [provider]  potassium chloride (KLOR-CON) 10 MEQ tablet TAKE 1 TABLET (10 MEQ TOTAL) BY MOUTH 2 (TWO) TIMES DAILY. 12/11/19   Karamalegos, Netta NeatAlexander J, DO  predniSONE (DELTASONE) 20 MG tablet Take daily with food. Start with 60mg  (3 pills) x 2 days, then reduce to 40mg  (2 pills) x 2 days, then 20mg  (1 pill) x 3 days 10/23/19   Smitty CordsKaramalegos, Alexander J, DO  pregabalin (LYRICA) 300 MG capsule Take 300 mg by mouth 2 (two) times daily.     [provider]    Allergies Aspirin, Ibuprofen, and Shellfish allergy  No family history on file.  Social History Social History   Tobacco Use  . Smoking status: Current Every Day Smoker    Packs/day: 0.50    Types: Cigarettes  . Smokeless tobacco: Current User  Substance Use Topics  . Alcohol use: No  . Drug use: No     Review of Systems  Constitutional: Patient has chills.  Eyes:  No discharge ENT: No upper respiratory complaints. Respiratory: no cough. No SOB/ use of accessory muscles to breath Gastrointestinal:   No nausea, no vomiting.  No diarrhea.  No constipation. Musculoskeletal: Patient has low back pain.  Skin: Negative for rash, abrasions, lacerations, ecchymosis.   ____________________________________________   PHYSICAL EXAM:  VITAL SIGNS: ED Triage Vitals  Enc Vitals Group     BP 12/20/19 2147 133/70     Pulse Rate 12/20/19 2147 89     Resp 12/20/19 2147 18     Temp 12/20/19 2147 99.8 F (37.7 C)     Temp Source 12/20/19 2147 Oral     SpO2 12/20/19 2147 99 %     Weight 12/20/19 2145 170 lb (77.1 kg)     Height 12/20/19 2145 5\' 6"  (1.676 m)     Head Circumference --      Peak Flow --      Pain Score 12/20/19 2145 10     Pain Loc --      Pain Edu? --      Excl. in GC? --      Constitutional: Alert and oriented. Well appearing and  in no acute distress. Eyes: Conjunctivae are normal. PERRL. EOMI. Head: Atraumatic. ENT:      Nose: No congestion/rhinnorhea.      Mouth/Throat: Mucous membranes are moist.  Neck: No stridor.  No cervical spine tenderness to palpation. Cardiovascular: Normal rate, regular rhythm. Normal S1 and S2.  Good peripheral circulation. Respiratory: Normal respiratory effort without tachypnea or retractions. Lungs CTAB. Good air entry to the bases with no decreased or absent breath sounds Gastrointestinal: Bowel sounds x 4 quadrants.  Soft and nontender to palpation. No guarding or rigidity. No distention. Musculoskeletal: Full range of motion to all extremities. No obvious deformities noted Neurologic:  Normal for age. No gross focal neurologic deficits are appreciated.  Skin:  Skin is warm, dry and intact. No rash noted. Psychiatric: Mood and affect are normal for age. Speech and behavior are normal.   ____________________________________________   LABS (all labs ordered are listed, but only abnormal results are displayed)  Labs Reviewed  URINALYSIS, COMPLETE (UACMP) WITH MICROSCOPIC - Abnormal; Notable for the following components:      Result Value   Color, Urine YELLOW (*)    APPearance CLOUDY (*)    Hgb urine dipstick LARGE (*)    Protein, ur 100 (*)    Nitrite POSITIVE (*)    Leukocytes,Ua LARGE (*)    RBC / HPF >50 (*)    WBC, UA >50 (*)    Bacteria, UA MANY (*)    All other components within normal limits  CBC WITH DIFFERENTIAL/PLATELET - Abnormal; Notable for the following components:   WBC 14.7 (*)    Neutro Abs 11.4 (*)    All other components within normal limits  COMPREHENSIVE METABOLIC PANEL - Abnormal; Notable for the following components:   Glucose, Bld 103 (*)    AST 14 (*)    All other components within normal limits  URINE CULTURE  LACTIC ACID, PLASMA  LACTIC ACID, PLASMA    ____________________________________________  EKG   ____________________________________________  RADIOLOGY Unk Pinto, personally viewed and evaluated these images (plain radiographs) as part of my medical decision making, as well as reviewing the written report by the radiologist.    No results found.  ____________________________________________    PROCEDURES  Procedure(s) performed:     Procedures     Medications  oxyCODONE-acetaminophen (PERCOCET/ROXICET) 5-325 MG per tablet 1 tablet (has no administration in time range)  oxyCODONE-acetaminophen (PERCOCET/ROXICET) 5-325 MG per tablet 1 tablet (1 tablet Oral Given 12/20/19 2333)  ondansetron (ZOFRAN-ODT) disintegrating tablet 4 mg (4 mg Oral Given 12/20/19 2332)  cefTRIAXone (ROCEPHIN) 1 g in sodium chloride 0.9 % 100 mL IVPB (1 g Intravenous New Bag/Given 12/21/19 0009)  sodium chloride 0.9 % bolus 500 mL (500 mLs Intravenous New Bag/Given 12/21/19 0014)     ____________________________________________   INITIAL IMPRESSION / ASSESSMENT AND PLAN / ED COURSE  Pertinent labs & imaging results that were available during my care of the patient were reviewed by me and considered in my medical decision making (see chart for details).      Assessment and Plan: Pyelonephritis 48 year old male presents to the emergency department with bilateral low back pain for the past 1 to 2 weeks which seems worse than usual.  Patient is also had chills.  Patient had low-grade fever at triage but vital signs were otherwise reassuring.  He had leukocytosis on CBC compared with labs a week ago.  Lactic acid was within reference range.  CMP was reassuring.  CT renal stone study revealed findings consistent with pyelonephritis but not nephrolithiasis.  Urinalysis was concerning for cystitis with a large amount of blood and leukocytes.  Urinalysis was nitrate positive.  Patient was given IV Rocephin in the emergency  department and he was discharged with Levaquin.  Patient was given strict return precautions to return to the emergency department with new or worsening symptoms.  He voiced understanding and has easy access to the ED should symptoms change.   ____________________________________________  FINAL CLINICAL IMPRESSION(S) / ED DIAGNOSES  Final diagnoses:  Pyelonephritis      NEW MEDICATIONS STARTED DURING THIS VISIT:  ED Discharge Orders         Ordered    levofloxacin (LEVAQUIN) 750 MG tablet  Daily     12/21/19 0037              This chart was dictated using voice recognition software/Dragon. Despite best efforts to proofread, errors can occur which can change the meaning. Any change was purely unintentional.     Orvil Feil, PA-C 12/21/19 8119    Dionne Bucy, MD 12/29/19 [phone]

## 2019-12-20 NOTE — ED Triage Notes (Signed)
Pt arrives to ED via [facility] from home with c/o lower back pain since 9am this morning. Pt reports falling in his room at home and landing on his buttocks. No head injury, neck pain or LOC. Pt denies taking any medications for pain PTA. Pt is A&O, in NAD; RR even, regular, and unlabored.

## 2019-12-20 NOTE — ED Notes (Signed)
First Nurse Note:  Pt in via [facility] from home, per EMS, pt with 2 mechanical falls today, complaints of lower back pain.  NAD noted upon arrival.

## 2019-12-21 DIAGNOSIS — N12 Tubulo-interstitial nephritis, not specified as acute or chronic: Secondary | ICD-10-CM | POA: Diagnosis not present

## 2019-12-21 LAB — COMPREHENSIVE METABOLIC PANEL
ALT: 17 U/L (ref 0–44)
AST: 14 U/L — ABNORMAL LOW (ref 15–41)
Albumin: 3.6 g/dL (ref 3.5–5.0)
Alkaline Phosphatase: 71 U/L (ref 38–126)
Anion gap: 12 (ref 5–15)
BUN: 11 mg/dL (ref 6–20)
CO2: 27 mmol/L (ref 22–32)
Calcium: 8.9 mg/dL (ref 8.9–10.3)
Chloride: 101 mmol/L (ref 98–111)
Creatinine, Ser: 0.94 mg/dL (ref 0.61–1.24)
GFR calc Af Amer: >60 mL/min (ref >60)
GFR calc non Af Amer: >60 mL/min (ref >60)
Glucose, Bld: 103 mg/dL — ABNORMAL HIGH (ref 70–99)
Potassium: 3.9 mmol/L (ref 3.5–5.1)
Sodium: 140 mmol/L (ref 135–145)
Total Bilirubin: 0.6 mg/dL (ref 0.3–1.2)
Total Protein: 8 g/dL (ref 6.5–8.1)

## 2019-12-21 LAB — LACTIC ACID, PLASMA: Lactic Acid, Venous: 1.2 mmol/L (ref 0.5–1.9)

## 2019-12-21 MED ORDER — LEVOFLOXACIN 750 MG PO TABS: 750.0000 mg | ORAL_TABLET | Freq: Every day | ORAL | 0 refills | Status: AC

## 2019-12-21 MED ORDER — SODIUM CHLORIDE 0.9 % IV BOLUS
500.0000 mL | Freq: Once | INTRAVENOUS | Status: AC
  Administered 2019-12-21: 500 mL via INTRAVENOUS

## 2019-12-21 MED ORDER — OXYCODONE-ACETAMINOPHEN 5-325 MG PO TABS
1.0000 | ORAL_TABLET | Freq: Once | ORAL | Status: AC
  Administered 2019-12-21: 1 via ORAL
  Filled 2019-12-21: qty 1

## 2019-12-21 NOTE — ED Notes (Signed)
[facility] called for transport home 

## 2019-12-23 LAB — URINE CULTURE
Culture: >=100000 — AB
Special Requests: NORMAL

## 2019-12-25 ENCOUNTER — Telehealth: Payer: Self-pay | Admitting: Family Medicine

## 2019-12-25 NOTE — Telephone Encounter (Signed)
Patient called asking for refill on antibiotic Levaquin.  He was seen by [facility] ED on 12/20/19 dx with Pyelonephritis kidney infection.  Urine culture showed E Coli - it was sensitive to levaquin antibiotic.  He was given Levaquin 750mg  daily for 7 days.  He has 2 more pills left.  He called because he was worried about running out of pills.  Can you call him back and let him know that I do not recommend extending his antibiotic at this time, and that the medicine should continue to run it's course.  If he is worsening or having any significant problem with fever, chills, flank pain, difficulty urinating - then he may need return to hospital ED for further treatment.  He may follow-up with [place] next week if he is improving but has questions still.  Nobie Putnam, DO [facility] 12/25/2019, 1:43 PM

## 2019-12-25 NOTE — Telephone Encounter (Signed)
His number is not working number ?

## 2019-12-30 NOTE — Telephone Encounter (Signed)
Unable to reach the patient not working number.

## 2020-01-11 ENCOUNTER — Encounter: Payer: Self-pay | Admitting: Emergency Medicine

## 2020-01-11 ENCOUNTER — Other Ambulatory Visit: Payer: Self-pay

## 2020-01-11 ENCOUNTER — Emergency Department: Payer: Medicare Other

## 2020-01-11 ENCOUNTER — Emergency Department
Admission: EM | Admit: 2020-01-11 | Discharge: 2020-01-11 | Disposition: A | Discharge status: Home / Self Care | Payer: Medicare Other | Attending: Emergency Medicine | Admitting: Emergency Medicine

## 2020-01-11 DIAGNOSIS — Z8673 Personal history of transient ischemic attack (TIA), and cerebral infarction without residual deficits: Secondary | ICD-10-CM | POA: Diagnosis not present

## 2020-01-11 DIAGNOSIS — S20211A Contusion of right front wall of thorax, initial encounter: Secondary | ICD-10-CM | POA: Diagnosis not present

## 2020-01-11 DIAGNOSIS — F17228 Nicotine dependence, chewing tobacco, with other nicotine-induced disorders: Secondary | ICD-10-CM | POA: Diagnosis not present

## 2020-01-11 DIAGNOSIS — Y998 Other external cause status: Secondary | ICD-10-CM | POA: Insufficient documentation

## 2020-01-11 DIAGNOSIS — W010XXA Fall on same level from slipping, tripping and stumbling without subsequent striking against object, initial encounter: Secondary | ICD-10-CM | POA: Diagnosis not present

## 2020-01-11 DIAGNOSIS — Z8616 Personal history of COVID-19: Secondary | ICD-10-CM | POA: Diagnosis not present

## 2020-01-11 DIAGNOSIS — S79911A Unspecified injury of right hip, initial encounter: Secondary | ICD-10-CM | POA: Diagnosis present

## 2020-01-11 DIAGNOSIS — Z79899 Other long term (current) drug therapy: Secondary | ICD-10-CM | POA: Insufficient documentation

## 2020-01-11 DIAGNOSIS — Y9389 Activity, other specified: Secondary | ICD-10-CM | POA: Insufficient documentation

## 2020-01-11 DIAGNOSIS — S7001XA Contusion of right hip, initial encounter: Secondary | ICD-10-CM | POA: Diagnosis not present

## 2020-01-11 DIAGNOSIS — I10 Essential (primary) hypertension: Secondary | ICD-10-CM | POA: Insufficient documentation

## 2020-01-11 DIAGNOSIS — Y929 Unspecified place or not applicable: Secondary | ICD-10-CM | POA: Diagnosis not present

## 2020-01-11 DIAGNOSIS — W19XXXA Unspecified fall, initial encounter: Secondary | ICD-10-CM

## 2020-01-11 DIAGNOSIS — F17210 Nicotine dependence, cigarettes, uncomplicated: Secondary | ICD-10-CM | POA: Diagnosis not present

## 2020-01-11 MED ORDER — OXYCODONE-ACETAMINOPHEN 5-325 MG PO TABS
1.0000 | ORAL_TABLET | Freq: Once | ORAL | Status: AC
  Administered 2020-01-11: 23:00:00 1 via ORAL
  Filled 2020-01-11: qty 1

## 2020-01-11 NOTE — ED Notes (Signed)
Adjusted pt in bed ?

## 2020-01-11 NOTE — ED Triage Notes (Addendum)
Pt arrived via [facility] with reports of pulling on door handle to open door while another person was pulling on the other side causing the patient to fall onto his buttocks then onto his right side.  Pt c/o pain to buttocks and right side ribs. No distress noted on arrival.  Denies hitting head, no LOC. Mask in place.

## 2020-01-11 NOTE — ED Provider Notes (Signed)
[facility] Emergency Department Provider Note  ____________________________________________  Time seen: Approximately 9:59 PM  I have reviewed the triage vital signs and the nursing notes.   HISTORY  Chief Complaint Fall    HPI Mark Wells is a 49 y.o. male who presents to the emergency department complaining of right rib and right hip pain after a fall.  Patient has spastic hemiplegia of the left side after his CVA.  Patient reports that he was reaching  for door handle when somebody on the other side pulled the door open.  Patient states that this caused him to fall onto his right side landing on his right hip and right chest wall.  He did not hit his head or lose consciousness.  Patient's complaint at this time is rib and hip pain.  No radicular symptoms in the upper or lower extremities.  No other complaints other than rib and hip pain  Patient has a medical history as described below        Past Medical History:  Diagnosis Date  . Anemia   . Anxiety   . Chronic back pain   . COVID-19 06/2019   July 2020  . Depression   . Hemiplegia affecting left nondominant side (HCC)   . Hyperlipidemia   . Mood disorder (HCC)   . Personality disorder (HCC)   . Restless leg syndrome   . Sleep apnea   . Stroke [facility])     Patient Active Problem List   Diagnosis Date Noted  . OSA on CPAP 12/09/2018  . Chronic pain syndrome 12/09/2018  . Major depression, recurrent, chronic (HCC) 12/09/2018  . Insomnia 12/09/2018  . Spastic hemiplegia of left dominant side as late effect of cerebral infarction (HCC) 12/07/2018  . Painful orthopaedic hardware (HCC) 09/03/2018  . BACK PAIN 01/07/2011  . ADVERSE DRUG REACTION 01/01/2011  . Essential hypertension 12/28/2010  . LOW BACK PAIN SYNDROME, SEVERE 12/28/2010  . OTHER URINARY INCONTINENCE 12/28/2010    Past Surgical History:  Procedure Laterality Date  . BACK SURGERY      Prior to Admission medications    Medication Sig Start Date End Date Taking? Authorizing Provider  amLODipine (NORVASC) 5 MG tablet TAKE 1 TABLET BY MOUTH EVERY DAY 10/17/19   Karamalegos, Netta Neat, DO  atorvastatin (LIPITOR) 40 MG tablet Take 1 tablet (40 mg total) by mouth every evening. 08/12/19   Karamalegos, Netta Neat, DO  baclofen (LIORESAL) 20 MG tablet Take 20 mg by mouth as needed.  03/21/19   [provider]  chlorthalidone (HYGROTON) 25 MG tablet TAKE 1 TABLET BY MOUTH EVERY DAY 10/17/19   Althea Charon, Netta Neat, DO  clopidogrel (PLAVIX) 75 MG tablet Take 1 tablet (75 mg total) by mouth daily. 08/12/19   Karamalegos, Netta Neat, DO  lidocaine (LIDODERM) 5 % Place 1 patch onto the skin every 12 (twelve) hours. Remove & Discard patch within 12 hours or as directed by MD 10/18/19 10/17/20  Chesley Noon, MD  losartan (COZAAR) 100 MG tablet TAKE 1 TABLET BY MOUTH EVERY DAY 11/25/19   Karamalegos, Netta Neat, DO  meclizine (ANTIVERT) 25 MG tablet Take 1 tablet (25 mg total) by mouth 3 (three) times daily as needed for dizziness. 02/26/19   Karamalegos, Netta Neat, DO  methocarbamol (ROBAXIN) 500 MG tablet Take 1 tablet (500 mg total) by mouth every 6 (six) hours as needed for muscle spasms. 02/26/19   Karamalegos, Netta Neat, DO  ondansetron (ZOFRAN ODT) 4 MG disintegrating tablet Take 1 tablet (  4 mg total) by mouth every 8 (eight) hours as needed. 07/18/19   Nita Sickle, MD  oxyCODONE-acetaminophen (PERCOCET) 7.5-325 MG tablet Take 1 tablet by mouth 3 (three) times daily as needed for severe pain.    [provider]  oxyCODONE-acetaminophen (PERCOCET) 7.5-325 MG tablet oxycodone-acetaminophen 7.5 mg-325 mg tablet  TAKE 1 TABLET BY MOUTH EVERY 8 HOURS AS NEEDED PAIN. DO NOT FILL BEFORE 04/29/2019    [provider]  potassium chloride (KLOR-CON) 10 MEQ tablet TAKE 1 TABLET (10 MEQ TOTAL) BY MOUTH 2 (TWO) TIMES DAILY. 12/11/19   Karamalegos, Netta Neat, DO  predniSONE (DELTASONE) 20 MG  tablet Take daily with food. Start with 60mg  (3 pills) x 2 days, then reduce to 40mg  (2 pills) x 2 days, then 20mg  (1 pill) x 3 days 10/23/19   , DO  pregabalin (LYRICA) 300 MG capsule Take 300 mg by mouth 2 (two) times daily.     [provider]    Allergies Aspirin, Ibuprofen, and Shellfish allergy  No family history on file.  Social History Social History   Tobacco Use  . Smoking status: Current Every Day Smoker    Packs/day: 0.50    Types: Cigarettes  . Smokeless tobacco: Current User  Substance Use Topics  . Alcohol use: No  . Drug use: No     Review of Systems  Constitutional: No fever/chills Eyes: No visual changes. No discharge ENT: No upper respiratory complaints. Cardiovascular: no chest pain. Respiratory: no cough. No SOB. Gastrointestinal: No abdominal pain.  No nausea, no vomiting.  No diarrhea.  No constipation. Musculoskeletal: Positive for right rib and right hip pain Skin: Negative for rash, abrasions, lacerations, ecchymosis. Neurological: Negative for headaches, focal weakness or numbness. 10-point ROS otherwise negative.  ____________________________________________   PHYSICAL EXAM:  VITAL SIGNS: ED Triage Vitals  Enc Vitals Group     BP 01/11/20 1902 112/69     Pulse Rate 01/11/20 1902 77     Resp 01/11/20 1902 18     Temp 01/11/20 1902 98.3 F (36.8 C)     Temp Source 01/11/20 1902 Oral     SpO2 01/11/20 1902 96 %     Weight 01/11/20 1904 170 lb (77.1 kg)     Height 01/11/20 1904 5\' 6"  (1.676 m)     Head Circumference --      Peak Flow --      Pain Score 01/11/20 1904 10     Pain Loc --      Pain Edu? --      Excl. in GC? --      Constitutional: Alert and oriented. Well appearing and in no acute distress. Eyes: Conjunctivae are normal. PERRL. EOMI. Head: Atraumatic. ENT:      Ears:       Nose: No congestion/rhinnorhea.      Mouth/Throat: Mucous membranes are moist.  Neck: No stridor.  No  cervical spine tenderness to palpation.  Cardiovascular: Normal rate, regular rhythm. Normal S1 and S2.  Good peripheral circulation. Respiratory: Normal respiratory effort without tachypnea or retractions. Lungs CTAB. Good air entry to the bases with no decreased or absent breath sounds. Musculoskeletal: Full range of motion to all extremities. No gross deformities appreciated.  No visible signs of trauma to the right wrist.  No lacerations or abrasions, paradoxical chest wall movement, deformity.  Patient is diffusely tender to palpation along the right side with no point specific tenderness or palpable abnormality.  Good underlying breath sounds bilaterally.  Examination of the right hip reveals no shortening or rotation of the right lower extremity.  No lacerations, abrasions.  Patient is tender to palpation over the lateral aspect, mostly over the proximal femur and greater trochanter region.  No palpable abnormality.  Dorsalis pedis pulses sensation intact distally.  Examination of the lumbar spine, right knee, right ankle is unremarkable. Neurologic:  Normal speech and language. No gross focal neurologic deficits are appreciated.  Skin:  Skin is warm, dry and intact. No rash noted. Psychiatric: Mood and affect are normal. Speech and behavior are normal. Patient exhibits appropriate insight and judgement.   ____________________________________________   LABS (all labs ordered are listed, but only abnormal results are displayed)  Labs Reviewed - No data to display ____________________________________________  EKG   ____________________________________________  RADIOLOGY I personally viewed and evaluated these images as part of my medical decision making, as well as reviewing the written report by the radiologist.  DG Ribs Unilateral W/Chest Right  Result Date: 01/11/2020 CLINICAL DATA:  Fall, right rib pain EXAM: RIGHT RIBS AND CHEST - 3+ VIEW COMPARISON:  Radiograph 07/12/2019  FINDINGS: No visible displaced rib fracture or other acute traumatic osseous injury in the imaged chest. No pneumothorax or effusion. No consolidation or features of edema. The cardiomediastinal contours are unremarkable. Degenerative changes present in the shoulders. IMPRESSION: No acute traumatic osseous injury in the imaged chest. Electronically Signed   By: Lovena Le M.D.   On: 01/11/2020 22:52   DG Hip Unilat W or Wo Pelvis 2-3 Views Right  Result Date: 01/11/2020 CLINICAL DATA:  Fall, right hip pain EXAM: DG HIP (WITH OR WITHOUT PELVIS) 2-3V RIGHT COMPARISON:  Radiograph 12/11/2019, CT 10/18/2019, lumbar radiograph 10/18/2019 FINDINGS: Bones of the pelvis are intact and congruent. No abnormal diastatic widening of the symphysis pubis or SI joints. Femoral heads remain normally located. No proximal femoral fractures are identified. Periarticular spurring of the right femoral head neck junction is similar to prior. Stable soft tissue mineralization seen just superior to the right acetabulum. Mild degenerative changes in both hips as well as in the lower lumbar spine. Evidence of prior lumbosacral fusion with interbody spacer and several unchanged screw fragments at the L5-S1 levels. IMPRESSION: No acute fracture or dislocation of the pelvis or right hip. Electronically Signed   By: Lovena Le M.D.   On: 01/11/2020 22:51    ____________________________________________    PROCEDURES  Procedure(s) performed:    Procedures    Medications  oxyCODONE-acetaminophen (PERCOCET/ROXICET) 5-325 MG per tablet 1 tablet (1 tablet Oral Given 01/11/20 2249)     ____________________________________________   INITIAL IMPRESSION / ASSESSMENT AND PLAN / ED COURSE  Pertinent labs & imaging results that were available during my care of the patient were reviewed by me and considered in my medical decision making (see chart for details).  Review of the [place] CSRS was performed in accordance of the [place]  prior to dispensing any controlled drugs.           Patient's diagnosis is consistent with fall, right rib and right hip contusion.  Patient presented to emergency department for evaluation of right rib and right hip pain after mechanical fall.  Patient has spastic hemiplegia of the left side after his CVA.  He states that he was reaching for door that was open from the other side causing him the fall.  He did not hit his head or lose consciousness.  Exam was overall reassuring.  Imaging reveals no acute traumatic findings.  Patient  is stable for discharge at this time.  He may take his previously prescribed pain medications for symptom relief.  Follow-up primary care as needed..  Patient is given ED precautions to return to the ED for any worsening or new symptoms.     ____________________________________________  FINAL CLINICAL IMPRESSION(S) / ED DIAGNOSES  Final diagnoses:  Fall, initial encounter  Contusion of right hip, initial encounter  Contusion of rib on right side, initial encounter      NEW MEDICATIONS STARTED DURING THIS VISIT:  ED Discharge Orders    None          This chart was dictated using voice recognition software/Dragon. Despite best efforts to proofread, errors can occur which can change the meaning. Any change was purely unintentional.    Racheal Patches, PA-C 01/11/20 2307    Shaune Pollack, MD 01/13/20 1229

## 2020-01-20 ENCOUNTER — Emergency Department
Admission: EM | Admit: 2020-01-20 | Discharge: 2020-01-20 | Disposition: A | Discharge status: Home / Self Care | Payer: Medicare Other | Attending: Emergency Medicine | Admitting: Emergency Medicine

## 2020-01-20 ENCOUNTER — Emergency Department: Payer: Medicare Other

## 2020-01-20 ENCOUNTER — Other Ambulatory Visit: Payer: Self-pay

## 2020-01-20 DIAGNOSIS — Z79899 Other long term (current) drug therapy: Secondary | ICD-10-CM | POA: Insufficient documentation

## 2020-01-20 DIAGNOSIS — Y939 Activity, unspecified: Secondary | ICD-10-CM | POA: Diagnosis not present

## 2020-01-20 DIAGNOSIS — Y92009 Unspecified place in unspecified non-institutional (private) residence as the place of occurrence of the external cause: Secondary | ICD-10-CM | POA: Diagnosis not present

## 2020-01-20 DIAGNOSIS — S7002XA Contusion of left hip, initial encounter: Secondary | ICD-10-CM | POA: Diagnosis not present

## 2020-01-20 DIAGNOSIS — S299XXA Unspecified injury of thorax, initial encounter: Secondary | ICD-10-CM | POA: Diagnosis present

## 2020-01-20 DIAGNOSIS — I69354 Hemiplegia and hemiparesis following cerebral infarction affecting left non-dominant side: Secondary | ICD-10-CM | POA: Insufficient documentation

## 2020-01-20 DIAGNOSIS — F17210 Nicotine dependence, cigarettes, uncomplicated: Secondary | ICD-10-CM | POA: Diagnosis not present

## 2020-01-20 DIAGNOSIS — S20212A Contusion of left front wall of thorax, initial encounter: Secondary | ICD-10-CM | POA: Insufficient documentation

## 2020-01-20 DIAGNOSIS — W0110XA Fall on same level from slipping, tripping and stumbling with subsequent striking against unspecified object, initial encounter: Secondary | ICD-10-CM | POA: Diagnosis not present

## 2020-01-20 DIAGNOSIS — Y999 Unspecified external cause status: Secondary | ICD-10-CM | POA: Diagnosis not present

## 2020-01-20 DIAGNOSIS — W19XXXA Unspecified fall, initial encounter: Secondary | ICD-10-CM

## 2020-01-20 MED ORDER — OXYCODONE-ACETAMINOPHEN 5-325 MG PO TABS
1.0000 | ORAL_TABLET | Freq: Once | ORAL | Status: AC
  Administered 2020-01-20: 1 via ORAL
  Filled 2020-01-20: qty 1

## 2020-01-20 NOTE — Discharge Instructions (Signed)
Continue previous medications.  Follow-up prescribing doctor to refill your narcotic medicines.

## 2020-01-20 NOTE — ED Notes (Signed)
See triage note  Presents s/p fall  States he fell on Saturday and again today Having some pain to chest and hip pain

## 2020-01-20 NOTE — ED Triage Notes (Signed)
Pt states he lost his balance and fell on Saturday and today, c/o left chest and hip pain. Hx of stroke with left sided deficit.

## 2020-01-20 NOTE — ED Notes (Signed)
Pt verbalized understanding of discharge instructions. NAD at this time. 

## 2020-01-20 NOTE — ED Provider Notes (Signed)
[facility] Emergency Department Provider Note   ____________________________________________   First MD Initiated Contact with Patient 01/20/20 1404     (approximate)  I have reviewed the triage vital signs and the nursing notes.   HISTORY  Chief Complaint Fall    HPI Mark Wells is a 49 y.o. male patient complain of left hip and chest pain.  Patient is a lost his balance started and fell.  Patient state multiple falls secondary to CVA affecting the left side.  Patient said another fall today on the same side prompted visit to the ED.  Patient asking for refill of his Percocets that he has run out of medication from his pain medicine doctor.  Patient rates his pain as 10/10.  Patient described the pain as "achy".       Past Medical History:  Diagnosis Date  . Anemia   . Anxiety   . Chronic back pain   . COVID-19 06/2019   July 2020  . Depression   . Hemiplegia affecting left nondominant side (HCC)   . Hyperlipidemia   . Mood disorder (HCC)   . Personality disorder (HCC)   . Restless leg syndrome   . Sleep apnea   . Stroke [facility])     Patient Active Problem List   Diagnosis Date Noted  . OSA on CPAP 12/09/2018  . Chronic pain syndrome 12/09/2018  . Major depression, recurrent, chronic (HCC) 12/09/2018  . Insomnia 12/09/2018  . Spastic hemiplegia of left dominant side as late effect of cerebral infarction (HCC) 12/07/2018  . Painful orthopaedic hardware (HCC) 09/03/2018  . BACK PAIN 01/07/2011  . ADVERSE DRUG REACTION 01/01/2011  . Essential hypertension 12/28/2010  . LOW BACK PAIN SYNDROME, SEVERE 12/28/2010  . OTHER URINARY INCONTINENCE 12/28/2010    Past Surgical History:  Procedure Laterality Date  . BACK SURGERY      Prior to Admission medications   Medication Sig Start Date End Date Taking? Authorizing Provider  amLODipine (NORVASC) 5 MG tablet TAKE 1 TABLET BY MOUTH EVERY DAY 10/17/19   Karamalegos, Netta Neat, DO   atorvastatin (LIPITOR) 40 MG tablet Take 1 tablet (40 mg total) by mouth every evening. 08/12/19   Karamalegos, Netta Neat, DO  baclofen (LIORESAL) 20 MG tablet Take 20 mg by mouth as needed.  03/21/19   [provider]  chlorthalidone (HYGROTON) 25 MG tablet TAKE 1 TABLET BY MOUTH EVERY DAY 10/17/19   Althea Charon, Netta Neat, DO  clopidogrel (PLAVIX) 75 MG tablet Take 1 tablet (75 mg total) by mouth daily. 08/12/19   Karamalegos, Netta Neat, DO  lidocaine (LIDODERM) 5 % Place 1 patch onto the skin every 12 (twelve) hours. Remove & Discard patch within 12 hours or as directed by MD 10/18/19 10/17/20  Chesley Noon, MD  losartan (COZAAR) 100 MG tablet TAKE 1 TABLET BY MOUTH EVERY DAY 11/25/19   Karamalegos, Netta Neat, DO  meclizine (ANTIVERT) 25 MG tablet Take 1 tablet (25 mg total) by mouth 3 (three) times daily as needed for dizziness. 02/26/19   Karamalegos, Netta Neat, DO  methocarbamol (ROBAXIN) 500 MG tablet Take 1 tablet (500 mg total) by mouth every 6 (six) hours as needed for muscle spasms. 02/26/19   Karamalegos, Netta Neat, DO  ondansetron (ZOFRAN ODT) 4 MG disintegrating tablet Take 1 tablet (4 mg total) by mouth every 8 (eight) hours as needed. 07/18/19   Nita Sickle, MD  oxyCODONE-acetaminophen (PERCOCET) 7.5-325 MG tablet Take 1 tablet by mouth 3 (three) times daily as  needed for severe pain.    [provider]  oxyCODONE-acetaminophen (PERCOCET) 7.5-325 MG tablet oxycodone-acetaminophen 7.5 mg-325 mg tablet  TAKE 1 TABLET BY MOUTH EVERY 8 HOURS AS NEEDED PAIN. DO NOT FILL BEFORE 04/29/2019    [provider]  potassium chloride (KLOR-CON) 10 MEQ tablet TAKE 1 TABLET (10 MEQ TOTAL) BY MOUTH 2 (TWO) TIMES DAILY. 12/11/19   Karamalegos, Devonne Doughty, DO  predniSONE (DELTASONE) 20 MG tablet Take daily with food. Start with 60mg  (3 pills) x 2 days, then reduce to 40mg  (2 pills) x 2 days, then 20mg  (1 pill) x 3 days 10/23/19   Olin Hauser, DO   pregabalin (LYRICA) 300 MG capsule Take 300 mg by mouth 2 (two) times daily.     [provider]    Allergies Aspirin, Ibuprofen, and Shellfish allergy  No family history on file.  Social History Social History   Tobacco Use  . Smoking status: Current Every Day Smoker    Packs/day: 0.50    Types: Cigarettes  . Smokeless tobacco: Current User  Substance Use Topics  . Alcohol use: No  . Drug use: No    Review of Systems Constitutional: No fever/chills Eyes: No visual changes. ENT: No sore throat. Cardiovascular: Denies chest pain. Respiratory: Denies shortness of breath. Gastrointestinal: No abdominal pain.  No nausea, no vomiting.  No diarrhea.  No constipation. Genitourinary: Negative for dysuria. Musculoskeletal: Chronic back pain. Skin: Negative for rash. Neurological: Hemiplegic left side.   {**Psychiatric: Anxiety and depression.  Endocrine:  Hyperlipidemia Hematological/Lymphatic:  Allergic/Immunilogical: Aspirin, ibuprofen, and shellfish.  ____________________________________________   PHYSICAL EXAM:  VITAL SIGNS: ED Triage Vitals  Enc Vitals Group     BP 01/20/20 1354 116/74     Pulse Rate 01/20/20 1354 82     Resp 01/20/20 1354 17     Temp 01/20/20 1354 98.5 F (36.9 C)     Temp Source 01/20/20 1354 Oral     SpO2 01/20/20 1354 98 %     Weight 01/20/20 1356 170 lb (77.1 kg)     Height 01/20/20 1356 5\' 6"  (1.676 m)     Head Circumference --      Peak Flow --      Pain Score 01/20/20 1356 10     Pain Loc --      Pain Edu? --      Excl. in [place]? --    Constitutional: Alert and oriented. Well appearing and in no acute distress. Neck: No stridor.  No cervical spine tenderness to palpation. Hematological/Lymphatic/Immunilogical: No cervical lymphadenopathy. Cardiovascular: Normal rate, regular rhythm. Grossly normal heart sounds.  Good peripheral circulation. Respiratory: Normal respiratory effort.  No retractions. Lungs  CTAB. Gastrointestinal: Soft and nontender. No distention. No abdominal bruits. No CVA tenderness. Musculoskeletal: No lower extremity tenderness nor edema.  No joint effusions. Neurologic:  Normal speech and language. No gross focal neurologic deficits are appreciated. No gait instability. Skin:  Skin is warm, dry and intact. No rash noted. Psychiatric: Mood and affect are normal. Speech and behavior are normal.  ____________________________________________   LABS (all labs ordered are listed, but only abnormal results are displayed)  Labs Reviewed - No data to display ____________________________________________  EKG   ____________________________________________  RADIOLOGY  ED MD interpretation:    Official radiology report(s): DG Ribs Unilateral W/Chest Left  Result Date: 01/20/2020 CLINICAL DATA:  Fall, left chest pain EXAM: LEFT RIBS AND CHEST - 3+ VIEW COMPARISON:  None. FINDINGS: No fracture or other bone lesions are  seen involving the ribs. There is no evidence of pneumothorax or pleural effusion. Both lungs are clear. Heart size and mediastinal contours are within normal limits. IMPRESSION: Negative. Electronically Signed   By: Guadlupe Spanish M.D.   On: 01/20/2020 15:10   DG Hip Unilat W or Wo Pelvis 2-3 Views Left  Result Date: 01/20/2020 CLINICAL DATA:  Fall 2 days ago with left hip pain. EXAM: DG HIP (WITH OR WITHOUT PELVIS) 2-3V LEFT COMPARISON:  12/11/2019 FINDINGS: No evidence of acute fracture or dislocation. Remainder the exam is unchanged. IMPRESSION: No acute findings. Electronically Signed   By: Elberta Fortis M.D.   On: 01/20/2020 15:10    ____________________________________________   PROCEDURES  Procedure(s) performed (including Critical Care):  Procedures   ____________________________________________   INITIAL IMPRESSION / ASSESSMENT AND PLAN / ED COURSE  As part of my medical decision making, I reviewed the following data within the  electronic MEDICAL RECORD NUMBER     Patient presents with left chest wall and hip pain secondary to a fall.  Patient has multiple falls secondary to left hemiplegic.  Discussed negative chest and hip tray findings with patient.  Physical exam consistent with contusion secondary to fall.  Patient was offered and refused Lidoderm patches.  Advised patient has narcotic medication must be filled by his prescribing pain management doctor.   Amal Saiki was evaluated in Emergency Department on 01/20/2020 for the symptoms described in the history of present illness. He was evaluated in the context of the global COVID-19 pandemic, which necessitated consideration that the patient might be at risk for infection with the SARS-CoV-2 virus that causes COVID-19. Institutional protocols and algorithms that pertain to the evaluation of patients at risk for COVID-19 are in a state of rapid change based on information released by regulatory bodies including the CDC and federal and state organizations. These policies and algorithms were followed during the patient's care in the ED.       ____________________________________________   FINAL CLINICAL IMPRESSION(S) / ED DIAGNOSES  Final diagnoses:  Fall in home, initial encounter  Chest wall contusion, left, initial encounter  Contusion of left hip, initial encounter     ED Discharge Orders    None       Note:  This document was prepared using Dragon voice recognition software and may include unintentional dictation errors.    Joni Reining, PA-C 01/20/20 1527    Chesley Noon, MD 01/21/20 1640

## 2020-01-21 ENCOUNTER — Encounter: Payer: Self-pay | Admitting: Family Medicine

## 2020-01-21 ENCOUNTER — Ambulatory Visit (INDEPENDENT_AMBULATORY_CARE_PROVIDER_SITE_OTHER): Payer: Medicare Other | Admitting: Family Medicine

## 2020-01-21 VITALS — BP 130/79 | Ht 66.0 in | Wt 200.0 lb

## 2020-01-21 DIAGNOSIS — R296 Repeated falls: Secondary | ICD-10-CM | POA: Diagnosis not present

## 2020-01-21 DIAGNOSIS — G8929 Other chronic pain: Secondary | ICD-10-CM

## 2020-01-21 DIAGNOSIS — M545 Low back pain: Secondary | ICD-10-CM | POA: Diagnosis not present

## 2020-01-21 DIAGNOSIS — I69352 Hemiplegia and hemiparesis following cerebral infarction affecting left dominant side: Secondary | ICD-10-CM | POA: Diagnosis not present

## 2020-01-21 DIAGNOSIS — G894 Chronic pain syndrome: Secondary | ICD-10-CM | POA: Diagnosis not present

## 2020-01-21 NOTE — Progress Notes (Signed)
Virtual Visit via Telephone The purpose of this virtual visit is to provide medical care while limiting exposure to the novel coronavirus (COVID19) for both patient and office staff.  Consent was obtained for phone visit:  Yes.   Answered questions that patient had about telehealth interaction:  Yes.   I discussed the limitations, risks, security and privacy concerns of performing an evaluation and management service by telephone. I also discussed with the patient that there may be a patient responsible charge related to this service. The patient expressed understanding and agreed to proceed.  Patient Location: Home Provider Location: [facility])  ---------------------------------------------------------------------- Chief Complaint  Patient presents with  . chronic pain syndrome    need referral for PT    S: Reviewed CMA documentation. I have called patient and gathered additional HPI as follows:  Chronic Pain Syndrome / Osteoarthritis Multiple Joints / Lumbar Spine DJD disc herniation L5 Background history of disc herniation 2006, he had up to 5 surgeries in 2 years, in [place] - Recent surgical history, he had a revision of back hardware by Dr Jordan Likes - 08/2018 - Follows with [facility] for spine specialist for injections and has seen their pain provider AlsoFollowed by [facility], Pain Management - Oxycodone-Acetaminophen 7.5/325mg  #90 on on 12/20/19, and Lyrica 300mg  #60 on 01/07/20 - He has improved with PRN Prednisone as well with steroid taper but had limited results. I have treated him with Robaxin muscle relaxant PRN with some relief, - he has had HH PT in past as well with benefit, but now he would like to be referred to [facility] in [place] for PT therapy   Denies any high risk travel to areas of current concern for COVID19. Denies any known or suspected exposure to person with or possibly with COVID19.  Denies any fevers, chills, sweats,  body ache, cough, shortness of breath, sinus pain or pressure, headache, abdominal pain, diarrhea  Past Medical History:  Diagnosis Date  . Anemia   . Anxiety   . Chronic back pain   . COVID-19 06/2019   July 2020  . Depression   . Hemiplegia affecting left nondominant side (HCC)   . Hyperlipidemia   . Mood disorder (HCC)   . Personality disorder (HCC)   . Restless leg syndrome   . Sleep apnea   . Stroke [facility])    Social History   Tobacco Use  . Smoking status: Current Every Day Smoker    Packs/day: 0.50    Types: Cigarettes  . Smokeless tobacco: Current User  Substance Use Topics  . Alcohol use: No  . Drug use: No    Current Outpatient Medications:  .  amLODipine (NORVASC) 5 MG tablet, TAKE 1 TABLET BY MOUTH EVERY DAY, Disp: 90 tablet, Rfl: 1 .  atorvastatin (LIPITOR) 40 MG tablet, Take 1 tablet (40 mg total) by mouth every evening., Disp: 90 tablet, Rfl: 1 .  baclofen (LIORESAL) 20 MG tablet, Take 20 mg by mouth as needed. , Disp: , Rfl:  .  chlorthalidone (HYGROTON) 25 MG tablet, TAKE 1 TABLET BY MOUTH EVERY DAY, Disp: 90 tablet, Rfl: 1 .  clopidogrel (PLAVIX) 75 MG tablet, Take 1 tablet (75 mg total) by mouth daily., Disp: 90 tablet, Rfl: 1 .  lidocaine (LIDODERM) 5 %, Place 1 patch onto the skin every 12 (twelve) hours. Remove & Discard patch within 12 hours or as directed by MD, Disp: 10 patch, Rfl: 0 .  losartan (COZAAR) 100 MG tablet, TAKE 1  TABLET BY MOUTH EVERY DAY, Disp: 90 tablet, Rfl: 1 .  meclizine (ANTIVERT) 25 MG tablet, Take 1 tablet (25 mg total) by mouth 3 (three) times daily as needed for dizziness., Disp: 90 tablet, Rfl: 1 .  methocarbamol (ROBAXIN) 500 MG tablet, Take 1 tablet (500 mg total) by mouth every 6 (six) hours as needed for muscle spasms., Disp: 120 tablet, Rfl: 2 .  ondansetron (ZOFRAN ODT) 4 MG disintegrating tablet, Take 1 tablet (4 mg total) by mouth every 8 (eight) hours as needed., Disp: 20 tablet, Rfl: 0 .  oxyCODONE-acetaminophen  (PERCOCET) 7.5-325 MG tablet, Take 1 tablet by mouth 3 (three) times daily as needed for severe pain., Disp: , Rfl:  .  oxyCODONE-acetaminophen (PERCOCET) 7.5-325 MG tablet, oxycodone-acetaminophen 7.5 mg-325 mg tablet  TAKE 1 TABLET BY MOUTH EVERY 8 HOURS AS NEEDED PAIN. DO NOT FILL BEFORE 04/29/2019, Disp: , Rfl:  .  potassium chloride (KLOR-CON) 10 MEQ tablet, TAKE 1 TABLET (10 MEQ TOTAL) BY MOUTH 2 (TWO) TIMES DAILY., Disp: 180 tablet, Rfl: 1 .  pregabalin (LYRICA) 300 MG capsule, Take 300 mg by mouth 2 (two) times daily. , Disp: , Rfl:  .  predniSONE (DELTASONE) 20 MG tablet, Take daily with food. Start with 60mg  (3 pills) x 2 days, then reduce to 40mg  (2 pills) x 2 days, then 20mg  (1 pill) x 3 days (Patient not taking: Reported on 01/21/2020), Disp: 13 tablet, Rfl: 0  Depression screen [facility] 2/9 01/21/2020 10/23/2019 08/20/2019  Decreased Interest 0 1 0  Down, Depressed, Hopeless 0 3 0  PHQ - 2 Score 0 4 0  Altered sleeping - 1 -  Tired, decreased energy - 1 -  Change in appetite - 1 -  Feeling bad or failure about yourself  - 1 -  Trouble concentrating - 1 -  Moving slowly or fidgety/restless - 1 -  Suicidal thoughts - 1 -  PHQ-9 Score - 11 -  Difficult doing work/chores - Somewhat difficult -    GAD 7 : Generalized Anxiety Score 10/23/2019  Nervous, Anxious, on Edge 1  Control/stop worrying 1  Worry too much - different things 1  Trouble relaxing 1  Restless 1  Easily annoyed or irritable 1  Afraid - awful might happen 1  Total GAD 7 Score 7  Anxiety Difficulty Somewhat difficult    -------------------------------------------------------------------------- O: No physical exam performed due to remote telephone encounter.  Lab results reviewed.  Recent Results (from the past 2160 hour(s))  Basic metabolic panel     Status: Abnormal   Collection Time: 12/11/19  2:47 AM  Result Value Ref Range   Sodium 136 135 - 145 mmol/L   Potassium 3.5 3.5 - 5.1 mmol/L   Chloride 100 98  - 111 mmol/L   CO2 23 22 - 32 mmol/L   Glucose, Bld 112 (H) 70 - 99 mg/dL   BUN 11 6 - 20 mg/dL   Creatinine, Ser 0.74 0.61 - 1.24 mg/dL   Calcium 8.9 8.9 - 10.3 mg/dL   GFR calc non Af Amer >60 >60 mL/min   GFR calc Af Amer >60 >60 mL/min   Anion gap 13 5 - 15    Comment: Performed at [facility], [address] [place], [place] [address]  Magnesium     Status: None   Collection Time: 12/11/19  2:47 AM  Result Value Ref Range   Magnesium 1.9 1.7 - 2.4 mg/dL    Comment: Performed at [facility], [address] [place], [place] [address]  Comprehensive metabolic panel     Status: None   Collection Time: 12/13/19  6:38 PM  Result Value Ref Range   Sodium 139 135 - 145 mmol/L   Potassium 3.8 3.5 - 5.1 mmol/L   Chloride 103 98 - 111 mmol/L   CO2 29 22 - 32 mmol/L   Glucose, Bld 93 70 - 99 mg/dL   BUN 8 6 - 20 mg/dL   Creatinine, Ser 3.41 0.61 - 1.24 mg/dL   Calcium 9.2 8.9 - 96.2 mg/dL   Total Protein 7.5 6.5 - 8.1 g/dL   Albumin 3.9 3.5 - 5.0 g/dL   AST 17 15 - 41 U/L   ALT 17 0 - 44 U/L   Alkaline Phosphatase 74 38 - 126 U/L   Total Bilirubin 0.5 0.3 - 1.2 mg/dL   GFR calc non Af Amer >60 >60 mL/min   GFR calc Af Amer >60 >60 mL/min   Anion gap 7 5 - 15    Comment: Performed at [facility], [address] [place], [place] [address]  CBC     Status: None   Collection Time: 12/13/19  6:38 PM  Result Value Ref Range   WBC 8.1 4.0 - 10.5 K/uL   RBC 4.48 4.22 - 5.81 MIL/uL   Hemoglobin 13.8 13.0 - 17.0 g/dL   HCT 89.2 11.9 - 41.7 %   MCV 92.6 80.0 - 100.0 fL   MCH 30.8 26.0 - 34.0 pg   MCHC 33.3 30.0 - 36.0 g/dL   RDW 40.8 14.4 - 81.8 %   Platelets 266 150 - 400 K/uL   nRBC 0.0 0.0 - 0.2 %    Comment: Performed at [facility], [address] [place] [address] [place], [place] [address]  SARS CORONAVIRUS 2 (TAT 6-24 HRS) Nasopharyngeal Nasopharyngeal Swab     Status: None   Collection Time: 12/13/19  7:23 PM   Specimen: Nasopharyngeal Swab   Result Value Ref Range   SARS Coronavirus 2 NEGATIVE NEGATIVE    Comment: (NOTE) SARS-CoV-2 target nucleic acids are NOT DETECTED. The SARS-CoV-2 RNA is generally detectable in upper and lower respiratory specimens during the acute phase of infection. Negative results do not preclude SARS-CoV-2 infection, do not rule out co-infections with other pathogens, and should not be used as the sole basis for treatment or other patient management decisions. Negative results must be combined with clinical observations, patient history, and epidemiological information. The expected result is Negative. Fact Sheet for Patients: HairSlick.no Fact Sheet for Healthcare Providers: quierodirigir.com This test is not yet approved or cleared by the [place] FDA and  has been authorized for detection and/or diagnosis of SARS-CoV-2 by FDA under an Emergency Use Authorization (EUA). This EUA will remain  in effect (meaning this test can be used) for the duration of the COVID-19 declaration under Section 56 4(b)(1) of the Act, 21 U.S.C. section 360bbb-3(b)(1), unless the authorization is terminated or revoked sooner. Performed at [facility], [address] [place], [place] [address]   Urinalysis, Complete w Microscopic     Status: Abnormal   Collection Time: 12/20/19 10:13 PM  Result Value Ref Range   Color, Urine YELLOW (A) YELLOW   APPearance CLOUDY (A) CLEAR   Specific Gravity, Urine 1.008 1.005 - 1.030   pH 5.0 5.0 - 8.0   Glucose, UA NEGATIVE NEGATIVE mg/dL   Hgb urine dipstick LARGE (A) NEGATIVE   Bilirubin Urine NEGATIVE NEGATIVE   Ketones, ur NEGATIVE NEGATIVE  mg/dL   Protein, ur 540 (A) NEGATIVE mg/dL   Nitrite POSITIVE (A) NEGATIVE   Leukocytes,Ua LARGE (A) NEGATIVE   RBC / HPF >50 (H) 0 - 5 RBC/hpf   WBC, UA >50 (H) 0 - 5 WBC/hpf   Bacteria, UA MANY (A) NONE SEEN   Squamous Epithelial / LPF NONE SEEN 0 - 5   WBC  Clumps PRESENT    Mucus PRESENT     Comment: Performed at [facility], [address] [place], [place] [address]  Urine culture     Status: Abnormal   Collection Time: 12/20/19 10:13 PM   Specimen: Urine, Clean Catch  Result Value Ref Range   Specimen Description      URINE, CLEAN CATCH Performed at [facility], [address], [place], [place] [address]    Special Requests      Normal Performed at [facility], [address] [place], [place] [address]    Culture >=100,000 COLONIES/mL ESCHERICHIA COLI (A)    Report Status 12/23/2019 FINAL    Organism ID, Bacteria ESCHERICHIA COLI (A)       Susceptibility   Escherichia coli - MIC*    AMPICILLIN >=32 RESISTANT Resistant     CEFAZOLIN 16 SENSITIVE Sensitive     CEFTRIAXONE <=1 SENSITIVE Sensitive     CIPROFLOXACIN <=0.25 SENSITIVE Sensitive     GENTAMICIN <=1 SENSITIVE Sensitive     IMIPENEM <=0.25 SENSITIVE Sensitive     NITROFURANTOIN <=16 SENSITIVE Sensitive     TRIMETH/SULFA <=20 SENSITIVE Sensitive     AMPICILLIN/SULBACTAM >=32 RESISTANT Resistant     PIP/TAZO 8 SENSITIVE Sensitive     * >=100,000 COLONIES/mL ESCHERICHIA COLI  CBC with Differential     Status: Abnormal   Collection Time: 12/20/19 11:45 PM  Result Value Ref Range   WBC 14.7 (H) 4.0 - 10.5 K/uL   RBC 4.74 4.22 - 5.81 MIL/uL   Hemoglobin 14.1 13.0 - 17.0 g/dL   HCT 24.5 80.9 - 98.3 %   MCV 88.4 80.0 - 100.0 fL   MCH 29.7 26.0 - 34.0 pg   MCHC 33.7 30.0 - 36.0 g/dL   RDW 38.2 50.5 - 39.7 %   Platelets 323 150 - 400 K/uL   nRBC 0.0 0.0 - 0.2 %   Neutrophils Relative % 79 %   Neutro Abs 11.4 (H) 1.7 - 7.7 K/uL   Lymphocytes Relative 14 %   Lymphs Abs 2.1 0.7 - 4.0 K/uL   Monocytes Relative 7 %   Monocytes Absolute 1.0 0.1 - 1.0 K/uL   Eosinophils Relative 0 %   Eosinophils Absolute 0.0 0.0 - 0.5 K/uL   Basophils Relative 0 %   Basophils Absolute 0.0 0.0 - 0.1 K/uL   Immature Granulocytes 0 %   Abs Immature  Granulocytes 0.06 0.00 - 0.07 K/uL    Comment: Performed at [facility], [address] [place], [place] [address]  Comprehensive metabolic panel     Status: Abnormal   Collection Time: 12/20/19 11:45 PM  Result Value Ref Range   Sodium 140 135 - 145 mmol/L   Potassium 3.9 3.5 - 5.1 mmol/L   Chloride 101 98 - 111 mmol/L   CO2 27 22 - 32 mmol/L   Glucose, Bld 103 (H) 70 - 99 mg/dL   BUN 11 6 - 20 mg/dL   Creatinine, Ser 9.37 0.61 - 1.24 mg/dL   Calcium 8.9 8.9 - 90.2 mg/dL   Total Protein 8.0 6.5 - 8.1 g/dL  Albumin 3.6 3.5 - 5.0 g/dL   AST 14 (L) 15 - 41 U/L   ALT 17 0 - 44 U/L   Alkaline Phosphatase 71 38 - 126 U/L   Total Bilirubin 0.6 0.3 - 1.2 mg/dL   GFR calc non Af Amer >60 >60 mL/min   GFR calc Af Amer >60 >60 mL/min   Anion gap 12 5 - 15    Comment: Performed at [facility], [address] [place], [place] [address]  Lactic acid, plasma     Status: None   Collection Time: 12/20/19 11:45 PM  Result Value Ref Range   Lactic Acid, Venous 1.2 0.5 - 1.9 mmol/L    Comment: Performed at [facility], [address] [place], [place] [address]    -------------------------------------------------------------------------- A&P:  Problem List Items Addressed This Visit    Spastic hemiplegia of left dominant side as late effect of cerebral infarction [facility](HCC) - Primary   Relevant Orders   Ambulatory referral to Physical Therapy   LOW BACK PAIN SYNDROME, SEVERE   Relevant Orders   Ambulatory referral to Physical Therapy   Chronic pain syndrome   Relevant Orders   Ambulatory referral to Physical Therapy    Other Visit Diagnoses    Recurrent falls       Relevant Orders   Ambulatory referral to Physical Therapy     Clinically with constellation of chronic medical problems related to chronic pain and weakness, underlying Chronic Residual Left sided upper extremity hemiplegia secondary to CVA/Stroke, and underlying Osteoarthritis degenerative  disease multiple joints and Lumbar Spine. - He is debilitated by chronic pain and limited mobility - Follow[facility] for [facility] management, and [facility] in past.  Continue current therapy, no change to pain medicine today. No recent prednisone burst. Last done taper in 09/2019 it seems based on chart review.  [facility].   Orders Placed This Encounter  Procedures  . Ambulatory referral to Physical Therapy    Referral Priority:   Routine    Referral Type:   Physical Medicine    Referral Reason:   Specialty Services Required    Requested Specialty:   Physical Therapy    Number of Visits Requested:   1      No orders of the defined types were placed in this encounter.   Follow-up: - Return as needed  Patient verbalizes understanding with the above medical recommendations including the limitation of remote medical advice.  Specific follow-up and call-back criteria were given for patient to follow-up or seek medical care more urgently if needed.   - Time spent in direct consultation with patient on phone: 7 minutes   Saralyn PilarAlexander Thana Ramp, DO [facility] 01/21/2020, 9:57 AM

## 2020-01-28 ENCOUNTER — Ambulatory Visit: Payer: Medicare Other | Attending: Family Medicine | Admitting: Physical Therapy

## 2020-01-30 ENCOUNTER — Ambulatory Visit: Payer: Medicare Other | Admitting: Physical Therapy

## 2020-02-04 ENCOUNTER — Ambulatory Visit: Payer: Medicare Other | Admitting: Physical Therapy

## 2020-02-06 ENCOUNTER — Ambulatory Visit: Payer: Medicare Other | Admitting: Physical Therapy

## 2020-02-11 ENCOUNTER — Ambulatory Visit: Payer: Medicare Other | Admitting: Physical Therapy

## 2020-02-13 ENCOUNTER — Ambulatory Visit: Payer: Medicare Other | Admitting: Physical Therapy

## 2020-02-25 ENCOUNTER — Ambulatory Visit: Payer: Medicare Other | Admitting: Physical Therapy

## 2020-02-27 ENCOUNTER — Ambulatory Visit: Payer: Medicare Other | Admitting: Physical Therapy

## 2020-02-28 IMAGING — CR DG RIBS W/ CHEST 3+V*L*
3 series · 3 of 3 positions shown · non-contrast
Comparison: None.

CLINICAL DATA: Fall, left chest pain

EXAM:
LEFT RIBS AND CHEST - 3+ VIEW

[chest pa]
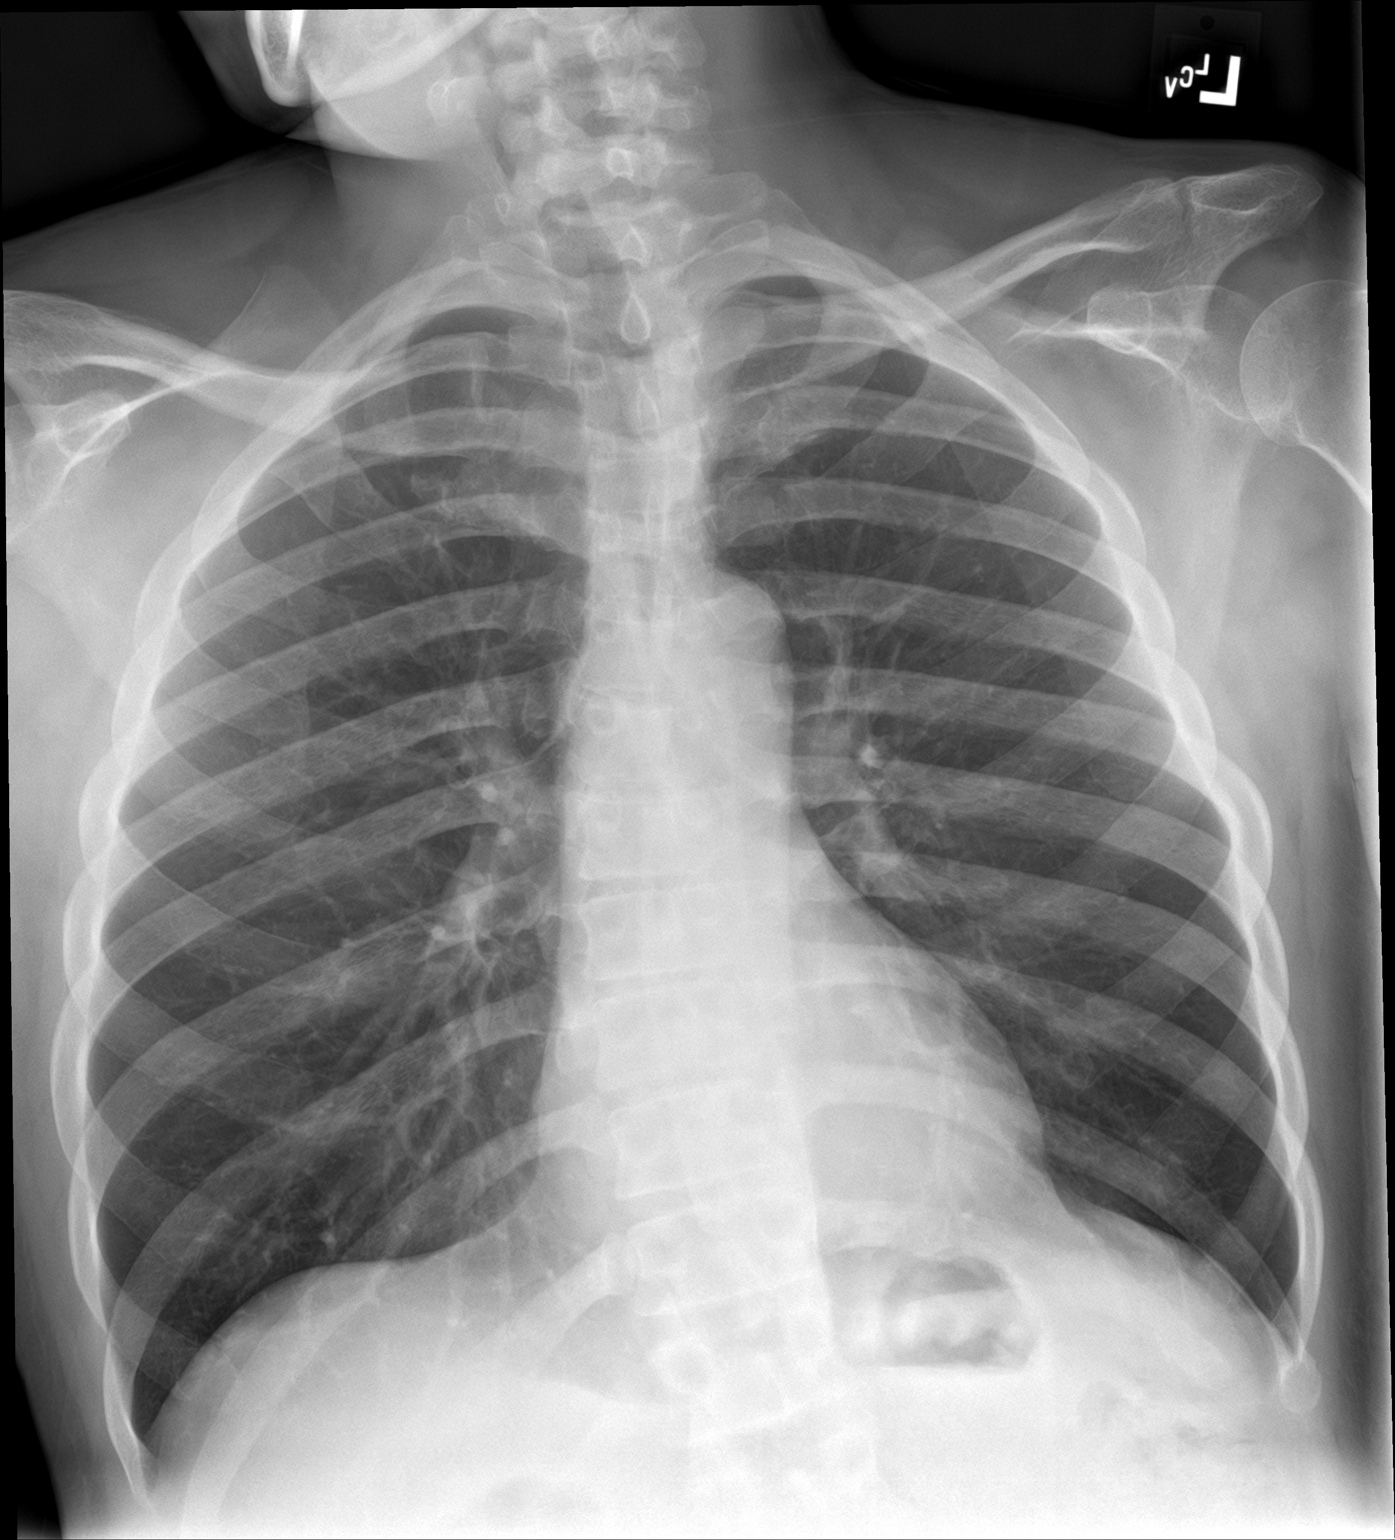

[rib pa]
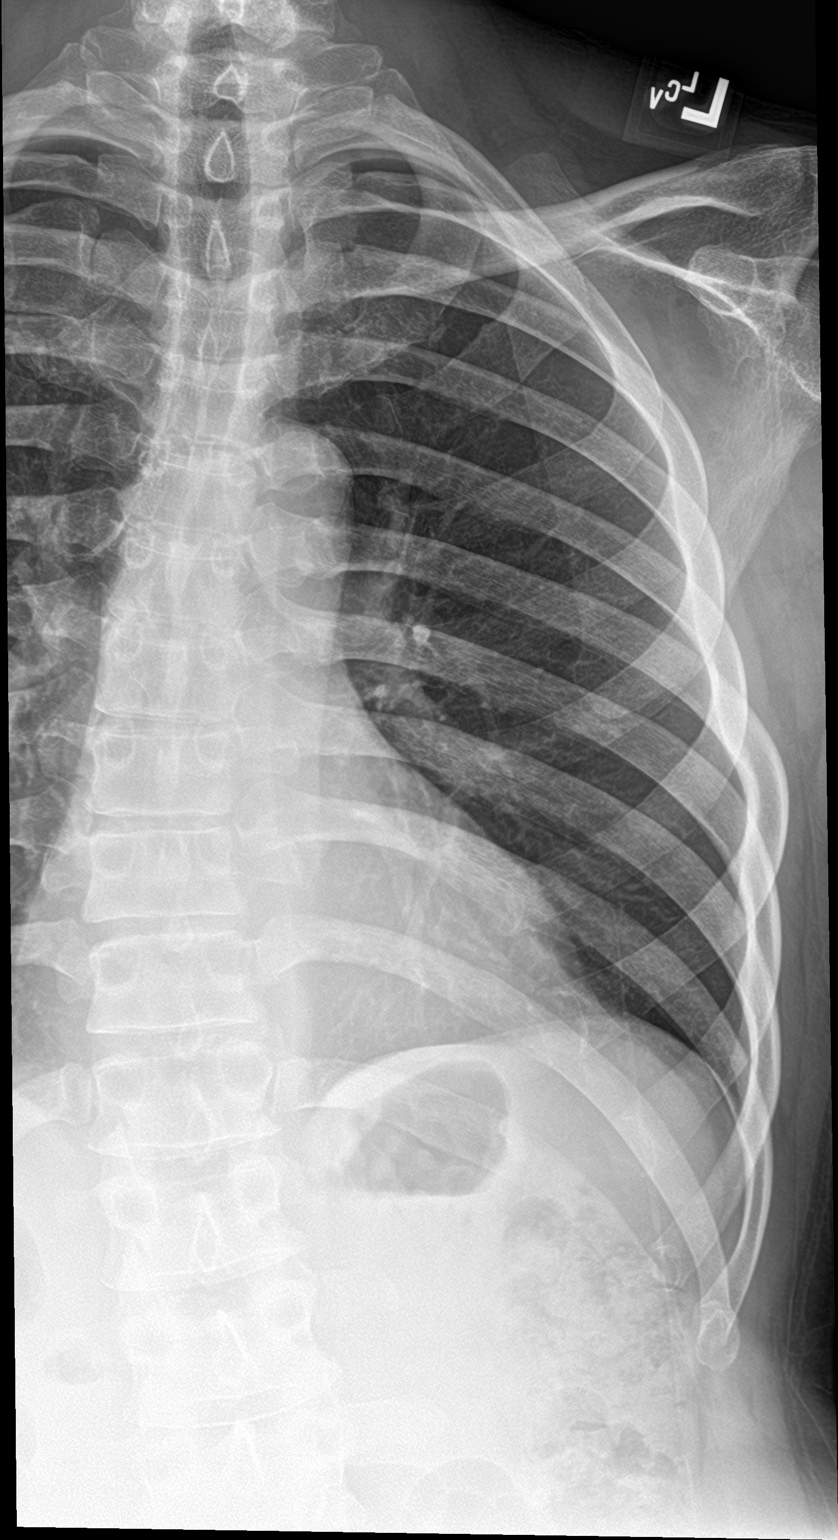

[rib pa obl]
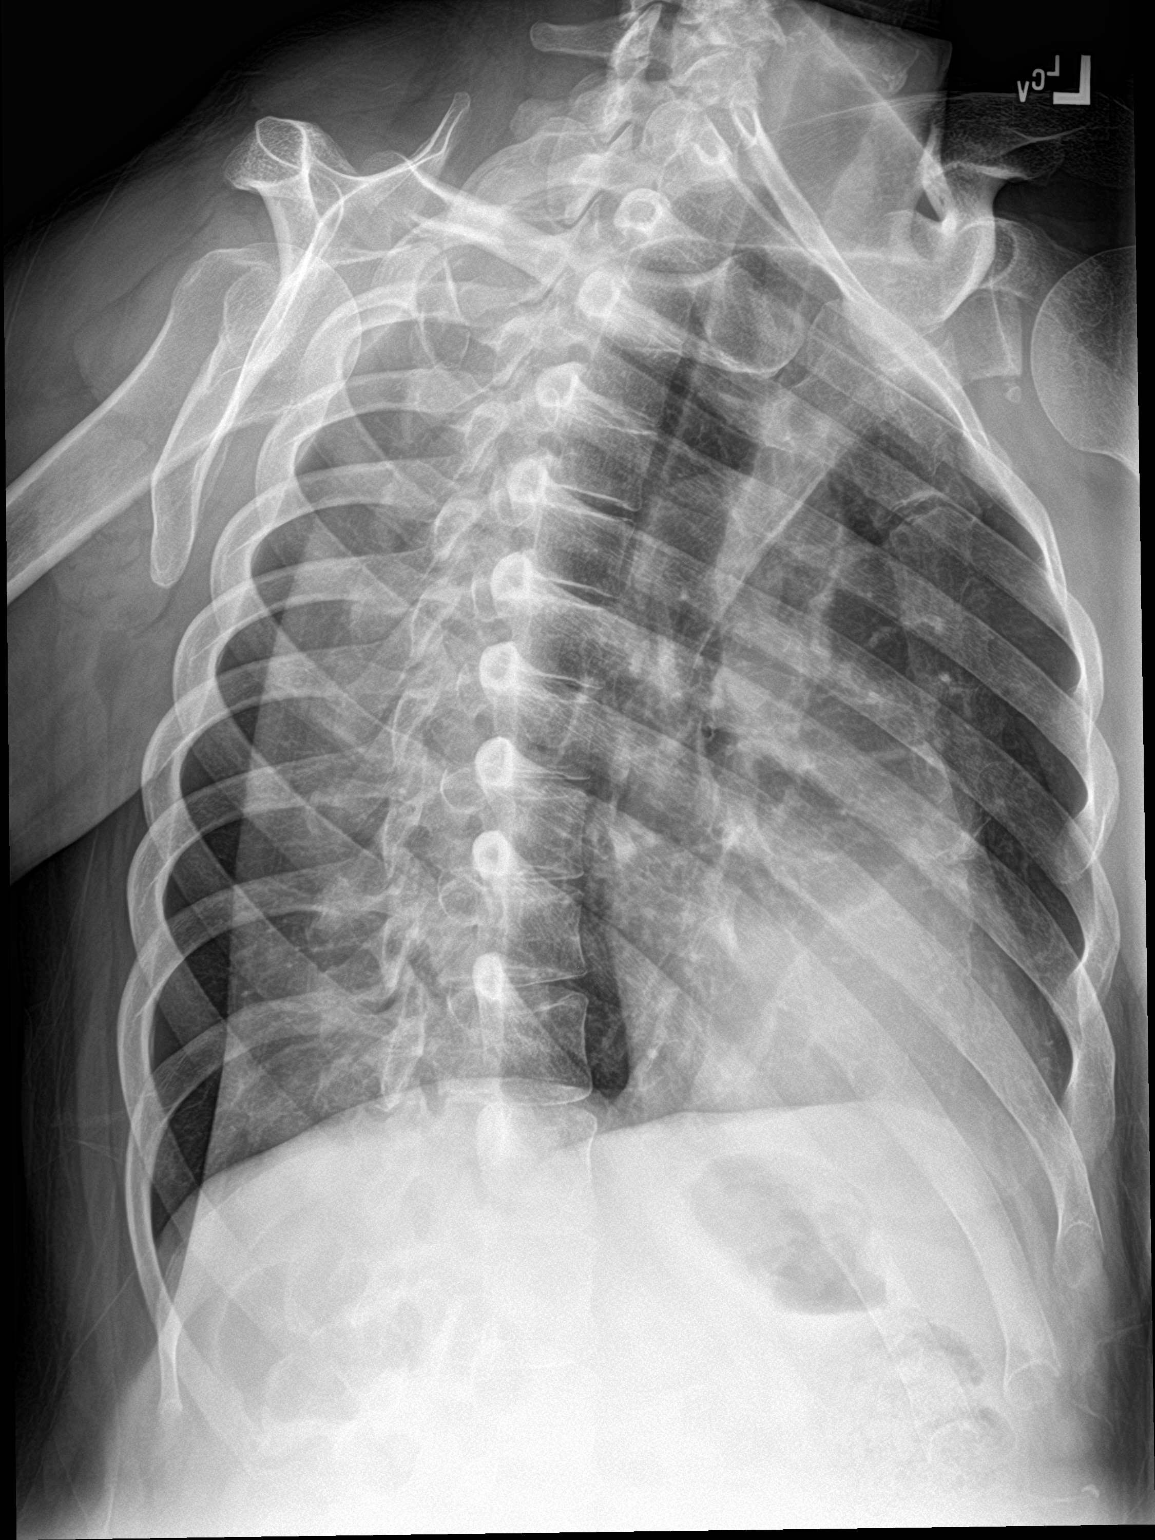

[3 of 3 positions shown; findings below may reference images not displayed]

FINDINGS: No fracture or other bone lesions are seen involving the ribs. There
is no evidence of pneumothorax or pleural effusion. Both lungs are
clear. Heart size and mediastinal contours are within normal limits.
IMPRESSION: Negative.

## 2020-02-28 IMAGING — CR DG HIP (WITH OR WITHOUT PELVIS) 2-3V*L*
3 series · 3 of 3 positions shown · non-contrast
Comparison: 12/11/2019

CLINICAL DATA: Fall 2 days ago with left hip pain.

EXAM:
DG HIP (WITH OR WITHOUT PELVIS) 2-3V LEFT

[pelvis ap]
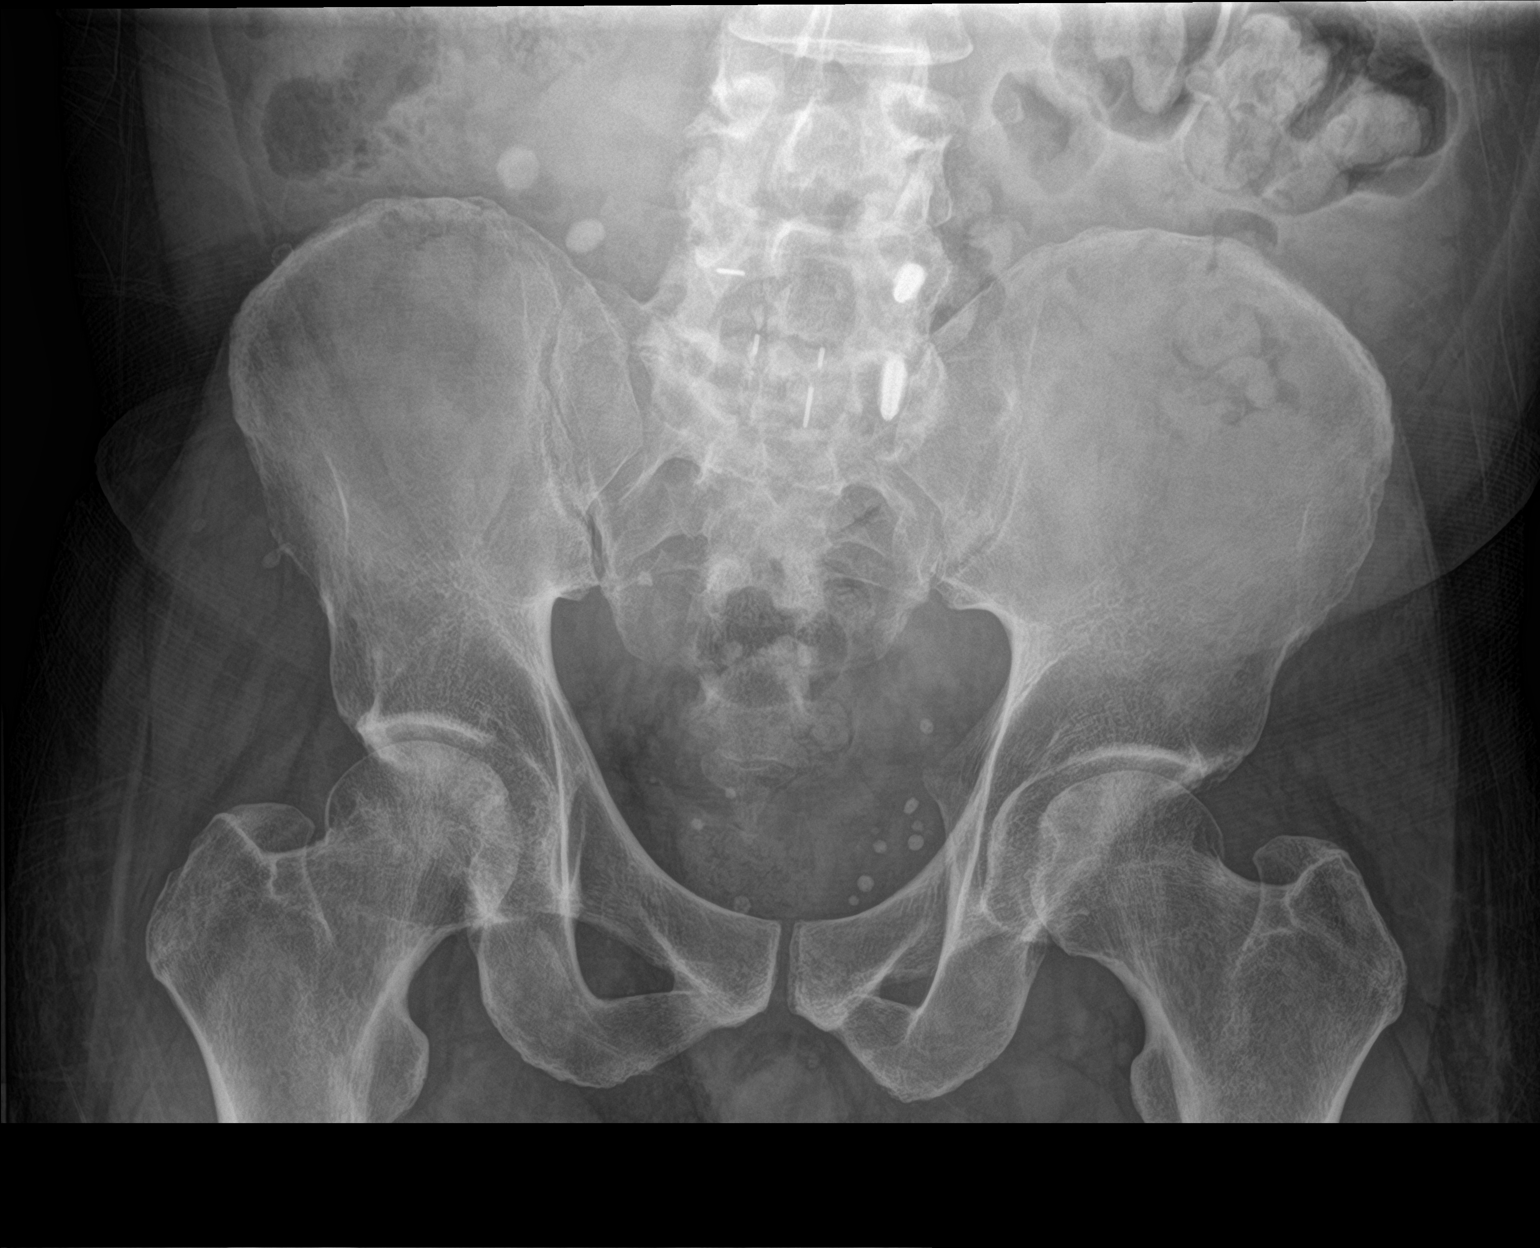

[hip ap]
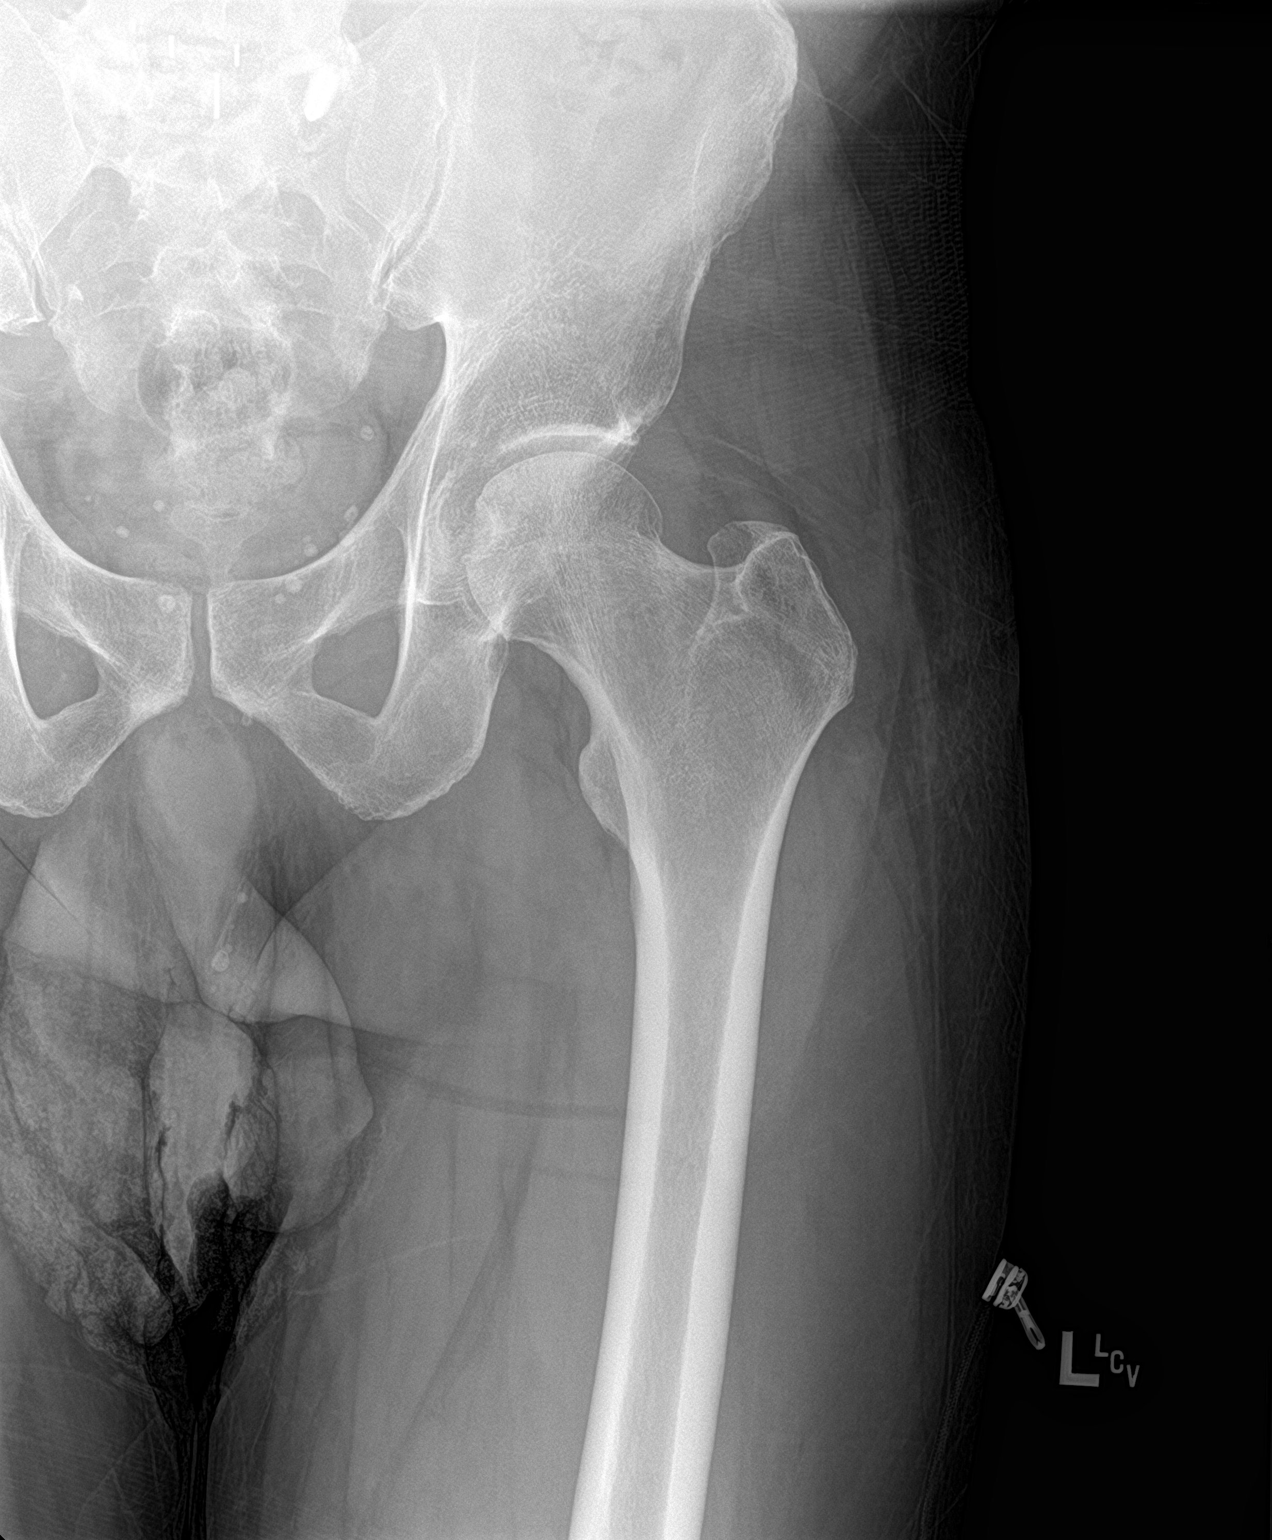

[hip lat]
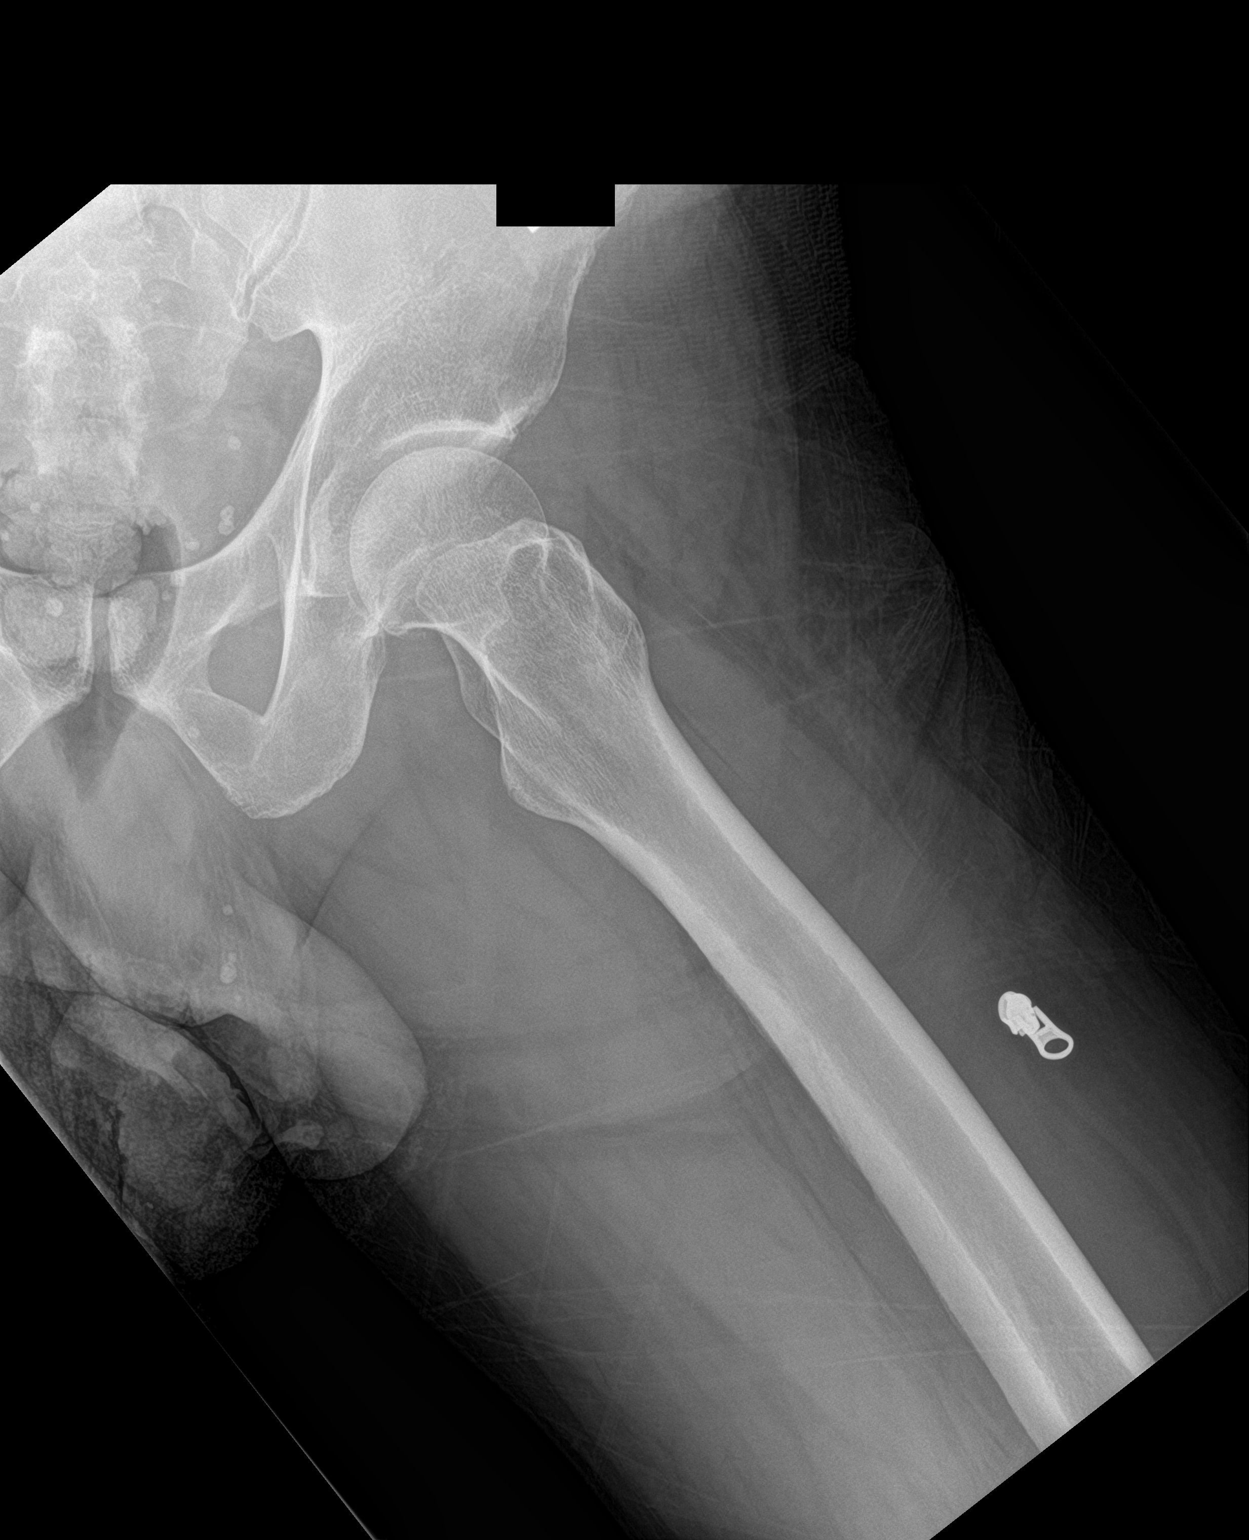

[3 of 3 positions shown; findings below may reference images not displayed]

FINDINGS: No evidence of acute fracture or dislocation. Remainder the exam is
unchanged.
IMPRESSION: No acute findings.

## 2020-03-02 ENCOUNTER — Ambulatory Visit: Payer: Medicare Other | Admitting: Physical Therapy

## 2020-03-04 ENCOUNTER — Ambulatory Visit: Payer: Medicare Other | Admitting: Physical Therapy

## 2020-03-06 ENCOUNTER — Other Ambulatory Visit: Payer: Self-pay | Admitting: Family Medicine

## 2020-03-06 DIAGNOSIS — E782 Mixed hyperlipidemia: Secondary | ICD-10-CM

## 2020-03-06 DIAGNOSIS — I6930 Unspecified sequelae of cerebral infarction: Secondary | ICD-10-CM

## 2020-03-06 MED ORDER — ATORVASTATIN CALCIUM 40 MG PO TABS: 40.0000 mg | ORAL_TABLET | Freq: Every evening | ORAL | 0 refills | Status: DC

## 2020-03-06 NOTE — Telephone Encounter (Signed)
Pt called requesting refill on calcium 40 mg

## 2020-03-10 ENCOUNTER — Ambulatory Visit: Payer: Medicare Other | Admitting: Physical Therapy

## 2020-03-12 ENCOUNTER — Ambulatory Visit: Payer: Medicare Other | Admitting: Physical Therapy

## 2020-04-19 ENCOUNTER — Other Ambulatory Visit: Payer: Self-pay | Admitting: Family Medicine

## 2020-04-19 DIAGNOSIS — I10 Essential (primary) hypertension: Secondary | ICD-10-CM

## 2020-04-19 NOTE — Telephone Encounter (Signed)
Requested Prescriptions  Pending Prescriptions Disp Refills  . amLODipine (NORVASC) 5 MG tablet [Pharmacy Med Name: AMLODIPINE BESYLATE 5 MG TAB] 90 tablet 1    Sig: TAKE 1 TABLET BY MOUTH EVERY DAY     Cardiovascular:  Calcium Channel Blockers Passed - 04/19/2020 10:04 AM      Passed - Last BP in normal range    BP Readings from Last 1 Encounters:  01/21/20 130/79         Passed - Valid encounter within last 6 months    Recent Outpatient Visits          2 months ago Spastic hemiplegia of left dominant side as late effect of cerebral infarction [facility])   [facility], Netta Neat, DO   5 months ago Spastic hemiplegia of left dominant side as late effect of cerebral infarction [facility])   [facility] Smitty Cords, DO   8 months ago Chronic pain syndrome   [facility], Netta Neat, DO   10 months ago Spastic hemiplegia of left dominant side as late effect of cerebral infarction [facility])   [facility] Smitty Cords, DO   11 months ago Right groin pain   [facility], Netta Neat, DO             . chlorthalidone (HYGROTON) 25 MG tablet [Pharmacy Med Name: CHLORTHALIDONE 25 MG TABLET] 90 tablet 1    Sig: TAKE 1 TABLET BY MOUTH EVERY DAY     Cardiovascular: Diuretics - Thiazide Passed - 04/19/2020 10:04 AM      Passed - Ca in normal range and within 360 days    Calcium  Date Value Ref Range Status  12/20/2019 8.9 8.9 - 10.3 mg/dL Final   Calcium, Total  Date Value Ref Range Status  01/12/2015 8.2 (L) 8.5 - 10.1 mg/dL Final         Passed - Cr in normal range and within 360 days    Creatinine  Date Value Ref Range Status  01/12/2015 0.97 0.60 - 1.30 mg/dL Final   Creatinine, Ser  Date Value Ref Range Status  12/20/2019 0.94 0.61 - 1.24 mg/dL Final         Passed - K in normal range and within 360 days    Potassium  Date Value Ref Range Status   12/20/2019 3.9 3.5 - 5.1 mmol/L Final  01/12/2015 4.0 3.5 - 5.1 mmol/L Final         Passed - Na in normal range and within 360 days    Sodium  Date Value Ref Range Status  12/20/2019 140 135 - 145 mmol/L Final  01/12/2015 138 136 - 145 mmol/L Final         Passed - Last BP in normal range    BP Readings from Last 1 Encounters:  01/21/20 130/79         Passed - Valid encounter within last 6 months    Recent Outpatient Visits          2 months ago Spastic hemiplegia of left dominant side as late effect of cerebral infarction [facility])   [facility] Smitty Cords, DO   5 months ago Spastic hemiplegia of left dominant side as late effect of cerebral infarction [facility])   [facility] Smitty Cords, DO   8 months ago Chronic pain syndrome   [facility], Netta Neat, DO  10 months ago Spastic hemiplegia of left dominant side as late effect of cerebral infarction [facility])   [place], [place]   11 months ago Right groin pain   [place], [place]

## 2020-04-21 ENCOUNTER — Other Ambulatory Visit: Payer: Self-pay | Admitting: Family Medicine

## 2020-04-21 DIAGNOSIS — I10 Essential (primary) hypertension: Secondary | ICD-10-CM

## 2020-04-21 DIAGNOSIS — I6930 Unspecified sequelae of cerebral infarction: Secondary | ICD-10-CM

## 2020-04-21 NOTE — Telephone Encounter (Signed)
Approved per protocol. Appointment scheduled for tomorrow.

## 2020-04-22 ENCOUNTER — Other Ambulatory Visit: Payer: Self-pay

## 2020-04-22 ENCOUNTER — Encounter: Payer: Self-pay | Admitting: Family Medicine

## 2020-04-22 ENCOUNTER — Ambulatory Visit (INDEPENDENT_AMBULATORY_CARE_PROVIDER_SITE_OTHER): Payer: Medicare Other | Admitting: Family Medicine

## 2020-04-22 VITALS — BP 130/88 | Ht 66.0 in | Wt 200.0 lb

## 2020-04-22 DIAGNOSIS — I6930 Unspecified sequelae of cerebral infarction: Secondary | ICD-10-CM | POA: Diagnosis not present

## 2020-04-22 DIAGNOSIS — I69352 Hemiplegia and hemiparesis following cerebral infarction affecting left dominant side: Secondary | ICD-10-CM | POA: Diagnosis not present

## 2020-04-22 NOTE — Progress Notes (Signed)
Virtual Visit via Telephone The purpose of this virtual visit is to provide medical care while limiting exposure to the novel coronavirus (COVID19) for both patient and office staff.  Consent was obtained for phone visit:  Yes.   Answered questions that patient had about telehealth interaction:  Yes.   I discussed the limitations, risks, security and privacy concerns of performing an evaluation and management service by telephone. I also discussed with the patient that there may be a patient responsible charge related to this service. The patient expressed understanding and agreed to proceed.  Patient Location: Home Provider Location: [facility])  ---------------------------------------------------------------------- Chief Complaint  Patient presents with  . Spastic Hemiplegia    needs power wheelchair    S: Reviewed CMA documentation. I have called patient and gathered additional HPI as follows:  Last year 05/2019, we completed the paperwork and he lost his phone, never followed up on it   Power Wheelchair Mobility Assessment  - Patient has difficulty with reaching different areas within his house such as kitchen and bathroom and difficulty getting out of house and mobility to and around stores, as he is limited in mobility, a power wheelchair would help him access different areas of his house more frequently and perform ADLs such as meal prep and cooking in kitchen and toileting and shopping at stores and getting to appointments.  - Unable to use cane or walker regularly due to LEFT upper extremity weakness causing lack of support for these devices, also weakness in LEFT lower extremity  - Unable to use manual wheelchair due to reduced upper and lower body strength and limited range of motion, due to weakness in Left upper extremity.  - Unable to use a scooter (power device) due to limited ability to transfer on and off scooter, due to muscle  weakness.   Denies any fevers, chills, sweats, body ache, cough, shortness of breath, sinus pain or pressure, headache, abdominal pain, diarrhea   Past Medical History:  Diagnosis Date  . Anemia   . Anxiety   . Chronic back pain   . COVID-19 06/2019   July 2020  . Depression   . Hemiplegia affecting left nondominant side (HCC)   . Hyperlipidemia   . Mood disorder (HCC)   . Personality disorder (HCC)   . Restless leg syndrome   . Sleep apnea   . Stroke [facility])    Social History   Tobacco Use  . Smoking status: Current Every Day Smoker    Packs/day: 0.50    Types: Cigarettes  . Smokeless tobacco: Current User  Substance Use Topics  . Alcohol use: No  . Drug use: No    Current Outpatient Medications:  .  amLODipine (NORVASC) 5 MG tablet, TAKE 1 TABLET BY MOUTH EVERY DAY, Disp: 90 tablet, Rfl: 1 .  atorvastatin (LIPITOR) 40 MG tablet, Take 1 tablet (40 mg total) by mouth every evening., Disp: 90 tablet, Rfl: 0 .  baclofen (LIORESAL) 20 MG tablet, Take 20 mg by mouth as needed. , Disp: , Rfl:  .  chlorthalidone (HYGROTON) 25 MG tablet, TAKE 1 TABLET BY MOUTH EVERY DAY, Disp: 90 tablet, Rfl: 1 .  clopidogrel (PLAVIX) 75 MG tablet, TAKE 1 TABLET BY MOUTH EVERY DAY, Disp: 90 tablet, Rfl: 0 .  losartan (COZAAR) 100 MG tablet, TAKE 1 TABLET BY MOUTH EVERY DAY, Disp: 90 tablet, Rfl: 0 .  meclizine (ANTIVERT) 25 MG tablet, Take 1 tablet (25 mg total) by mouth 3 (three) times daily  as needed for dizziness., Disp: 90 tablet, Rfl: 1 .  methocarbamol (ROBAXIN) 500 MG tablet, Take 1 tablet (500 mg total) by mouth every 6 (six) hours as needed for muscle spasms., Disp: 120 tablet, Rfl: 2 .  ondansetron (ZOFRAN ODT) 4 MG disintegrating tablet, Take 1 tablet (4 mg total) by mouth every 8 (eight) hours as needed., Disp: 20 tablet, Rfl: 0 .  oxyCODONE-acetaminophen (PERCOCET) 7.5-325 MG tablet, Take 1 tablet by mouth 3 (three) times daily as needed for severe pain., Disp: , Rfl:  .  potassium  chloride (KLOR-CON) 10 MEQ tablet, TAKE 1 TABLET (10 MEQ TOTAL) BY MOUTH 2 (TWO) TIMES DAILY., Disp: 180 tablet, Rfl: 0 .  pregabalin (LYRICA) 300 MG capsule, Take 300 mg by mouth 2 (two) times daily. , Disp: , Rfl:  .  lidocaine (LIDODERM) 5 %, Place 1 patch onto the skin every 12 (twelve) hours. Remove & Discard patch within 12 hours or as directed by MD (Patient not taking: Reported on 04/22/2020), Disp: 10 patch, Rfl: 0 .  oxyCODONE-acetaminophen (PERCOCET) 7.5-325 MG tablet, oxycodone-acetaminophen 7.5 mg-325 mg tablet  TAKE 1 TABLET BY MOUTH EVERY 8 HOURS AS NEEDED PAIN. DO NOT FILL BEFORE 04/29/2019, Disp: , Rfl:   Depression screen [facility] 2/9 04/22/2020 01/21/2020 10/23/2019  Decreased Interest 0 0 1  Down, Depressed, Hopeless 0 0 3  PHQ - 2 Score 0 0 4  Altered sleeping - - 1  Tired, decreased energy - - 1  Change in appetite - - 1  Feeling bad or failure about yourself  - - 1  Trouble concentrating - - 1  Moving slowly or fidgety/restless - - 1  Suicidal thoughts - - 1  PHQ-9 Score - - 11  Difficult doing work/chores - - Somewhat difficult    GAD 7 : Generalized Anxiety Score 10/23/2019  Nervous, Anxious, on Edge 1  Control/stop worrying 1  Worry too much - different things 1  Trouble relaxing 1  Restless 1  Easily annoyed or irritable 1  Afraid - awful might happen 1  Total GAD 7 Score 7  Anxiety Difficulty Somewhat difficult    -------------------------------------------------------------------------- O: No physical exam performed due to remote telephone encounter.  Lab results reviewed.  No results found for this or any previous visit (from the past 2160 hour(s)).   No physical exam performed due to remote telephone encounter.  References for documented physical exam findings were from previous recent office visit note on file.  Patient is wheelchair bound. Chronically ill appearing but currently well  RIGHT Upper Extremity - Muscle Strength: intact 5/5 grip,  slightly reduced 5/5 both biceps, triceps - Range of Motion: intact  LEFT Upper Extremity - Muscle Strength: reduced 3/5 grip, slightly reduced 4/5 both biceps, triceps - Range of Motion: Limited forward flexion and abduction, range behind back or internal rotation of shoulders  RIGHT Lower Extremity - Muscle Strength: intact 5/5 hip/knee flexion extension - Range of Motion: intact flexion, extension, rotation hip, knee  LEFT Lower Extremity - Muscle Strength: reduced 4/5 hip flexion and knee flexion extension - Range of Motion: mildly reduced left hip flexion and extension, mostly intact left knee flex/extend, slightly reduced left ankle dorsi/plantar flexion  -------------------------------------------------------------------------- A&P:  Problem List Items Addressed This Visit    Spastic hemiplegia of left dominant side as late effect of cerebral infarction (HCC) - Primary    Other Visit Diagnoses    History of cerebrovascular accident (CVA) with residual deficit  1. Change to now require power mobility device (PMD) - Persistent Left sided residual weakness following prior CVA/Stroke, with Left Upper Extremity > lower extremity  Hemiplegia, limiting his ability to manually operate a manual wheelchair or other assistance devices for mobility.  2. Height = 5\' 6" , Weight = 200 lbs  3. - O2 Saturation =Unavailable data. - Edema = Negative - Pressure Sores = None actively - Ability to Shift Weight = Able to shift weight, but unable to transfer due to lack of strength, Left side weakness  4. Medical Exam - Cardiac - Known history of CVA. Hemodynamically stable with history of Hypertension. - Pulmonary - No significant history. - Musculoskeletal - Chronic pain syndrome in multiple areas and joints - Neurological - S/p Left hemiparesis, upper extremity weakness compared to lower extremity - Ambulatory Exam - Unable to be performed. Patient is non-ambulatory s/p L  sided hemiplegia weakness.  5. Upper and Lower Extremity Assessment See above documentation  RUE - Strength - 5 out of 5 - Pain - 2 out of 10 - Range of Motion - Mostly full range  LUE - Strength - 3 out of 5 - Pain - 7 out of 10 - Range of Motion - limited range  RLE - Strength 5 out of 5 - Pain - 2 out of 10 - Range of Motion - mostly full range  LLE - Strength - 4 out of 5 - limited - Pain - 7 out of 10 - Range of Motion - limited  - Gait Pattern: Unable to assess. Non ambulatory. S/p L-hemiplegia  ----------------------------------------------------  Medical Conditions: - The primary medical condition that impacts patient's mobility need is his status of spastic hemiplegia left sided secondary to CVA residual deficit, affecting Left Upper extremity primarily and also more mildly Left lower extremity. - Additional medical conditions contributing to his mobility, include chronic pain syndrome, hypertension, OSA   MRADLsimpaired in the home include: - PMD is necessary for patient's mobility to get to the bathroom for routine toilet use. - PMD is necessary for patient's mobility to get to the kitchen to prepare meals - PMD is necessary for patient's mobility to get to the bedroom to dress and sleep  Cane or Walker Patient cannot use a cane / walker due to his status of Left Hemiplegia with weakness, as well as generalized weakness limiting his ability to properly and safely use these devices. His weakness as listed above with LUE 3 / 5 and LLE 4 / 5  Manual Wheelchair Patient cannot use MWC due to generalized weakness limiting his ability to properly and safely use this device to propel movement. His weakness as listed above with LUE 3 / 5, and LLE 4 / 5  Scooter (POV) Patient cannot use a POV due to lack of postural stability, with generalized weakness in upper and lower extremities.  Patient can safely operate the power mobility device physically. He is  mentally capable to operate the device.  Patient is willing and motivated to use the power mobility device in his home to improve his quality of life.  Will fax records and order request to [facility].   No orders of the defined types were placed in this encounter.   Follow-up: PRN  Patient verbalizes understanding with the above medical recommendations including the limitation of remote medical advice.  Specific follow-up and call-back criteria were given for patient to follow-up or seek medical care more urgently if needed.   - Time spent in direct consultation with  patient on phone: 12 minutes   Saralyn Pilar, DO [facility] 04/22/2020, 10:16 AM

## 2020-05-06 ENCOUNTER — Other Ambulatory Visit: Payer: Self-pay | Admitting: Family Medicine

## 2020-05-06 DIAGNOSIS — I6930 Unspecified sequelae of cerebral infarction: Secondary | ICD-10-CM

## 2020-05-12 ENCOUNTER — Other Ambulatory Visit: Payer: Self-pay | Admitting: Family Medicine

## 2020-05-12 DIAGNOSIS — I10 Essential (primary) hypertension: Secondary | ICD-10-CM

## 2020-05-21 ENCOUNTER — Other Ambulatory Visit: Payer: Self-pay | Admitting: Family Medicine

## 2020-05-21 DIAGNOSIS — I10 Essential (primary) hypertension: Secondary | ICD-10-CM

## 2020-06-10 ENCOUNTER — Other Ambulatory Visit: Payer: Self-pay | Admitting: Family Medicine

## 2020-06-10 DIAGNOSIS — I10 Essential (primary) hypertension: Secondary | ICD-10-CM

## 2020-06-10 MED ORDER — POTASSIUM CHLORIDE ER 10 MEQ PO TBCR: 10.0000 meq | EXTENDED_RELEASE_TABLET | Freq: Two times a day (BID) | ORAL | 0 refills | Status: DC

## 2020-06-11 ENCOUNTER — Other Ambulatory Visit: Payer: Self-pay | Admitting: Family Medicine

## 2020-06-11 DIAGNOSIS — I10 Essential (primary) hypertension: Secondary | ICD-10-CM

## 2020-06-11 MED ORDER — LOSARTAN POTASSIUM 100 MG PO TABS: 100.0000 mg | ORAL_TABLET | Freq: Every day | ORAL | 0 refills | Status: DC

## 2020-06-15 ENCOUNTER — Ambulatory Visit: Payer: Medicare Other | Admitting: Family Medicine

## 2020-06-16 ENCOUNTER — Telehealth: Payer: Self-pay

## 2020-06-16 NOTE — Telephone Encounter (Signed)
Copied from CRM [id number]. Topic: General - Inquiry >> Jun 16, 2020 10:48 AM Leary Roca wrote: Reason for CRM: Pt states [facility] needs an e-mail or a written letter stating pt is eligible to receive nurse aid . Pt states he answered some of the questions incorrectly .  Dr Kirtland Bouchard . Please advise   [facility] [phone]

## 2020-06-18 NOTE — Telephone Encounter (Signed)
Ok. I can write a letter saying he is appropriate for a nurse aid.  Would you be able to call [facility] back to clarify where to send the letter and if there is any other information or diagnosis or anything else that we need to include? I can write it likely on Friday and we can fax it.  Saralyn Pilar, DO [facility] 06/18/2020, 1:33 PM

## 2020-06-18 NOTE — Telephone Encounter (Signed)
Thank you for calling. Sounds good. Looks like he re-scheduled to 7/6. Hopefully he can come in person and we can complete the forms.  Saralyn Pilar, DO [facility] 06/18/2020, 5:28 PM

## 2020-06-18 NOTE — Telephone Encounter (Signed)
Spoke to [facility] --as long as pt has visit within 90 days they will fax the forms for the provider to fill out once get it will live in Dr. Charm Rings box.

## 2020-06-30 ENCOUNTER — Ambulatory Visit: Payer: Self-pay | Admitting: Family Medicine

## 2020-07-01 ENCOUNTER — Other Ambulatory Visit: Payer: Self-pay | Admitting: Family Medicine

## 2020-07-01 DIAGNOSIS — I69352 Hemiplegia and hemiparesis following cerebral infarction affecting left dominant side: Secondary | ICD-10-CM

## 2020-07-01 NOTE — Telephone Encounter (Signed)
Medication Refill - Medication: methocarbamol (ROBAXIN) 500 MG tablet (Patient is requesting a callback once medication has been sent to pharmacy.)   Has the patient contacted their pharmacy? yes (Agent: If no, request that the patient contact the pharmacy for the refill.) (Agent: If yes, when and what did the pharmacy advise?)Contact PCP  Preferred Pharmacy (with phone number or street name):  [id number] - [place], [place] - [address] Phone:  [phone]  Fax:  [phone]       Agent: Please be advised that RX refills may take up to 3 business days. We ask that you follow-up with your pharmacy.

## 2020-07-02 MED ORDER — METHOCARBAMOL 500 MG PO TABS: 500.0000 mg | ORAL_TABLET | Freq: Four times a day (QID) | ORAL | 0 refills | Status: DC | PRN

## 2020-07-02 NOTE — Telephone Encounter (Signed)
Patient checking on the status of muscle relaxer medication stating the pain has not improved, patient would like a follow up call today.

## 2020-07-02 NOTE — Telephone Encounter (Signed)
Signed order pended for Methocarbamol. #120 pills, 0 refills.  He has no showed last 2 appointments. He has rescheduled now for next week.  I sent 1 month supply, he can follow up next week for further refills.  Saralyn Pilar, DO [facility] 07/02/2020, 2:27 PM

## 2020-07-05 ENCOUNTER — Emergency Department
Admission: EM | Admit: 2020-07-05 | Discharge: 2020-07-05 | Disposition: A | Discharge status: Home / Self Care | Payer: Medicare Other | Attending: Emergency Medicine | Admitting: Emergency Medicine

## 2020-07-05 ENCOUNTER — Emergency Department: Payer: Medicare Other

## 2020-07-05 ENCOUNTER — Other Ambulatory Visit: Payer: Self-pay

## 2020-07-05 ENCOUNTER — Encounter: Payer: Self-pay | Admitting: Emergency Medicine

## 2020-07-05 DIAGNOSIS — S40012A Contusion of left shoulder, initial encounter: Secondary | ICD-10-CM | POA: Insufficient documentation

## 2020-07-05 DIAGNOSIS — Y939 Activity, unspecified: Secondary | ICD-10-CM | POA: Insufficient documentation

## 2020-07-05 DIAGNOSIS — Z79899 Other long term (current) drug therapy: Secondary | ICD-10-CM | POA: Insufficient documentation

## 2020-07-05 DIAGNOSIS — I10 Essential (primary) hypertension: Secondary | ICD-10-CM | POA: Insufficient documentation

## 2020-07-05 DIAGNOSIS — Z7902 Long term (current) use of antithrombotics/antiplatelets: Secondary | ICD-10-CM | POA: Insufficient documentation

## 2020-07-05 DIAGNOSIS — S4992XA Unspecified injury of left shoulder and upper arm, initial encounter: Secondary | ICD-10-CM | POA: Diagnosis present

## 2020-07-05 DIAGNOSIS — W19XXXA Unspecified fall, initial encounter: Secondary | ICD-10-CM | POA: Insufficient documentation

## 2020-07-05 DIAGNOSIS — Y999 Unspecified external cause status: Secondary | ICD-10-CM | POA: Diagnosis not present

## 2020-07-05 DIAGNOSIS — Y92009 Unspecified place in unspecified non-institutional (private) residence as the place of occurrence of the external cause: Secondary | ICD-10-CM | POA: Diagnosis not present

## 2020-07-05 MED ORDER — OXYCODONE-ACETAMINOPHEN 5-325 MG PO TABS
1.0000 | ORAL_TABLET | Freq: Once | ORAL | Status: AC
  Administered 2020-07-05: 1 via ORAL
  Filled 2020-07-05: qty 1

## 2020-07-05 NOTE — ED Provider Notes (Signed)
[facility] Emergency Department Provider Note   ____________________________________________   First MD Initiated Contact with Patient 07/05/20 [phone]     (approximate)  I have reviewed the triage vital signs and the nursing notes.   HISTORY  Chief Complaint Fall and Shoulder Pain    HPI Mark Wells is a 49 y.o. male presents to the ED with complaint of left shoulder pain via EMS.  Patient states that around midnight last night he fell on the porch of his parents house injuring his left shoulder.  He denies any head injury or loss of consciousness.  Patient states that he falls secondary to his history of CVA.  He also states that he has bilateral foot pain which is chronic and he has seen his primary care provider for this.  He rates his pain as a 10/10.       Past Medical History:  Diagnosis Date  . Anemia   . Anxiety   . Chronic back pain   . COVID-19 06/2019   July 2020  . Depression   . Hemiplegia affecting left nondominant side (HCC)   . Hyperlipidemia   . Mood disorder (HCC)   . Personality disorder (HCC)   . Restless leg syndrome   . Sleep apnea   . Stroke [facility])     Patient Active Problem List   Diagnosis Date Noted  . OSA on CPAP 12/09/2018  . Chronic pain syndrome 12/09/2018  . Major depression, recurrent, chronic (HCC) 12/09/2018  . Insomnia 12/09/2018  . Spastic hemiplegia of left dominant side as late effect of cerebral infarction (HCC) 12/07/2018  . Painful orthopaedic hardware (HCC) 09/03/2018  . BACK PAIN 01/07/2011  . ADVERSE DRUG REACTION 01/01/2011  . Essential hypertension 12/28/2010  . LOW BACK PAIN SYNDROME, SEVERE 12/28/2010  . OTHER URINARY INCONTINENCE 12/28/2010    Past Surgical History:  Procedure Laterality Date  . BACK SURGERY      Prior to Admission medications   Medication Sig Start Date End Date Taking? Authorizing Provider  amLODipine (NORVASC) 5 MG tablet TAKE 1 TABLET BY MOUTH EVERY DAY  04/19/20   Karamalegos, Netta Neat, DO  atorvastatin (LIPITOR) 40 MG tablet Take 1 tablet (40 mg total) by mouth every evening. 03/06/20   Karamalegos, Netta Neat, DO  baclofen (LIORESAL) 20 MG tablet Take 20 mg by mouth as needed.  03/21/19   [provider]  chlorthalidone (HYGROTON) 25 MG tablet TAKE 1 TABLET BY MOUTH EVERY DAY 04/19/20   Althea Charon, Netta Neat, DO  clopidogrel (PLAVIX) 75 MG tablet TAKE 1 TABLET BY MOUTH EVERY DAY 05/06/20   Karamalegos, Netta Neat, DO  losartan (COZAAR) 100 MG tablet Take 1 tablet (100 mg total) by mouth daily. 06/11/20   Karamalegos, Netta Neat, DO  meclizine (ANTIVERT) 25 MG tablet Take 1 tablet (25 mg total) by mouth 3 (three) times daily as needed for dizziness. 02/26/19   Karamalegos, Netta Neat, DO  methocarbamol (ROBAXIN) 500 MG tablet Take 1 tablet (500 mg total) by mouth every 6 (six) hours as needed for muscle spasms. 07/02/20   Karamalegos, Netta Neat, DO  oxyCODONE-acetaminophen (PERCOCET) 7.5-325 MG tablet Take 1 tablet by mouth 3 (three) times daily as needed for severe pain.    [provider]  oxyCODONE-acetaminophen (PERCOCET) 7.5-325 MG tablet oxycodone-acetaminophen 7.5 mg-325 mg tablet  TAKE 1 TABLET BY MOUTH EVERY 8 HOURS AS NEEDED PAIN. DO NOT FILL BEFORE 04/29/2019    [provider]  potassium chloride (KLOR-CON) 10 MEQ tablet  Take 1 tablet (10 mEq total) by mouth 2 (two) times daily. 06/10/20   Karamalegos, Netta Neat, DO  pregabalin (LYRICA) 300 MG capsule Take 300 mg by mouth 2 (two) times daily.     [provider]    Allergies Aspirin, Ibuprofen, and Shellfish allergy  History reviewed. No pertinent family history.  Social History Social History   Tobacco Use  . Smoking status: Current Every Day Smoker    Packs/day: 0.50    Types: Cigarettes  . Smokeless tobacco: Current User  Vaping Use  . Vaping Use: Never used  Substance Use Topics  . Alcohol use: No  . Drug use: No    Review of  Systems Constitutional: No fever/chills Eyes: No visual changes. ENT: No sore throat. Cardiovascular: Denies chest pain. Respiratory: Denies shortness of breath. Gastrointestinal: No abdominal pain.  No nausea, no vomiting.  No diarrhea.   Genitourinary: Negative for dysuria. Musculoskeletal: Positive for left shoulder pain.  Positive for chronic bilateral foot pain. Skin: Negative for rash. Neurological: Negative for headaches, focal weakness or numbness. ____________________________________________   PHYSICAL EXAM:  VITAL SIGNS: ED Triage Vitals  Enc Vitals Group     BP 07/05/20 0828 107/75     Pulse Rate 07/05/20 0828 74     Resp 07/05/20 0828 16     Temp 07/05/20 0828 98.9 F (37.2 C)     Temp Source 07/05/20 0828 Oral     SpO2 07/05/20 0828 97 %     Weight 07/05/20 0830 171 lb (77.6 kg)     Height 07/05/20 0830 5\' 6"  (1.676 m)     Head Circumference --      Peak Flow --      Pain Score 07/05/20 0830 10     Pain Loc --      Pain Edu? --      Excl. in GC? --     Constitutional: Alert and oriented. Well appearing and in no acute distress. Eyes: Conjunctivae are normal. PERRL. EOMI. Head: Atraumatic. Nose: No congestion/rhinnorhea. Neck: No stridor.  No tenderness on palpation posteriorly. Cardiovascular: Normal rate, regular rhythm. Grossly normal heart sounds.  Good peripheral circulation. Respiratory: Normal respiratory effort.  No retractions. Lungs CTAB. Gastrointestinal: Soft and nontender. No distention. Musculoskeletal: There is generalized tenderness on palpation of the left shoulder both anterior and posterior without obvious deformity and no soft tissue edema present.  No discoloration is present.  Range of motion is decreased secondary to patient's pain.  No crepitus was appreciated with limited range of motion.  Pulses present distally and no tenderness is noted on palpation of the elbow.  Denies any acute injury on other extremities. Neurologic:  Normal  speech and language. No gross focal neurologic deficits are appreciated.  Skin:  Skin is warm, dry and intact.  No abrasions or discoloration noted. Psychiatric: Mood and affect are normal. Speech and behavior are normal.  ____________________________________________   LABS (all labs ordered are listed, but only abnormal results are displayed)  Labs Reviewed - No data to display  RADIOLOGY   Official radiology report(s): DG Shoulder Left  Result Date: 07/05/2020 CLINICAL DATA:  Pain post fall EXAM: LEFT SHOULDER - 2+ VIEW COMPARISON:  None. FINDINGS: There is no evidence of fracture or dislocation. There is no evidence of arthropathy or other focal bone abnormality. Soft tissues are unremarkable. IMPRESSION: Negative. Electronically Signed   By: 09/05/2020 M.D.   On: 07/05/2020 09:30    ____________________________________________   PROCEDURES  Procedure(s) performed (  including Critical Care):  Procedures   ____________________________________________   INITIAL IMPRESSION / ASSESSMENT AND PLAN / ED COURSE  As part of my medical decision making, I reviewed the following data within the electronic MEDICAL RECORD NUMBER Notes from prior ED visits and  Controlled Substance Database  49 year old male presents to the ED with complaint of left shoulder pain.  He states that he fell on his parents porch at approximately midnight last evening.  He denies any head injury or loss of consciousness.  He has continued to have pain in his shoulder this morning.  He denies any other injury.  He has a history of multiple falls due to history of CVA.  Controlled substance database shows that patient gets oxycodone 7.5/acetaminophen # 90 pills on a regular 30-day cycle.  Patient states he did not take one of his pills this morning.  X-ray of his left shoulder was negative for any acute bony injury.  Patient was made aware.  He was given 1 oxycodone while in the emergency department.  Patient is  encouraged to follow-up with his primary care provider if any continued problems.  He will continue taking his pain medication at home as directed.  Family was here to take him home.  ____________________________________________   FINAL CLINICAL IMPRESSION(S) / ED DIAGNOSES  Final diagnoses:  Contusion of left shoulder, initial encounter  Fall in home, initial encounter     ED Discharge Orders    None       Note:  This document was prepared using Dragon voice recognition software and may include unintentional dictation errors.    Tommi Rumps, PA-C 07/05/20 1109    Arnaldo Natal, MD 07/05/20 1441

## 2020-07-05 NOTE — Discharge Instructions (Signed)
Call Monday and make an appointment with your primary care provider in [place].  Continue with your pain medication at home.  You may ice your shoulder as needed for discomfort.

## 2020-07-05 NOTE — ED Triage Notes (Signed)
Pt presents to ED via [facility] with c/o fall last night at approx midnight. Per EMS pt c/o L shoulder and bilateral foot pain. Per EMS pt denies head injury, denies LOC, per EMS no deformity noted to L shoulder.    102/52 81 99% RA.

## 2020-07-05 NOTE — ED Notes (Signed)
Pt with c/o of left shoulder pain. Pt unable to lift arm but pt states left side deficit due to hx of stroke.

## 2020-07-05 NOTE — ED Notes (Signed)
Pt verbalized understanding of discharge instructions. NAD at this time. 

## 2020-07-08 ENCOUNTER — Other Ambulatory Visit: Payer: Self-pay | Admitting: Family Medicine

## 2020-07-08 DIAGNOSIS — I69352 Hemiplegia and hemiparesis following cerebral infarction affecting left dominant side: Secondary | ICD-10-CM

## 2020-07-08 NOTE — Telephone Encounter (Signed)
Requested medication (s) are due for refill today: no  Requested medication (s) are on the active medication list: yes  Last refill:  7/8//21 #120  Future visit scheduled: yes  Notes to clinic:  Please review for refill. Not delegated per protocol    Requested Prescriptions  Pending Prescriptions Disp Refills   methocarbamol (ROBAXIN) 500 MG tablet [Pharmacy Med Name: METHOCARBAMOL 500 MG TABLET] 120 tablet 0    Sig: Take 1 tablet (500 mg total) by mouth every 6 (six) hours as needed for muscle spasms.      Not Delegated - Analgesics:  Muscle Relaxants Failed - 07/08/2020 11:42 AM      Failed - This refill cannot be delegated      Passed - Valid encounter within last 6 months    Recent Outpatient Visits           2 months ago Spastic hemiplegia of left dominant side as late effect of cerebral infarction [facility])   [facility], Netta Neat, DO   5 months ago Spastic hemiplegia of left dominant side as late effect of cerebral infarction [facility])   [facility], Netta Neat, DO   8 months ago Spastic hemiplegia of left dominant side as late effect of cerebral infarction [facility])   [facility] Smitty Cords, DO   10 months ago Chronic pain syndrome   [facility], Netta Neat, DO   1 year ago Spastic hemiplegia of left dominant side as late effect of cerebral infarction [facility])   [facility], Netta Neat, DO       Future Appointments             Tomorrow Smitty Cords, DO [facility], [facility]

## 2020-07-09 ENCOUNTER — Encounter: Payer: Self-pay | Admitting: Family Medicine

## 2020-07-09 ENCOUNTER — Other Ambulatory Visit: Payer: Self-pay

## 2020-07-09 ENCOUNTER — Ambulatory Visit (INDEPENDENT_AMBULATORY_CARE_PROVIDER_SITE_OTHER): Payer: Medicare Other | Admitting: Family Medicine

## 2020-07-09 VITALS — BP 94/66 | HR 83 | Temp 97.1°F | Ht 66.0 in

## 2020-07-09 DIAGNOSIS — G4701 Insomnia due to medical condition: Secondary | ICD-10-CM

## 2020-07-09 DIAGNOSIS — M545 Low back pain, unspecified: Secondary | ICD-10-CM

## 2020-07-09 DIAGNOSIS — M5126 Other intervertebral disc displacement, lumbar region: Secondary | ICD-10-CM | POA: Diagnosis not present

## 2020-07-09 DIAGNOSIS — I69352 Hemiplegia and hemiparesis following cerebral infarction affecting left dominant side: Secondary | ICD-10-CM

## 2020-07-09 DIAGNOSIS — M47816 Spondylosis without myelopathy or radiculopathy, lumbar region: Secondary | ICD-10-CM

## 2020-07-09 DIAGNOSIS — T8484XA Pain due to internal orthopedic prosthetic devices, implants and grafts, initial encounter: Secondary | ICD-10-CM

## 2020-07-09 DIAGNOSIS — N529 Male erectile dysfunction, unspecified: Secondary | ICD-10-CM

## 2020-07-09 DIAGNOSIS — G894 Chronic pain syndrome: Secondary | ICD-10-CM

## 2020-07-09 DIAGNOSIS — F3342 Major depressive disorder, recurrent, in full remission: Secondary | ICD-10-CM | POA: Diagnosis not present

## 2020-07-09 DIAGNOSIS — G8929 Other chronic pain: Secondary | ICD-10-CM

## 2020-07-09 DIAGNOSIS — G4733 Obstructive sleep apnea (adult) (pediatric): Secondary | ICD-10-CM

## 2020-07-09 DIAGNOSIS — M25512 Pain in left shoulder: Secondary | ICD-10-CM | POA: Diagnosis not present

## 2020-07-09 DIAGNOSIS — Z9989 Dependence on other enabling machines and devices: Secondary | ICD-10-CM

## 2020-07-09 MED ORDER — METHYLPREDNISOLONE ACETATE 40 MG/ML IJ SUSP
40.0000 mg | Freq: Once | INTRAMUSCULAR | Status: AC
  Administered 2020-07-09: 40 mg via INTRA_ARTICULAR

## 2020-07-09 MED ORDER — LIDOCAINE HCL (PF) 1 % IJ SOLN
4.0000 mL | Freq: Once | INTRAMUSCULAR | Status: AC
  Administered 2020-07-09: 4 mL

## 2020-07-09 NOTE — Assessment & Plan Note (Signed)
Well controlled, chronic OSA on CPAP - Good adherence to CPAP nightly - Continue current CPAP therapy, patient seems to be benefiting from therapy  

## 2020-07-09 NOTE — Progress Notes (Signed)
Subjective:    Patient ID: Mark Wells, male    DOB: 11/05/1971, 49 y.o.   MRN: [id number]  Mark Wells is a 49 y.o. male presenting on 07/09/2020 for Foot Swelling, Erectile Dysfunction, and Shoulder Pain   HPI   Left sided Upper and Lower Extremity Hemiplegia /History of CVAw/ residual deficit - History of CVA in October 2018, he was in wheelchair afterward due to limited Left side upper and lower extremity hemiplegia.  Uses Hoveround for mobility - needs home care assistance, he has difficulty with bathing, feeding, and dressing, and would benefit from additional ADL supportive services  Chronic Pain Syndrome / Osteoarthritis Multiple Joints / Lumbar Spine DJD disc herniation L5 Background history of disc herniation 2006, he had up to 5 surgeries in 2 years, in [place] -Recent surgical history, he had a revision of back hardware by Dr Jordan Likes - 08/2018 -Follows with [facility] for spine specialist for injections and has seen their pain provider AlsoFollowed by [facility], Pain Management - Oxycodone-Acetaminophen 7.5/325mg  #90 onon 12/20/19, and Lyrica 300mg  #60 - He has improved with PRN Prednisone as well with steroid taper but had limited results. I have treated him with Robaxin muscle relaxant PRN with some relief - Recent refill Robaxin - he has received joint injections in past, requesting repeat on L shoulder. Recent ED visit 7/11 see x-ray below fall injury - He has received PCS services from [facility], last form submitted 12/2019. He said now he answered survey questions and hours reduced from 80 to 60.  OSA on CPAP Using CPAP nightly, benefitting  Erectile Dysfunction Chronic problem. In past used PDE5 medication. He asks about research study for erectile dysfunction. Has not been to Urologist. He also asks about trial of injection for erection   Major depression, recurrent in remission No new concern today. Not on therapy. See  PHQ   Depression screen [facility] 2/9 07/09/2020 07/09/2020 04/22/2020  Decreased Interest 0 0 0  Down, Depressed, Hopeless 0 0 0  PHQ - 2 Score 0 0 0  Altered sleeping 0 - -  Tired, decreased energy 1 - -  Change in appetite 0 - -  Feeling bad or failure about yourself  0 - -  Trouble concentrating 0 - -  Moving slowly or fidgety/restless 0 - -  Suicidal thoughts 0 - -  PHQ-9 Score 1 - -  Difficult doing work/chores Not difficult at all - -    Social History   Tobacco Use  . Smoking status: Current Every Day Smoker    Packs/day: 0.50    Types: Cigarettes  . Smokeless tobacco: Current User  Vaping Use  . Vaping Use: Never used  Substance Use Topics  . Alcohol use: No  . Drug use: No    Review of Systems Per HPI unless specifically indicated above     Objective:    BP 94/66   Pulse 83   Temp (!) 97.1 F (36.2 C) (Temporal)   SpO2 99%   Wt Readings from Last 3 Encounters:  07/05/20 171 lb (77.6 kg)  04/22/20 200 lb (90.7 kg)  01/21/20 200 lb (90.7 kg)    Physical Exam Vitals and nursing note reviewed.  Constitutional:      General: He is not in acute distress.    Appearance: He is well-developed. He is not diaphoretic.     Comments: Chronically ill-appearing, L sided upper extremity limited movement with some pain, cooperative, hoveround wheelchair  HENT:  Head: Normocephalic and atraumatic.  Eyes:     General:        Right eye: No discharge.        Left eye: No discharge.     Conjunctiva/sclera: Conjunctivae normal.  Neck:     Thyroid: No thyromegaly.  Cardiovascular:     Rate and Rhythm: Normal rate and regular rhythm.     Heart sounds: Normal heart sounds. No murmur heard.   Pulmonary:     Effort: Pulmonary effort is normal. No respiratory distress.     Breath sounds: Normal breath sounds. No wheezing or rales.  Musculoskeletal:     Cervical back: Normal range of motion and neck supple.     Comments: Upper Extremity Inspection: Left upper ext is  held at his side, with some spastic symptoms of shoulder and upper ext elbow ROM: limited abduction forward flex and internal rotation, has some mild range forward flex only, grip motion and elbow is reduced Strength: grip 3/5, forward flexion biceps strength 4/5, abduction triceps unable to test Neurovascular: distal intact on R, reduced on L  Lower Extremity Inspection: symmetrical Palpation: non tender ROM: R lower ext full ROM. L lower ext good ROM knee/hip flex ext, ankle dorsiplantar flex - only slightly reduced on Left Strength: strength mildly reduced to 4/5 on Left side, compared to R Neurovascular: sensation intact grossly distal  Lymphadenopathy:     Cervical: No cervical adenopathy.  Skin:    General: Skin is warm and dry.     Findings: No erythema or rash.  Neurological:     Mental Status: He is alert and oriented to person, place, and time.  Psychiatric:        Behavior: Behavior normal.     Comments: Well groomed, good eye contact, normal speech and thoughts      ________________________________________________________ PROCEDURE NOTE Date: 07/09/20 Left subacromial steroid injection Discussed benefits and risks (including pain, bleeding, infection, steroid flare). Verbal consent given by patient. Medication:  1 cc Depo-medrol 40mg  and 4 cc Lidocaine 1% without epi Time Out taken  Landmarks identified. Area cleansed with alcohol wipes. Using 21 gauge and 1, 1/2 inch needle, Left knee subacromial bursa space was injected (with above listed medication) via posterior approach, cold spray used for superficial anesthetic. Sterile bandage placed. Patient tolerated procedure well without bleeding or paresthesias. No complications.   I have personally reviewed the radiology report from 07/05/20 L Shoulder.  CLINICAL DATA:  Pain post fall  EXAM: LEFT SHOULDER - 2+ VIEW  COMPARISON:  None.  FINDINGS: There is no evidence of fracture or dislocation. There is  no evidence of arthropathy or other focal bone abnormality. Soft tissues are unremarkable.  IMPRESSION: Negative.   Electronically Signed   By: 09/05/20 M.D.   On: 07/05/2020 09:30  Results for orders placed or performed during the hospital encounter of 12/20/19  Urine culture   Specimen: Urine, Clean Catch  Result Value Ref Range   Specimen Description      URINE, CLEAN CATCH Performed at [facility], [address], [place], [place]    Special Requests      Normal Performed at [facility], [address] [place], [place]    Culture >=100,000 COLONIES/mL ESCHERICHIA COLI (A)    Report Status 12/23/2019 FINAL    Organism ID, Bacteria ESCHERICHIA COLI (A)       Susceptibility   Escherichia coli - MIC*    AMPICILLIN >=32 RESISTANT Resistant  CEFAZOLIN 16 SENSITIVE Sensitive     CEFTRIAXONE <=1 SENSITIVE Sensitive     CIPROFLOXACIN <=0.25 SENSITIVE Sensitive     GENTAMICIN <=1 SENSITIVE Sensitive     IMIPENEM <=0.25 SENSITIVE Sensitive     NITROFURANTOIN <=16 SENSITIVE Sensitive     TRIMETH/SULFA <=20 SENSITIVE Sensitive     AMPICILLIN/SULBACTAM >=32 RESISTANT Resistant     PIP/TAZO 8 SENSITIVE Sensitive     * >=100,000 COLONIES/mL ESCHERICHIA COLI  Urinalysis, Complete w Microscopic  Result Value Ref Range   Color, Urine YELLOW (A) YELLOW   APPearance CLOUDY (A) CLEAR   Specific Gravity, Urine 1.008 1.005 - 1.030   pH 5.0 5.0 - 8.0   Glucose, UA NEGATIVE NEGATIVE mg/dL   Hgb urine dipstick LARGE (A) NEGATIVE   Bilirubin Urine NEGATIVE NEGATIVE   Ketones, ur NEGATIVE NEGATIVE mg/dL   Protein, ur 454100 (A) NEGATIVE mg/dL   Nitrite POSITIVE (A) NEGATIVE   Leukocytes,Ua LARGE (A) NEGATIVE   RBC / HPF >50 (H) 0 - 5 RBC/hpf   WBC, UA >50 (H) 0 - 5 WBC/hpf   Bacteria, UA MANY (A) NONE SEEN   Squamous Epithelial / LPF NONE SEEN 0 - 5   WBC Clumps PRESENT    Mucus PRESENT   CBC with Differential  Result Value Ref Range    WBC 14.7 (H) 4.0 - 10.5 K/uL   RBC 4.74 4.22 - 5.81 MIL/uL   Hemoglobin 14.1 13.0 - 17.0 g/dL   HCT 09.841.9 39 - 52 %   MCV 88.4 80.0 - 100.0 fL   MCH 29.7 26.0 - 34.0 pg   MCHC 33.7 30.0 - 36.0 g/dL   RDW 11.912.2 14.711.5 - 82.915.5 %   Platelets 323 150 - 400 K/uL   nRBC 0.0 0.0 - 0.2 %   Neutrophils Relative % 79 %   Neutro Abs 11.4 (H) 1.7 - 7.7 K/uL   Lymphocytes Relative 14 %   Lymphs Abs 2.1 0.7 - 4.0 K/uL   Monocytes Relative 7 %   Monocytes Absolute 1.0 0 - 1 K/uL   Eosinophils Relative 0 %   Eosinophils Absolute 0.0 0 - 0 K/uL   Basophils Relative 0 %   Basophils Absolute 0.0 0 - 0 K/uL   Immature Granulocytes 0 %   Abs Immature Granulocytes 0.06 0.00 - 0.07 K/uL  Comprehensive metabolic panel  Result Value Ref Range   Sodium 140 135 - 145 mmol/L   Potassium 3.9 3.5 - 5.1 mmol/L   Chloride 101 98 - 111 mmol/L   CO2 27 22 - 32 mmol/L   Glucose, Bld 103 (H) 70 - 99 mg/dL   BUN 11 6 - 20 mg/dL   Creatinine, Ser 5.620.94 0.61 - 1.24 mg/dL   Calcium 8.9 8.9 - 13.010.3 mg/dL   Total Protein 8.0 6.5 - 8.1 g/dL   Albumin 3.6 3.5 - 5.0 g/dL   AST 14 (L) 15 - 41 U/L   ALT 17 0 - 44 U/L   Alkaline Phosphatase 71 38 - 126 U/L   Total Bilirubin 0.6 0.3 - 1.2 mg/dL   GFR calc non Af Amer >60 >60 mL/min   GFR calc Af Amer >60 >60 mL/min   Anion gap 12 5 - 15  Lactic acid, plasma  Result Value Ref Range   Lactic Acid, Venous 1.2 0.5 - 1.9 mmol/L      Assessment & Plan:   Problem List Items Addressed This Visit    Spastic hemiplegia of left dominant side as late  effect of cerebral infarction [facility]) - Primary   Painful orthopaedic hardware (HCC)   OSA on CPAP    Well controlled, chronic OSA on CPAP - Good adherence to CPAP nightly - Continue current CPAP therapy, patient seems to be benefiting from therapy       Major depression, recurrent, full remission (HCC)   LOW BACK PAIN SYNDROME, SEVERE   Insomnia   Chronic pain syndrome    Other Visit Diagnoses    Lumbar disc herniation        Chronic left shoulder pain       Relevant Medications   lidocaine (PF) (XYLOCAINE) 1 % injection 4 mL (Completed)   methylPREDNISolone acetate (DEPO-MEDROL) injection 40 mg (Completed)   Erectile dysfunction, unspecified erectile dysfunction type       Relevant Orders   Ambulatory referral to Urology   Spondylosis of lumbar region without myelopathy or radiculopathy       Relevant Medications   methylPREDNISolone acetate (DEPO-MEDROL) injection 40 mg (Completed)      Clinically with constellation of chronic medical problems related to chronic pain and weakness, underlying Chronic Residual Left sided upper extremity hemiplegia secondary to CVA/Stroke, and underlying Osteoarthritis degenerative disease multiple joints and Lumbar Spine. - He is debilitated by chronic pain and limited mobility - Followed by [facility] and [facility] for pain management, and has been treated [facility] PT in past.  He currently has been enrolled through [facility] services and his home aide hours have been reduced from 80 to 60 due to responses from him on questions/survey. He is requesting resubmit form for change hours back to 80.  Based on his chronic medical conditions and significant physical debilitation related to his prior stroke and residual weakness / hemiplegia, it is my medical opinion that he would benefit from additional hours up to 80.  L Shoulder Pain ED visit 07/05/20 Imaging x-ray reviewed above Chronic loss of function left upper ext due to prior stroke spastic hemiplegia Trial on subacromial bursa steroid injection, reviewed benefits risk, see procedure, tolerated well  #Major depression - remission Controlled mood now, doing better receiving some home care assistance. Not on medicine.  #ED Referral to [facility], meds or injection management  Orders Placed This Encounter  Procedures  . Ambulatory referral to Urology    Referral Priority:   Routine    Referral Type:    Consultation    Referral Reason:   Specialty Services Required    Requested Specialty:   Urology    Number of Visits Requested:   1     Meds ordered this encounter  Medications  . lidocaine (PF) (XYLOCAINE) 1 % injection 4 mL  . methylPREDNISolone acetate (DEPO-MEDROL) injection 40 mg      Follow up plan: Return in about 3 months (around 10/09/2020) for 3 month follow-up.   Saralyn Pilar, DO [facility] 07/09/2020, 11:33 AM

## 2020-07-09 NOTE — Patient Instructions (Addendum)
Thank you for coming to the office today.  You received a Left Shoulder Joint steroid injection today. - Lidocaine numbing medicine may ease the pain initially for a few hours until it wears off - As discussed, you may experience a "steroid flare" this evening or within 24-48 hours, anytime medicine is injected into an inflamed joint it can cause the pain to get worse temporarily - Everyone responds differently to these injections, it depends on the patient and the severity of the joint problem, it may provide anywhere from days to weeks, to months of relief. Ideal response is >6 months relief - Try to take it easy for next 1-2 days, avoid over activity and strain on joint (limit lifting for shoulder) - Recommend the following:   - For swelling - rest, compression sleeve / ACE wrap, elevation, and ice packs as needed for first few days   - For pain in future may use heating pad or moist heat as needed  --------------------   Stay tuned for appointment for erection function at [facility] -[facility] [address] [place],  [place]  [address] Phone: [phone]  For feet swelling  Compression socks, or ACE wrap, Elevation if possible.   Your provider would like to you have your annual eye exam. Please contact your current eye doctor or here are some good options for you to contact.   [facility]   Address: [address] [place], [place] [address] Phone: [phone]  Website: [email or web address]   [facility] [address], [place], [place] [address] Phone: [phone] [email or web address]  [facility]  Address: [address] [place], [place], [place] [address] Phone: [phone]   [facility] [address] [place], [place] [address] Phone: [phone]  [facility] Address: [address] [place], [place], [place] [address]  Phone: [phone]     Please schedule a Follow-up  Appointment to: Return in about 3 months (around 10/09/2020) for 3 month follow-up.  If you have any other questions or concerns, please feel free to call the office or send a message through MyChart. You may also schedule an earlier appointment if necessary.  Additionally, you may be receiving a survey about your experience at our office within a few days to 1 week by e-mail or mail. We value your feedback.  Saralyn Pilar, DO [facility] [place]

## 2020-07-16 ENCOUNTER — Other Ambulatory Visit: Payer: Self-pay

## 2020-07-16 ENCOUNTER — Ambulatory Visit (INDEPENDENT_AMBULATORY_CARE_PROVIDER_SITE_OTHER): Payer: Medicare Other | Admitting: Urology

## 2020-07-16 ENCOUNTER — Encounter: Payer: Self-pay | Admitting: Urology

## 2020-07-16 VITALS — BP 102/70 | HR 76 | Ht 66.0 in | Wt 160.0 lb

## 2020-07-16 DIAGNOSIS — N3281 Overactive bladder: Secondary | ICD-10-CM | POA: Diagnosis not present

## 2020-07-16 DIAGNOSIS — N529 Male erectile dysfunction, unspecified: Secondary | ICD-10-CM | POA: Diagnosis not present

## 2020-07-16 MED ORDER — TADALAFIL 10 MG PO TABS: 10.0000 mg | ORAL_TABLET | Freq: Every day | ORAL | 11 refills | Status: DC | PRN

## 2020-07-16 NOTE — Progress Notes (Signed)
   07/16/20 1:58 PM   Mark Wells 1971-10-21 [id number]  CC: Erectile dysfunction  HPI: I saw Mark Wells in urology clinic for erectile dysfunction.  Is a 49 year old comorbid male with a history of a stroke with residual deficits who uses a power wheelchair to get around he reports worsening erections over the last few years.  He is never tried any medications for this.  He is an active smoker.  He denies any urinary complaints, aside from occasional mild urinary incontinence, and he voids spontaneously.   PMH: Past Medical History:  Diagnosis Date  . Anemia   . Anxiety   . Chronic back pain   . COVID-19 06/2019   July 2020  . Depression   . Hemiplegia affecting left nondominant side (HCC)   . Hyperlipidemia   . Mood disorder (HCC)   . Personality disorder (HCC)   . Restless leg syndrome   . Sleep apnea   . Stroke [facility])     Surgical History: Past Surgical History:  Procedure Laterality Date  . BACK SURGERY     Family History: No family history on file.  Social History:  reports that he has been smoking cigarettes. He has been smoking about 0.50 packs per day. He uses smokeless tobacco. He reports that he does not drink alcohol and does not use drugs.  Physical Exam: BP 102/70   Pulse 76   Ht 5\' 6"  (1.676 m)   Wt 160 lb (72.6 kg)   BMI 25.82 kg/m    Constitutional:  Alert and oriented, No acute distress. Cardiovascular: No clubbing, cyanosis, or edema. Respiratory: Normal respiratory effort, no increased work of breathing. GI: Abdomen is soft, nontender, nondistended, no abdominal masses GU: Phallus with patent meatus, no penile lesions  Laboratory Data: Reviewed   Assessment & Plan:   49 year old male with history of stroke on anticoagulation with worsening erectile dysfunction.  We reviewed the AUA guidelines regarding the work-up, evaluation, and treatment of erectile dysfunction.  I recommended a trial of PDE 5 inhibitor, and we discussed the risks  and benefits of Cialis at length.  I recommended close follow-up in 6 weeks to check on his symptoms, as well as a renal ultrasound to evaluate for any hydronephrosis or bladder distention in the history of his prior stroke.  I also counseled him extensively that smoking cessation will likely not only improve his erections, but his overall health.  Consider testosterone screening and follow-up if no improvement with Cialis  Trial of Cialis 10 mg on demand for ED RTC 6 weeks with renal 44 prior  US, MD 07/16/2020  [facility] [address], [address] [place], [place] [phone]

## 2020-07-16 NOTE — Patient Instructions (Signed)
Tadalafil tablets (Cialis) What is this medicine? TADALAFIL (tah DA la fil) is used to treat erection problems in men. It is also used for enlargement of the prostate gland in men, a condition called benign prostatic hyperplasia or BPH. This medicine improves urine flow and reduces BPH symptoms. This medicine can also treat both erection problems and BPH when they occur together. This medicine may be used for other purposes; ask your health care provider or pharmacist if you have questions. COMMON BRAND NAME(S): Adcirca, ALYQ, Cialis What should I tell my health care provider before I take this medicine? They need to know if you have any of these conditions:  bleeding disorders  eye or vision problems, including a rare inherited eye disease called retinitis pigmentosa  anatomical deformation of the penis, Peyronie's disease, or history of priapism (painful and prolonged erection)  heart disease, angina, a history of heart attack, irregular heart beats, or other heart problems  high or low blood pressure  history of blood diseases, like sickle cell anemia or leukemia  history of stomach bleeding  kidney disease  liver disease  stroke  an unusual or allergic reaction to tadalafil, other medicines, foods, dyes, or preservatives  pregnant or trying to get pregnant  breast-feeding How should I use this medicine? Take this medicine by mouth with a glass of water. Follow the directions on the prescription label. You may take this medicine with or without meals. When this medicine is used for erection problems, your doctor may prescribe it to be taken once daily or as needed. If you are taking the medicine as needed, you may be able to have sexual activity 30 minutes after taking it and for up to 36 hours after taking it. Whether you are taking the medicine as needed or once daily, you should not take more than one dose per day. If you are taking this medicine for symptoms of benign  prostatic hyperplasia (BPH) or to treat both BPH and an erection problem, take the dose once daily at about the same time each day. Do not take your medicine more often than directed. Talk to your pediatrician regarding the use of this medicine in children. Special care may be needed. Overdosage: If you think you have taken too much of this medicine contact a poison control center or emergency room at once. NOTE: This medicine is only for you. Do not share this medicine with others. What if I miss a dose? If you are taking this medicine as needed for erection problems, this does not apply. If you miss a dose while taking this medicine once daily for an erection problem, benign prostatic hyperplasia, or both, take it as soon as you remember, but do not take more than one dose per day. What may interact with this medicine? Do not take this medicine with any of the following medications:  nitrates like amyl nitrite, isosorbide dinitrate, isosorbide mononitrate, nitroglycerin  other medicines for erectile dysfunction like avanafil, sildenafil, vardenafil  other tadalafil products (Adcirca)  riociguat This medicine may also interact with the following medications:  certain drugs for high blood pressure  certain drugs for the treatment of HIV infection or AIDS  certain drugs used for fungal or yeast infections, like fluconazole, itraconazole, ketoconazole, and voriconazole  certain drugs used for seizures like carbamazepine, phenytoin, and phenobarbital  grapefruit juice  macrolide antibiotics like clarithromycin, erythromycin, troleandomycin  medicines for prostate problems  rifabutin, rifampin or rifapentine This list may not describe all possible interactions. Give your   health care provider a list of all the medicines, herbs, non-prescription drugs, or dietary supplements you use. Also tell them if you smoke, drink alcohol, or use illegal drugs. Some items may interact with your  medicine. What should I watch for while using this medicine? If you notice any changes in your vision while taking this drug, call your doctor or health care professional as soon as possible. Stop using this medicine and call your health care provider right away if you have a loss of sight in one or both eyes. Contact your doctor or health care professional right away if the erection lasts longer than 4 hours or if it becomes painful. This may be a sign of serious problem and must be treated right away to prevent permanent damage. If you experience symptoms of nausea, dizziness, chest pain or arm pain upon initiation of sexual activity after taking this medicine, you should refrain from further activity and call your doctor or health care professional as soon as possible. Do not drink alcohol to excess (examples, 5 glasses of wine or 5 shots of whiskey) when taking this medicine. When taken in excess, alcohol can increase your chances of getting a headache or getting dizzy, increasing your heart rate or lowering your blood pressure. Using this medicine does not protect you or your partner against HIV infection (the virus that causes AIDS) or other sexually transmitted diseases. What side effects may I notice from receiving this medicine? Side effects that you should report to your doctor or health care professional as soon as possible:  allergic reactions like skin rash, itching or hives, swelling of the face, lips, or tongue  breathing problems  changes in hearing  changes in vision  chest pain  fast, irregular heartbeat  prolonged or painful erection  seizures Side effects that usually do not require medical attention (report to your doctor or health care professional if they continue or are bothersome):  back pain  dizziness  flushing  headache  indigestion  muscle aches  nausea  stuffy or runny nose This list may not describe all possible side effects. Call your doctor  for medical advice about side effects. You may report side effects to FDA at 1-800-FDA-1088. Where should I keep my medicine? Keep out of the reach of children. Store at room temperature between 15 and 30 degrees C (59 and 86 degrees F). Throw away any unused medicine after the expiration date. NOTE: This sheet is a summary. It may not cover all possible information. If you have questions about this medicine, talk to your doctor, pharmacist, or health care provider.  2020 Elsevier/Gold Standard (2014-05-02 13:15:49)  

## 2020-09-03 ENCOUNTER — Ambulatory Visit: Payer: Self-pay | Admitting: Urology

## 2020-09-03 ENCOUNTER — Other Ambulatory Visit: Payer: Self-pay | Admitting: Family Medicine

## 2020-09-03 DIAGNOSIS — I6930 Unspecified sequelae of cerebral infarction: Secondary | ICD-10-CM

## 2020-09-03 DIAGNOSIS — E782 Mixed hyperlipidemia: Secondary | ICD-10-CM

## 2020-09-03 NOTE — Telephone Encounter (Signed)
Requested  medications are  due for refill today yes  Requested medications are on the active medication list yes  Last refill 6/4  Notes to clinic Failed protocol of no relevant lab work within 360 days.

## 2020-09-04 ENCOUNTER — Encounter: Payer: Self-pay | Admitting: Urology

## 2020-10-08 ENCOUNTER — Other Ambulatory Visit: Payer: Self-pay | Admitting: Family Medicine

## 2020-10-08 DIAGNOSIS — I6930 Unspecified sequelae of cerebral infarction: Secondary | ICD-10-CM

## 2020-10-11 ENCOUNTER — Other Ambulatory Visit: Payer: Self-pay | Admitting: Family Medicine

## 2020-10-11 DIAGNOSIS — I10 Essential (primary) hypertension: Secondary | ICD-10-CM

## 2020-10-11 NOTE — Telephone Encounter (Signed)
Requested Prescriptions  Pending Prescriptions Disp Refills  . amLODipine (NORVASC) 5 MG tablet [Pharmacy Med Name: AMLODIPINE BESYLATE 5 MG TAB] 90 tablet 0    Sig: TAKE 1 TABLET BY MOUTH EVERY DAY     Cardiovascular:  Calcium Channel Blockers Passed - 10/11/2020 10:49 AM      Passed - Last BP in normal range    BP Readings from Last 1 Encounters:  07/16/20 102/70         Passed - Valid encounter within last 6 months    Recent Outpatient Visits          3 months ago Spastic hemiplegia of left dominant side as late effect of cerebral infarction [facility])   [facility], Netta Neat, DO   5 months ago Spastic hemiplegia of left dominant side as late effect of cerebral infarction [facility])   [facility] Smitty Cords, DO   8 months ago Spastic hemiplegia of left dominant side as late effect of cerebral infarction [facility])   [facility], Netta Neat, DO   11 months ago Spastic hemiplegia of left dominant side as late effect of cerebral infarction [facility])   [facility] Smitty Cords, DO   1 year ago Chronic pain syndrome   [facility], Netta Neat, DO             . chlorthalidone (HYGROTON) 25 MG tablet [Pharmacy Med Name: CHLORTHALIDONE 25 MG TABLET] 90 tablet 0    Sig: TAKE 1 TABLET BY MOUTH EVERY DAY     Cardiovascular: Diuretics - Thiazide Passed - 10/11/2020 10:49 AM      Passed - Ca in normal range and within 360 days    Calcium  Date Value Ref Range Status  12/20/2019 8.9 8.9 - 10.3 mg/dL Final   Calcium, Total  Date Value Ref Range Status  01/12/2015 8.2 (L) 8.5 - 10.1 mg/dL Final         Passed - Cr in normal range and within 360 days    Creatinine  Date Value Ref Range Status  01/12/2015 0.97 0.60 - 1.30 mg/dL Final   Creatinine, Ser  Date Value Ref Range Status  12/20/2019 0.94 0.61 - 1.24 mg/dL Final         Passed - K in normal  range and within 360 days    Potassium  Date Value Ref Range Status  12/20/2019 3.9 3.5 - 5.1 mmol/L Final  01/12/2015 4.0 3.5 - 5.1 mmol/L Final         Passed - Na in normal range and within 360 days    Sodium  Date Value Ref Range Status  12/20/2019 140 135 - 145 mmol/L Final  01/12/2015 138 136 - 145 mmol/L Final         Passed - Last BP in normal range    BP Readings from Last 1 Encounters:  07/16/20 102/70         Passed - Valid encounter within last 6 months    Recent Outpatient Visits          3 months ago Spastic hemiplegia of left dominant side as late effect of cerebral infarction [facility])   [facility] Smitty Cords, DO   5 months ago Spastic hemiplegia of left dominant side as late effect of cerebral infarction [facility])   [facility] Smitty Cords, DO   8 months ago Spastic hemiplegia of  left dominant side as late effect of cerebral infarction [facility])   [facility], Netta Neat, DO   11 months ago Spastic hemiplegia of left dominant side as late effect of cerebral infarction [facility])   [facility] Smitty Cords, DO   1 year ago Chronic pain syndrome   [facility] [place], Netta Neat, [place]

## 2020-10-21 ENCOUNTER — Other Ambulatory Visit: Payer: Self-pay | Admitting: Family Medicine

## 2020-10-21 DIAGNOSIS — I10 Essential (primary) hypertension: Secondary | ICD-10-CM

## 2020-11-03 ENCOUNTER — Telehealth: Payer: Self-pay

## 2020-11-03 NOTE — Telephone Encounter (Signed)
Received Forms dropped off from patient needing to be filled out by Dr. Althea Charon. Called patient and could not reach him. Left a VM informing him he needs to call back and schedule a in person or Virtual visit to have these forms completed.   Awaiting call back from patient to schedule appointment.   Namya Voges

## 2020-11-09 ENCOUNTER — Other Ambulatory Visit: Payer: Self-pay

## 2020-11-09 ENCOUNTER — Encounter: Payer: Self-pay | Admitting: Family Medicine

## 2020-11-09 ENCOUNTER — Ambulatory Visit (INDEPENDENT_AMBULATORY_CARE_PROVIDER_SITE_OTHER): Payer: Medicare Other | Admitting: Family Medicine

## 2020-11-09 VITALS — BP 110/66 | HR 75 | Temp 97.5°F | Resp 16 | Ht 66.0 in | Wt 178.0 lb

## 2020-11-09 DIAGNOSIS — G894 Chronic pain syndrome: Secondary | ICD-10-CM

## 2020-11-09 DIAGNOSIS — G8929 Other chronic pain: Secondary | ICD-10-CM | POA: Diagnosis not present

## 2020-11-09 DIAGNOSIS — M25512 Pain in left shoulder: Secondary | ICD-10-CM | POA: Diagnosis not present

## 2020-11-09 DIAGNOSIS — F331 Major depressive disorder, recurrent, moderate: Secondary | ICD-10-CM

## 2020-11-09 DIAGNOSIS — M545 Low back pain, unspecified: Secondary | ICD-10-CM | POA: Diagnosis not present

## 2020-11-09 DIAGNOSIS — M5126 Other intervertebral disc displacement, lumbar region: Secondary | ICD-10-CM | POA: Diagnosis not present

## 2020-11-09 DIAGNOSIS — G4701 Insomnia due to medical condition: Secondary | ICD-10-CM

## 2020-11-09 DIAGNOSIS — I69352 Hemiplegia and hemiparesis following cerebral infarction affecting left dominant side: Secondary | ICD-10-CM

## 2020-11-09 DIAGNOSIS — T8484XA Pain due to internal orthopedic prosthetic devices, implants and grafts, initial encounter: Secondary | ICD-10-CM

## 2020-11-09 DIAGNOSIS — N39498 Other specified urinary incontinence: Secondary | ICD-10-CM

## 2020-11-09 DIAGNOSIS — M6281 Muscle weakness (generalized): Secondary | ICD-10-CM

## 2020-11-09 MED ORDER — METHYLPREDNISOLONE ACETATE 40 MG/ML IJ SUSP
40.0000 mg | Freq: Once | INTRAMUSCULAR | Status: AC
  Administered 2020-11-09: 40 mg via INTRA_ARTICULAR

## 2020-11-09 MED ORDER — LIDOCAINE HCL (PF) 1 % IJ SOLN
4.0000 mL | Freq: Once | INTRAMUSCULAR | Status: AC
  Administered 2020-11-09: 4 mL

## 2020-11-09 MED ORDER — KETOROLAC TROMETHAMINE 60 MG/2ML IM SOLN
60.0000 mg | Freq: Once | INTRAMUSCULAR | Status: AC
  Administered 2020-11-09: 60 mg via INTRAMUSCULAR

## 2020-11-09 MED ORDER — SERTRALINE HCL 50 MG PO TABS: 50.0000 mg | ORAL_TABLET | Freq: Every day | ORAL | 1 refills | Status: DC

## 2020-11-09 NOTE — Patient Instructions (Addendum)
Thank you for coming to the office today.   [facility] [address] [place], [place] [address] Ph: [phone]  Home Medical Equipment (Wheelchairs etc) Retail: [phone] Customer Service: [phone] Toll Free: [phone]  [facility] [address] [place], [place] [address] Open until 5PM Phone: [phone]  [facility]. [address] [place], [place] [address] Ph: [phone]  Check with your insurance to determine which location will cover your medical equipment. Take Wheelchair rx to them and they can process it and send [place] paperwork if needed.  Injection today.  Refilled Zoloft 50mg  daily, may go up on dose to 100mg  in future - let me know if need refill  ------  [facility]   Address: [address] [place], [address] [place] Phone: [phone]  Website: [email or web address]   [facility] [address], [place], [address] [place] Phone: [phone] [email or web address]  [facility]  Address: [address] [place], [place], [place] Phone: [phone]   [facility] [address] [place], [address] [place] Phone: [phone]  [facility] Address: [address] [place], [place], [place]  Phone: [phone]    Please schedule a Follow-up Appointment to: Return in about 4 months (around 03/09/2021) for 4 month follow-up mood depression, pain.  If you have any other questions or concerns, please feel free to call the office or send a message through MyChart. You may also schedule an earlier appointment if necessary.  Additionally, you may be receiving a survey about your experience at our office within a few days to 1 week by e-mail or mail. We value your feedback.  [phone], DO [facility], Saralyn Pilar

## 2020-11-09 NOTE — Progress Notes (Addendum)
Subjective:    Patient ID: Mark Wells, male    DOB: December 24, 1971, 49 y.o.   MRN: [id number]  Mark Wells is a 49 y.o. male presenting on 11/09/2020 for paper work (medical supply, wheel chair, steroid injection for shoulder, toradol for back pain) and Depression (wants to get Rx for zoloft)  Caregiver here for additional history.  HPI   Left sided Upper and Lower Extremity Hemiplegia /History of CVAw/ residual deficit - History of CVA in October 2018, he was in wheelchair afterward due to limited Left side upper and lower extremity hemiplegia.  Uses [employer] for mobility - needs home care assistance, he has difficulty with bathing, feeding, and dressing.  Chronic Pain Syndrome / Osteoarthritis Multiple Joints / Lumbar Spine DJD disc herniation L5 Background history of disc herniation 2006, he had up to 5 surgeries in 2 years, in [place] -Recent surgical history, he had a revision of back hardware by Dr Jordan Likes - 08/2018 -Follows with [facility] for spine specialist for injections and has seen their pain provider AlsoFollowed by [facility] Neurology, Pain Management - Oxycodone-Acetaminophen 7.5/325mg  and Lyrica  - He has improved with PRNPrednisoneas well with steroid taper but had limited results. I have treated him with Robaxin muscle relaxant PRN with some relief - he has received joint injections in past, requesting repeat on L shoulder, last done 06/2020 - He has received PCS services in past  He is interested in a portable manual wheelchair for future, he has other supplies. Needs this to be transportable so he has wheelchair after traveling in car and to improve his mobility outside of the home.  Urinary Incontinence Complication of chronic back pain, he has urinary incontinence and requires medical supplies for pads and underwear, needs re certification on this issue.  Major depression, recurrent - Moderate Concern with some elevated depression symptoms,  wants to restart Zoloft was on  in past, interested to resume now.   Depression screen [facility] 2/9 11/09/2020 07/09/2020 07/09/2020  Decreased Interest 0 0 0  Down, Depressed, Hopeless 2 0 0  PHQ - 2 Score 2 0 0  Altered sleeping 1 0 -  Tired, decreased energy 2 1 -  Change in appetite 2 0 -  Feeling bad or failure about yourself  2 0 -  Trouble concentrating 3 0 -  Moving slowly or fidgety/restless 0 0 -  Suicidal thoughts 0 0 -  PHQ-9 Score 12 1 -  Difficult doing work/chores Somewhat difficult Not difficult at all -   GAD 7 : Generalized Anxiety Score 11/09/2020 10/23/2019  Nervous, Anxious, on Edge 0 1  Control/stop worrying 0 1  Worry too much - different things 0 1  Trouble relaxing 0 1  Restless 0 1  Easily annoyed or irritable 0 1  Afraid - awful might happen 0 1  Total GAD 7 Score 0 7  Anxiety Difficulty Not difficult at all Somewhat difficult      Social History   Tobacco Use  . Smoking status: Current Every Day Smoker    Packs/day: 0.50    Types: Cigarettes  . Smokeless tobacco: Current User  Vaping Use  . Vaping Use: Never used  Substance Use Topics  . Alcohol use: No  . Drug use: No    Review of Systems Per HPI unless specifically indicated above     Objective:    BP 110/66   Pulse 75   Temp (!) 97.5 F (36.4 C) (Temporal)   Resp 16   Ht  5\' 6"  (1.676 m)   Wt 178 lb (80.7 kg)   SpO2 95%   BMI 28.73 kg/m   Wt Readings from Last 3 Encounters:  11/09/20 178 lb (80.7 kg)  07/16/20 160 lb (72.6 kg)  07/05/20 171 lb (77.6 kg)    Physical Exam Vitals and nursing note reviewed.  Constitutional:      General: He is not in acute distress.    Appearance: He is well-developed. He is not diaphoretic.     Comments: Chronically ill-appearing, L sided upper extremity limited movement with some pain, cooperative in wheelchair  HENT:     Head: Normocephalic and atraumatic.  Eyes:     General:        Right eye: No discharge.        Left eye: No  discharge.     Conjunctiva/sclera: Conjunctivae normal.  Neck:     Thyroid: No thyromegaly.  Cardiovascular:     Rate and Rhythm: Normal rate and regular rhythm.     Heart sounds: Normal heart sounds. No murmur heard.   Pulmonary:     Effort: Pulmonary effort is normal. No respiratory distress.     Breath sounds: Normal breath sounds. No wheezing or rales.  Musculoskeletal:     Cervical back: Normal range of motion and neck supple.     Comments: Upper Extremity Inspection: Left upper ext is held at his side, with some spastic symptoms of shoulder and upper ext elbow ROM: limited abduction forward flex and internal rotation, has some mild range forward flex only, grip motion and elbow is reduced Strength: grip 3/5, forward flexion biceps strength 4/5, abduction triceps unable to test Neurovascular: distal intact on R, reduced on L  Lower Extremity Inspection: symmetrical Palpation: non tender ROM: R lower ext full ROM. L lower ext good ROM knee/hip flex ext, ankle dorsiplantar flex - only slightly reduced on Left Strength: strength mildly reduced to 4/5 on Left side, compared to R Neurovascular: sensation intact grossly distal  Lymphadenopathy:     Cervical: No cervical adenopathy.  Skin:    General: Skin is warm and dry.     Findings: No erythema or rash.  Neurological:     Mental Status: He is alert and oriented to person, place, and time.  Psychiatric:        Behavior: Behavior normal.     Comments: Well groomed, good eye contact, normal speech and thoughts      ________________________________________________________ PROCEDURE NOTE Date: 11/09/20 Left shoulder steroid injection Discussed benefits and risks (including pain, bleeding, infection, steroid flare). Verbal consent given by patient. Medication:  1 cc Depo-medrol 40mg  and 4 cc Lidocaine 1% without epi Time Out taken  Landmarks identified. Area cleansed with alcohol wipes. Using 21 gauge and 1, 1/2 inch needle,  Left  subacromial bursa space was injected (with above listed medication) via posterior approach cold spray used for superficial anesthetic. Sterile bandage placed. Patient tolerated procedure well without bleeding or paresthesias. No complications.    Results for orders placed or performed during the hospital encounter of 12/20/19  Urine culture   Specimen: Urine, Clean Catch  Result Value Ref Range   Specimen Description      URINE, CLEAN CATCH Performed at [facility], [address], [place], [address] [place]    Special Requests      Normal Performed at [facility], [address] [place], [address] [place]    Culture >=100,000 COLONIES/mL ESCHERICHIA COLI (A)    Report Status  12/23/2019 FINAL    Organism ID, Bacteria ESCHERICHIA COLI (A)       Susceptibility   Escherichia coli - MIC*    AMPICILLIN >=32 RESISTANT Resistant     CEFAZOLIN 16 SENSITIVE Sensitive     CEFTRIAXONE <=1 SENSITIVE Sensitive     CIPROFLOXACIN <=0.25 SENSITIVE Sensitive     GENTAMICIN <=1 SENSITIVE Sensitive     IMIPENEM <=0.25 SENSITIVE Sensitive     NITROFURANTOIN <=16 SENSITIVE Sensitive     TRIMETH/SULFA <=20 SENSITIVE Sensitive     AMPICILLIN/SULBACTAM >=32 RESISTANT Resistant     PIP/TAZO 8 SENSITIVE Sensitive     * >=100,000 COLONIES/mL ESCHERICHIA COLI  Urinalysis, Complete w Microscopic  Result Value Ref Range   Color, Urine YELLOW (A) YELLOW   APPearance CLOUDY (A) CLEAR   Specific Gravity, Urine 1.008 1.005 - 1.030   pH 5.0 5.0 - 8.0   Glucose, UA NEGATIVE NEGATIVE mg/dL   Hgb urine dipstick LARGE (A) NEGATIVE   Bilirubin Urine NEGATIVE NEGATIVE   Ketones, ur NEGATIVE NEGATIVE mg/dL   Protein, ur 631 (A) NEGATIVE mg/dL   Nitrite POSITIVE (A) NEGATIVE   Leukocytes,Ua LARGE (A) NEGATIVE   RBC / HPF >50 (H) 0 - 5 RBC/hpf   WBC, UA >50 (H) 0 - 5 WBC/hpf   Bacteria, UA MANY (A) NONE SEEN   Squamous Epithelial / LPF NONE SEEN 0 - 5   WBC Clumps PRESENT    Mucus  PRESENT   CBC with Differential  Result Value Ref Range   WBC 14.7 (H) 4.0 - 10.5 K/uL   RBC 4.74 4.22 - 5.81 MIL/uL   Hemoglobin 14.1 13.0 - 17.0 g/dL   HCT 49.7 39 - 52 %   MCV 88.4 80.0 - 100.0 fL   MCH 29.7 26.0 - 34.0 pg   MCHC 33.7 30.0 - 36.0 g/dL   RDW 02.6 37.8 - 58.8 %   Platelets 323 150 - 400 K/uL   nRBC 0.0 0.0 - 0.2 %   Neutrophils Relative % 79 %   Neutro Abs 11.4 (H) 1.7 - 7.7 K/uL   Lymphocytes Relative 14 %   Lymphs Abs 2.1 0.7 - 4.0 K/uL   Monocytes Relative 7 %   Monocytes Absolute 1.0 0.1 - 1.0 K/uL   Eosinophils Relative 0 %   Eosinophils Absolute 0.0 0.0 - 0.5 K/uL   Basophils Relative 0 %   Basophils Absolute 0.0 0.0 - 0.1 K/uL   Immature Granulocytes 0 %   Abs Immature Granulocytes 0.06 0.00 - 0.07 K/uL  Comprehensive metabolic panel  Result Value Ref Range   Sodium 140 135 - 145 mmol/L   Potassium 3.9 3.5 - 5.1 mmol/L   Chloride 101 98 - 111 mmol/L   CO2 27 22 - 32 mmol/L   Glucose, Bld 103 (H) 70 - 99 mg/dL   BUN 11 6 - 20 mg/dL   Creatinine, Ser 5.02 0.61 - 1.24 mg/dL   Calcium 8.9 8.9 - 77.4 mg/dL   Total Protein 8.0 6.5 - 8.1 g/dL   Albumin 3.6 3.5 - 5.0 g/dL   AST 14 (L) 15 - 41 U/L   ALT 17 0 - 44 U/L   Alkaline Phosphatase 71 38 - 126 U/L   Total Bilirubin 0.6 0.3 - 1.2 mg/dL   GFR calc non Af Amer >60 >60 mL/min   GFR calc Af Amer >60 >60 mL/min   Anion gap 12 5 - 15  Lactic acid, plasma  Result Value Ref Range  Lactic Acid, Venous 1.2 0.5 - 1.9 mmol/L      Assessment & Plan:   Problem List Items Addressed This Visit    Spastic hemiplegia of left dominant side as late effect of cerebral infarction (HCC) - Primary   Painful orthopaedic hardware (HCC)   OTHER URINARY INCONTINENCE   Major depression, recurrent, full remission (HCC)   Relevant Medications   sertraline (ZOLOFT) 50 MG tablet   LOW BACK PAIN SYNDROME, SEVERE   Insomnia   Relevant Medications   sertraline (ZOLOFT) 50 MG tablet   Chronic pain syndrome   Relevant  Medications   sertraline (ZOLOFT) 50 MG tablet    Other Visit Diagnoses    Lumbar disc herniation       Chronic left shoulder pain       Relevant Medications   sertraline (ZOLOFT) 50 MG tablet   ketorolac (TORADOL) injection 60 mg (Completed)   lidocaine (PF) (XYLOCAINE) 1 % injection 4 mL (Completed)   methylPREDNISolone acetate (DEPO-MEDROL) injection 40 mg (Completed)   Left-sided muscle weakness          Clinically with constellation of chronic medical problems related to chronic pain and weakness, underlying Chronic Residual Left sided upper extremity hemiplegia secondary to CVA/Stroke, and underlying Osteoarthritis degenerative disease multiple joints and Lumbar Spine. - He is debilitated by chronic pain and limited mobility - Followed by [facility] and [facility] for pain management, and has been treated [facility] PT in past.  He receives [facility] services currently.  Based on his chronic medical conditions and significant physical debilitation related to his prior stroke and residual weakness / hemiplegia, it is my medical opinion that he would benefit a new order today for a Portable Manual Wheelchair - to improve his mobility when needs to go to doctors appointment and other locations outside the home, it could be transportable in a vehicle and used for patient outside the home.  Cane or walker will not suffice.  He has significant weakness as documented above with chronic LEFT upper extremity hemiplegia with 3-4 out of 5 strength and reduced range of motion limiting his ability to propel wheelchair and will need assistance to propel it.  He currently has a power wheelchair that can be used in the home and other supplies.  Will complete a written rx for wheelchair and give to patient   L Shoulder Pain Recurrent problem Previous Imaging x-ray reviewed above Chronic loss of function left upper ext due to prior stroke spastic hemiplegia Repeat subacromial bursa  steroid injection, reviewed benefits risk, see procedure, tolerated well Toradol 60mg  IM injection today  #Urinary Incontinence He would benefit from DME supplies for incontinence, due to complication with his back pain prior injury causing urinary incontinence symptoms.  #Major depression - moderate recurrent Now worsening mood, will restart Zoloft 50mg  daily can increase to 100mg  in future if indicated   Meds ordered this encounter  Medications  . sertraline (ZOLOFT) 50 MG tablet    Sig: Take 1 tablet (50 mg total) by mouth daily. May increase to 2 pills in future for 100mg , notify office if need new order.    Dispense:  90 tablet    Refill:  1  . ketorolac (TORADOL) injection 60 mg  . lidocaine (PF) (XYLOCAINE) 1 % injection 4 mL  . methylPREDNISolone acetate (DEPO-MEDROL) injection 40 mg      Follow up plan: Return in about 4 months (around 03/09/2021) for 4 month follow-up mood depression, pain.   , DO  [facility] 11/09/2020, 2:35 PM

## 2020-11-13 ENCOUNTER — Telehealth: Payer: Self-pay

## 2020-11-13 ENCOUNTER — Telehealth: Payer: Self-pay | Admitting: Family Medicine

## 2020-11-13 DIAGNOSIS — M6281 Muscle weakness (generalized): Secondary | ICD-10-CM

## 2020-11-13 NOTE — Telephone Encounter (Signed)
All forms were completed and faxed the day after his appointment.  I would recommend that he wait at least a week and he can call the company to see if they received it, or we can try to refax.  Saralyn Pilar, DO [facility] 11/13/2020, 12:17 PM

## 2020-11-13 NOTE — Telephone Encounter (Signed)
Patient informed. 

## 2020-11-13 NOTE — Telephone Encounter (Signed)
Copied from CRM [id number]. Topic: General - Other >> Nov 13, 2020 11:05 AM Leafy Ro wrote: Reason for CRM: pt had an appt with dr Kirtland Bouchard on 11-09-2020 and he is calling checking on the status of urinary incontinence supplies the form was suppose to be faxed

## 2020-11-13 NOTE — Telephone Encounter (Signed)
Patient is calling to request an order for wheelchair to be fax to [employer] fax- [phone]

## 2020-11-16 NOTE — Telephone Encounter (Signed)
Handwritten new order for portable manual wheelchair  Ready to be faxed w/ office note  Saralyn Pilar, DO [facility] 11/16/2020, 10:14 AM

## 2020-11-17 NOTE — Telephone Encounter (Addendum)
Steward Drone with [employer] is calling  that dx does not qualify the pt for a wheelchair he must have muscle weakness and cane and walker would not suffice

## 2020-11-17 NOTE — Telephone Encounter (Signed)
Called Steward Drone back with [employer]  Confirmed rx needs to actually say "weakness" not hemiplegia.  New code L sided muscle weakness, M62.81 and "cane or walker will not suffice" on new rx ready to be fax  [facility] [employer] fax- [phone]  Saralyn Pilar, DO [facility] 11/17/2020, 4:36 PM

## 2020-12-05 ENCOUNTER — Other Ambulatory Visit: Payer: Self-pay | Admitting: Family Medicine

## 2020-12-05 DIAGNOSIS — E782 Mixed hyperlipidemia: Secondary | ICD-10-CM

## 2020-12-05 DIAGNOSIS — I6930 Unspecified sequelae of cerebral infarction: Secondary | ICD-10-CM

## 2020-12-05 NOTE — Telephone Encounter (Signed)
Requested medication (s) are due for refill today: yes  Requested medication (s) are on the active medication list: yes  Last refill:  09/03/20  Future visit scheduled: no  Notes to clinic:  overdue for lab work   Requested Prescriptions  Pending Prescriptions Disp Refills   atorvastatin (LIPITOR) 40 MG tablet [Pharmacy Med Name: ATORVASTATIN 40 MG TABLET] 90 tablet 0    Sig: TAKE 1 TABLET BY MOUTH EVERY DAY IN THE EVENING      Cardiovascular:  Antilipid - Statins Failed - 12/05/2020  9:07 AM      Failed - Total Cholesterol in normal range and within 360 days    No results found for: CHOL, POCCHOL, CHOLTOT        Failed - LDL in normal range and within 360 days    No results found for: LDLCALC, LDLC, HIRISKLDL, POCLDL, LDLDIRECT, REALLDLC, TOTLDLC        Failed - HDL in normal range and within 360 days    No results found for: HDL, POCHDL        Failed - Triglycerides in normal range and within 360 days    No results found for: TRIG, POCTRIG        Passed - Patient is not pregnant      Passed - Valid encounter within last 12 months    Recent Outpatient Visits           3 weeks ago Spastic hemiplegia of left dominant side as late effect of cerebral infarction [facility])   [facility], Netta Neat, DO   4 months ago Spastic hemiplegia of left dominant side as late effect of cerebral infarction [facility])   [facility], Netta Neat, DO   7 months ago Spastic hemiplegia of left dominant side as late effect of cerebral infarction [facility])   [facility], Netta Neat, DO   10 months ago Spastic hemiplegia of left dominant side as late effect of cerebral infarction [facility])   [facility] Smitty Cords, DO   1 year ago Spastic hemiplegia of left dominant side as late effect of cerebral infarction [facility])   [facility], Netta Neat, DO

## 2020-12-10 ENCOUNTER — Other Ambulatory Visit: Payer: Self-pay | Admitting: Family Medicine

## 2020-12-10 DIAGNOSIS — I69352 Hemiplegia and hemiparesis following cerebral infarction affecting left dominant side: Secondary | ICD-10-CM

## 2020-12-10 DIAGNOSIS — I6930 Unspecified sequelae of cerebral infarction: Secondary | ICD-10-CM

## 2020-12-10 DIAGNOSIS — I10 Essential (primary) hypertension: Secondary | ICD-10-CM

## 2020-12-10 NOTE — Telephone Encounter (Signed)
Requested medication (s) are due for refill today: yes  Requested medication (s) are on the active medication list: yes  Last refill:  07/08/20 #120  Future visit scheduled: yes  Notes to clinic:  Please review for refill. Refill not delegated per protocol    Requested Prescriptions  Pending Prescriptions Disp Refills   methocarbamol (ROBAXIN) 500 MG tablet [Pharmacy Med Name: METHOCARBAMOL 500 MG TABLET] 120 tablet 0    Sig: TAKE 1 TABLET (500 MG TOTAL) BY MOUTH EVERY 6 (SIX) HOURS AS NEEDED FOR MUSCLE SPASMS.      Not Delegated - Analgesics:  Muscle Relaxants Failed - 12/10/2020 10:37 AM      Failed - This refill cannot be delegated      Passed - Valid encounter within last 6 months    Recent Outpatient Visits           1 month ago Spastic hemiplegia of left dominant side as late effect of cerebral infarction [facility])   [facility], Netta Neat, DO   5 months ago Spastic hemiplegia of left dominant side as late effect of cerebral infarction [facility])   [facility], Netta Neat, DO   7 months ago Spastic hemiplegia of left dominant side as late effect of cerebral infarction [facility])   [facility], Netta Neat, DO   10 months ago Spastic hemiplegia of left dominant side as late effect of cerebral infarction [facility])   [facility] Smitty Cords, DO   1 year ago Spastic hemiplegia of left dominant side as late effect of cerebral infarction [facility])   [facility] [place], Netta Neat, DO                 Signed Prescriptions Disp Refills   amLODipine (NORVASC) 5 MG tablet 90 tablet 0    Sig: TAKE 1 TABLET BY MOUTH EVERY DAY      Cardiovascular:  Calcium Channel Blockers Passed - 12/10/2020 10:37 AM      Passed - Last BP in normal range    BP Readings from Last 1 Encounters:  11/09/20 110/66          Passed - Valid encounter within last 6 months     Recent Outpatient Visits           1 month ago Spastic hemiplegia of left dominant side as late effect of cerebral infarction [facility])   [facility], [facility]at, DO   5 months ago Spastic hemiplegia of left dominant side as late effect of cerebral infarction [facility])   [facility], Netta Neat, DO   7 months ago Spastic hemiplegia of left dominant side as late effect of cerebral infarction [facility])   [facility], Netta Neat, DO   10 months ago Spastic hemiplegia of left dominant side as late effect of cerebral infarction [facility])   [facility], Netta Neat, DO   1 year ago Spastic hemiplegia of left dominant side as late effect of cerebral infarction [facility])   [facility], Netta Neat, DO                  clopidogrel (PLAVIX) 75 MG tablet 90 tablet 0    Sig: TAKE 1 TABLET BY MOUTH EVERY DAY      Hematology: Antiplatelets - clopidogrel Failed - 12/10/2020 10:37 AM      Failed - Evaluate AST,  ALT within 2 months of therapy initiation.      Failed - AST in normal range and within 360 days    AST  Date Value Ref Range Status  12/20/2019 14 (L) 15 - 41 U/L Final   SGOT(AST)  Date Value Ref Range Status  09/26/2012 22 15 - 37 Unit/L Final          Failed - HCT in normal range and within 180 days    HCT  Date Value Ref Range Status  12/20/2019 41.9 39.0 - 52.0 % Final  01/12/2015 47.0 40.0 - 52.0 % Final          Failed - HGB in normal range and within 180 days    Hemoglobin  Date Value Ref Range Status  12/20/2019 14.1 13.0 - 17.0 g/dL Final   HGB  Date Value Ref Range Status  01/12/2015 15.3 13.0 - 18.0 g/dL Final          Failed - PLT in normal range and within 180 days    Platelets  Date Value Ref Range Status  12/20/2019 323 150 - 400 K/uL Final   Platelet  Date Value Ref Range Status  01/12/2015 273 150 - 440 x10 3/mm 3 Final           Passed - ALT in normal range and within 360 days    ALT  Date Value Ref Range Status  12/20/2019 17 0 - 44 U/L Final   SGPT (ALT)  Date Value Ref Range Status  09/26/2012 22 12 - 78 U/L Final          Passed - Valid encounter within last 6 months    Recent Outpatient Visits           1 month ago Spastic hemiplegia of left dominant side as late effect of cerebral infarction [facility])   [facility] DO   5 months ago Spastic hemiplegia of left dominant side as late effect of cerebral infarction [facility])   [facility], Netta Neat, DO   7 months ago Spastic hemiplegia of left dominant side as late effect of cerebral infarction [facility])   [facility], Netta Neat, DO   10 months ago Spastic hemiplegia of left dominant side as late effect of cerebral infarction [facility])   [facility], Netta Neat, DO   1 year ago Spastic hemiplegia of left dominant side as late effect of cerebral infarction [facility])   [facility], Netta Neat, DO                  potassium chloride (KLOR-CON) 10 MEQ tablet 180 tablet 0    Sig: TAKE 1 TABLET (10 MEQ TOTAL) BY MOUTH 2 (TWO) TIMES DAILY.      Endocrinology:  Minerals - Potassium Supplementation Passed - 12/10/2020 10:37 AM      Passed - K in normal range and within 360 days    Potassium  Date Value Ref Range Status  12/20/2019 3.9 3.5 - 5.1 mmol/L Final  01/12/2015 4.0 3.5 - 5.1 mmol/L Final          Passed - Cr in normal range and within 360 days    Creatinine  Date Value Ref Range Status  01/12/2015 0.97 0.60 - 1.30 mg/dL Final   Creatinine, Ser  Date Value Ref Range Status  12/20/2019 0.94 0.61 - 1.24 mg/dL Final  Passed - Valid encounter within last 12 months    Recent Outpatient Visits           1 month ago Spastic hemiplegia of left dominant side as late effect of  cerebral infarction [facility])   [facility], Netta Neat, DO   5 months ago Spastic hemiplegia of left dominant side as late effect of cerebral infarction [facility])   [facility] Smit[facility] Cords, DO   7 months ago Spastic hemiplegia of left dominant side as late effect of cerebral infarction [facility])   [facility], Netta Neat, DO   10 months ago Spastic hemiplegia of left dominant side as late effect of cerebral infarction [facility])   [facility] Smit[facility] C[facility], DO   1 year ago Spastic hemiplegia of left dominant side as late effect of cerebral infarction (HCC)   [facility], Netta Neat, DO                  losartan (COZAAR) 100 MG tablet 90 tablet 0    Sig: TAKE 1 TABLET BY MOUTH EVERY DAY      Cardiovascular:  Angiotensin Receptor Blockers Failed - 12/10/2020 10:37 AM      Failed - Cr in normal range and within 180 days    Creatinine  Date Value Ref Range Status  01/12/2015 0.97 0.60 - 1.30 mg/dL Final   Creatinine, Ser  Date Value Ref Range Status  12/20/2019 0.94 0.61 - 1.24 mg/dL Final          Failed - K in normal range and within 180 days    Potassium  Date Value Ref Range Status  12/20/2019 3.9 3.5 - 5.1 mmol/L Final  01/12/2015 4.0 3.5 - 5.1 mmol/L Final          Passed - Patient is not pregnant      Passed - Last BP in normal range    BP Readings from Last 1 Encounters:  11/09/20 110/66          Passed - Valid encounter within last 6 months    Recent Outpatient Visits           1 month ago Spastic hemiplegia of left dominant side as late effect of cerebral infarction [facility])   [facility] Smitty Cords, DO   5 months ago Spastic hemiplegia of left dominant side as late effect of cerebral infarction [facility])   [facility] Smitty Cords, DO   7 months ago Spastic hemiplegia of left  dominant side as late effect of cerebral infarction [facility])   [facility], Netta Neat, DO   10 months ago Spastic hemiplegia of left dominant side as late effect of cerebral infarction [facility])   [facility] Smitty Cords, DO   1 year ago Spastic hemiplegia of left dominant side as late effect of cerebral infarction [facility])   [facility], Netta Neat, DO

## 2021-01-04 ENCOUNTER — Other Ambulatory Visit: Payer: Self-pay | Admitting: Family Medicine

## 2021-01-04 DIAGNOSIS — I10 Essential (primary) hypertension: Secondary | ICD-10-CM

## 2021-01-09 ENCOUNTER — Other Ambulatory Visit: Payer: Self-pay | Admitting: Family Medicine

## 2021-01-09 DIAGNOSIS — E782 Mixed hyperlipidemia: Secondary | ICD-10-CM

## 2021-01-09 DIAGNOSIS — I6930 Unspecified sequelae of cerebral infarction: Secondary | ICD-10-CM

## 2021-01-12 ENCOUNTER — Ambulatory Visit: Payer: Medicare Other | Admitting: Family Medicine

## 2021-01-13 ENCOUNTER — Other Ambulatory Visit: Payer: Self-pay

## 2021-01-13 ENCOUNTER — Ambulatory Visit: Payer: Medicare Other | Admitting: Family Medicine

## 2021-01-13 DIAGNOSIS — I6930 Unspecified sequelae of cerebral infarction: Secondary | ICD-10-CM

## 2021-01-13 MED ORDER — CLOPIDOGREL BISULFATE 75 MG PO TABS: 75.0000 mg | ORAL_TABLET | Freq: Every day | ORAL | 0 refills | Status: DC

## 2021-03-01 ENCOUNTER — Other Ambulatory Visit: Payer: Self-pay | Admitting: Family Medicine

## 2021-03-01 DIAGNOSIS — I10 Essential (primary) hypertension: Secondary | ICD-10-CM

## 2021-03-01 MED ORDER — POTASSIUM CHLORIDE ER 10 MEQ PO TBCR: 10.0000 meq | EXTENDED_RELEASE_TABLET | Freq: Two times a day (BID) | ORAL | 0 refills | Status: DC

## 2021-03-01 NOTE — Telephone Encounter (Signed)
Medication Refill - Medication: potassium chloride (KLOR-CON) 10 MEQ tablet  Pt called and stated he needs a refill due to his muscle locking up on him/ please advise   Has the patient contacted their pharmacy? No. Pt is requesting this be sent to a new pharmacy   (Agent: If no, request that the patient contact the pharmacy for the refill.) (Agent: If yes, when and what did the pharmacy advise?)  Preferred Pharmacy (with phone number or street name):  [facility] [place] - [place], [place] - [address] Phone:  [phone]  Fax:  [phone]      Agent: Please be advised that RX refills may take up to 3 business days. We ask that you follow-up with your pharmacy.

## 2021-03-01 NOTE — Telephone Encounter (Signed)
Requested medication (s) are due for refill today:  yes  Requested medication (s) are on the active medication list: yes  Future visit scheduled:  no  Notes to clinic:Patient states that his muscles are cramping up Review for refill  Due for follow up on 03/09/2021    Requested Prescriptions  Pending Prescriptions Disp Refills   potassium chloride (KLOR-CON) 10 MEQ tablet 180 tablet 0    Sig: Take 1 tablet (10 mEq total) by mouth 2 (two) times daily.      Endocrinology:  Minerals - Potassium Supplementation Failed - 03/01/2021  8:22 AM      Failed - K in normal range and within 360 days    Potassium  Date Value Ref Range Status  12/20/2019 3.9 3.5 - 5.1 mmol/L Final  01/12/2015 4.0 3.5 - 5.1 mmol/L Final          Failed - Cr in normal range and within 360 days    Creatinine  Date Value Ref Range Status  01/12/2015 0.97 0.60 - 1.30 mg/dL Final   Creatinine, Ser  Date Value Ref Range Status  12/20/2019 0.94 0.61 - 1.24 mg/dL Final          Passed - Valid encounter within last 12 months    Recent Outpatient Visits           3 months ago Spastic hemiplegia of left dominant side as late effect of cerebral infarction [facility])   [facility]mit[facility]ords, DO   7 months ago Spastic hemiplegia of left dominant side as late effect of cerebral infarction [facility])   [facility], Netta Neat, DO   10 months ago Spastic hemiplegia of left dominant side as late effect of cerebral infarction [facility])   [facility], Netta Neat, DO   1 year ago Spastic hemiplegia of left dominant side as late effect of cerebral infarction [facility])   [facility] Smitty Cords, DO   1 year ago Spastic hemiplegia of left dominant side as late effect of cerebral infarction [facility])   [facility] [place], Netta Neat, DO

## 2021-03-10 ENCOUNTER — Other Ambulatory Visit: Payer: Self-pay | Admitting: Family Medicine

## 2021-03-10 DIAGNOSIS — E782 Mixed hyperlipidemia: Secondary | ICD-10-CM

## 2021-03-10 DIAGNOSIS — I6930 Unspecified sequelae of cerebral infarction: Secondary | ICD-10-CM

## 2021-03-10 NOTE — Telephone Encounter (Signed)
Patient needs office visit. Courtesy refill. Was to return for follow up around 02/2021. Requested Prescriptions  Pending Prescriptions Disp Refills  . atorvastatin (LIPITOR) 40 MG tablet [Pharmacy Med Name: ATORVASTATIN 40 MG TABLET] 30 tablet 0    Sig: TAKE 1 TABLET BY MOUTH EVERY DAY IN THE EVENING     Cardiovascular:  Antilipid - Statins Failed - 03/10/2021  1:27 AM      Failed - Total Cholesterol in normal range and within 360 days    No results found for: CHOL, POCCHOL, CHOLTOT       Failed - LDL in normal range and within 360 days    No results found for: LDLCALC, LDLC, HIRISKLDL, POCLDL, LDLDIRECT, REALLDLC, TOTLDLC       Failed - HDL in normal range and within 360 days    No results found for: HDL, POCHDL       Failed - Triglycerides in normal range and within 360 days    No results found for: TRIG, POCTRIG       Passed - Patient is not pregnant      Passed - Valid encounter within last 12 months    Recent Outpatient Visits          4 months ago Spastic hemiplegia of left dominant side as late effect of cerebral infarction [facility])   [facility], Netta Neat, DO   8 months ago Spastic hemiplegia of left dominant side as late effect of cerebral infarction [facility])   [facility], Netta Neat, DO   10 months ago Spastic hemiplegia of left dominant side as late effect of cerebral infarction [facility])   [facility] [place], Netta Neat, DO   1 year ago Spastic hemiplegia of left dominant side as late effect of cerebral infarction [facility])   [facility] Smitty Cords, DO   1 year ago Spastic hemiplegia of left dominant side as late effect of cerebral infarction [facility])   [facility], Netta Neat, DO

## 2021-03-17 ENCOUNTER — Telehealth: Payer: Self-pay

## 2021-03-17 NOTE — Telephone Encounter (Signed)
Copied from CRM [id number]. Topic: General - Other >> Mar 16, 2021  8:55 AM Gwenlyn Fudge wrote: Reason for CRM: Pt called and is requesting to have prescription for a wheelchair. Pt states that he would like to have this go through [facility]. Phone [phone]. Please advise.

## 2021-04-01 ENCOUNTER — Other Ambulatory Visit: Payer: Self-pay | Admitting: Family Medicine

## 2021-04-01 DIAGNOSIS — I10 Essential (primary) hypertension: Secondary | ICD-10-CM

## 2021-04-01 NOTE — Telephone Encounter (Signed)
Requested medication (s) are due for refill today: yes  Requested medication (s) are on the active medication list:  yes  Last refill:  12/28/2020  Future visit scheduled: no  Notes to clinic:  overdue for follow up appointment    Requested Prescriptions  Pending Prescriptions Disp Refills   potassium chloride (KLOR-CON) 10 MEQ tablet [Pharmacy Med Name: POTASSIUM CL ER 10 MEQ TABLET] 180 tablet 0    Sig: TAKE 1 TABLET BY MOUTH 2 TIMES DAILY.      Endocrinology:  Minerals - Potassium Supplementation Failed - 04/01/2021  2:45 PM      Failed - K in normal range and within 360 days    Potassium  Date Value Ref Range Status  12/20/2019 3.9 3.5 - 5.1 mmol/L Final  01/12/2015 4.0 3.5 - 5.1 mmol/L Final          Failed - Cr in normal range and within 360 days    Creatinine  Date Value Ref Range Status  01/12/2015 0.97 0.60 - 1.30 mg/dL Final   Creatinine, Ser  Date Value Ref Range Status  12/20/2019 0.94 0.61 - 1.24 mg/dL Final          Passed - Valid encounter within last 12 months    Recent Outpatient Visits           4 months ago Spastic hemiplegia of left dominant side as late effect of cerebral infarction [facility])   [facility] Smit[facility]ords, DO   8 months ago Spastic hemiplegia of left dominant side as late effect of cerebral infarction [facility])   [facility], Netta Neat, DO   11 months ago Spastic hemiplegia of left dominant side as late effect of cerebral infarction [facility])   [facility] [place], Netta Neat, DO   1 year ago Spastic hemiplegia of left dominant side as late effect of cerebral infarction [facility])   [facility] Smitty Cords, DO   1 year ago Spastic hemiplegia of left dominant side as late effect of cerebral infarction [facility])   [facility], Netta Neat, DO

## 2021-04-02 ENCOUNTER — Other Ambulatory Visit: Payer: Self-pay | Admitting: Family Medicine

## 2021-04-02 DIAGNOSIS — I10 Essential (primary) hypertension: Secondary | ICD-10-CM

## 2021-04-02 NOTE — Telephone Encounter (Signed)
Requested Prescriptions  Pending Prescriptions Disp Refills  . chlorthalidone (HYGROTON) 25 MG tablet [Pharmacy Med Name: CHLORTHALIDONE 25 MG TABLET] 90 tablet 0    Sig: TAKE 1 TABLET BY MOUTH EVERY DAY     Cardiovascular: Diuretics - Thiazide Failed - 04/02/2021  1:22 AM      Failed - Ca in normal range and within 360 days    Calcium  Date Value Ref Range Status  12/20/2019 8.9 8.9 - 10.3 mg/dL Final   Calcium, Total  Date Value Ref Range Status  01/12/2015 8.2 (L) 8.5 - 10.1 mg/dL Final         Failed - Cr in normal range and within 360 days    Creatinine  Date Value Ref Range Status  01/12/2015 0.97 0.60 - 1.30 mg/dL Final   Creatinine, Ser  Date Value Ref Range Status  12/20/2019 0.94 0.61 - 1.24 mg/dL Final         Failed - K in normal range and within 360 days    Potassium  Date Value Ref Range Status  12/20/2019 3.9 3.5 - 5.1 mmol/L Final  01/12/2015 4.0 3.5 - 5.1 mmol/L Final         Failed - Na in normal range and within 360 days    Sodium  Date Value Ref Range Status  12/20/2019 140 135 - 145 mmol/L Final  01/12/2015 138 136 - 145 mmol/L Final         Passed - Last BP in normal range    BP Readings from Last 1 Encounters:  11/09/20 110/66         Passed - Valid encounter within last 6 months    Recent Outpatient Visits          4 months ago Spastic hemiplegia of left dominant side as late effect of cerebral infarction [facility])   [facility] Smitty Cords, DO   8 months ago Spastic hemiplegia of left dominant side as late effect of cerebral infarction [facility])   [facility] Smitty Cords, DO   11 months ago Spastic hemiplegia of left dominant side as late effect of cerebral infarction [facility])   [facility] Smit[facility] Cords, DO   1 year ago Spastic hemiplegia of left dominant side as late effect of cerebral infarction [facility])   [facility] Smitty Cords, DO    1 year ago Spastic hemiplegia of left dominant side as late effect of cerebral infarction [facility])   [facility] [place], Netta Neat, DO

## 2021-04-13 ENCOUNTER — Other Ambulatory Visit: Payer: Self-pay | Admitting: Family Medicine

## 2021-04-13 DIAGNOSIS — I6930 Unspecified sequelae of cerebral infarction: Secondary | ICD-10-CM

## 2021-04-13 DIAGNOSIS — E782 Mixed hyperlipidemia: Secondary | ICD-10-CM

## 2021-04-13 NOTE — Telephone Encounter (Signed)
Requested medication (s) are due for refill today: no  Requested medication (s) are on the active medication list:  yes   Last refill: 03/10/2021  Future visit scheduled: no  Notes to clinic:  last 2 appointments were canceled or no showed  Review for refill    Requested Prescriptions  Pending Prescriptions Disp Refills   atorvastatin (LIPITOR) 40 MG tablet [Pharmacy Med Name: ATORVASTATIN 40 MG TABLET] 30 tablet 0    Sig: TAKE 1 TABLET BY MOUTH EVERY DAY IN THE EVENING      Cardiovascular:  Antilipid - Statins Failed - 04/13/2021 11:31 AM      Failed - Total Cholesterol in normal range and within 360 days    No results found for: CHOL, POCCHOL, CHOLTOT        Failed - LDL in normal range and within 360 days    No results found for: LDLCALC, LDLC, HIRISKLDL, POCLDL, LDLDIRECT, REALLDLC, TOTLDLC        Failed - HDL in normal range and within 360 days    No results found for: HDL, POCHDL        Failed - Triglycerides in normal range and within 360 days    No results found for: TRIG, POCTRIG        Passed - Patient is not pregnant      Passed - Valid encounter within last 12 months    Recent Outpatient Visits           5 months ago Spastic hemiplegia of left dominant side as late effect of cerebral infarction [facility])   [facility], Netta Neat, DO   9 months ago Spastic hemiplegia of left dominant side as late effect of cerebral infarction [facility])   [facility], Netta Neat, DO   11 months ago Spastic hemiplegia of left dominant side as late effect of cerebral infarction [facility])   [facility], Netta Neat, DO   1 year ago Spastic hemiplegia of left dominant side as late effect of cerebral infarction [facility])   [facility] Smitty Cords, DO   1 year ago Spastic hemiplegia of left dominant side as late effect of cerebral infarction [facility])   [facility]  [place], Netta Neat, DO

## 2021-04-19 ENCOUNTER — Other Ambulatory Visit: Payer: Self-pay

## 2021-04-29 ENCOUNTER — Other Ambulatory Visit: Payer: Self-pay | Admitting: Family Medicine

## 2021-04-29 DIAGNOSIS — I6930 Unspecified sequelae of cerebral infarction: Secondary | ICD-10-CM

## 2021-04-29 DIAGNOSIS — E782 Mixed hyperlipidemia: Secondary | ICD-10-CM

## 2021-04-29 NOTE — Telephone Encounter (Signed)
Courtesy refill. Pt needs to be seen in the office. Requested Prescriptions  Pending Prescriptions Disp Refills  . atorvastatin (LIPITOR) 40 MG tablet [Pharmacy Med Name: ATORVASTATIN 40 MG TABLET] 30 tablet 0    Sig: TAKE 1 TABLET BY MOUTH EVERY DAY IN THE EVENING     Cardiovascular:  Antilipid - Statins Failed - 04/29/2021  2:05 AM      Failed - Total Cholesterol in normal range and within 360 days    No results found for: CHOL, POCCHOL, CHOLTOT       Failed - LDL in normal range and within 360 days    No results found for: LDLCALC, LDLC, HIRISKLDL, POCLDL, LDLDIRECT, REALLDLC, TOTLDLC       Failed - HDL in normal range and within 360 days    No results found for: HDL, POCHDL       Failed - Triglycerides in normal range and within 360 days    No results found for: TRIG, POCTRIG       Passed - Patient is not pregnant      Passed - Valid encounter within last 12 months    Recent Outpatient Visits          5 months ago Spastic hemiplegia of left dominant side as late effect of cerebral infarction [facility])   [facility], Netta Neat, DO   9 months ago Spastic hemiplegia of left dominant side as late effect of cerebral infarction [facility])   [facility] [place], Netta Neat, DO   1 year ago Spastic hemiplegia of left dominant side as late effect of cerebral infarction [facility])   [facility] Smitty Cords, DO   1 year ago Spastic hemiplegia of left dominant side as late effect of cerebral infarction [facility])   [facility] Smitty Cords, DO   1 year ago Spastic hemiplegia of left dominant side as late effect of cerebral infarction [facility])   [facility] [place], Netta Neat, DO

## 2021-05-03 ENCOUNTER — Other Ambulatory Visit: Payer: Self-pay | Admitting: Family Medicine

## 2021-05-03 DIAGNOSIS — G4701 Insomnia due to medical condition: Secondary | ICD-10-CM

## 2021-05-03 DIAGNOSIS — F331 Major depressive disorder, recurrent, moderate: Secondary | ICD-10-CM

## 2021-05-03 MED ORDER — SERTRALINE HCL 50 MG PO TABS: 50.0000 mg | ORAL_TABLET | Freq: Every day | ORAL | 1 refills | Status: DC

## 2021-05-03 NOTE — Telephone Encounter (Signed)
Medication Refill - Medication: sertraline (ZOLOFT) 50 MG tablet    Has the patient contacted their pharmacy? No.   (Agent: If no, request that the patient contact the pharmacy for the refill.) Patient states new pharmacy and medication is not in there system   Preferred Pharmacy (with phone number or street name):  [facility] [place]. - [place], [place] - [address] Phone:  [phone]  Fax:  [phone]       Agent: Please be advised that RX refills may take up to 3 business days. We ask that you follow-up with your pharmacy.

## 2021-05-05 ENCOUNTER — Other Ambulatory Visit: Payer: Self-pay | Admitting: Family Medicine

## 2021-05-05 DIAGNOSIS — I6930 Unspecified sequelae of cerebral infarction: Secondary | ICD-10-CM

## 2021-05-05 DIAGNOSIS — E782 Mixed hyperlipidemia: Secondary | ICD-10-CM

## 2021-05-05 DIAGNOSIS — I10 Essential (primary) hypertension: Secondary | ICD-10-CM

## 2021-05-05 NOTE — Telephone Encounter (Signed)
Requested medication (s) are due for refill today:yes   Requested medication (s) are on the active medication list: yes  Last refill:  02/23/2021  Future visit scheduled: no  Notes to clinic:  Per last office note patient is due for a follow up 02/2021 Review for refills   Requested Prescriptions  Pending Prescriptions Disp Refills   amLODipine (NORVASC) 5 MG tablet [Pharmacy Med Name: AMLODIPINE BESYLATE 5 MG TAB] 90 tablet 0    Sig: TAKE 1 TABLET BY MOUTH ONCE DAILY.      Cardiovascular:  Calcium Channel Blockers Passed - 05/05/2021 12:51 PM      Passed - Last BP in normal range    BP Readings from Last 1 Encounters:  11/09/20 110/66          Passed - Valid encounter within last 6 months    Recent Outpatient Visits           5 months ago Spastic hemiplegia of left dominant side as late effect of cerebral infarction [facility])   [facility], Netta Neat, DO   10 months ago Spastic hemiplegia of left dominant side as late effect of cerebral infarction [facility])   [facility] [place], Netta Neat, DO   1 year ago Spastic hemiplegia of left dominant side as late effect of cerebral infarction [facility])   [facility] Netta Neat, DO   1 year ago Spastic hemiplegia of left dominant side as late effect of cerebral infarction [facility])   [facility], Netta Neat, DO   1 year ago Spastic hemiplegia of left dominant side as late effect of cerebral infarction [facility])   [facility], Netta Neat, DO                  clopidogrel (PLAVIX) 75 MG tablet [Pharmacy Med Name: CLOPIDOGREL 75 MG TABLET] 90 tablet 0    Sig: TAKE 1 TABLET BY MOUTH ONCE DAILY.      Hematology: Antiplatelets - clopidogrel Failed - 05/05/2021 12:51 PM      Failed - Evaluate AST, ALT within 2 months of therapy initiation.      Failed - ALT in normal range and within 360 days    ALT  Date Value  Ref Range Status  12/20/2019 17 0 - 44 U/L Final   SGPT (ALT)  Date Value Ref Range Status  09/26/2012 22 12 - 78 U/L Final          Failed - AST in normal range and within 360 days    AST  Date Value Ref Range Status  12/20/2019 14 (L) 15 - 41 U/L Final   SGOT(AST)  Date Value Ref Range Status  09/26/2012 22 15 - 37 Unit/L Final          Failed - HCT in normal range and within 180 days    HCT  Date Value Ref Range Status  12/20/2019 41.9 39.0 - 52.0 % Final  01/12/2015 47.0 40.0 - 52.0 % Final          Failed - HGB in normal range and within 180 days    Hemoglobin  Date Value Ref Range Status  12/20/2019 14.1 13.0 - 17.0 g/dL Final   HGB  Date Value Ref Range Status  01/12/2015 15.3 13.0 - 18.0 g/dL Final          Failed - PLT in normal range and within 180 days    Platelets  Date Value Ref Range Status  12/20/2019 323 150 - 400 K/uL Final   Platelet  Date Value Ref Range Status  01/12/2015 273 150 - 440 x10 3/mm 3 Final          Passed - Valid encounter within last 6 months    Recent Outpatient Visits           5 months ago Spastic hemiplegia of left dominant side as late effect of cerebral infarction [facility])   [facility], Netta Neat, DO   10 months ago Spastic hemiplegia of left dominant side as late effect of cerebral infarction (HCC)   [facility] Smitty Cords, DO   1 year ago Spastic hemiplegia of left dominant side as late effect of cerebral infarction (HCC)   [facility] Smit[facility] Cords, DO   1 year ago Spastic hemiplegia of left dominant side as late effect of cerebral infarction (HCC)   [facility] Smitty Cords, DO   1 year ago Spastic hemiplegia of left dominant side as late effect of cerebral infarction (HCC)   [facility], Netta Neat, DO                  atorvastatin (LIPITOR) 40 MG tablet  [Pharmacy Med Name: ATORVASTATIN 40 MG TABLET] 30 tablet 0    Sig: TAKE 1 TABLET BY MOUTH ONCE DAILY AT BEDTIME FOR CHOLESTEROL      Cardiovascular:  Antilipid - Statins Failed - 05/05/2021 12:51 PM      Failed - Total Cholesterol in normal range and within 360 days    No results found for: CHOL, POCCHOL, CHOLTOT        Failed - LDL in normal range and within 360 days    No results found for: LDLCALC, LDLC, HIRISKLDL, POCLDL, LDLDIRECT, REALLDLC, TOTLDLC        Failed - HDL in normal range and within 360 days    No results found for: HDL, POCHDL        Failed - Triglycerides in normal range and within 360 days    No results found for: TRIG, POCTRIG        Passed - Patient is not pregnant      Passed - Valid encounter within last 12 months    Recent Outpatient Visits           5 months ago Spastic hemiplegia of left dominant side as late effect of cerebral infarction [facility])   [facility], Netta Neat, DO   10 months ago Spastic hemiplegia of left dominant side as late effect of cerebral infarction [facility])   [facility] [place], Netta Neat, DO   1 year ago Spastic hemiplegia of left dominant side as late effect of cerebral infarction [facility])   [facility] Smitty Cords, DO   1 year ago Spastic hemiplegia of left dominant side as late effect of cerebral infarction [facility])   [facility] Smitty Cords, DO   1 year ago Spastic hemiplegia of left dominant side as late effect of cerebral infarction [facility])   [facility] [place], Netta Neat, DO

## 2021-05-25 ENCOUNTER — Other Ambulatory Visit: Payer: Self-pay | Admitting: Family Medicine

## 2021-05-25 DIAGNOSIS — I69352 Hemiplegia and hemiparesis following cerebral infarction affecting left dominant side: Secondary | ICD-10-CM

## 2021-05-25 NOTE — Telephone Encounter (Signed)
Requested medication (s) are due for refill today: yes  Requested medication (s) are on the active medication list: yes  Last refill:  04/24/2021  Future visit scheduled:no  Notes to clinic:  this refill cannot be delegated    Requested Prescriptions  Pending Prescriptions Disp Refills   methocarbamol (ROBAXIN) 500 MG tablet [Pharmacy Med Name: METHOCARBAMOL 500 MG TABLET] 120 tablet 0    Sig: TAKE (1) TABLET EVERY SIX HOURS AS NEEDED FOR MUSCLE SPASMS.    - MAY MAKE DROWSY -      Not Delegated - Analgesics:  Muscle Relaxants Failed - 05/25/2021 10:29 AM      Failed - This refill cannot be delegated      Failed - Valid encounter within last 6 months    Recent Outpatient Visits           6 months ago Spastic hemiplegia of left dominant side as late effect of cerebral infarction [facility])   [facility], Netta Neat, DO   10 months ago Spastic hemiplegia of left dominant side as late effect of cerebral infarction [facility])   [facility] Smitty Cords, DO   1 year ago Spastic hemiplegia of left dominant side as late effect of cerebral infarction [facility])   [facility] Smit[facility] Cords, DO   1 year ago Spastic hemiplegia of left dominant side as late effect of cerebral infarction [facility])   [facility] Smitty Cords, DO   1 year ago Spastic hemiplegia of left dominant side as late effect of cerebral infarction [facility])   [facility] [place], Netta Neat, DO

## 2021-06-17 ENCOUNTER — Other Ambulatory Visit: Payer: Self-pay | Admitting: Family Medicine

## 2021-06-17 DIAGNOSIS — G4701 Insomnia due to medical condition: Secondary | ICD-10-CM

## 2021-06-17 DIAGNOSIS — F331 Major depressive disorder, recurrent, moderate: Secondary | ICD-10-CM

## 2021-06-17 NOTE — Telephone Encounter (Signed)
  Notes to clinic:  courtesy refill has been given no appt has been scheduled   Requested Prescriptions  Pending Prescriptions Disp Refills   sertraline (ZOLOFT) 50 MG tablet [Pharmacy Med Name: SERTRALINE HCL 50 MG TABLET] 90 tablet 0    Sig: TAKE 1 TABLET BY MOUTH ONCE DAILY. MAY INCREASE TO 2 TABLETS DAILY IF NEEDED.      Psychiatry:  Antidepressants - SSRI Failed - 06/17/2021  7:47 AM      Failed - Valid encounter within last 6 months    Recent Outpatient Visits           7 months ago Spastic hemiplegia of left dominant side as late effect of cerebral infarction [facility])   [facility], Netta Neat, DO   11 months ago Spastic hemiplegia of left dominant side as late effect of cerebral infarction [facility])   [facility], Netta Neat, DO   1 year ago Spastic hemiplegia of left dominant side as late effect of cerebral infarction [facility])   [facility] Smitty Cords, DO   1 year ago Spastic hemiplegia of left dominant side as late effect of cerebral infarction [facility])   [facility] Smitty Cords, DO   1 year ago Spastic hemiplegia of left dominant side as late effect of cerebral infarction [facility])   [facility], Netta Neat, DO                Passed - Completed PHQ-2 or PHQ-9 in the last 360 days

## 2021-06-25 ENCOUNTER — Other Ambulatory Visit: Payer: Self-pay | Admitting: Family Medicine

## 2021-06-25 DIAGNOSIS — I10 Essential (primary) hypertension: Secondary | ICD-10-CM

## 2021-06-25 NOTE — Telephone Encounter (Signed)
   Notes to clinic:  review for refill Last appt was a no show    Requested Prescriptions  Pending Prescriptions Disp Refills   potassium chloride (KLOR-CON) 10 MEQ tablet [Pharmacy Med Name: POTASSIUM CL ER 10 MEQ TABLET] 180 tablet 0    Sig: TAKE 1 TABLET BY MOUTH TWICE DAILY      Endocrinology:  Minerals - Potassium Supplementation Failed - 06/25/2021  2:23 PM      Failed - K in normal range and within 360 days    Potassium  Date Value Ref Range Status  12/20/2019 3.9 3.5 - 5.1 mmol/L Final  01/12/2015 4.0 3.5 - 5.1 mmol/L Final          Failed - Cr in normal range and within 360 days    Creatinine  Date Value Ref Range Status  01/12/2015 0.97 0.60 - 1.30 mg/dL Final   Creatinine, Ser  Date Value Ref Range Status  12/20/2019 0.94 0.61 - 1.24 mg/dL Final          Passed - Valid encounter within last 12 months    Recent Outpatient Visits           7 months ago Spastic hemiplegia of left dominant side as late effect of cerebral infarction [facility])   [facility] Smitty Cords, DO   11 months ago Spastic hemiplegia of left dominant side as late effect of cerebral infarction [facility])   [facility] Smitty Cords, DO   1 year ago Spastic hemiplegia of left dominant side as late effect of cerebral infarction [facility])   [facility] Smitty Cords, DO   1 year ago Spastic hemiplegia of left dominant side as late effect of cerebral infarction [facility])   [facility] Smit[facility] Cords, DO   1 year ago Spastic hemiplegia of left dominant side as late effect of cerebral infarction [facility])   [facility] [place], Netta Neat, DO

## 2021-06-29 ENCOUNTER — Other Ambulatory Visit: Payer: Self-pay | Admitting: Family Medicine

## 2021-06-29 DIAGNOSIS — I10 Essential (primary) hypertension: Secondary | ICD-10-CM

## 2021-06-29 NOTE — Telephone Encounter (Signed)
Copied from CRM [id number]. Topic: Quick Communication - Rx Refill/Question >> Jun 29, 2021 11:31 AM Gaetana Michaelis A wrote: Medication: potassium chloride (KLOR-CON) 10 MEQ tablet   Has the patient contacted their pharmacy? Yes.   (Agent: If no, request that the patient contact the pharmacy for the refill.) (Agent: If yes, when and what did the pharmacy advise?)  Preferred Pharmacy (with phone number or street name): [facility] [place] - [place], [place] - [address]  Phone:  [phone] Fax:  [phone]  Agent: Please be advised that RX refills may take up to 3 business days. We ask that you follow-up with your pharmacy.

## 2021-06-29 NOTE — Telephone Encounter (Signed)
   Notes to clinic:  Patient states that he has been taking up to 4 a day sometimes for it work Review for change in script    Requested Prescriptions  Pending Prescriptions Disp Refills   potassium chloride (KLOR-CON) 10 MEQ tablet 180 tablet 0    Sig: Take 1 tablet (10 mEq total) by mouth 2 (two) times daily.      Endocrinology:  Minerals - Potassium Supplementation Failed - 06/29/2021 11:51 AM      Failed - K in normal range and within 360 days    Potassium  Date Value Ref Range Status  12/20/2019 3.9 3.5 - 5.1 mmol/L Final  01/12/2015 4.0 3.5 - 5.1 mmol/L Final          Failed - Cr in normal range and within 360 days    Creatinine  Date Value Ref Range Status  01/12/2015 0.97 0.60 - 1.30 mg/dL Final   Creatinine, Ser  Date Value Ref Range Status  12/20/2019 0.94 0.61 - 1.24 mg/dL Final          Passed - Valid encounter within last 12 months    Recent Outpatient Visits           7 months ago Spastic hemiplegia of left dominant side as late effect of cerebral infarction [facility])   [facility] Smitty Cords, DO   11 months ago Spastic hemiplegia of left dominant side as late effect of cerebral infarction [facility])   [facility] [place], Netta Neat, DO   1 year ago Spastic hemiplegia of left dominant side as late effect of cerebral infarction [facility])   [facility] Smitty Cords, DO   1 year ago Spastic hemiplegia of left dominant side as late effect of cerebral infarction [facility])   [facility] Smitty Cords, DO   1 year ago Spastic hemiplegia of left dominant side as late effect of cerebral infarction [facility])   [facility] [place], Netta Neat, DO

## 2021-06-30 MED ORDER — POTASSIUM CHLORIDE ER 10 MEQ PO TBCR: 10.0000 meq | EXTENDED_RELEASE_TABLET | Freq: Two times a day (BID) | ORAL | 0 refills | Status: DC

## 2021-07-03 ENCOUNTER — Other Ambulatory Visit: Payer: Self-pay | Admitting: Family Medicine

## 2021-07-03 DIAGNOSIS — I10 Essential (primary) hypertension: Secondary | ICD-10-CM

## 2021-07-03 DIAGNOSIS — E782 Mixed hyperlipidemia: Secondary | ICD-10-CM

## 2021-07-03 DIAGNOSIS — I6930 Unspecified sequelae of cerebral infarction: Secondary | ICD-10-CM

## 2021-07-03 NOTE — Telephone Encounter (Signed)
No future appointment. Atorvastatin approved per protocol.  LOV 11/09/20. Losartan 100 mg last ordered 04/01/21 with note stating must schedule appointment for further refills. TC to patient-no answer and unable to leave VM at this time. Routing Losartan request to pcp for consideration.

## 2021-07-03 NOTE — Telephone Encounter (Signed)
Requested medication (s) are due for refill today  Yes  Requested medication (s) are on the active medication list  Yes  Future visit scheduled No, Attempted to reach patient-no answer and could not leave VM.  Note to clinic-last order reads patient needs appointment before further refills.   Requested Prescriptions  Pending Prescriptions Disp Refills   losartan (COZAAR) 100 MG tablet [Pharmacy Med Name: LOSARTAN POTASSIUM 100 MG TAB] 90 tablet 0    Sig: TAKE 1 TABLET BY MOUTH ONCE DAILY.      Cardiovascular:  Angiotensin Receptor Blockers Failed - 07/03/2021 10:47 AM      Failed - Cr in normal range and within 180 days    Creatinine  Date Value Ref Range Status  01/12/2015 0.97 0.60 - 1.30 mg/dL Final   Creatinine, Ser  Date Value Ref Range Status  12/20/2019 0.94 0.61 - 1.24 mg/dL Final          Failed - K in normal range and within 180 days    Potassium  Date Value Ref Range Status  12/20/2019 3.9 3.5 - 5.1 mmol/L Final  01/12/2015 4.0 3.5 - 5.1 mmol/L Final          Failed - Valid encounter within last 6 months    Recent Outpatient Visits           7 months ago Spastic hemiplegia of left dominant side as late effect of cerebral infarction [facility])   [facility]   11 months ago Spastic hemiplegia of left dominant side as late effect of cerebral infarction (HCC)   [facility] [place], Netta Neat, DO   1 year ago Spastic hemiplegia of left dominant side as late effect of cerebral infarction (HCC)   [facility] Smitty Cords, DO   1 year ago Spastic hemiplegia of left dominant side as late effect of cerebral infarction (HCC)   [facility] Smitty Cords, DO   1 year ago Spastic hemiplegia of left dominant side as late effect of cerebral infarction [facility])   [facility], Netta Neat, DO                Passed - Patient is  not pregnant      Passed - Last BP in normal range    BP Readings from Last 1 Encounters:  11/09/20 110/66           Signed Prescriptions Disp Refills   atorvastatin (LIPITOR) 40 MG tablet 30 tablet 0    Sig: TAKE 1 TABLET BY MOUTH ONCE DAILY AT BEDTIME FOR CHOLESTEROL      Cardiovascular:  Antilipid - Statins Failed - 07/03/2021 10:47 AM      Failed - Total Cholesterol in normal range and within 360 days    No results found for: CHOL, POCCHOL, CHOLTOT        Failed - LDL in normal range and within 360 days    No results found for: LDLCALC, LDLC, HIRISKLDL, POCLDL, LDLDIRECT, REALLDLC, TOTLDLC        Failed - HDL in normal range and within 360 days    No results found for: HDL, POCHDL        Failed - Triglycerides in normal range and within 360 days    No results found for: TRIG, POCTRIG        Passed - Patient is not pregnant      Passed - Valid encounter  within last 12 months    Recent Outpatient Visits           7 months ago Spastic hemiplegia of left dominant side as late effect of cerebral infarction [facility])   [facility], Netta Neat, DO   11 months ago Spastic hemiplegia of left dominant side as late effect of cerebral infarction [facility])   [facility] Smitty Cords, DO   1 year ago Spastic hemiplegia of left dominant side as late effect of cerebral infarction [facility])   [facility] Smitty Cords, DO   1 year ago Spastic hemiplegia of left dominant side as late effect of cerebral infarction [facility])   [facility] Smitty Cords, DO   1 year ago Spastic hemiplegia of left dominant side as late effect of cerebral infarction [facility])   [facility] [place], Netta Neat, DO

## 2021-07-30 ENCOUNTER — Other Ambulatory Visit: Payer: Self-pay

## 2021-07-30 DIAGNOSIS — I10 Essential (primary) hypertension: Secondary | ICD-10-CM

## 2021-07-30 MED ORDER — LOSARTAN POTASSIUM 100 MG PO TABS: 100.0000 mg | ORAL_TABLET | Freq: Every day | ORAL | 0 refills | Status: DC

## 2021-08-03 ENCOUNTER — Telehealth: Payer: Self-pay

## 2021-08-03 NOTE — Telephone Encounter (Signed)
This nurse called patient in order to do telephonic AWV. The call was continuously breaking up and I was unable to hear patient. The call finally dropped and after numerous attempts the call was not answered and there was no way to leave a voicemail. We will have to reschedule for another time.

## 2021-08-03 NOTE — Progress Notes (Signed)
Patient phone kept shutting off and could not contact him again after many tries.    This encounter was created in error - please disregard.

## 2021-08-04 ENCOUNTER — Other Ambulatory Visit: Payer: Self-pay | Admitting: Family Medicine

## 2021-08-04 DIAGNOSIS — I10 Essential (primary) hypertension: Secondary | ICD-10-CM

## 2021-08-04 NOTE — Telephone Encounter (Signed)
Appt scheduled for 08/10/21. Courtesy refill provided

## 2021-08-05 ENCOUNTER — Other Ambulatory Visit: Payer: Self-pay

## 2021-08-05 DIAGNOSIS — I6930 Unspecified sequelae of cerebral infarction: Secondary | ICD-10-CM

## 2021-08-05 DIAGNOSIS — E782 Mixed hyperlipidemia: Secondary | ICD-10-CM

## 2021-08-05 MED ORDER — ATORVASTATIN CALCIUM 40 MG PO TABS
ORAL_TABLET | ORAL | 3 refills | Status: DC
Start: 2021-08-05 — End: 2021-12-10

## 2021-08-10 ENCOUNTER — Ambulatory Visit: Payer: Self-pay | Admitting: Family Medicine

## 2021-08-23 ENCOUNTER — Telehealth: Payer: Medicare Other

## 2021-08-23 NOTE — Telephone Encounter (Signed)
Incoming request from [facility] pharmacy for refill on tadalafil. RX denied as pt is overdue for appointment. Denial faxed.

## 2021-08-31 ENCOUNTER — Other Ambulatory Visit: Payer: Self-pay | Admitting: Urology

## 2021-09-07 ENCOUNTER — Encounter: Payer: Self-pay | Admitting: Urology

## 2021-09-07 ENCOUNTER — Ambulatory Visit (INDEPENDENT_AMBULATORY_CARE_PROVIDER_SITE_OTHER): Payer: Medicare Other | Admitting: Urology

## 2021-09-07 ENCOUNTER — Other Ambulatory Visit: Payer: Self-pay

## 2021-09-07 VITALS — BP 100/66 | HR 66 | Ht 66.0 in | Wt 202.0 lb

## 2021-09-07 DIAGNOSIS — N401 Enlarged prostate with lower urinary tract symptoms: Secondary | ICD-10-CM | POA: Diagnosis not present

## 2021-09-07 DIAGNOSIS — R3914 Feeling of incomplete bladder emptying: Secondary | ICD-10-CM

## 2021-09-07 DIAGNOSIS — N529 Male erectile dysfunction, unspecified: Secondary | ICD-10-CM

## 2021-09-07 DIAGNOSIS — N481 Balanitis: Secondary | ICD-10-CM

## 2021-09-07 MED ORDER — TAMSULOSIN HCL 0.4 MG PO CAPS: 0.4000 mg | ORAL_CAPSULE | Freq: Every day | ORAL | 11 refills | Status: DC

## 2021-09-07 MED ORDER — TADALAFIL 10 MG PO TABS: 10.0000 mg | ORAL_TABLET | Freq: Every day | ORAL | 11 refills | Status: DC | PRN

## 2021-09-07 MED ORDER — TAMSULOSIN HCL 0.4 MG PO CAPS
0.4000 mg | ORAL_CAPSULE | Freq: Every day | ORAL | 11 refills | Status: DC
Start: 2021-09-07 — End: 2022-05-12

## 2021-09-07 MED ORDER — NYSTATIN-TRIAMCINOLONE 100000-0.1 UNIT/GM-% EX OINT: 1.0000 "application " | TOPICAL_OINTMENT | Freq: Two times a day (BID) | CUTANEOUS | 0 refills | Status: DC

## 2021-09-07 MED ORDER — NYSTATIN-TRIAMCINOLONE 100000-0.1 UNIT/GM-% EX OINT
1.0000 | TOPICAL_OINTMENT | Freq: Two times a day (BID) | CUTANEOUS | 0 refills | Status: DC
Start: 2021-09-07 — End: 2023-03-14

## 2021-09-07 MED ORDER — TADALAFIL 20 MG PO TABS: 20.0000 mg | ORAL_TABLET | Freq: Every day | ORAL | 11 refills | Status: DC | PRN

## 2021-09-07 NOTE — Progress Notes (Signed)
   09/07/2021 11:47 AM   York Spaniel Jun 08, 1971 [id number]  Reason for visit: Erectile dysfunction, urinary symptoms, penile rash  HPI: I saw Mr. Snowdon for the above issues.  He is a very comorbid 50 year old male with history of stroke with significant residual deficits who uses a power wheelchair.  I saw him last in July 2021 for ED and we trialed Cialis 10 mg on demand for ED.  I also recommended 6-week follow-up with a renal ultrasound to rule out hydronephrosis and possibility of neurogenic bladder as he was having some mild incontinence at that time.  He did not follow-up.  He did notice some improvement on the Cialis 10 mg on demand, but it does not always work.  He is interested in what other options he may have.  He also has noted improvement in his urinary incontinence, but he does occasionally have some intermittency or weak stream.  He also reports some irritation at the head of the penis with some redness and tenderness.  On exam he has an uncircumcised phallus with mild balanitis, no other lesions.  I recommended increasing the Cialis dose to 20 mg on demand for his ED.  We also discussed this could be taken every other day.  Recommended Mycolog cream twice daily for 2 weeks for balanitis  Trial of Flomax 0.4 mg nightly for BPH urinary symptoms, again encouraged renal ultrasound to rule out hydronephrosis and incomplete emptying, will check PVR at our next visit   Sondra Come, MD  [facility] [address], [address] [place], [place] [address] [phone]

## 2021-09-17 ENCOUNTER — Telehealth: Payer: Self-pay | Admitting: *Deleted

## 2021-09-17 ENCOUNTER — Other Ambulatory Visit: Payer: Self-pay | Admitting: Family Medicine

## 2021-09-17 DIAGNOSIS — F331 Major depressive disorder, recurrent, moderate: Secondary | ICD-10-CM

## 2021-09-17 DIAGNOSIS — G4701 Insomnia due to medical condition: Secondary | ICD-10-CM

## 2021-09-17 DIAGNOSIS — I10 Essential (primary) hypertension: Secondary | ICD-10-CM

## 2021-09-17 NOTE — Telephone Encounter (Signed)
I called pt and made an appt with Dr. Althea Charon for 09/28/2021 at 1:20 in office visit for his 6 mo. Check up and refills.  I gave him a 30 refill on the Zoloft and amlodipine.

## 2021-09-27 ENCOUNTER — Other Ambulatory Visit: Payer: Self-pay | Admitting: Family Medicine

## 2021-09-27 DIAGNOSIS — E782 Mixed hyperlipidemia: Secondary | ICD-10-CM

## 2021-09-27 DIAGNOSIS — I6930 Unspecified sequelae of cerebral infarction: Secondary | ICD-10-CM

## 2021-09-28 ENCOUNTER — Ambulatory Visit: Payer: Medicare Other | Admitting: Family Medicine

## 2021-09-28 ENCOUNTER — Other Ambulatory Visit: Payer: Self-pay

## 2021-09-28 DIAGNOSIS — I10 Essential (primary) hypertension: Secondary | ICD-10-CM

## 2021-09-28 MED ORDER — POTASSIUM CHLORIDE ER 10 MEQ PO TBCR: 10.0000 meq | EXTENDED_RELEASE_TABLET | Freq: Two times a day (BID) | ORAL | 0 refills | Status: DC

## 2021-09-28 NOTE — Telephone Encounter (Signed)
Pt has appt today- routing to office. Pt has blood work overdue.

## 2021-10-22 ENCOUNTER — Other Ambulatory Visit: Payer: Self-pay | Admitting: Family Medicine

## 2021-10-22 DIAGNOSIS — I10 Essential (primary) hypertension: Secondary | ICD-10-CM

## 2021-10-22 NOTE — Telephone Encounter (Signed)
Pt needs appt for additional refills

## 2021-10-23 NOTE — Telephone Encounter (Signed)
Called pt to make appt- pt stated he will call back to make appt. Last RF 07/30/21 #90  Active med RF due Requested Prescriptions  Pending Prescriptions Disp Refills   losartan (COZAAR) 100 MG tablet [Pharmacy Med Name: LOSARTAN POTASSIUM 100 MG TAB] 90 tablet 3    Sig: TAKE 1 TABLET BY MOUTH ONCE DAILY.     Cardiovascular:  Angiotensin Receptor Blockers Failed - 10/22/2021  9:58 AM      Failed - Cr in normal range and within 180 days    Creatinine  Date Value Ref Range Status  01/12/2015 0.97 0.60 - 1.30 mg/dL Final   Creatinine, Ser  Date Value Ref Range Status  12/20/2019 0.94 0.61 - 1.24 mg/dL Final          Failed - K in normal range and within 180 days    Potassium  Date Value Ref Range Status  12/20/2019 3.9 3.5 - 5.1 mmol/L Final  01/12/2015 4.0 3.5 - 5.1 mmol/L Final          Failed - Valid encounter within last 6 months    Recent Outpatient Visits           11 months ago Spastic hemiplegia of left dominant side as late effect of cerebral infarction [facility])   [facility] Smitty Cords, DO   1 year ago Spastic hemiplegia of left dominant side as late effect of cerebral infarction [facility])   [facility], Netta Neat, DO   1 year ago Spastic hemiplegia of left dominant side as late effect of cerebral infarction [facility])   [facility] Smitty Cords, DO   1 year ago Spastic hemiplegia of left dominant side as late effect of cerebral infarction [facility])   [facility], Netta Neat, DO   2 years ago Spastic hemiplegia of left dominant side as late effect of cerebral infarction [facility])   [facility], Netta Neat, DO              Passed - Patient is not pregnant      Passed - Last BP in normal range    BP Readings from Last 1 Encounters:  09/07/21 100/66

## 2021-11-11 ENCOUNTER — Other Ambulatory Visit: Payer: Self-pay | Admitting: Family Medicine

## 2021-11-11 DIAGNOSIS — E782 Mixed hyperlipidemia: Secondary | ICD-10-CM

## 2021-11-11 DIAGNOSIS — I10 Essential (primary) hypertension: Secondary | ICD-10-CM

## 2021-11-11 DIAGNOSIS — I6930 Unspecified sequelae of cerebral infarction: Secondary | ICD-10-CM

## 2021-11-11 NOTE — Telephone Encounter (Signed)
Requested medications are due for refill today.  yes  Requested medications are on the active medications list.  yes  Last refill. 08/05/2021  Future visit scheduled.   no  Notes to clinic.  Missing or expired labs. Pt last seen in office 07/09/2020. More than 3 months overdue for office visit.

## 2021-11-12 NOTE — Telephone Encounter (Signed)
Requested medications are due for refill today requesting potassium early,   Requested medications are on the active medication list yes  Last refill 11/11/21 for potassium, 09/20/21 for Cozaar (mail order)  Last visit 11/09/20, labs are from 11/2019  Future visit scheduled no  Notes to clinic failed protocol of labs and office visit frequency, duplicate request for statin, please assess. Requested Prescriptions  Pending Prescriptions Disp Refills   potassium chloride (KLOR-CON) 10 MEQ tablet [Pharmacy Med Name: Potassium Chloride ER 10 MEQ Oral Tablet Extended Release] 180 tablet 3    Sig: TAKE 1 TABLET BY MOUTH TWICE  DAILY     Endocrinology:  Minerals - Potassium Supplementation Failed - 11/11/2021  9:39 PM      Failed - K in normal range and within 360 days    Potassium  Date Value Ref Range Status  12/20/2019 3.9 3.5 - 5.1 mmol/L Final  01/12/2015 4.0 3.5 - 5.1 mmol/L Final          Failed - Cr in normal range and within 360 days    Creatinine  Date Value Ref Range Status  01/12/2015 0.97 0.60 - 1.30 mg/dL Final   Creatinine, Ser  Date Value Ref Range Status  12/20/2019 0.94 0.61 - 1.24 mg/dL Final          Failed - Valid encounter within last 12 months    Recent Outpatient Visits           1 year ago Spastic hemiplegia of left dominant side as late effect of cerebral infarction (HCC)   [facility] Smitty Cords, DO   1 year ago Spastic hemiplegia of left dominant side as late effect of cerebral infarction (HCC)   [facility] Smitty Cords, DO   1 year ago Spastic hemiplegia of left dominant side as late effect of cerebral infarction (HCC)   [facility] [place], Netta Neat, DO   1 year ago Spastic hemiplegia of left dominant side as late effect of cerebral infarction (HCC)   [facility] [place], Netta Neat, DO   2 years ago Spastic hemiplegia of left dominant side as  late effect of cerebral infarction (HCC)   [facility], Netta Neat, DO               atorvastatin (LIPITOR) 40 MG tablet [Pharmacy Med Name: Atorvastatin Calcium 40 MG Oral Tablet] 30 tablet 11    Sig: TAKE 1 TABLET BY MOUTH ONCE DAILY AT BEDTIME FOR CHOLESTEROL     Cardiovascular:  Antilipid - Statins Failed - 11/11/2021  9:39 PM      Failed - Total Cholesterol in normal range and within 360 days    No results found for: CHOL, POCCHOL, CHOLTOT        Failed - LDL in normal range and within 360 days    No results found for: LDLCALC, LDLC, HIRISKLDL, POCLDL, LDLDIRECT, REALLDLC, TOTLDLC        Failed - HDL in normal range and within 360 days    No results found for: HDL, POCHDL        Failed - Triglycerides in normal range and within 360 days    No results found for: TRIG, POCTRIG        Failed - Valid encounter within last 12 months    Recent Outpatient Visits           1 year ago Spastic hemiplegia of left dominant side as  late effect of cerebral infarction [facility])   [facility], Netta Neat, DO   1 year ago Spastic hemiplegia of left dominant side as late effect of cerebral infarction [facility])   [facility] [place], Netta Neat, DO   1 year ago Spastic hemiplegia of left dominant side as late effect of cerebral infarction [facility])   [facility] Smitty Cords, DO   1 year ago Spastic hemiplegia of left dominant side as late effect of cerebral infarction [facility])   [facility] Smitty Cords, DO   2 years ago Spastic hemiplegia of left dominant side as late effect of cerebral infarction [facility])   [facility], Netta Neat, DO              Passed - Patient is not pregnant       losartan (COZAAR) 100 MG tablet [Pharmacy Med Name: Losartan Potassium 100 MG Oral Tablet] 90 tablet 3    Sig: TAKE 1 TABLET BY MOUTH  DAILY      Cardiovascular:  Angiotensin Receptor Blockers Failed - 11/11/2021  9:39 PM      Failed - Cr in normal range and within 180 days    Creatinine  Date Value Ref Range Status  01/12/2015 0.97 0.60 - 1.30 mg/dL Final   Creatinine, Ser  Date Value Ref Range Status  12/20/2019 0.94 0.61 - 1.24 mg/dL Final          Failed - K in normal range and within 180 days    Potassium  Date Value Ref Range Status  12/20/2019 3.9 3.5 - 5.1 mmol/L Final  01/12/2015 4.0 3.5 - 5.1 mmol/L Final          Failed - Valid encounter within last 6 months    Recent Outpatient Visits           1 year ago Spastic hemiplegia of left dominant side as late effect of cerebral infarction [facility])   [facility] Smitty Cords, DO   1 year ago Spastic hemiplegia of left dominant side as late effect of cerebral infarction [facility])   [facility] Smitty Cords, DO   1 year ago Spastic hemiplegia of left dominant side as late effect of cerebral infarction [facility])   [facility], Netta Neat, DO   1 year ago Spastic hemiplegia of left dominant side as late effect of cerebral infarction [facility])   [facility], Netta Neat, DO   2 years ago Spastic hemiplegia of left dominant side as late effect of cerebral infarction [facility])   [facility], Netta Neat, DO              Passed - Patient is not pregnant      Passed - Last BP in normal range    BP Readings from Last 1 Encounters:  09/07/21 100/66

## 2021-11-14 ENCOUNTER — Other Ambulatory Visit: Payer: Self-pay | Admitting: Family Medicine

## 2021-11-14 DIAGNOSIS — I6930 Unspecified sequelae of cerebral infarction: Secondary | ICD-10-CM

## 2021-11-14 DIAGNOSIS — I10 Essential (primary) hypertension: Secondary | ICD-10-CM

## 2021-11-14 DIAGNOSIS — E782 Mixed hyperlipidemia: Secondary | ICD-10-CM

## 2021-11-15 NOTE — Telephone Encounter (Signed)
Requested medication (s) are due for refill today:   Yes  Requested medication (s) are on the active medication list:   Yes  Future visit scheduled:   No  Pt has been a No Show 08/10/2021, 09/28/2021, and has cancelled his appt for 08/09/2022.   Last ordered: Losartan 07/30/2021 #90, 0 refills;   atorvastatin 08/05/2021 #30, 3 refills  Returned because pt is not keeping his appts and has cancelled the one for 2023.   Provider to review.   Requested Prescriptions  Pending Prescriptions Disp Refills   losartan (COZAAR) 100 MG tablet [Pharmacy Med Name: Losartan Potassium 100 MG Oral Tablet] 90 tablet 3    Sig: TAKE 1 TABLET BY MOUTH  DAILY     Cardiovascular:  Angiotensin Receptor Blockers Failed - 11/14/2021  9:50 PM      Failed - Cr in normal range and within 180 days    Creatinine  Date Value Ref Range Status  01/12/2015 0.97 0.60 - 1.30 mg/dL Final   Creatinine, Ser  Date Value Ref Range Status  12/20/2019 0.94 0.61 - 1.24 mg/dL Final          Failed - K in normal range and within 180 days    Potassium  Date Value Ref Range Status  12/20/2019 3.9 3.5 - 5.1 mmol/L Final  01/12/2015 4.0 3.5 - 5.1 mmol/L Final          Failed - Valid encounter within last 6 months    Recent Outpatient Visits           1 year ago Spastic hemiplegia of left dominant side as late effect of cerebral infarction [facility])   [facility] Smitty Cords, DO   1 year ago Spastic hemiplegia of left dominant side as late effect of cerebral infarction (HCC)   [facility] Smitty Cords, DO   1 year ago Spastic hemiplegia of left dominant side as late effect of cerebral infarction (HCC)   [facility], Netta Neat, DO   1 year ago Spastic hemiplegia of left dominant side as late effect of cerebral infarction (HCC)   [facility] [place], Netta Neat, DO   2 years ago Spastic hemiplegia of left dominant side as  late effect of cerebral infarction [facility])   [facility] [place], Netta Neat, DO              Passed - Patient is not pregnant      Passed - Last BP in normal range    BP Readings from Last 1 Encounters:  09/07/21 100/66           atorvastatin (LIPITOR) 40 MG tablet [Pharmacy Med Name: Atorvastatin Calcium 40 MG Oral Tablet] 30 tablet 11    Sig: TAKE 1 TABLET BY MOUTH ONCE DAILY AT BEDTIME FOR CHOLESTEROL     Cardiovascular:  Antilipid - Statins Failed - 11/14/2021  9:50 PM      Failed - Total Cholesterol in normal range and within 360 days    No results found for: CHOL, POCCHOL, CHOLTOT        Failed - LDL in normal range and within 360 days    No results found for: LDLCALC, LDLC, HIRISKLDL, POCLDL, LDLDIRECT, REALLDLC, TOTLDLC        Failed - HDL in normal range and within 360 days    No results found for: HDL, POCHDL        Failed - Triglycerides in  normal range and within 360 days    No results found for: TRIG, POCTRIG        Failed - Valid encounter within last 12 months    Recent Outpatient Visits           1 year ago Spastic hemiplegia of left dominant side as late effect of cerebral infarction [facility])   [facility] [place], Netta Neat, DO   1 year ago Spastic hemiplegia of left dominant side as late effect of cerebral infarction [facility])   [facility] DO   1 year ago Spastic hemiplegia of left dominant side as late effect of cerebral infarction [facility])   [facility] Smitty Cords, DO   1 year ago Spastic hemiplegia of left dominant side as late effect of cerebral infarction [facility])   [facility] Smit[facility] Cords, DO   2 years ago Spastic hemiplegia of left dominant side as late effect of cerebral infarction [facility])   [facility], Netta Neat, DO              Passed - Patient is not pregnant

## 2021-11-21 ENCOUNTER — Other Ambulatory Visit: Payer: Self-pay | Admitting: Family Medicine

## 2021-11-21 DIAGNOSIS — E782 Mixed hyperlipidemia: Secondary | ICD-10-CM

## 2021-11-21 DIAGNOSIS — I6930 Unspecified sequelae of cerebral infarction: Secondary | ICD-10-CM

## 2021-11-23 NOTE — Telephone Encounter (Signed)
Requested medication (s) are due for refill today:   Yes  Requested medication (s) are on the active medication list:   Yes  Future visit scheduled:   No  09/28/2021 No Show,  He has cancelled appt for 08/09/2022.  This medication has been refused 3 prior times due to him not calling in for an appt.   Last ordered: 08/05/2021 #30, 3 refills  Returned for provider review for refills since he hasn't been seen.       Requested Prescriptions  Pending Prescriptions Disp Refills   atorvastatin (LIPITOR) 40 MG tablet [Pharmacy Med Name: Atorvastatin Calcium 40 MG Oral Tablet] 30 tablet 11    Sig: TAKE 1 TABLET BY MOUTH ONCE DAILY AT BEDTIME FOR CHOLESTEROL     Cardiovascular:  Antilipid - Statins Failed - 11/21/2021  5:53 PM      Failed - Total Cholesterol in normal range and within 360 days    No results found for: CHOL, POCCHOL, CHOLTOT        Failed - LDL in normal range and within 360 days    No results found for: LDLCALC, LDLC, HIRISKLDL, POCLDL, LDLDIRECT, REALLDLC, TOTLDLC        Failed - HDL in normal range and within 360 days    No results found for: HDL, POCHDL        Failed - Triglycerides in normal range and within 360 days    No results found for: TRIG, POCTRIG        Failed - Valid encounter within last 12 months    Recent Outpatient Visits           1 year ago Spastic hemiplegia of left dominant side as late effect of cerebral infarction [facility])   [facility] Smitty Cords, DO   1 year ago Spastic hemiplegia of left dominant side as late effect of cerebral infarction [facility])   [facility] [place], Netta Neat, DO   1 year ago Spastic hemiplegia of left dominant side as late effect of cerebral infarction [facility])   [facility] Smitty Cords, DO   1 year ago Spastic hemiplegia of left dominant side as late effect of cerebral infarction [facility])   [facility] Smitty Cords, DO    2 years ago Spastic hemiplegia of left dominant side as late effect of cerebral infarction [facility])   [facility], Netta Neat, DO              Passed - Patient is not pregnant

## 2021-11-23 NOTE — Telephone Encounter (Signed)
Please notify patient that we cannot refill medicines since it has been >1 year since his last visit. He has had several no shows / cancellations. If further issues - we may no longer be able to see him going forward.  Saralyn Pilar, DO [facility] 11/23/2021, 1:38 PM

## 2021-11-30 ENCOUNTER — Other Ambulatory Visit: Payer: Self-pay | Admitting: Family Medicine

## 2021-11-30 DIAGNOSIS — E782 Mixed hyperlipidemia: Secondary | ICD-10-CM

## 2021-11-30 DIAGNOSIS — I6930 Unspecified sequelae of cerebral infarction: Secondary | ICD-10-CM

## 2021-11-30 DIAGNOSIS — I10 Essential (primary) hypertension: Secondary | ICD-10-CM

## 2021-11-30 NOTE — Telephone Encounter (Signed)
Requested medications are due for refill today.  yes  Requested medications are on the active medications list.  yes  Last refill. Statin - 08/05/2021, Losartan - 07/30/2021, Kcl - 09/28/2021  Future visit scheduled.   no  Notes to clinic.  Pt last seen 11/09/2020. Pt has cancelled several appts. More than 3 months overdue for OV.    Requested Prescriptions  Pending Prescriptions Disp Refills   atorvastatin (LIPITOR) 40 MG tablet [Pharmacy Med Name: Atorvastatin Calcium 40 MG Oral Tablet] 30 tablet 11    Sig: TAKE 1 TABLET BY MOUTH ONCE DAILY AT BEDTIME FOR CHOLESTEROL     Cardiovascular:  Antilipid - Statins Failed - 11/30/2021  5:35 PM      Failed - Total Cholesterol in normal range and within 360 days    No results found for: CHOL, POCCHOL, CHOLTOT        Failed - LDL in normal range and within 360 days    No results found for: LDLCALC, LDLC, HIRISKLDL, POCLDL, LDLDIRECT, REALLDLC, TOTLDLC        Failed - HDL in normal range and within 360 days    No results found for: HDL, POCHDL        Failed - Triglycerides in normal range and within 360 days    No results found for: TRIG, POCTRIG        Failed - Valid encounter within last 12 months    Recent Outpatient Visits           1 year ago Spastic hemiplegia of left dominant side as late effect of cerebral infarction [facility])   [facility] Smitty Cords, DO   1 year ago Spastic hemiplegia of left dominant side as late effect of cerebral infarction [facility])   [facility], Netta Neat, DO   1 year ago Spastic hemiplegia of left dominant side as late effect of cerebral infarction (HCC)   [facility], Netta Neat, DO   1 year ago Spastic hemiplegia of left dominant side as late effect of cerebral infarction (HCC)   [facility] [place], Netta Neat, DO   2 years ago Spastic hemiplegia of left dominant side as late effect of cerebral  infarction [facility])   [facility], Netta Neat, DO              Passed - Patient is not pregnant       losartan (COZAAR) 100 MG tablet [Pharmacy Med Name: Losartan Potassium 100 MG Oral Tablet] 90 tablet 3    Sig: TAKE 1 TABLET BY MOUTH  DAILY     Cardiovascular:  Angiotensin Receptor Blockers Failed - 11/30/2021  5:35 PM      Failed - Cr in normal range and within 180 days    Creatinine  Date Value Ref Range Status  01/12/2015 0.97 0.60 - 1.30 mg/dL Final   Creatinine, Ser  Date Value Ref Range Status  12/20/2019 0.94 0.61 - 1.24 mg/dL Final          Failed - K in normal range and within 180 days    Potassium  Date Value Ref Range Status  12/20/2019 3.9 3.5 - 5.1 mmol/L Final  01/12/2015 4.0 3.5 - 5.1 mmol/L Final          Failed - Valid encounter within last 6 months    Recent Outpatient Visits           1 year ago Spastic hemiplegia  of left dominant side as late effect of cerebral infarction [facility])   [facility] [place], Netta Neat, DO   1 year ago Spastic hemiplegia of left dominant side as late effect of cerebral infarction [facility])   [facility], Netta Neat, DO   1 year ago Spastic hemiplegia of left dominant side as late effect of cerebral infarction [facility])   [facility] Smitty Cords, DO   1 year ago Spastic hemiplegia of left dominant side as late effect of cerebral infarction [facility])   [facility] Smitty Cords, DO   2 years ago Spastic hemiplegia of left dominant side as late effect of cerebral infarction [facility])   [facility], Netta Neat, DO              Passed - Patient is not pregnant      Passed - Last BP in normal range    BP Readings from Last 1 Encounters:  09/07/21 100/66           potassium chloride (KLOR-CON) 10 MEQ tablet [Pharmacy Med Name: Potassium Chloride ER 10 MEQ Oral Tablet  Extended Release] 180 tablet 3    Sig: TAKE 1 TABLET BY MOUTH TWICE  DAILY     Endocrinology:  Minerals - Potassium Supplementation Failed - 11/30/2021  5:35 PM      Failed - K in normal range and within 360 days    Potassium  Date Value Ref Range Status  12/20/2019 3.9 3.5 - 5.1 mmol/L Final  01/12/2015 4.0 3.5 - 5.1 mmol/L Final          Failed - Cr in normal range and within 360 days    Creatinine  Date Value Ref Range Status  01/12/2015 0.97 0.60 - 1.30 mg/dL Final   Creatinine, Ser  Date Value Ref Range Status  12/20/2019 0.94 0.61 - 1.24 mg/dL Final          Failed - Valid encounter within last 12 months    Recent Outpatient Visits           1 year ago Spastic hemiplegia of left dominant side as late effect of cerebral infarction [facility])   [facility] Smitty Cords, DO   1 year ago Spastic hemiplegia of left dominant side as late effect of cerebral infarction [facility])   [facility] Smitty Cords, DO   1 year ago Spastic hemiplegia of left dominant side as late effect of cerebral infarction [facility])   [facility] Smitty Cords, DO   1 year ago Spastic hemiplegia of left dominant side as late effect of cerebral infarction [facility])   [facility] Smitty Cords, DO   2 years ago Spastic hemiplegia of left dominant side as late effect of cerebral infarction [facility])   [facility] [place], Netta Neat, DO

## 2021-12-09 ENCOUNTER — Other Ambulatory Visit: Payer: Self-pay | Admitting: Family Medicine

## 2021-12-09 DIAGNOSIS — E782 Mixed hyperlipidemia: Secondary | ICD-10-CM

## 2021-12-09 DIAGNOSIS — I6930 Unspecified sequelae of cerebral infarction: Secondary | ICD-10-CM

## 2021-12-09 DIAGNOSIS — I10 Essential (primary) hypertension: Secondary | ICD-10-CM

## 2021-12-10 NOTE — Telephone Encounter (Signed)
Called patient to schedule appt . Call disconnected . Called patient back and no answer, unable to leave message due to mailbox is full.

## 2021-12-10 NOTE — Telephone Encounter (Signed)
Requested medication (s) are due for refill today: yes courtesy refill enough until next visit   Requested medication (s) are on the active medication list: yes  Last refill:  lipitor- 08/05/21 #30 3 refills, losartan - 07/30/21 #90 0 refills , potassium- 09/28/21 #180 0 refills   Future visit scheduled: yes in 1 week   Notes to clinic:  overdue encounter 1 year  . Last labs 12/20/2019 . Do you want to only refill courtesy refill until next visit? Or give 90 day supply?     Requested Prescriptions  Pending Prescriptions Disp Refills   atorvastatin (LIPITOR) 40 MG tablet [Pharmacy Med Name: Atorvastatin Calcium 40 MG Oral Tablet] 30 tablet 11    Sig: TAKE 1 TABLET BY MOUTH ONCE DAILY AT BEDTIME FOR CHOLESTEROL     Cardiovascular:  Antilipid - Statins Failed - 12/09/2021 10:53 AM      Failed - Total Cholesterol in normal range and within 360 days    No results found for: CHOL, POCCHOL, CHOLTOT        Failed - LDL in normal range and within 360 days    No results found for: LDLCALC, LDLC, HIRISKLDL, POCLDL, LDLDIRECT, REALLDLC, TOTLDLC        Failed - HDL in normal range and within 360 days    No results found for: HDL, POCHDL        Failed - Triglycerides in normal range and within 360 days    No results found for: TRIG, POCTRIG        Failed - Valid encounter within last 12 months    Recent Outpatient Visits           1 year ago Spastic hemiplegia of left dominant side as late effect of cerebral infarction [facility])   [facility] Smitty Cords, DO   1 year ago Spastic hemiplegia of left dominant side as late effect of cerebral infarction [facility])   [facility] [place], Netta Neat, DO   1 year ago Spastic hemiplegia of left dominant side as late effect of cerebral infarction [facility])   [facility] [place], Netta Neat, DO   1 year ago Spastic hemiplegia of left dominant side as late effect of cerebral infarction [facility])    [facility] [place], Netta Neat, DO   2 years ago Spastic hemiplegia of left dominant side as late effect of cerebral infarction (HCC)   [facility] [place], Netta Neat, DO       Future Appointments             In 1 week Althea Charon, Netta Neat, DO [facility], [facility]            Passed - Patient is not pregnant       losartan (COZAAR) 100 MG tablet [Pharmacy Med Name: Losartan Potassium 100 MG Oral Tablet] 90 tablet 3    Sig: TAKE 1 TABLET BY MOUTH  DAILY     Cardiovascular:  Angiotensin Receptor Blockers Failed - 12/09/2021 10:53 AM      Failed - Cr in normal range and within 180 days    Creatinine  Date Value Ref Range Status  01/12/2015 0.97 0.60 - 1.30 mg/dL Final   Creatinine, Ser  Date Value Ref Range Status  12/20/2019 0.94 0.61 - 1.24 mg/dL Final          Failed - K in normal range and within 180 days    Potassium  Date  Value Ref Range Status  12/20/2019 3.9 3.5 - 5.1 mmol/L Final  01/12/2015 4.0 3.5 - 5.1 mmol/L Final          Failed - Valid encounter within last 6 months    Recent Outpatient Visits           1 year ago Spastic hemiplegia of left dominant side as late effect of cerebral infarction [facility])   [facility], Netta Neat, DO   1 year ago Spastic hemiplegia of left dominant side as late effect of cerebral infarction [facility])   [facility] Smitty Cords, DO   1 year ago Spastic hemiplegia of left dominant side as late effect of cerebral infarction (HCC)   [facility] Smitty Cords, DO   1 year ago Spastic hemiplegia of left dominant side as late effect of cerebral infarction (HCC)   [facility] Althea Charon, Netta Neat, DO   2 years ago Spastic hemiplegia of left dominant side as late effect of cerebral infarction (HCC)   [facility] Althea Charon, Netta Neat, DO       Future  Appointments             In 1 week Althea Charon, Netta Neat, DO [facility], [facility]            Passed - Patient is not pregnant      Passed - Last BP in normal range    BP Readings from Last 1 Encounters:  09/07/21 100/66           potassium chloride (KLOR-CON) 10 MEQ tablet [Pharmacy Med Name: Potassium Chloride ER 10 MEQ Oral Tablet Extended Release] 180 tablet 3    Sig: TAKE 1 TABLET BY MOUTH TWICE  DAILY     Endocrinology:  Minerals - Potassium Supplementation Failed - 12/09/2021 10:53 AM      Failed - K in normal range and within 360 days    Potassium  Date Value Ref Range Status  12/20/2019 3.9 3.5 - 5.1 mmol/L Final  01/12/2015 4.0 3.5 - 5.1 mmol/L Final          Failed - Cr in normal range and within 360 days    Creatinine  Date Value Ref Range Status  01/12/2015 0.97 0.60 - 1.30 mg/dL Final   Creatinine, Ser  Date Value Ref Range Status  12/20/2019 0.94 0.61 - 1.24 mg/dL Final          Failed - Valid encounter within last 12 months    Recent Outpatient Visits           1 year ago Spastic hemiplegia of left dominant side as late effect of cerebral infarction [facility])   [facility] Smitty Cords, DO   1 year ago Spastic hemiplegia of left dominant side as late effect of cerebral infarction [facility])   [facility] Smitty Cords, DO   1 year ago Spastic hemiplegia of left dominant side as late effect of cerebral infarction [facility])   [facility], Netta Neat, DO   1 year ago Spastic hemiplegia of left dominant side as late effect of cerebral infarction [facility])   [facility] Smitty Cords, DO   2 years ago Spastic hemiplegia of left dominant side as late effect of cerebral infarction [facility])   [facility] Althea Charon, Netta Neat, DO       Future Appointments  In 1 week Althea Charon, Netta Neat, DO [facility], [facility]

## 2021-12-22 ENCOUNTER — Ambulatory Visit: Payer: Medicare Other | Admitting: Family Medicine

## 2021-12-22 ENCOUNTER — Telehealth: Payer: Self-pay

## 2021-12-22 DIAGNOSIS — N529 Male erectile dysfunction, unspecified: Secondary | ICD-10-CM

## 2021-12-22 MED ORDER — TADALAFIL 20 MG PO TABS: 20.0000 mg | ORAL_TABLET | Freq: Every day | ORAL | 8 refills | Status: DC | PRN

## 2021-12-22 NOTE — Telephone Encounter (Signed)
Incoming request from pt's new pharmacy for RX for tadalafil. Pt has transitioned all prescriptions to [facility]. RX sent to new pharmacy.

## 2022-01-19 ENCOUNTER — Telehealth: Payer: Self-pay

## 2022-01-19 NOTE — Telephone Encounter (Signed)
Trying to make sense of this because it says when I opened his chart that he was dismissed from our office.

## 2022-01-19 NOTE — Telephone Encounter (Signed)
Copied from [place] [id number]. Topic: General - Other >> Jan 19, 2022  1:51 PM McGill, Nelva Bush wrote: Reason for CRM: Liliane Channel, from[employer], is calling to follow up to see if we have received CMN forms. Liliane Channel stated that forms were sent on January 10, 2022.  Liliane Channel stated he would resend today.  Phone - [phone]  Fax- [phone]

## 2022-01-25 ENCOUNTER — Other Ambulatory Visit: Payer: Self-pay | Admitting: Family Medicine

## 2022-01-25 DIAGNOSIS — I10 Essential (primary) hypertension: Secondary | ICD-10-CM

## 2022-02-09 ENCOUNTER — Other Ambulatory Visit: Payer: Self-pay | Admitting: Family Medicine

## 2022-02-09 DIAGNOSIS — I10 Essential (primary) hypertension: Secondary | ICD-10-CM

## 2022-03-08 ENCOUNTER — Other Ambulatory Visit: Payer: Self-pay | Admitting: Family Medicine

## 2022-03-08 DIAGNOSIS — I10 Essential (primary) hypertension: Secondary | ICD-10-CM

## 2022-04-01 ENCOUNTER — Other Ambulatory Visit: Payer: Self-pay | Admitting: Family Medicine

## 2022-04-01 DIAGNOSIS — I10 Essential (primary) hypertension: Secondary | ICD-10-CM

## 2022-04-08 ENCOUNTER — Other Ambulatory Visit (HOSPITAL_COMMUNITY): Payer: Self-pay | Admitting: Physician Assistant

## 2022-04-08 ENCOUNTER — Other Ambulatory Visit: Payer: Self-pay | Admitting: Physician Assistant

## 2022-04-08 DIAGNOSIS — M5412 Radiculopathy, cervical region: Secondary | ICD-10-CM

## 2022-04-26 ENCOUNTER — Other Ambulatory Visit: Payer: Self-pay | Admitting: Family Medicine

## 2022-04-26 DIAGNOSIS — I10 Essential (primary) hypertension: Secondary | ICD-10-CM

## 2022-05-11 ENCOUNTER — Other Ambulatory Visit: Payer: Self-pay | Admitting: Urology

## 2022-05-11 DIAGNOSIS — N401 Enlarged prostate with lower urinary tract symptoms: Secondary | ICD-10-CM

## 2022-05-17 ENCOUNTER — Other Ambulatory Visit: Payer: Self-pay | Admitting: Family Medicine

## 2022-05-17 DIAGNOSIS — I10 Essential (primary) hypertension: Secondary | ICD-10-CM

## 2022-06-08 ENCOUNTER — Other Ambulatory Visit: Payer: Self-pay | Admitting: Physician Assistant

## 2022-06-08 DIAGNOSIS — M5412 Radiculopathy, cervical region: Secondary | ICD-10-CM

## 2022-06-27 ENCOUNTER — Other Ambulatory Visit: Payer: Self-pay | Admitting: Physician Assistant

## 2022-06-27 DIAGNOSIS — M5412 Radiculopathy, cervical region: Secondary | ICD-10-CM

## 2022-07-30 ENCOUNTER — Ambulatory Visit (HOSPITAL_COMMUNITY)
Admission: RE | Admit: 2022-07-30 | Discharge: 2022-07-30 | Disposition: A | Discharge status: Home / Self Care | Payer: Medicare Other | Source: Ambulatory Visit | Attending: Physician Assistant | Admitting: Physician Assistant

## 2022-07-30 DIAGNOSIS — M5412 Radiculopathy, cervical region: Secondary | ICD-10-CM | POA: Insufficient documentation

## 2022-08-09 ENCOUNTER — Ambulatory Visit: Payer: Medicare Other

## 2022-09-13 ENCOUNTER — Ambulatory Visit: Payer: Medicare Other | Admitting: Urology

## 2022-10-10 ENCOUNTER — Institutional Professional Consult (permissible substitution): Payer: Medicare Other | Admitting: Nurse Practitioner

## 2022-11-29 ENCOUNTER — Telehealth: Payer: Self-pay | Admitting: Family Medicine

## 2022-11-29 NOTE — Telephone Encounter (Signed)
Patient's fiance called and states patient has had an erection since Sunday and wants to know what to do. I advised her that he will need to be seen in the ER. This is very urgent at this point. She voiced understanding and will take him to the ER.

## 2022-12-01 ENCOUNTER — Telehealth: Payer: Self-pay | Admitting: *Deleted

## 2022-12-01 NOTE — Telephone Encounter (Signed)
Pt calling again stating that he has had an erection for over 2 hours, I advised pt and girlfriend that he needs to go to the ER now. Girlfriend was calling EMS now. Pt was seen 2 days ago at [facility] in [place], per girlfriend they " drained him"

## 2022-12-02 ENCOUNTER — Other Ambulatory Visit: Payer: Self-pay

## 2022-12-02 ENCOUNTER — Encounter (HOSPITAL_COMMUNITY): Payer: Self-pay | Admitting: *Deleted

## 2022-12-02 ENCOUNTER — Telehealth: Payer: Self-pay

## 2022-12-02 ENCOUNTER — Inpatient Hospital Stay (HOSPITAL_COMMUNITY)
Admission: EM | Admit: 2022-12-02 | Discharge: 2022-12-04 | DRG: 729 | Disposition: A | Discharge status: Home / Self Care | Payer: Medicare Other | Attending: Student | Admitting: Student

## 2022-12-02 ENCOUNTER — Emergency Department (HOSPITAL_COMMUNITY): Payer: Medicare Other

## 2022-12-02 DIAGNOSIS — Z886 Allergy status to analgesic agent status: Secondary | ICD-10-CM

## 2022-12-02 DIAGNOSIS — Z8616 Personal history of COVID-19: Secondary | ICD-10-CM | POA: Diagnosis not present

## 2022-12-02 DIAGNOSIS — T467X5A Adverse effect of peripheral vasodilators, initial encounter: Secondary | ICD-10-CM | POA: Diagnosis present

## 2022-12-02 DIAGNOSIS — F609 Personality disorder, unspecified: Secondary | ICD-10-CM | POA: Diagnosis present

## 2022-12-02 DIAGNOSIS — Z1152 Encounter for screening for COVID-19: Secondary | ICD-10-CM

## 2022-12-02 DIAGNOSIS — G894 Chronic pain syndrome: Secondary | ICD-10-CM | POA: Diagnosis not present

## 2022-12-02 DIAGNOSIS — F319 Bipolar disorder, unspecified: Secondary | ICD-10-CM | POA: Diagnosis present

## 2022-12-02 DIAGNOSIS — Z91013 Allergy to seafood: Secondary | ICD-10-CM

## 2022-12-02 DIAGNOSIS — G4730 Sleep apnea, unspecified: Secondary | ICD-10-CM | POA: Diagnosis present

## 2022-12-02 DIAGNOSIS — R339 Retention of urine, unspecified: Secondary | ICD-10-CM | POA: Diagnosis not present

## 2022-12-02 DIAGNOSIS — G4733 Obstructive sleep apnea (adult) (pediatric): Secondary | ICD-10-CM | POA: Diagnosis not present

## 2022-12-02 DIAGNOSIS — G2581 Restless legs syndrome: Secondary | ICD-10-CM | POA: Diagnosis not present

## 2022-12-02 DIAGNOSIS — E669 Obesity, unspecified: Secondary | ICD-10-CM | POA: Diagnosis not present

## 2022-12-02 DIAGNOSIS — N4833 Priapism, drug-induced: Secondary | ICD-10-CM | POA: Diagnosis not present

## 2022-12-02 DIAGNOSIS — R451 Restlessness and agitation: Secondary | ICD-10-CM | POA: Diagnosis not present

## 2022-12-02 DIAGNOSIS — M720 Palmar fascial fibromatosis [Dupuytren]: Secondary | ICD-10-CM | POA: Diagnosis not present

## 2022-12-02 DIAGNOSIS — E785 Hyperlipidemia, unspecified: Secondary | ICD-10-CM | POA: Diagnosis not present

## 2022-12-02 DIAGNOSIS — R410 Disorientation, unspecified: Secondary | ICD-10-CM | POA: Insufficient documentation

## 2022-12-02 DIAGNOSIS — R509 Fever, unspecified: Secondary | ICD-10-CM | POA: Insufficient documentation

## 2022-12-02 DIAGNOSIS — F17210 Nicotine dependence, cigarettes, uncomplicated: Secondary | ICD-10-CM | POA: Diagnosis not present

## 2022-12-02 DIAGNOSIS — G629 Polyneuropathy, unspecified: Secondary | ICD-10-CM | POA: Diagnosis not present

## 2022-12-02 DIAGNOSIS — I69354 Hemiplegia and hemiparesis following cerebral infarction affecting left non-dominant side: Secondary | ICD-10-CM | POA: Diagnosis not present

## 2022-12-02 DIAGNOSIS — Z6832 Body mass index (BMI) 32.0-32.9, adult: Secondary | ICD-10-CM

## 2022-12-02 DIAGNOSIS — G4700 Insomnia, unspecified: Secondary | ICD-10-CM | POA: Diagnosis not present

## 2022-12-02 DIAGNOSIS — M545 Low back pain, unspecified: Secondary | ICD-10-CM | POA: Diagnosis present

## 2022-12-02 DIAGNOSIS — F419 Anxiety disorder, unspecified: Secondary | ICD-10-CM | POA: Diagnosis not present

## 2022-12-02 DIAGNOSIS — I69352 Hemiplegia and hemiparesis following cerebral infarction affecting left dominant side: Secondary | ICD-10-CM

## 2022-12-02 DIAGNOSIS — F1190 Opioid use, unspecified, uncomplicated: Secondary | ICD-10-CM | POA: Diagnosis present

## 2022-12-02 DIAGNOSIS — N4830 Priapism, unspecified: Secondary | ICD-10-CM | POA: Diagnosis not present

## 2022-12-02 DIAGNOSIS — N529 Male erectile dysfunction, unspecified: Secondary | ICD-10-CM | POA: Diagnosis present

## 2022-12-02 DIAGNOSIS — Z7902 Long term (current) use of antithrombotics/antiplatelets: Secondary | ICD-10-CM | POA: Diagnosis not present

## 2022-12-02 DIAGNOSIS — Z79891 Long term (current) use of opiate analgesic: Secondary | ICD-10-CM

## 2022-12-02 DIAGNOSIS — I10 Essential (primary) hypertension: Secondary | ICD-10-CM | POA: Diagnosis not present

## 2022-12-02 DIAGNOSIS — Z79899 Other long term (current) drug therapy: Secondary | ICD-10-CM

## 2022-12-02 DIAGNOSIS — Z8673 Personal history of transient ischemic attack (TIA), and cerebral infarction without residual deficits: Secondary | ICD-10-CM

## 2022-12-02 DIAGNOSIS — F3342 Major depressive disorder, recurrent, in full remission: Secondary | ICD-10-CM

## 2022-12-02 DIAGNOSIS — G8110 Spastic hemiplegia affecting unspecified side: Secondary | ICD-10-CM | POA: Diagnosis present

## 2022-12-02 DIAGNOSIS — M792 Neuralgia and neuritis, unspecified: Secondary | ICD-10-CM | POA: Diagnosis present

## 2022-12-02 HISTORY — DX: Bipolar disorder, unspecified: F31.9

## 2022-12-02 LAB — CBC
HCT: 35.3 % — ABNORMAL LOW (ref 39.0–52.0)
Hemoglobin: 11.6 g/dL — ABNORMAL LOW (ref 13.0–17.0)
MCH: 31.2 pg (ref 26.0–34.0)
MCHC: 32.9 g/dL (ref 30.0–36.0)
MCV: 94.9 fL (ref 80.0–100.0)
Platelets: 271 10*3/uL (ref 150–400)
RBC: 3.72 MIL/uL — ABNORMAL LOW (ref 4.22–5.81)
RDW: 13.6 % (ref 11.5–15.5)
WBC: 10.9 10*3/uL — ABNORMAL HIGH (ref 4.0–10.5)
nRBC: 0 % (ref 0.0–0.2)

## 2022-12-02 LAB — RESP PANEL BY RT-PCR (RSV, FLU A&B, COVID)  RVPGX2
Influenza A by PCR: NEGATIVE
Influenza B by PCR: NEGATIVE
Resp Syncytial Virus by PCR: NEGATIVE
SARS Coronavirus 2 by RT PCR: NEGATIVE

## 2022-12-02 LAB — TYPE AND SCREEN
ABO/RH(D): B POS
Antibody Screen: NEGATIVE

## 2022-12-02 MED ORDER — ACETAMINOPHEN 325 MG PO TABS
650.0000 mg | ORAL_TABLET | Freq: Four times a day (QID) | ORAL | Status: DC | PRN
  Administered 2022-12-03 – 2022-12-04 (×2): 650 mg via ORAL
  Filled 2022-12-02 (×3): qty 2

## 2022-12-02 MED ORDER — MORPHINE SULFATE (PF) 2 MG/ML IV SOLN
1.0000 mg | INTRAVENOUS | Status: DC | PRN
  Administered 2022-12-02: 2 mg via INTRAVENOUS
  Filled 2022-12-02: qty 1

## 2022-12-02 MED ORDER — LORAZEPAM 2 MG/ML IJ SOLN: 2.0000 mg | Freq: Once | INTRAMUSCULAR | Status: AC

## 2022-12-02 MED ORDER — ACETAMINOPHEN 650 MG RE SUPP: 650.0000 mg | Freq: Four times a day (QID) | RECTAL | Status: DC | PRN

## 2022-12-02 MED ORDER — ACETAMINOPHEN 325 MG PO TABS
650.0000 mg | ORAL_TABLET | Freq: Once | ORAL | Status: AC
  Administered 2022-12-02: 650 mg via ORAL
  Filled 2022-12-02: qty 2

## 2022-12-02 MED ORDER — HYDROMORPHONE HCL 1 MG/ML IJ SOLN
1.0000 mg | Freq: Once | INTRAMUSCULAR | Status: AC
  Administered 2022-12-02: 1 mg via INTRAVENOUS
  Filled 2022-12-02: qty 1

## 2022-12-02 MED ORDER — TAMSULOSIN HCL 0.4 MG PO CAPS
0.4000 mg | ORAL_CAPSULE | Freq: Every day | ORAL | Status: DC
  Administered 2022-12-02 – 2022-12-04 (×3): 0.4 mg via ORAL
  Filled 2022-12-02 (×3): qty 1

## 2022-12-02 MED ORDER — LORAZEPAM 2 MG/ML IJ SOLN
INTRAMUSCULAR | Status: AC
  Administered 2022-12-02: 2 mg via INTRAVENOUS
  Filled 2022-12-02: qty 1

## 2022-12-02 MED ORDER — SODIUM CHLORIDE 0.9 % IV SOLN: INTRAVENOUS | Status: DC

## 2022-12-02 MED ORDER — ONDANSETRON HCL 4 MG/2ML IJ SOLN
4.0000 mg | Freq: Once | INTRAMUSCULAR | Status: AC
  Administered 2022-12-02: 4 mg via INTRAVENOUS
  Filled 2022-12-02: qty 2

## 2022-12-02 MED ORDER — PHENYLEPHRINE 200 MCG/ML FOR PRIAPISM / HYPOTENSION
INTRAMUSCULAR | Status: AC
  Filled 2022-12-02: qty 50

## 2022-12-02 MED ORDER — SERTRALINE HCL 25 MG PO TABS
25.0000 mg | ORAL_TABLET | Freq: Every day | ORAL | Status: DC
  Administered 2022-12-03 – 2022-12-04 (×2): 25 mg via ORAL
  Filled 2022-12-02 (×2): qty 1

## 2022-12-02 MED ORDER — PHENYLEPHRINE 200 MCG/ML FOR PRIAPISM / HYPOTENSION
50.0000 ug | Freq: Once | INTRAMUSCULAR | Status: AC
  Administered 2022-12-02: 50 ug via INTRACAVERNOUS
  Filled 2022-12-02: qty 50

## 2022-12-02 MED ORDER — MORPHINE SULFATE (PF) 4 MG/ML IV SOLN
4.0000 mg | Freq: Once | INTRAVENOUS | Status: AC
  Administered 2022-12-02: 4 mg via INTRAVENOUS
  Filled 2022-12-02: qty 1

## 2022-12-02 NOTE — ED Notes (Signed)
Family updated as to patient's status.

## 2022-12-02 NOTE — Progress Notes (Signed)
Patient unable to wear CPAP due to mental status

## 2022-12-02 NOTE — Consult Note (Signed)
Urology Consult  Referring physician: Dr. Deretha EmoryZackowski Reason for referral: Priapism  Chief Complaint: penile pain  History of Present Illness: Mark Wells is a 51yo with a history of CVA and mood disorder who presented to the ER with a 4 day history of priapism. Mark Wells was deen in [place] on Monday and underwent irrigation for priapism. His erection returned on Tuesday and [facility] and was given Norco and sudafed. His erection did not resolve so Mark Wells presented [facility]. No history of substance abuse. No sickle cell trait per patient but Mark Wells is a poor historian. Mark Wells denies any erectile dysfunction medication.   Past Medical History:  Diagnosis Date   Anemia    Anxiety    Chronic back pain    COVID-19 06/2019   July 2020   Depression    Hemiplegia affecting left nondominant side (HCC)    Hyperlipidemia    Mood disorder (HCC)    Personality disorder (HCC)    Restless leg syndrome    Sleep apnea    Stroke [facility](HCC)    Past Surgical History:  Procedure Laterality Date   BACK SURGERY      Medications: I have reviewed the patient's current medications. Allergies:  Allergies  Allergen Reactions   Aspirin Other (See Comments)    Has Gastric Ulcer Other reaction(s): jittery   Ibuprofen Other (See Comments)    Ulcers- bleeding Other reaction(s): rectal bleeding   Shellfish Allergy Swelling    Other reaction(s): SOB    History reviewed. No pertinent family history. Social History:  reports that Mark Wells has been smoking cigarettes. Mark Wells has been smoking an average of .5 packs per day. Mark Wells uses smokeless tobacco. Mark Wells reports current drug use. Drug: Oxycodone. Mark Wells reports that Mark Wells does not drink alcohol.  Review of Systems  Genitourinary:  Positive for penile pain.  All other systems reviewed and are negative.   Physical Exam:  Vital signs in last 24 hours: Temp:  [100.4 F (38 C)-101.3 F (38.5 C)] 100.4 F (38 C) (12/08 1442) Pulse Rate:  [103-128] 108 (12/08  1500) Resp:  [14-26] 14 (12/08 1500) BP: (74-151)/(61-123) 101/78 (12/08 1500) SpO2:  [90 %-96 %] 90 % (12/08 1500) Weight:  [91.6 kg] 91.6 kg (12/08 1057) Physical Exam Constitutional:      Appearance: Normal appearance.  HENT:     Head: Normocephalic and atraumatic.     Nose: No congestion.     Mouth/Throat:     Mouth: Mucous membranes are dry.  Cardiovascular:     Rate and Rhythm: Tachycardia present.  Pulmonary:     Effort: Pulmonary effort is normal. No respiratory distress.  Abdominal:     General: Abdomen is flat. There is no distension.  Genitourinary:    Penis: Uncircumcised. Tenderness present.      Comments: Painful priapism present Musculoskeletal:     Cervical back: Normal range of motion and neck supple.  Skin:    General: Skin is warm and dry.  Neurological:     Mental Status: Mark Wells is alert. Mark Wells is disoriented.     Laboratory Data:  Results for orders placed or performed during the hospital encounter of 12/02/22 (from the past 72 hour(s))  CBC     Status: Abnormal   Collection Time: 12/02/22 11:41 AM  Result Value Ref Range   WBC 10.9 (H) 4.0 - 10.5 K/uL   RBC 3.72 (L) 4.22 - 5.81 MIL/uL   Hemoglobin 11.6 (L) 13.0 - 17.0 g/dL  HCT 35.3 (L) 39.0 - 52.0 %   MCV 94.9 80.0 - 100.0 fL   MCH 31.2 26.0 - 34.0 pg   MCHC 32.9 30.0 - 36.0 g/dL   RDW 85.4 62.7 - 03.5 %   Platelets 271 150 - 400 K/uL   nRBC 0.0 0.0 - 0.2 %    Comment: Performed at [facility], [address], [place], [place] [address]  Type and screen [facility]     Status: None   Collection Time: 12/02/22 11:41 AM  Result Value Ref Range   ABO/RH(D) B POS    Antibody Screen NEG    Sample Expiration      12/05/2022,2359 Performed at [facility], [address], [place], [place] [address]   Resp panel by RT-PCR (RSV, Flu A&B, Covid) Anterior Nasal Swab     Status: None   Collection Time: 12/02/22  2:32 PM   Specimen: Anterior Nasal Swab  Result Value Ref Range   SARS Coronavirus 2  by RT PCR NEGATIVE NEGATIVE    Comment: (NOTE) SARS-CoV-2 target nucleic acids are NOT DETECTED.  The SARS-CoV-2 RNA is generally detectable in upper respiratory specimens during the acute phase of infection. The lowest concentration of SARS-CoV-2 viral copies this assay can detect is 138 copies/mL. A negative result does not preclude SARS-Cov-2 infection and should not be used as the sole basis for treatment or other patient management decisions. A negative result may occur with  improper specimen collection/handling, submission of specimen other than nasopharyngeal swab, presence of viral mutation(s) within the areas targeted by this assay, and inadequate number of viral copies(<138 copies/mL). A negative result must be combined with clinical observations, patient history, and epidemiological information. The expected result is Negative.  Fact Sheet for Patients:  BloggerCourse.com  Fact Sheet for Healthcare Providers:  SeriousBroker.it  This test is no t yet approved or cleared by the [place] FDA and  has been authorized for detection and/or diagnosis of SARS-CoV-2 by FDA under an Emergency Use Authorization (EUA). This EUA will remain  in effect (meaning this test can be used) for the duration of the COVID-19 declaration under Section 564(b)(1) of the Act, 21 U.S.C.section 360bbb-3(b)(1), unless the authorization is terminated  or revoked sooner.       Influenza A by PCR NEGATIVE NEGATIVE   Influenza B by PCR NEGATIVE NEGATIVE    Comment: (NOTE) The Xpert Xpress SARS-CoV-2/FLU/RSV plus assay is intended as an aid in the diagnosis of influenza from Nasopharyngeal swab specimens and should not be used as a sole basis for treatment. Nasal washings and aspirates are unacceptable for Xpert Xpress SARS-CoV-2/FLU/RSV testing.  Fact Sheet for Patients: BloggerCourse.com  Fact Sheet for Healthcare  Providers: SeriousBroker.it  This test is not yet approved or cleared by the [place] FDA and has been authorized for detection and/or diagnosis of SARS-CoV-2 by FDA under an Emergency Use Authorization (EUA). This EUA will remain in effect (meaning this test can be used) for the duration of the COVID-19 declaration under Section 564(b)(1) of the Act, 21 U.S.C. section 360bbb-3(b)(1), unless the authorization is terminated or revoked.     Resp Syncytial Virus by PCR NEGATIVE NEGATIVE    Comment: (NOTE) Fact Sheet for Patients: BloggerCourse.com  Fact Sheet for Healthcare Providers: SeriousBroker.it  This test is not yet approved or cleared by the [place] FDA and has been authorized for detection and/or diagnosis of SARS-CoV-2 by FDA under an Emergency Use Authorization (EUA). This EUA will remain in effect (  meaning this test can be used) for the duration of the COVID-19 declaration under Section 564(b)(1) of the Act, 21 U.S.C. section 360bbb-3(b)(1), unless the authorization is terminated or revoked.  Performed at [facility], [address], [place], [place] [address]    Recent Results (from the past 240 hour(s))  Resp panel by RT-PCR (RSV, Flu A&B, Covid) Anterior Nasal Swab     Status: None   Collection Time: 12/02/22  2:32 PM   Specimen: Anterior Nasal Swab  Result Value Ref Range Status   SARS Coronavirus 2 by RT PCR NEGATIVE NEGATIVE Final    Comment: (NOTE) SARS-CoV-2 target nucleic acids are NOT DETECTED.  The SARS-CoV-2 RNA is generally detectable in upper respiratory specimens during the acute phase of infection. The lowest concentration of SARS-CoV-2 viral copies this assay can detect is 138 copies/mL. A negative result does not preclude SARS-Cov-2 infection and should not be used as the sole basis for treatment or other patient management decisions. A negative result may occur  with  improper specimen collection/handling, submission of specimen other than nasopharyngeal swab, presence of viral mutation(s) within the areas targeted by this assay, and inadequate number of viral copies(<138 copies/mL). A negative result must be combined with clinical observations, patient history, and epidemiological information. The expected result is Negative.  Fact Sheet for Patients:  BloggerCourse.com  Fact Sheet for Healthcare Providers:  SeriousBroker.it  This test is no t yet approved or cleared by the [place] FDA and  has been authorized for detection and/or diagnosis of SARS-CoV-2 by FDA under an Emergency Use Authorization (EUA). This EUA will remain  in effect (meaning this test can be used) for the duration of the COVID-19 declaration under Section 564(b)(1) of the Act, 21 U.S.C.section 360bbb-3(b)(1), unless the authorization is terminated  or revoked sooner.       Influenza A by PCR NEGATIVE NEGATIVE Final   Influenza B by PCR NEGATIVE NEGATIVE Final    Comment: (NOTE) The Xpert Xpress SARS-CoV-2/FLU/RSV plus assay is intended as an aid in the diagnosis of influenza from Nasopharyngeal swab specimens and should not be used as a sole basis for treatment. Nasal washings and aspirates are unacceptable for Xpert Xpress SARS-CoV-2/FLU/RSV testing.  Fact Sheet for Patients: BloggerCourse.com  Fact Sheet for Healthcare Providers: SeriousBroker.it  This test is not yet approved or cleared by the [place] FDA and has been authorized for detection and/or diagnosis of SARS-CoV-2 by FDA under an Emergency Use Authorization (EUA). This EUA will remain in effect (meaning this test can be used) for the duration of the COVID-19 declaration under Section 564(b)(1) of the Act, 21 U.S.C. section 360bbb-3(b)(1), unless the authorization is terminated  or revoked.     Resp Syncytial Virus by PCR NEGATIVE NEGATIVE Final    Comment: (NOTE) Fact Sheet for Patients: BloggerCourse.com  Fact Sheet for Healthcare Providers: SeriousBroker.it  This test is not yet approved or cleared by the [place] FDA and has been authorized for detection and/or diagnosis of SARS-CoV-2 by FDA under an Emergency Use Authorization (EUA). This EUA will remain in effect (meaning this test can be used) for the duration of the COVID-19 declaration under Section 564(b)(1) of the Act, 21 U.S.C. section 360bbb-3(b)(1), unless the authorization is terminated or revoked.  Performed at [facility], [address], [place], [place] [address]    Creatinine: No results for input(s): "CREATININE" in the last 168 hours. Baseline Creatinine: unknown  Impression/Assessment:  51yo with prispism  Plan:  The patients right and  left corpora were irrigated with normal saline and 10cc of 256mcg/ml phenylephrine was instilled. This brought detumescence and 1 hour after irrigation the patient did not develop recurrent priapism. The patient can be discharged home with sudafed to take prn.   Wilkie Aye 12/02/2022, 3:27 PM

## 2022-12-02 NOTE — ED Triage Notes (Addendum)
Pt brought in by [facility] from home with c/o priapism that started Monday and he went to a hospital and it was resolved. It started again Tuesday morning and he went to [facility] and was given Norco and Sudafed. Pt reports the erection has continued x 3 days. Pt denies taking any erectile dysfunction medications. Pt screaming, uncooperative and failing all over the stretcher. Dr. Deretha Emory made aware of pt's symptoms.

## 2022-12-02 NOTE — Telephone Encounter (Signed)
Pt's fiance calls triage line and states that the patient is currently writhing in pain on the floor and she is unsure what to do. She states that the patient is experiencing a very painful erection. She states that the patient was seen a the ER in [place], [place] yesterday for a procedure to have the erection reduced, he was transported via EMS to this facility despite his verbal request to be taken to [facility]. I advised fiance that unfortunately we do not have any discretion on where EMS transports patient. I advised fiance that she needs to call 911 immediately as she states that she is unable to pick the patient up to transport him herself. Fiance gave verbal understanding.

## 2022-12-02 NOTE — ED Provider Notes (Signed)
Patient transferred from [facility] for admission to medicine and management by urology for persistent and recurrent priapism.  On assessment, patient still has erect penis that is painful.  Admission orders were placed by hospitalist team at [facility].  Will call urology and let them know he is here and patient will be admitted.   Torianne Laflam, Canary Brim, MD 12/02/22 [phone]

## 2022-12-02 NOTE — H&P (Signed)
TRH H&P   Patient Demographics:    Mark Wells, is a 51 y.o. male  MRN: [id number]   DOB - 02-12-1971  Admit Date - 12/02/2022  Outpatient Primary MD for the patient is [facility]  Referring MD/NP/PA: [place] Roxan Hockey  Patient coming from: home  No chief complaint on file.     HPI:    Mark Wells  is a 51 y.o. male, with past medical history of CVA, residual left spastic weakness, hypertension, hyperlipidemia, pain syndrome, due to lumbar radiculopathy, bipolar disorder, neuropathy. -Patient presents to ED secondary to painful priapism, this has been going issue for last 4 days, patient was altered upon my evaluation, history was obtained from ED physician and wife by phone, patient went to [place] on Monday, where he underwent irrigation for priapism, reduction return on Tuesday, where he presented to [facility] where he was given Norco and Sudafed, patient did not resolve so he presented today to [facility], ER, where he was seen by surgery where he had another irrigation by Dr. Ronne Binning, no sickle cell trait as patient told urologist, patient on Cialis as needed, wife reports last time he took it 7 to 10 days ago. -In ED patient had irrigation for priapism with transient improvement, patient did develop priapism 1 hour after initial intervention, discussed with urology at [facility], given no further urology coverage at [facility], recommendation to transfer to [facility] for evaluation by urology.   Review of systems:    Will to obtain appropriate review of systems due to encephalopathy   With Past History of the following :    Past Medical History:  Diagnosis Date   Anemia    Anxiety    Bipolar disorder (HCC)    Chronic back pain    COVID-19 06/2019   July 2020   Depression    Hemiplegia affecting left nondominant side (HCC)     Hyperlipidemia    Mood disorder (HCC)    Personality disorder (HCC)    Restless leg syndrome    Sleep apnea    Stroke [facility])       Past Surgical History:  Procedure Laterality Date   BACK SURGERY        Social History:     Social History   Tobacco Use   Smoking status: Every Day    Packs/day: 0.50    Types: Cigarettes   Smokeless tobacco: Current  Substance Use Topics   Alcohol use: No        Family History :    History reviewed. No pertinent family history.    Home Medications:   Prior to Admission medications   Medication Sig Start Date End Date Taking? Authorizing Provider  amLODipine (NORVASC) 5 MG tablet TAKE 1 TABLET BY MOUTH ONCE DAILY. 09/17/21  Yes Karamalegos, Netta Neat, DO  atorvastatin (LIPITOR) 40 MG tablet TAKE  1 TABLET BY MOUTH ONCE DAILY AT BEDTIME FOR  CHOLESTEROL 12/10/21  Yes Karamalegos, Netta NeatAlexander J, DO  baclofen (LIORESAL) 20 MG tablet Take 20 mg by mouth 4 (four) times daily. 03/21/19  Yes [provider]  chlorthalidone (HYGROTON) 25 MG tablet TAKE 1 TABLET BY MOUTH EVERY DAY 04/02/21  Yes Karamalegos, Netta NeatAlexander J, DO  clopidogrel (PLAVIX) 75 MG tablet TAKE 1 TABLET BY MOUTH ONCE DAILY. 05/06/21  Yes Karamalegos, Netta NeatAlexander J, DO  losartan (COZAAR) 100 MG tablet TAKE 1 TABLET BY MOUTH  DAILY 12/10/21  Yes Karamalegos, Netta NeatAlexander J, DO  methocarbamol (ROBAXIN) 500 MG tablet TAKE (1) TABLET EVERY SIX HOURS AS NEEDED FOR MUSCLE SPASMS.    - MAY MAKE DROWSY - 05/25/21  Yes Karamalegos, Netta NeatAlexander J, DO  oxyCODONE-acetaminophen (PERCOCET) 7.5-325 MG tablet Take 1 tablet by mouth 3 (three) times daily as needed for severe pain.   Yes [provider]  potassium chloride (KLOR-CON) 10 MEQ tablet TAKE 1 TABLET BY MOUTH TWICE  DAILY 12/10/21  Yes Karamalegos, Netta NeatAlexander J, DO  pregabalin (LYRICA) 300 MG capsule Take 300 mg by mouth 2 (two) times daily.    Yes [provider]  QUEtiapine (SEROQUEL) 25 MG tablet Take 1 tablet by mouth  at bedtime.   Yes [provider]  sertraline (ZOLOFT) 50 MG tablet TAKE 1 TABLET BY MOUTH ONCE DAILY. MAY INCREASE TO 2 TABLETS DAILY IF NEEDED. 09/17/21  Yes Karamalegos, Netta NeatAlexander J, DO  tadalafil (CIALIS) 20 MG tablet Take 1 tablet (20 mg total) by mouth daily as needed for erectile dysfunction. 12/22/21  Yes Sondra ComeSninsky, Brian C, MD  tamsulosin (FLOMAX) 0.4 MG CAPS capsule TAKE 1 CAPSULE BY MOUTH  ONCE DAILY 05/12/22  Yes Sondra ComeSninsky, Brian C, MD  meclizine (ANTIVERT) 25 MG tablet Take 1 tablet (25 mg total) by mouth 3 (three) times daily as needed for dizziness. Patient not taking: Reported on 12/02/2022 02/26/19   Smitty CordsKaramalegos, Alexander J, DO  nystatin-triamcinolone ointment [facility](MYCOLOG) Apply 1 application topically 2 (two) times daily. Patient not taking: Reported on 12/02/2022 09/07/21   Sondra ComeSninsky, Brian C, MD     Allergies:     Allergies  Allergen Reactions   Aspirin Other (See Comments)    Has Gastric Ulcer Other reaction(s): jittery   Ibuprofen Other (See Comments)    Ulcers- bleeding Other reaction(s): rectal bleeding   Shellfish Allergy Swelling    Other reaction(s): SOB     Physical Exam:   Vitals  Blood pressure 104/84, pulse 98, temperature 98.2 F (36.8 C), temperature source Oral, resp. rate 16, height 5\' 6"  (1.676 m), weight 91.6 kg, SpO2 96 %.   1. General lethargic male, laying in bed, no apparent distress  2.  Chest lethargic, but open eyes to loud verbal stimuli, able to follow any commands or answer any questions appropriately  3. No F.N deficits, ALL C.Nerves Intact, to be with chronic left-sided weakness  4. Ears and Eyes appear Normal, Conjunctivae clear, PERRLA. Moist Oral Mucosa.  5. Supple Neck, No JVD, No cervical lymphadenopathy appriciated, No Carotid Bruits.  6. Symmetrical Chest wall movement, Good air movement bilaterally, CTAB.  7. RRR, No Gallops, Rubs or Murmurs, No Parasternal Heave.  8. Positive Bowel Sounds, Abdomen Soft, No tenderness,  No organomegaly appriciated,No rebound -guarding or rigidity.  9.  No Cyanosis, Normal Skin Turgor, No Skin Rash or Bruise.  10.  Has chronic left-sided weakness  Genital exam showing engorged penis, painful priapism present   Data Review:    CBC Recent  Labs  Lab 12/02/22 1141  WBC 10.9*  HGB 11.6*  HCT 35.3*  PLT 271  MCV 94.9  MCH 31.2  MCHC 32.9  RDW 13.6   ------------------------------------------------------------------------------------------------------------------  Chemistries  No results for input(s): "NA", "K", "CL", "CO2", "GLUCOSE", "BUN", "CREATININE", "CALCIUM", "MG", "AST", "ALT", "ALKPHOS", "BILITOT" in the last 168 hours.  Invalid input(s): "GFRCGP" ------------------------------------------------------------------------------------------------------------------ CrCl cannot be calculated (Patient's most recent lab result is older than the maximum 21 days allowed.). ------------------------------------------------------------------------------------------------------------------ No results for input(s): "TSH", "T4TOTAL", "T3FREE", "THYROIDAB" in the last 72 hours.  Invalid input(s): "FREET3"  Coagulation profile No results for input(s): "INR", "PROTIME" in the last 168 hours. ------------------------------------------------------------------------------------------------------------------- No results for input(s): "DDIMER" in the last 72 hours. -------------------------------------------------------------------------------------------------------------------  Cardiac Enzymes No results for input(s): "CKMB", "TROPONINI", "MYOGLOBIN" in the last 168 hours.  Invalid input(s): "CK" ------------------------------------------------------------------------------------------------------------------ No results found for: "BNP"   ---------------------------------------------------------------------------------------------------------------  Urinalysis     Component Value Date/Time   COLORURINE YELLOW (A) 12/20/2019 2213   APPEARANCEUR CLOUDY (A) 12/20/2019 2213   APPEARANCEUR clear 06/16/2019 0000   LABSPEC 1.008 12/20/2019 2213   LABSPEC 1.010 09/25/2013 0111   PHURINE 5.0 12/20/2019 2213   GLUCOSEU NEGATIVE 12/20/2019 2213   GLUCOSEU Negative 09/25/2013 0111   HGBUR LARGE (A) 12/20/2019 2213   BILIRUBINUR NEGATIVE 12/20/2019 2213   BILIRUBINUR Negative 09/25/2013 0111   KETONESUR NEGATIVE 12/20/2019 2213   PROTEINUR 100 (A) 12/20/2019 2213   UROBILINOGEN 0.2 02/11/2015 2118   NITRITE POSITIVE (A) 12/20/2019 2213   LEUKOCYTESUR LARGE (A) 12/20/2019 2213   LEUKOCYTESUR Negative 09/25/2013 0111    ----------------------------------------------------------------------------------------------------------------   Imaging Results:    DG Chest 1 View  Result Date: 12/02/2022 CLINICAL DATA:  Fever. EXAM: CHEST  1 VIEW COMPARISON:  None Available. FINDINGS: The heart size and mediastinal contours are within normal limits. Low lung volumes without evidence of focal consolidation or large pleural effusion. The visualized skeletal structures are unremarkable. IMPRESSION: Low lung volumes without evidence of acute cardiopulmonary process. Electronically Signed   By: Larose Hires D.O.   On: 12/02/2022 17:58      Assessment & Plan:    Principal Problem:   Priapism, drug-induced Active Problems:   Essential hypertension   Spastic hemiplegia of left dominant side as late effect of cerebral infarction (HCC)   OSA on CPAP   Chronic pain syndrome   Major depression, recurrent, full remission (HCC)   Priapism -Most likely drug-induced, as he is on Cialis, wife reports last intake 7 to 10 days ago -Received intervention over last 4 days, he had right and left corpora irrigated with normal saline/phenylephrine. -again another episode of priapism, no further urology coverage at [facility], he will be transferred to [facility] for  evaluation by Dr. Laverle Patter  Acute  encephalopathy -With underlying bipolar disorder, is on recall mainly for insomnia, and he is on Zoloft for [place], he is on Lyrica for neuropathy, but overall he is awake alert and appropriate with some personality disorder -Upon my evaluation patient lethargic, as he just received Ativan, Dilaudid and morphine  Hypertension - Blood pressure on the lower side, so we will hold all meds, as well he is altered so unsafe to swallow, will keep on as needed hydralazine  History of CVA with spastic hemiplegia/chronic left-sided weakness -Will hold Plavix as may need further intervention regarding his priapism -Resume Robaxin and Lyrica with more stable -Resume statin when more stable.  Major depression -Continue with Zoloft when able to take oral  Chronic pain syndrome -  Resume Percocet when safe to take oral  OSA - cont with CPAP   DVT Prophylaxis SCDs  AM Labs Ordered, also please review Full Orders  Family Communication: Admission, patients condition and plan of care including tests being ordered have been discussed with the patient.s wife  who indicate understanding and agree with the plan and Code Status.  Code Status Full  Likely DC to  home  Condition GUARDED    Consults called: urology    Admission status: inpatient    Time spent in minutes : 70 minutes   Huey Bienenstock M.D on 12/02/2022 at 8:30 PM   [facility] - Office  [phone]

## 2022-12-02 NOTE — ED Notes (Signed)
Patient arrives from [facility].  He is alert but uncomfortable.  Patient with ongoing priaprism for the past 4 days.

## 2022-12-02 NOTE — Consult Note (Signed)
Urology Consult   Physician requesting consult: Dr. Randol KernElgergawy  Reason for consult: Priapism  History of Present Illness: Mark Wells is a 51 y.o. who was seen at [facility] earlier today.  He was noted to have fever to 101, agitation, and priapism.  He was seen by Dr. Ronne BinningMcKenzie and required sedation with Ativan to allow examination and treatment.  Upon administration of Ativan, his priapism resolved but Dr. Ronne BinningMcKenzie did aspirate/irrigate him anyway.  He continued to complain of penile pain and was felt to have recurring priapism and he was transferred to [facility] for [facility] and further evaluation.    He is unable to provide a valid medical history but there is a report that he takes tadalafil.  He cannot tell me the last time he took this medication.  He denies a family history of sickle cell trait or anemia.  This has been recurrent over the past 4-5 days with multiple ER visits to [place] and [place].  He is on Plavix chronically due to prior history of CVA.  Currently, he complains of severe penile pain.  Past Medical History:  Diagnosis Date   Anemia    Anxiety    Bipolar disorder (HCC)    Chronic back pain    COVID-19 06/2019   July 2020   Depression    Hemiplegia affecting left nondominant side (HCC)    Hyperlipidemia    Mood disorder (HCC)    Personality disorder (HCC)    Restless leg syndrome    Sleep apnea    Stroke [facility](HCC)     Past Surgical History:  Procedure Laterality Date   BACK SURGERY      Medications:  Home meds:  No current facility-administered medications on file prior to encounter.   Current Outpatient Medications on File Prior to Encounter  Medication Sig Dispense Refill   amLODipine (NORVASC) 5 MG tablet TAKE 1 TABLET BY MOUTH ONCE DAILY. 30 tablet 0   atorvastatin (LIPITOR) 40 MG tablet TAKE 1 TABLET BY MOUTH ONCE DAILY AT BEDTIME FOR  CHOLESTEROL 90 tablet 0   baclofen (LIORESAL) 20 MG tablet Take 20 mg by mouth 4 (four) times daily.      chlorthalidone (HYGROTON) 25 MG tablet TAKE 1 TABLET BY MOUTH EVERY DAY 90 tablet 0   clopidogrel (PLAVIX) 75 MG tablet TAKE 1 TABLET BY MOUTH ONCE DAILY. 90 tablet 0   losartan (COZAAR) 100 MG tablet TAKE 1 TABLET BY MOUTH  DAILY 90 tablet 0   methocarbamol (ROBAXIN) 500 MG tablet TAKE (1) TABLET EVERY SIX HOURS AS NEEDED FOR MUSCLE SPASMS.    - MAY MAKE DROWSY - 120 tablet 0   oxyCODONE-acetaminophen (PERCOCET) 7.5-325 MG tablet Take 1 tablet by mouth 3 (three) times daily as needed for severe pain.     potassium chloride (KLOR-CON) 10 MEQ tablet TAKE 1 TABLET BY MOUTH TWICE  DAILY 180 tablet 0   pregabalin (LYRICA) 300 MG capsule Take 300 mg by mouth 2 (two) times daily.      QUEtiapine (SEROQUEL) 25 MG tablet Take 1 tablet by mouth at bedtime.     sertraline (ZOLOFT) 50 MG tablet TAKE 1 TABLET BY MOUTH ONCE DAILY. MAY INCREASE TO 2 TABLETS DAILY IF NEEDED. 60 tablet 0   tadalafil (CIALIS) 20 MG tablet Take 1 tablet (20 mg total) by mouth daily as needed for erectile dysfunction. 30 tablet 8   tamsulosin (FLOMAX) 0.4 MG CAPS capsule TAKE 1 CAPSULE BY MOUTH  ONCE DAILY 90 capsule 3  meclizine (ANTIVERT) 25 MG tablet Take 1 tablet (25 mg total) by mouth 3 (three) times daily as needed for dizziness. (Patient not taking: Reported on 12/02/2022) 90 tablet 1   nystatin-triamcinolone ointment (MYCOLOG) Apply 1 application topically 2 (two) times daily. (Patient not taking: Reported on 12/02/2022) 30 g 0     Scheduled Meds:  phenylephrine 200 mcg / ml CONC. DILUTION INJ (ED / Urology USE ONLY)       [START ON 12/03/2022] sertraline  25 mg Oral Daily   tamsulosin  0.4 mg Oral Daily   Continuous Infusions:  sodium chloride     PRN Meds:.acetaminophen **OR** acetaminophen, morphine injection, phenylephrine 200 mcg / ml CONC. DILUTION INJ (ED / Urology USE ONLY)  Allergies:  Allergies  Allergen Reactions   Aspirin Other (See Comments)    Has Gastric Ulcer Other reaction(s): jittery    Ibuprofen Other (See Comments)    Ulcers- bleeding Other reaction(s): rectal bleeding   Shellfish Allergy Swelling    Other reaction(s): SOB    History reviewed. No pertinent family history.  Social History:  reports that he has been smoking cigarettes. He has been smoking an average of .5 packs per day. He uses smokeless tobacco. He reports current drug use. Drug: Oxycodone. He reports that he does not drink alcohol.  ROS: A complete review of systems was performed.  All systems are negative except for pertinent findings as noted.  Physical Exam:  Vital signs in last 24 hours: Temp:  [98.1 F (36.7 C)-101.3 F (38.5 C)] 98.1 F (36.7 C) (12/08 2055) Pulse Rate:  [97-128] 98 (12/08 1930) Resp:  [14-26] 16 (12/08 1930) BP: (74-151)/(61-123) 104/84 (12/08 1930) SpO2:  [90 %-99 %] 96 % (12/08 1930) Weight:  [91.6 kg] 91.6 kg (12/08 1057) Constitutional:  Awake and responsive, writhing uncontrollably in bed Cardiovascular: No JVD Respiratory: Normal respiratory effort GI: Abdomen is soft, nondistended Genitourinary: No CVAT.  He has an engorged but not erect penis.  It is tender to palpation.  No masses.  Normal meatus. Lymphatic: No lymphadenopathy Neurologic: Grossly intact, no focal deficits Psychiatric: Normal mood and affect  Laboratory Data:  Recent Labs    12/02/22 1141  WBC 10.9*  HGB 11.6*  HCT 35.3*  PLT 271    No results for input(s): "NA", "K", "CL", "GLUCOSE", "BUN", "CALCIUM", "CREATININE" in the last 72 hours.  Invalid input(s): "CO3"   Results for orders placed or performed during the hospital encounter of 12/02/22 (from the past 24 hour(s))  CBC     Status: Abnormal   Collection Time: 12/02/22 11:41 AM  Result Value Ref Range   WBC 10.9 (H) 4.0 - 10.5 K/uL   RBC 3.72 (L) 4.22 - 5.81 MIL/uL   Hemoglobin 11.6 (L) 13.0 - 17.0 g/dL   HCT 50.3 (L) 54.6 - 56.8 %   MCV 94.9 80.0 - 100.0 fL   MCH 31.2 26.0 - 34.0 pg   MCHC 32.9 30.0 - 36.0 g/dL   RDW  12.7 51.7 - 00.1 %   Platelets 271 150 - 400 K/uL   nRBC 0.0 0.0 - 0.2 %  Type and screen [facility]     Status: None   Collection Time: 12/02/22 11:41 AM  Result Value Ref Range   ABO/RH(D) B POS    Antibody Screen NEG    Sample Expiration      12/05/2022,2359 Performed at [facility], [address], [place], [place] [address]   Resp panel by RT-PCR (RSV, Flu A&B,  Covid) Anterior Nasal Swab     Status: None   Collection Time: 12/02/22  2:32 PM   Specimen: Anterior Nasal Swab  Result Value Ref Range   SARS Coronavirus 2 by RT PCR NEGATIVE NEGATIVE   Influenza A by PCR NEGATIVE NEGATIVE   Influenza B by PCR NEGATIVE NEGATIVE   Resp Syncytial Virus by PCR NEGATIVE NEGATIVE   Recent Results (from the past 240 hour(s))  Resp panel by RT-PCR (RSV, Flu A&B, Covid) Anterior Nasal Swab     Status: None   Collection Time: 12/02/22  2:32 PM   Specimen: Anterior Nasal Swab  Result Value Ref Range Status   SARS Coronavirus 2 by RT PCR NEGATIVE NEGATIVE Final    Comment: (NOTE) SARS-CoV-2 target nucleic acids are NOT DETECTED.  The SARS-CoV-2 RNA is generally detectable in upper respiratory specimens during the acute phase of infection. The lowest concentration of SARS-CoV-2 viral copies this assay can detect is 138 copies/mL. A negative result does not preclude SARS-Cov-2 infection and should not be used as the sole basis for treatment or other patient management decisions. A negative result may occur with  improper specimen collection/handling, submission of specimen other than nasopharyngeal swab, presence of viral mutation(s) within the areas targeted by this assay, and inadequate number of viral copies(<138 copies/mL). A negative result must be combined with clinical observations, patient history, and epidemiological information. The expected result is Negative.  Fact Sheet for Patients:  BloggerCourse.com  Fact Sheet for Healthcare Providers:   SeriousBroker.it  This test is no t yet approved or cleared by the [place] FDA and  has been authorized for detection and/or diagnosis of SARS-CoV-2 by FDA under an Emergency Use Authorization (EUA). This EUA will remain  in effect (meaning this test can be used) for the duration of the COVID-19 declaration under Section 564(b)(1) of the Act, 21 U.S.C.section 360bbb-3(b)(1), unless the authorization is terminated  or revoked sooner.       Influenza A by PCR NEGATIVE NEGATIVE Final   Influenza B by PCR NEGATIVE NEGATIVE Final    Comment: (NOTE) The Xpert Xpress SARS-CoV-2/FLU/RSV plus assay is intended as an aid in the diagnosis of influenza from Nasopharyngeal swab specimens and should not be used as a sole basis for treatment. Nasal washings and aspirates are unacceptable for Xpert Xpress SARS-CoV-2/FLU/RSV testing.  Fact Sheet for Patients: BloggerCourse.com  Fact Sheet for Healthcare Providers: SeriousBroker.it  This test is not yet approved or cleared by the [place] FDA and has been authorized for detection and/or diagnosis of SARS-CoV-2 by FDA under an Emergency Use Authorization (EUA). This EUA will remain in effect (meaning this test can be used) for the duration of the COVID-19 declaration under Section 564(b)(1) of the Act, 21 U.S.C. section 360bbb-3(b)(1), unless the authorization is terminated or revoked.     Resp Syncytial Virus by PCR NEGATIVE NEGATIVE Final    Comment: (NOTE) Fact Sheet for Patients: BloggerCourse.com  Fact Sheet for Healthcare Providers: SeriousBroker.it  This test is not yet approved or cleared by the [place] FDA and has been authorized for detection and/or diagnosis of SARS-CoV-2 by FDA under an Emergency Use Authorization (EUA). This EUA will remain in effect (meaning this test can be used) for  the duration of the COVID-19 declaration under Section 564(b)(1) of the Act, 21 U.S.C. section 360bbb-3(b)(1), unless the authorization is terminated or revoked.  Performed at [facility], [address], [place], [place] [address]     Renal Function: No results for  input(s): "CREATININE" in the last 168 hours. CrCl cannot be calculated (Patient's most recent lab result is older than the maximum 21 days allowed.).  Radiologic Imaging: DG Chest 1 View  Result Date: 12/02/2022 CLINICAL DATA:  Fever. EXAM: CHEST  1 VIEW COMPARISON:  None Available. FINDINGS: The heart size and mediastinal contours are within normal limits. Low lung volumes without evidence of focal consolidation or large pleural effusion. The visualized skeletal structures are unremarkable. IMPRESSION: Low lung volumes without evidence of acute cardiopulmonary process. Electronically Signed   By: Larose Hires D.O.   On: 12/02/2022 17:58    I independently reviewed the above imaging studies.  Impression/Recommendation:  1) Recurrent priapism:  He does not currently have priapism and does not require urologic treatment.  Will follow and please call if he develops a firm and rigid erection for up to 4 hours.  Evaluate with drug screen, sickle cell panel.  Hold tadalafil.  Ok to manage pain with pain medication and agree with temporarily holding Plavix in case urologic treatment is needed during hospitalization. 2) Fever: Unclear etiology.  Would not be related to priapism. 3) Agitation: Unclear what his baseline mental status and personality is.  Question whether these symptoms are acute or chronic.  Unusual to have this type of response to priapism or penile pain.  Crecencio Mc 12/02/2022, 10:11 PM    Moody Bruins MD  CC: Dr. Randol Kern

## 2022-12-02 NOTE — ED Provider Notes (Signed)
[facility] EMERGENCY DEPARTMENT Provider Note   CSN: [id number] Arrival date & time: 12/02/22  1032     History  No chief complaint on file.  HPI Mark Wells is a 51 y.o. male with hypertension, chronic pain syndrome major depression presenting for penile pain.  Patient states that he was seen in [place] on Monday for priapism underwent irrigation and symptoms resolved.  Erection started again on Tuesday he went to [facility].  Patient stated that there they aspirated some fluid from his penis but he is unsure about how much. Given Norco and Sudafed and discharged home.  Patient now reports erection is continued for 3 days.  Denies use of erectile dysfunction meds. Describes the pain as excruciating and sharp located in his penis extending to the base of his penis.  Denies abnormal penile discharge.  HPI     Home Medications Prior to Admission medications   Medication Sig Start Date End Date Taking? Authorizing Provider  amLODipine (NORVASC) 5 MG tablet TAKE 1 TABLET BY MOUTH ONCE DAILY. 09/17/21  Yes Karamalegos, Netta Neat, DO  atorvastatin (LIPITOR) 40 MG tablet TAKE 1 TABLET BY MOUTH ONCE DAILY AT BEDTIME FOR  CHOLESTEROL 12/10/21  Yes Karamalegos, Netta Neat, DO  baclofen (LIORESAL) 20 MG tablet Take 20 mg by mouth 4 (four) times daily. 03/21/19  Yes [provider]  chlorthalidone (HYGROTON) 25 MG tablet TAKE 1 TABLET BY MOUTH EVERY DAY 04/02/21  Yes Karamalegos, Netta Neat, DO  clopidogrel (PLAVIX) 75 MG tablet TAKE 1 TABLET BY MOUTH ONCE DAILY. 05/06/21  Yes Karamalegos, Netta Neat, DO  losartan (COZAAR) 100 MG tablet TAKE 1 TABLET BY MOUTH  DAILY 12/10/21  Yes Karamalegos, Netta Neat, DO  methocarbamol (ROBAXIN) 500 MG tablet TAKE (1) TABLET EVERY SIX HOURS AS NEEDED FOR MUSCLE SPASMS.    - MAY MAKE DROWSY - 05/25/21  Yes Karamalegos, Netta Neat, DO  oxyCODONE-acetaminophen (PERCOCET) 7.5-325 MG tablet Take 1 tablet by mouth 3 (three) times daily as needed  for severe pain.   Yes [provider]  potassium chloride (KLOR-CON) 10 MEQ tablet TAKE 1 TABLET BY MOUTH TWICE  DAILY 12/10/21  Yes Karamalegos, Netta Neat, DO  pregabalin (LYRICA) 300 MG capsule Take 300 mg by mouth 2 (two) times daily.    Yes [provider]  QUEtiapine (SEROQUEL) 25 MG tablet Take 1 tablet by mouth at bedtime.   Yes [provider]  sertraline (ZOLOFT) 50 MG tablet TAKE 1 TABLET BY MOUTH ONCE DAILY. MAY INCREASE TO 2 TABLETS DAILY IF NEEDED. 09/17/21  Yes Karamalegos, Netta Neat, DO  tadalafil (CIALIS) 20 MG tablet Take 1 tablet (20 mg total) by mouth daily as needed for erectile dysfunction. 12/22/21  Yes Sondra Come, MD  tamsulosin (FLOMAX) 0.4 MG CAPS capsule TAKE 1 CAPSULE BY MOUTH  ONCE DAILY 05/12/22  Yes Sondra Come, MD  meclizine (ANTIVERT) 25 MG tablet Take 1 tablet (25 mg total) by mouth 3 (three) times daily as needed for dizziness. Patient not taking: Reported on 12/02/2022 02/26/19   Smitty Cords, DO  nystatin-triamcinolone ointment [facility]) Apply 1 application topically 2 (two) times daily. Patient not taking: Reported on 12/02/2022 09/07/21   Sondra Come, MD      Allergies    Aspirin, Ibuprofen, and Shellfish allergy    Review of Systems   Review of Systems  Genitourinary:  Positive for penile pain.    Physical Exam Updated Vital Signs BP 101/75 (BP Location: Left Arm)   Pulse  99   Temp 98.5 F (36.9 C) (Oral)   Resp 14   Ht 5\' 6"  (1.676 m)   Wt 91.6 kg   SpO2 99%   BMI 32.59 kg/m  Physical Exam Vitals and nursing note reviewed.  Constitutional:      General: He is in acute distress.  HENT:     Head: Normocephalic and atraumatic.     Mouth/Throat:     Mouth: Mucous membranes are moist.  Eyes:     General:        Right eye: No discharge.        Left eye: No discharge.     Conjunctiva/sclera: Conjunctivae normal.  Cardiovascular:     Rate and Rhythm: Regular rhythm. Tachycardia present.      Pulses: Normal pulses.     Heart sounds: Normal heart sounds.  Pulmonary:     Effort: Pulmonary effort is normal.     Breath sounds: Normal breath sounds.  Abdominal:     General: Abdomen is flat.     Palpations: Abdomen is soft.  Genitourinary:    Comments: Penis is erect and tender to palpation. Skin:    General: Skin is warm and dry.  Neurological:     General: No focal deficit present.  Psychiatric:        Mood and Affect: Mood normal.     ED Results / Procedures / Treatments   Labs (all labs ordered are listed, but only abnormal results are displayed) Labs Reviewed  CBC - Abnormal; Notable for the following components:      Result Value   WBC 10.9 (*)    RBC 3.72 (*)    Hemoglobin 11.6 (*)    HCT 35.3 (*)    All other components within normal limits  RESP PANEL BY RT-PCR (RSV, FLU A&B, COVID)  [id number]  URINALYSIS, ROUTINE W REFLEX MICROSCOPIC  SICKLE CELL SCREEN  TYPE AND SCREEN    EKG None  Radiology DG Chest 1 View  Result Date: 12/02/2022 CLINICAL DATA:  Fever. EXAM: CHEST  1 VIEW COMPARISON:  None Available. FINDINGS: The heart size and mediastinal contours are within normal limits. Low lung volumes without evidence of focal consolidation or large pleural effusion. The visualized skeletal structures are unremarkable. IMPRESSION: Low lung volumes without evidence of acute cardiopulmonary process. Electronically Signed   By: 14/07/2022 D.O.   On: 12/02/2022 17:58    Procedures Procedures    Medications Ordered in ED Medications  morphine (PF) 4 MG/ML injection 4 mg (4 mg Intravenous Given 12/02/22 1136)  ondansetron (ZOFRAN) injection 4 mg (4 mg Intravenous Given 12/02/22 1136)  phenylephrine 200 mcg / ml CONC. DILUTION INJ (ED / Urology USE ONLY) (50 mcg Intracavernosal Given by Other 12/02/22 1410)  LORazepam (ATIVAN) injection 2 mg (2 mg Intravenous Given 12/02/22 1427)  acetaminophen (TYLENOL) tablet 650 mg (650 mg Oral Given 12/02/22 1514)   HYDROmorphone (DILAUDID) injection 1 mg (1 mg Intravenous Given 12/02/22 1616)    ED Course/ Medical Decision Making/ A&P Clinical Course as of 12/02/22 1853  Fri Dec 02, 2022  1700 Discussed patient with Dr. Dec 04, 2022 given that erection returned and he was still expressing penile pain.  Advised to transfer to [facility] for further evaluation by urology.  Also requested to sickle cell evaluation.  Treated pain with Dilaudid. [JR]    Clinical Course User Index [JR] [facility], PA-C  Medical Decision Making Amount and/or Complexity of Data Reviewed Labs: ordered. Radiology: ordered.  Risk OTC drugs. Prescription drug management.   Initial Impression and Ddx 51 year old male in acute distress endorsing exquisite pain from his erect penis.  Exam did reveal erect penis and tenderness to palpation.  Differential diagnosis for this complaint includes priapism, paraphimosis, STD, erection from sexual arousal Patient PMH that increases complexity of ED encounter: Hypertension, anemia mood and bipolar disorder  Interpretation of Diagnostics I independent reviewed and interpreted the labs as followed: leukocytosis, anemia  - I independently visualized the following imaging with scope of interpretation limited to determining acute life threatening conditions related to emergency care: CXR, which revealed low lung volumes  Patient Reassessment and Ultimate Disposition/Management Treated pain with morphine then later dilaudid. Upon reevaluation, patient resting comfortably in bed. Consulted urology who irrigated and instilled phenylephrine. Symptoms abated but then returned approximately 1 hour later. Reached out to urology, Dr. Laverle Patter, again who advised to transfer to [facility] for further ongoing management. Advised patient may need surgical intervention. Also requested sickle cell screen. Talked to Dr. Thayer Ohm Tegeler who accepted him for ED to ED transfer to [facility].    Patient management required discussion with the following services or consulting groups:  Hospitalist Service and Urology  Complexity of Problems Addressed Acute complicated illness or Injury  Additional Data Reviewed and Analyzed Further history obtained from: Further history from spouse/family member, Prior ED visit notes, Recent PCP notes, and Recent Consult notes  Patient Encounter Risk Assessment Consideration of hospitalization and Major procedures         Final Clinical Impression(s) / ED Diagnoses Final diagnoses:  Priapism    Rx / DC Orders ED Discharge Orders     None         Gareth Eagle, PA-C 12/02/22 1853    Vanetta Mulders, MD 12/10/22 1720

## 2022-12-03 DIAGNOSIS — G8929 Other chronic pain: Secondary | ICD-10-CM

## 2022-12-03 DIAGNOSIS — R509 Fever, unspecified: Secondary | ICD-10-CM | POA: Diagnosis not present

## 2022-12-03 DIAGNOSIS — F1190 Opioid use, unspecified, uncomplicated: Secondary | ICD-10-CM | POA: Diagnosis not present

## 2022-12-03 DIAGNOSIS — R410 Disorientation, unspecified: Secondary | ICD-10-CM | POA: Diagnosis not present

## 2022-12-03 DIAGNOSIS — Z1152 Encounter for screening for COVID-19: Secondary | ICD-10-CM | POA: Diagnosis not present

## 2022-12-03 DIAGNOSIS — F609 Personality disorder, unspecified: Secondary | ICD-10-CM | POA: Diagnosis present

## 2022-12-03 DIAGNOSIS — N4830 Priapism, unspecified: Secondary | ICD-10-CM | POA: Diagnosis present

## 2022-12-03 DIAGNOSIS — M792 Neuralgia and neuritis, unspecified: Secondary | ICD-10-CM

## 2022-12-03 DIAGNOSIS — N529 Male erectile dysfunction, unspecified: Secondary | ICD-10-CM | POA: Diagnosis present

## 2022-12-03 DIAGNOSIS — F319 Bipolar disorder, unspecified: Secondary | ICD-10-CM | POA: Diagnosis present

## 2022-12-03 DIAGNOSIS — M5440 Lumbago with sciatica, unspecified side: Secondary | ICD-10-CM

## 2022-12-03 DIAGNOSIS — R339 Retention of urine, unspecified: Secondary | ICD-10-CM | POA: Diagnosis present

## 2022-12-03 DIAGNOSIS — E669 Obesity, unspecified: Secondary | ICD-10-CM | POA: Diagnosis present

## 2022-12-03 DIAGNOSIS — G4733 Obstructive sleep apnea (adult) (pediatric): Secondary | ICD-10-CM | POA: Diagnosis present

## 2022-12-03 DIAGNOSIS — N4833 Priapism, drug-induced: Secondary | ICD-10-CM | POA: Diagnosis present

## 2022-12-03 DIAGNOSIS — Z8673 Personal history of transient ischemic attack (TIA), and cerebral infarction without residual deficits: Secondary | ICD-10-CM

## 2022-12-03 DIAGNOSIS — F419 Anxiety disorder, unspecified: Secondary | ICD-10-CM | POA: Diagnosis present

## 2022-12-03 DIAGNOSIS — G894 Chronic pain syndrome: Secondary | ICD-10-CM | POA: Diagnosis present

## 2022-12-03 DIAGNOSIS — G629 Polyneuropathy, unspecified: Secondary | ICD-10-CM | POA: Diagnosis present

## 2022-12-03 DIAGNOSIS — F17210 Nicotine dependence, cigarettes, uncomplicated: Secondary | ICD-10-CM | POA: Diagnosis present

## 2022-12-03 DIAGNOSIS — E785 Hyperlipidemia, unspecified: Secondary | ICD-10-CM | POA: Diagnosis present

## 2022-12-03 DIAGNOSIS — Z8616 Personal history of COVID-19: Secondary | ICD-10-CM | POA: Diagnosis not present

## 2022-12-03 DIAGNOSIS — G4730 Sleep apnea, unspecified: Secondary | ICD-10-CM | POA: Diagnosis present

## 2022-12-03 DIAGNOSIS — R451 Restlessness and agitation: Secondary | ICD-10-CM | POA: Diagnosis not present

## 2022-12-03 DIAGNOSIS — I10 Essential (primary) hypertension: Secondary | ICD-10-CM | POA: Diagnosis present

## 2022-12-03 DIAGNOSIS — Z7902 Long term (current) use of antithrombotics/antiplatelets: Secondary | ICD-10-CM | POA: Diagnosis not present

## 2022-12-03 DIAGNOSIS — I69354 Hemiplegia and hemiparesis following cerebral infarction affecting left non-dominant side: Secondary | ICD-10-CM | POA: Diagnosis not present

## 2022-12-03 DIAGNOSIS — G8110 Spastic hemiplegia affecting unspecified side: Secondary | ICD-10-CM

## 2022-12-03 DIAGNOSIS — M720 Palmar fascial fibromatosis [Dupuytren]: Secondary | ICD-10-CM | POA: Diagnosis present

## 2022-12-03 DIAGNOSIS — G4700 Insomnia, unspecified: Secondary | ICD-10-CM | POA: Diagnosis present

## 2022-12-03 DIAGNOSIS — G2581 Restless legs syndrome: Secondary | ICD-10-CM | POA: Diagnosis present

## 2022-12-03 LAB — COMPREHENSIVE METABOLIC PANEL
ALT: 13 U/L (ref 0–44)
AST: 14 U/L — ABNORMAL LOW (ref 15–41)
Albumin: 3.3 g/dL — ABNORMAL LOW (ref 3.5–5.0)
Alkaline Phosphatase: 55 U/L (ref 38–126)
Anion gap: 11 (ref 5–15)
BUN: 15 mg/dL (ref 6–20)
CO2: 27 mmol/L (ref 22–32)
Calcium: 9 mg/dL (ref 8.9–10.3)
Chloride: 102 mmol/L (ref 98–111)
Creatinine, Ser: 0.85 mg/dL (ref 0.61–1.24)
GFR, Estimated: >60 mL/min (ref >60)
Glucose, Bld: 105 mg/dL — ABNORMAL HIGH (ref 70–99)
Potassium: 3.5 mmol/L (ref 3.5–5.1)
Sodium: 140 mmol/L (ref 135–145)
Total Bilirubin: 1.3 mg/dL — ABNORMAL HIGH (ref 0.3–1.2)
Total Protein: 7.1 g/dL (ref 6.5–8.1)

## 2022-12-03 LAB — URINALYSIS, ROUTINE W REFLEX MICROSCOPIC
Bilirubin Urine: NEGATIVE
Glucose, UA: NEGATIVE mg/dL
Hgb urine dipstick: NEGATIVE
Ketones, ur: NEGATIVE mg/dL
Leukocytes,Ua: NEGATIVE
Nitrite: NEGATIVE
Protein, ur: NEGATIVE mg/dL
Specific Gravity, Urine: 1.017 (ref 1.005–1.030)
pH: 6 (ref 5.0–8.0)

## 2022-12-03 LAB — CBC WITH DIFFERENTIAL/PLATELET
Abs Immature Granulocytes: 0.04 10*3/uL (ref 0.00–0.07)
Basophils Absolute: 0.1 10*3/uL (ref 0.0–0.1)
Basophils Relative: 1 %
Eosinophils Absolute: 0 10*3/uL (ref 0.0–0.5)
Eosinophils Relative: 0 %
HCT: 31.1 % — ABNORMAL LOW (ref 39.0–52.0)
Hemoglobin: 9.9 g/dL — ABNORMAL LOW (ref 13.0–17.0)
Immature Granulocytes: 0 %
Lymphocytes Relative: 20 %
Lymphs Abs: 2.1 10*3/uL (ref 0.7–4.0)
MCH: 30.8 pg (ref 26.0–34.0)
MCHC: 31.8 g/dL (ref 30.0–36.0)
MCV: 96.9 fL (ref 80.0–100.0)
Monocytes Absolute: 1.1 10*3/uL — ABNORMAL HIGH (ref 0.1–1.0)
Monocytes Relative: 11 %
Neutro Abs: 7.1 10*3/uL (ref 1.7–7.7)
Neutrophils Relative %: 68 %
Platelets: 244 10*3/uL (ref 150–400)
RBC: 3.21 MIL/uL — ABNORMAL LOW (ref 4.22–5.81)
RDW: 13.3 % (ref 11.5–15.5)
WBC: 10.4 10*3/uL (ref 4.0–10.5)
nRBC: 0 % (ref 0.0–0.2)

## 2022-12-03 LAB — RAPID URINE DRUG SCREEN, HOSP PERFORMED
Amphetamines: NOT DETECTED
Barbiturates: NOT DETECTED
Benzodiazepines: NOT DETECTED
Cocaine: POSITIVE — AB
Opiates: POSITIVE — AB
Tetrahydrocannabinol: NOT DETECTED

## 2022-12-03 LAB — BASIC METABOLIC PANEL
Anion gap: 10 (ref 5–15)
Anion gap: 11 (ref 5–15)
BUN: 15 mg/dL (ref 6–20)
BUN: 18 mg/dL (ref 6–20)
CO2: 27 mmol/L (ref 22–32)
CO2: 28 mmol/L (ref 22–32)
Calcium: 8.8 mg/dL — ABNORMAL LOW (ref 8.9–10.3)
Calcium: 9.1 mg/dL (ref 8.9–10.3)
Chloride: 102 mmol/L (ref 98–111)
Chloride: 99 mmol/L (ref 98–111)
Creatinine, Ser: 0.74 mg/dL (ref 0.61–1.24)
Creatinine, Ser: 0.93 mg/dL (ref 0.61–1.24)
GFR, Estimated: >60 mL/min (ref >60)
GFR, Estimated: >60 mL/min (ref >60)
Glucose, Bld: 101 mg/dL — ABNORMAL HIGH (ref 70–99)
Glucose, Bld: 107 mg/dL — ABNORMAL HIGH (ref 70–99)
Potassium: 3.4 mmol/L — ABNORMAL LOW (ref 3.5–5.1)
Potassium: 3.7 mmol/L (ref 3.5–5.1)
Sodium: 138 mmol/L (ref 135–145)
Sodium: 139 mmol/L (ref 135–145)

## 2022-12-03 LAB — CBC
HCT: 31.3 % — ABNORMAL LOW (ref 39.0–52.0)
Hemoglobin: 10.1 g/dL — ABNORMAL LOW (ref 13.0–17.0)
MCH: 31.3 pg (ref 26.0–34.0)
MCHC: 32.3 g/dL (ref 30.0–36.0)
MCV: 96.9 fL (ref 80.0–100.0)
Platelets: 241 10*3/uL (ref 150–400)
RBC: 3.23 MIL/uL — ABNORMAL LOW (ref 4.22–5.81)
RDW: 13.4 % (ref 11.5–15.5)
WBC: 9.4 10*3/uL (ref 4.0–10.5)
nRBC: 0 % (ref 0.0–0.2)

## 2022-12-03 LAB — PROTIME-INR
INR: 1.2 (ref 0.8–1.2)
Prothrombin Time: 15.2 seconds (ref 11.4–15.2)

## 2022-12-03 LAB — BLOOD GAS, VENOUS
Acid-Base Excess: 4.8 mmol/L — ABNORMAL HIGH (ref 0.0–2.0)
Bicarbonate: 29.9 mmol/L — ABNORMAL HIGH (ref 20.0–28.0)
O2 Saturation: 96.8 %
Patient temperature: 37
pCO2, Ven: 45 mmHg (ref 44–60)
pH, Ven: 7.43 (ref 7.25–7.43)
pO2, Ven: 69 mmHg — ABNORMAL HIGH (ref 32–45)

## 2022-12-03 LAB — LACTIC ACID, PLASMA: Lactic Acid, Venous: 0.7 mmol/L (ref 0.5–1.9)

## 2022-12-03 LAB — HIV ANTIBODY (ROUTINE TESTING W REFLEX): HIV Screen 4th Generation wRfx: NONREACTIVE

## 2022-12-03 LAB — APTT: aPTT: 33 seconds (ref 24–36)

## 2022-12-03 LAB — VITAMIN B12: Vitamin B-12: 567 pg/mL (ref 180–914)

## 2022-12-03 LAB — AMMONIA: Ammonia: 29 umol/L (ref 9–35)

## 2022-12-03 LAB — TSH: TSH: 0.482 u[IU]/mL (ref 0.350–4.500)

## 2022-12-03 MED ORDER — HYDROMORPHONE HCL 1 MG/ML IJ SOLN
0.5000 mg | INTRAMUSCULAR | Status: AC | PRN
  Administered 2022-12-03 (×2): 1 mg via INTRAVENOUS
  Filled 2022-12-03 (×2): qty 1

## 2022-12-03 MED ORDER — OXYCODONE-ACETAMINOPHEN 7.5-325 MG PO TABS
1.0000 | ORAL_TABLET | Freq: Three times a day (TID) | ORAL | Status: DC | PRN
  Administered 2022-12-03 – 2022-12-04 (×4): 1 via ORAL
  Filled 2022-12-03 (×4): qty 1

## 2022-12-03 MED ORDER — ATORVASTATIN CALCIUM 40 MG PO TABS
40.0000 mg | ORAL_TABLET | Freq: Every day | ORAL | Status: DC
  Administered 2022-12-03 – 2022-12-04 (×2): 40 mg via ORAL
  Filled 2022-12-03 (×2): qty 1

## 2022-12-03 MED ORDER — HYDROMORPHONE HCL 1 MG/ML IJ SOLN: 1.0000 mg | Freq: Once | INTRAMUSCULAR | Status: DC

## 2022-12-03 MED ORDER — BACLOFEN 10 MG PO TABS: 10.0000 mg | ORAL_TABLET | Freq: Four times a day (QID) | ORAL | Status: DC

## 2022-12-03 MED ORDER — LOSARTAN POTASSIUM 50 MG PO TABS
100.0000 mg | ORAL_TABLET | Freq: Every day | ORAL | Status: DC
  Administered 2022-12-03 – 2022-12-04 (×2): 100 mg via ORAL
  Filled 2022-12-03 (×2): qty 2

## 2022-12-03 MED ORDER — QUETIAPINE FUMARATE 25 MG PO TABS
25.0000 mg | ORAL_TABLET | Freq: Every day | ORAL | Status: DC
  Administered 2022-12-03: 25 mg via ORAL
  Filled 2022-12-03: qty 1

## 2022-12-03 MED ORDER — AMLODIPINE BESYLATE 5 MG PO TABS
5.0000 mg | ORAL_TABLET | Freq: Every day | ORAL | Status: DC
  Administered 2022-12-03 – 2022-12-04 (×2): 5 mg via ORAL
  Filled 2022-12-03 (×2): qty 1

## 2022-12-03 MED ORDER — PREGABALIN 75 MG PO CAPS
300.0000 mg | ORAL_CAPSULE | Freq: Two times a day (BID) | ORAL | Status: DC
  Administered 2022-12-03 – 2022-12-04 (×3): 300 mg via ORAL
  Filled 2022-12-03 (×3): qty 4

## 2022-12-03 MED ORDER — BACLOFEN 10 MG PO TABS
10.0000 mg | ORAL_TABLET | Freq: Three times a day (TID) | ORAL | Status: DC
  Administered 2022-12-03 – 2022-12-04 (×3): 10 mg via ORAL
  Filled 2022-12-03 (×3): qty 1

## 2022-12-03 MED ORDER — HALOPERIDOL LACTATE 5 MG/ML IJ SOLN
2.0000 mg | Freq: Four times a day (QID) | INTRAMUSCULAR | Status: DC | PRN
  Administered 2022-12-03: 2 mg via INTRAVENOUS
  Filled 2022-12-03: qty 1

## 2022-12-03 MED ORDER — ZOLPIDEM TARTRATE 10 MG PO TABS
10.0000 mg | ORAL_TABLET | Freq: Every evening | ORAL | Status: DC | PRN
  Administered 2022-12-03: 10 mg via ORAL
  Filled 2022-12-03: qty 1

## 2022-12-03 NOTE — ED Notes (Signed)
Patient is more quiet post medications.  He continues to position himself in the bed towards the head of the bed.

## 2022-12-03 NOTE — ED Notes (Signed)
Patient continues to try to get out of the top of the bed. He has been leaning over the edges.  He makes statements that "we hate him"  Patient states he "did not do anything"

## 2022-12-03 NOTE — ED Notes (Signed)
Bladder scan completed prior to placing foley catheter.  Patient had greater than 500cc in the bladder.  Upon catheterization, patient had immediate return of amber urine with 750cc.

## 2022-12-03 NOTE — Progress Notes (Signed)
PROGRESS NOTE  Mark Wells [id number] DOB: 1971-03-22   PCP: [facility]  Patient is from: Home  DOA: 12/02/2022 LOS: 0  Chief complaints No chief complaint on file.    Brief Narrative / Interim history: 51 year old M with PMH of CVA and right hemiparesis, cervical spinal stenosis, lumbar laminectomy, chronic back pain on opiate, depression, bipolar disorder, OSA on CPAP and Dupuytren contracture of left hand fingers presenting with priapism for 4 days.  Initially presented to [facility] on Monday and underwent irrigation for priapism.  He had return of priapism and presented to [facility] on Tuesday and transferred to [facility] ED. was seen by urology and had another irrigation.  However, priapism recurred after brief improvement and transferred to [facility] for inpatient urology evaluation and management.  Patient was evaluated by urology and priapism resolved.  However, patient had acute confusion and fever without clear source.  Urinalysis negative.  No respiratory symptoms.  Blood cultures NGTD.  COVID, influenza and RSV PCR nonreactive.  Subjective: Seen and examined earlier this morning.  Patient was confused and attempting to get out of the bed overnight.  He remains confused this morning.  He is awake and oriented to self and person but not place and time.  He follows commands.  He denies pain to me but reported pain to an Therapist, sports.  Responds no cough, shortness of breath, nausea or vomiting.  He has no insight into why he is in the hospital.   Objective: Vitals:   12/03/22 0400 12/03/22 0408 12/03/22 0418 12/03/22 0837  BP: 122/81  (!) 121/93 (!) 128/93  Pulse: (!) 107  (!) 104 99  Resp:  20 18 16   Temp:  99.9 F (37.7 C) 98.5 F (36.9 C) 99.1 F (37.3 C)  TempSrc:  Oral Oral Oral  SpO2: 93%  91% 95%  Weight:      Height:        Examination:  GENERAL: No apparent distress.  Nontoxic. HEENT: MMM.  Vision and hearing grossly intact.   NECK: Supple.  No apparent JVD.  RESP:  No IWOB.  Fair aeration bilaterally. CVS:  RRR. Heart sounds normal.  ABD/GI/GU: BS+. Abd soft, NTND.  Indwelling Foley. MSK/EXT:  Moves extremities but weaker in right leg.  Dupuytren contracture of left hand SKIN: no apparent skin lesion or wound NEURO: Awake.  Oriented to self and person but not place and time.  No facial asymmetry.  Some weakness in right leg.  No apparent focal neuro deficit. PSYCH: Calm but confused.  Procedures:  None  Microbiology summarized: [id number], influenza and RSV PCR nonreactive. Blood cultures NGTD  Assessment and plan: Principal Problem:   Priapism, drug-induced Active Problems:   Hypertension   OSA on CPAP   Low back pain   Major depression, recurrent, full remission (HCC)   Chronic, continuous use of opioids   Spastic hemiplegia affecting nondominant side (HCC)   Neuropathic pain   Confusion   Fever   Obesity (BMI 30-39.9)   History of CVA (cerebrovascular accident)  Recurrent priapism: Likely drug-induced from Cialis.  Seems to have resolved. -Urology following -Avoid Cialis or related meds -Continue holding Plavix for now -Discontinue IV fluid -Resume home amlodipine and losartan.  Acute urinary retention -Voiding trial -Continue Flomax  Chronic low back pain/history of lumbar laminectomy/cervical spinal stenosis/neuropathy/radiculopathy -Resume home pain Percocet, baclofen (reduced dose) and Lyrica -Tylenol as needed  Fever: Unclear source of this.  Confused but no meningism.  No respiratory symptoms.  Blood cultures NGTD.  UA negative.  COVID-19, influenza and RSV PCR nonreactive. -Continue monitoring -May consider evaluation for VTE or body imaging if fever recurs.  Confusion/delirium-awake but only oriented to self and person.  Not sure if this is iatrogenic or withdrawal.  Patient denies smoking cigarette, drinking alcohol recreational drug use.  Per wife, did not drink in about a  year.  Smokes about 10 cigarettes a day.  No recreational drug use.  Has some right leg weakness but chronic.  No meningism. -Reorientation and delirium precautions -Resume home medications -May consider some encephalopathy workup if no improvement -PT/OT eval  OSA on CPAP -Continue CPAP.  History of CVA: -Continue home Lipitor. -Plavix for now  Essential hypertension -Discontinue IV fluid -Continue amlodipine and losartan -Continue holding chlorthalidone  History of depression -Continue home meds.  Obesity Body mass index is 32.59 kg/m.           DVT prophylaxis:  SCDs Start: 12/02/22 2051  Code Status: Full code Family Communication: Updated patient's wife over the phone Level of care: Telemetry Status is: Observation The patient will require care spanning > 2 midnights and should be moved to inpatient because: fever, confusion/delirium   Final disposition: TBD Consultants:  Urology  Sch Meds:  Scheduled Meds:  amLODipine  5 mg Oral Daily   atorvastatin  40 mg Oral Daily   baclofen  10 mg Oral TID   losartan  100 mg Oral Daily   pregabalin  300 mg Oral BID   QUEtiapine  25 mg Oral QHS   sertraline  25 mg Oral Daily   tamsulosin  0.4 mg Oral Daily   Continuous Infusions: PRN Meds:.acetaminophen **OR** acetaminophen, haloperidol lactate, oxyCODONE-acetaminophen  Antimicrobials: Anti-infectives (From admission, onward)    None        I have personally reviewed the following labs and images: CBC: Recent Labs  Lab 12/02/22 1141 12/03/22 0345 12/03/22 0817  WBC 10.9* 10.4 9.4  NEUTROABS  --  7.1  --   HGB 11.6* 9.9* 10.1*  HCT 35.3* 31.1* 31.3*  MCV 94.9 96.9 96.9  PLT 271 244 241   BMP &GFR Recent Labs  Lab 12/02/22 2349 12/03/22 0330 12/03/22 0817  NA 139 140 138  K 3.7 3.5 3.4*  CL 102 102 99  CO2 27 27 28   GLUCOSE 107* 105* 101*  BUN 18 15 15   CREATININE 0.93 0.85 0.74  CALCIUM 9.1 9.0 8.8*   Estimated Creatinine  Clearance: 113.1 mL/min (by C-G formula based on SCr of 0.74 mg/dL). Liver & Pancreas: Recent Labs  Lab 12/03/22 0330  AST 14*  ALT 13  ALKPHOS 55  BILITOT 1.3*  PROT 7.1  ALBUMIN 3.3*   No results for input(s): "LIPASE", "AMYLASE" in the last 168 hours. No results for input(s): "AMMONIA" in the last 168 hours. Diabetic: No results for input(s): "HGBA1C" in the last 72 hours. No results for input(s): "GLUCAP" in the last 168 hours. Cardiac Enzymes: No results for input(s): "CKTOTAL", "CKMB", "CKMBINDEX", "TROPONINI" in the last 168 hours. No results for input(s): "PROBNP" in the last 8760 hours. Coagulation Profile: Recent Labs  Lab 12/03/22 0330  INR 1.2   Thyroid Function Tests: No results for input(s): "TSH", "T4TOTAL", "FREET4", "T3FREE", "THYROIDAB" in the last 72 hours. Lipid Profile: No results for input(s): "CHOL", "HDL", "LDLCALC", "TRIG", "CHOLHDL", "LDLDIRECT" in the last 72 hours. Anemia Panel: No results for input(s): "VITAMINB12", "FOLATE", "FERRITIN", "TIBC", "IRON", "RETICCTPCT" in the last 72 hours. Urine analysis:    Component  Value Date/Time   COLORURINE YELLOW 12/02/2022 2243   APPEARANCEUR CLEAR 12/02/2022 2243   APPEARANCEUR clear 06/16/2019 0000   LABSPEC 1.017 12/02/2022 2243   LABSPEC 1.010 09/25/2013 0111   PHURINE 6.0 12/02/2022 2243   GLUCOSEU NEGATIVE 12/02/2022 2243   GLUCOSEU Negative 09/25/2013 0111   HGBUR NEGATIVE 12/02/2022 2243   BILIRUBINUR NEGATIVE 12/02/2022 2243   BILIRUBINUR Negative 09/25/2013 0111   KETONESUR NEGATIVE 12/02/2022 2243   PROTEINUR NEGATIVE 12/02/2022 2243   UROBILINOGEN 0.2 02/11/2015 2118   NITRITE NEGATIVE 12/02/2022 2243   LEUKOCYTESUR NEGATIVE 12/02/2022 2243   LEUKOCYTESUR Negative 09/25/2013 0111   Sepsis Labs: Invalid input(s): "PROCALCITONIN", "LACTICIDVEN"  Microbiology: Recent Results (from the past 240 hour(s))  Resp panel by RT-PCR (RSV, Flu A&B, Covid) Anterior Nasal Swab     Status: None    Collection Time: 12/02/22  2:32 PM   Specimen: Anterior Nasal Swab  Result Value Ref Range Status   SARS Coronavirus 2 by RT PCR NEGATIVE NEGATIVE Final    Comment: (NOTE) SARS-CoV-2 target nucleic acids are NOT DETECTED.  The SARS-CoV-2 RNA is generally detectable in upper respiratory specimens during the acute phase of infection. The lowest concentration of SARS-CoV-2 viral copies this assay can detect is 138 copies/mL. A negative result does not preclude SARS-Cov-2 infection and should not be used as the sole basis for treatment or other patient management decisions. A negative result may occur with  improper specimen collection/handling, submission of specimen other than nasopharyngeal swab, presence of viral mutation(s) within the areas targeted by this assay, and inadequate number of viral copies(<138 copies/mL). A negative result must be combined with clinical observations, patient history, and epidemiological information. The expected result is Negative.  Fact Sheet for Patients:  EntrepreneurPulse.com.au  Fact Sheet for Healthcare Providers:  IncredibleEmployment.be  This test is no t yet approved or cleared by the [place] FDA and  has been authorized for detection and/or diagnosis of SARS-CoV-2 by FDA under an Emergency Use Authorization (EUA). This EUA will remain  in effect (meaning this test can be used) for the duration of the COVID-19 declaration under Section 564(b)(1) of the Act, 21 U.S.C.section 360bbb-3(b)(1), unless the authorization is terminated  or revoked sooner.       Influenza A by PCR NEGATIVE NEGATIVE Final   Influenza B by PCR NEGATIVE NEGATIVE Final    Comment: (NOTE) The Xpert Xpress SARS-CoV-2/FLU/RSV plus assay is intended as an aid in the diagnosis of influenza from Nasopharyngeal swab specimens and should not be used as a sole basis for treatment. Nasal washings and aspirates are unacceptable for  Xpert Xpress SARS-CoV-2/FLU/RSV testing.  Fact Sheet for Patients: EntrepreneurPulse.com.au  Fact Sheet for Healthcare Providers: IncredibleEmployment.be  This test is not yet approved or cleared by the [place] FDA and has been authorized for detection and/or diagnosis of SARS-CoV-2 by FDA under an Emergency Use Authorization (EUA). This EUA will remain in effect (meaning this test can be used) for the duration of the COVID-19 declaration under Section 564(b)(1) of the Act, 21 U.S.C. section 360bbb-3(b)(1), unless the authorization is terminated or revoked.     Resp Syncytial Virus by PCR NEGATIVE NEGATIVE Final    Comment: (NOTE) Fact Sheet for Patients: EntrepreneurPulse.com.au  Fact Sheet for Healthcare Providers: IncredibleEmployment.be  This test is not yet approved or cleared by the [place] FDA and has been authorized for detection and/or diagnosis of SARS-CoV-2 by FDA under an Emergency Use Authorization (EUA). This EUA will remain in effect (meaning this test  can be used) for the duration of the COVID-19 declaration under Section 564(b)(1) of the Act, 21 U.S.C. section 360bbb-3(b)(1), unless the authorization is terminated or revoked.  Performed at [facility] [address], [place], [place] [address]   Culture, blood (x 2)     Status: None (Preliminary result)   Collection Time: 12/03/22  3:25 AM   Specimen: BLOOD  Result Value Ref Range Status   Specimen Description   Final    BLOOD BLOOD RIGHT HAND Performed at [facility] [address] [place], [place] [address]    Special Requests   Final    BOTTLES DRAWN AEROBIC AND ANAEROBIC Blood Culture results may not be optimal due to an excessive volume of blood received in culture bottles Performed at [facility] [address] [place], [place] [address]    Culture   Final    NO GROWTH <  12 HOURS Performed at [facility] [address], [place], [place] [address]    Report Status PENDING  Incomplete  Culture, blood (x 2)     Status: None (Preliminary result)   Collection Time: 12/03/22  4:15 AM   Specimen: BLOOD  Result Value Ref Range Status   Specimen Description   Final    BLOOD BLOOD RIGHT HAND Performed at [facility] [address], [place], [place] [address]    Special Requests   Final    BOTTLES DRAWN AEROBIC AND ANAEROBIC Blood Culture results may not be optimal due to an excessive volume of blood received in culture bottles Performed at [facility] [address], [place], [place] [address]    Culture   Final    NO GROWTH < 12 HOURS Performed at [facility] [address], [place],  [address]    Report Status PENDING  Incomplete    Radiology Studies: DG Chest 1 View  Result Date: 12/02/2022 CLINICAL DATA:  Fever. EXAM: CHEST  1 VIEW COMPARISON:  None Available. FINDINGS: The heart size and mediastinal contours are within normal limits. Low lung volumes without evidence of focal consolidation or large pleural effusion. The visualized skeletal structures are unremarkable. IMPRESSION: Low lung volumes without evidence of acute cardiopulmonary process. Electronically Signed   By: Keane Police D.O.   On: 12/02/2022 17:58      Mark Wells T. [place]  If 7PM-7AM, please contact night-coverage [email or web address] 12/03/2022, 11:57 AM

## 2022-12-03 NOTE — Progress Notes (Signed)
Patient ID: Mark Wells, male   DOB: Jan 27, 1971, 51 y.o.   MRN: [id number]    Subjective: Pt still with pain in penis but seems much less agitated.  Wants to go home.  Was found to be in urinary retention last night with about 700 cc s/p catheter placement.  He is unsure if his pain "down there" is improved.  Objective: Vital signs in last 24 hours: Temp:  [98.1 F (36.7 C)-102.4 F (39.1 C)] 99.1 F (37.3 C) (12/09 0837) Pulse Rate:  [97-128] 99 (12/09 0837) Resp:  [14-26] 16 (12/09 0837) BP: (74-151)/(61-123) 128/93 (12/09 0837) SpO2:  [90 %-99 %] 95 % (12/09 0837) Weight:  [91.6 kg] 91.6 kg (12/08 1057)  Intake/Output from previous day: 12/08 0701 - 12/09 0700 In: 568 [I.V.:568] Out: -  Intake/Output this shift: No intake/output data recorded.  Physical Exam:  General: Still mildly agitated and confused but much improved compared to last night. GU: Penis is flaccid but woody consistent with history of priapism episodes, Foley in place with grossly clear urine  Lab Results: Recent Labs    12/02/22 1141 12/03/22 0345 12/03/22 0817  HGB 11.6* 9.9* 10.1*  HCT 35.3* 31.1* 31.3*   BMET Recent Labs    12/02/22 2349 12/03/22 0330  NA 139 140  K 3.7 3.5  CL 102 102  CO2 27 27  GLUCOSE 107* 105*  BUN 18 15  CREATININE 0.93 0.85  CALCIUM 9.1 9.0     Studies/Results: DG Chest 1 View  Result Date: 12/02/2022 CLINICAL DATA:  Fever. EXAM: CHEST  1 VIEW COMPARISON:  None Available. FINDINGS: The heart size and mediastinal contours are within normal limits. Low lung volumes without evidence of focal consolidation or large pleural effusion. The visualized skeletal structures are unremarkable. IMPRESSION: Low lung volumes without evidence of acute cardiopulmonary process. Electronically Signed   By: Larose Hires D.O.   On: 12/02/2022 17:58    Assessment/Plan: 1) Priapism: This has not been noted or been recurrent since he arrived at [facility].  Stop tadalafil. 2) Urinary  retention: On tamsulosin already.  Ok to try voiding trial today.  If he fails voiding trial, will consider increasing dose of tamsulosin.   LOS: 0 days   Crecencio Mc 12/03/2022, 9:03 AM

## 2022-12-03 NOTE — ED Notes (Signed)
Patient is moving around on the bed.  He continues to have confused speech at times.  Patient has a fever at this time.  102.4.  Will inform admitting provider.  Foley catheter remains in place.

## 2022-12-03 NOTE — ED Notes (Signed)
Patient is noted to chatter constantly, talking to no one in the room.  He is moving constantly in the bed.  He will respond to his name and when asked a questions, he become defensive.

## 2022-12-03 NOTE — ED Notes (Signed)
Contacted the floor, requested the purple man to be started

## 2022-12-03 NOTE — Progress Notes (Signed)
Bladder scan showed 60mL at this time. No interventions. On-call provider aware

## 2022-12-04 DIAGNOSIS — R509 Fever, unspecified: Secondary | ICD-10-CM | POA: Diagnosis not present

## 2022-12-04 DIAGNOSIS — Z8673 Personal history of transient ischemic attack (TIA), and cerebral infarction without residual deficits: Secondary | ICD-10-CM

## 2022-12-04 DIAGNOSIS — N4833 Priapism, drug-induced: Secondary | ICD-10-CM | POA: Diagnosis not present

## 2022-12-04 DIAGNOSIS — F1490 Cocaine use, unspecified, uncomplicated: Secondary | ICD-10-CM

## 2022-12-04 DIAGNOSIS — R410 Disorientation, unspecified: Secondary | ICD-10-CM | POA: Diagnosis not present

## 2022-12-04 DIAGNOSIS — F1190 Opioid use, unspecified, uncomplicated: Secondary | ICD-10-CM | POA: Diagnosis not present

## 2022-12-04 LAB — COMPREHENSIVE METABOLIC PANEL
ALT: 15 U/L (ref 0–44)
AST: 16 U/L (ref 15–41)
Albumin: 3.2 g/dL — ABNORMAL LOW (ref 3.5–5.0)
Alkaline Phosphatase: 56 U/L (ref 38–126)
Anion gap: 9 (ref 5–15)
BUN: 12 mg/dL (ref 6–20)
CO2: 31 mmol/L (ref 22–32)
Calcium: 9.1 mg/dL (ref 8.9–10.3)
Chloride: 97 mmol/L — ABNORMAL LOW (ref 98–111)
Creatinine, Ser: 0.67 mg/dL (ref 0.61–1.24)
GFR, Estimated: >60 mL/min (ref >60)
Glucose, Bld: 102 mg/dL — ABNORMAL HIGH (ref 70–99)
Potassium: 3.3 mmol/L — ABNORMAL LOW (ref 3.5–5.1)
Sodium: 137 mmol/L (ref 135–145)
Total Bilirubin: 1 mg/dL (ref 0.3–1.2)
Total Protein: 7 g/dL (ref 6.5–8.1)

## 2022-12-04 LAB — CBC WITH DIFFERENTIAL/PLATELET
Abs Immature Granulocytes: 0.02 10*3/uL (ref 0.00–0.07)
Basophils Absolute: 0.1 10*3/uL (ref 0.0–0.1)
Basophils Relative: 1 %
Eosinophils Absolute: 0.2 10*3/uL (ref 0.0–0.5)
Eosinophils Relative: 3 %
HCT: 31.8 % — ABNORMAL LOW (ref 39.0–52.0)
Hemoglobin: 10.4 g/dL — ABNORMAL LOW (ref 13.0–17.0)
Immature Granulocytes: 0 %
Lymphocytes Relative: 38 %
Lymphs Abs: 2.4 10*3/uL (ref 0.7–4.0)
MCH: 31.4 pg (ref 26.0–34.0)
MCHC: 32.7 g/dL (ref 30.0–36.0)
MCV: 96.1 fL (ref 80.0–100.0)
Monocytes Absolute: 0.8 10*3/uL (ref 0.1–1.0)
Monocytes Relative: 12 %
Neutro Abs: 3 10*3/uL (ref 1.7–7.7)
Neutrophils Relative %: 46 %
Platelets: 245 10*3/uL (ref 150–400)
RBC: 3.31 MIL/uL — ABNORMAL LOW (ref 4.22–5.81)
RDW: 13.2 % (ref 11.5–15.5)
WBC: 6.5 10*3/uL (ref 4.0–10.5)
nRBC: 0 % (ref 0.0–0.2)

## 2022-12-04 LAB — PHOSPHORUS: Phosphorus: 3.9 mg/dL (ref 2.5–4.6)

## 2022-12-04 LAB — FERRITIN: Ferritin: 233 ng/mL (ref 24–336)

## 2022-12-04 LAB — FOLATE: Folate: 13.8 ng/mL (ref >5.9)

## 2022-12-04 LAB — IRON AND TIBC
Iron: 40 ug/dL — ABNORMAL LOW (ref 45–182)
Saturation Ratios: 13 % — ABNORMAL LOW (ref 17.9–39.5)
TIBC: 298 ug/dL (ref 250–450)
UIBC: 258 ug/dL

## 2022-12-04 LAB — RETICULOCYTES
Immature Retic Fract: 22.7 % — ABNORMAL HIGH (ref 2.3–15.9)
RBC.: 3.3 MIL/uL — ABNORMAL LOW (ref 4.22–5.81)
Retic Count, Absolute: 78.5 10*3/uL (ref 19.0–186.0)
Retic Ct Pct: 2.4 % (ref 0.4–3.1)

## 2022-12-04 LAB — MAGNESIUM: Magnesium: 1.9 mg/dL (ref 1.7–2.4)

## 2022-12-04 LAB — CK: Total CK: 150 U/L (ref 49–397)

## 2022-12-04 MED ORDER — POTASSIUM CHLORIDE CRYS ER 20 MEQ PO TBCR
40.0000 meq | EXTENDED_RELEASE_TABLET | ORAL | Status: AC
  Administered 2022-12-04 (×2): 40 meq via ORAL
  Filled 2022-12-04 (×2): qty 2

## 2022-12-04 NOTE — TOC Initial Note (Addendum)
Transition of Care [facility]) - Initial/Assessment Note    Patient Details  Name: Mark Wells MRN: [id number] Date of Birth: 30-Dec-1970  Transition [facility]) CM/SW Contact:    Adrian Prows, RN Phone Number: [phone] 12/04/2022, 12:21 PM  Clinical Narrative:                 [facility] consult for SA counseling/education; spoke w/ pt in room and he agrees to receive resources; the pt says he is from home w/ his wife and plans to return at d/c; he says he has transportation; the pt says he has upper and lower dentures, and Rolator at home; he says he does not have HH services; the pt says he would like a BSC; Dr Alanda Slim notified and order placed; contacted Jasmine at [employer], and the equipment will be delivered to room; pt also agrees to HHPT/OT and he does not have an agency choice; notified Kandee Keen at [facility] for [facility] services; awaiting return call.  -1338- notified by Kandee Keen at [facility] unable to accept -1408- LVM for Belenda Cruise at [facility]; awaiting return call -1410- notified by Toney Reil at [facility] unable to accept -1412- notified by Laurelyn Sickle at [facility] unable to accept -1413- notified by Barbara Cower at [facility] unable to accept  -1437- Dr Alanda Slim notified unable to obtain agency for HHPT?OT; he says the pt can go to outpatient PT/OT; notified pt and wife and they will contact his PCP to set up these services; no TOC needs.   Patient Goals and CMS Choice        Expected Discharge Plan and Services           Expected Discharge Date: 12/04/22                                    Prior Living Arrangements/Services                       Activities of Daily Living      Permission Sought/Granted                  Emotional Assessment              Admission diagnosis:  Priapism [N48.30] Priapism, drug-induced [N48.33] Patient Active Problem List   Diagnosis Date Noted   Confusion 12/03/2022   Fever 12/03/2022   Obesity (BMI 30-39.9) 12/03/2022   History of  CVA (cerebrovascular accident) 12/03/2022   Priapism, drug-induced 12/02/2022   OSA on CPAP 12/09/2018   Major depression, recurrent, full remission (HCC) 12/09/2018   Insomnia 12/09/2018   Spastic hemiplegia of left dominant side as late effect of cerebral infarction (HCC) 12/07/2018   Painful orthopaedic hardware (HCC) 09/03/2018   Spastic hemiplegia affecting nondominant side (HCC) 01/24/2018   Chronic, continuous use of opioids 03/25/2014   Depression 03/25/2014   Neuropathic pain 01/28/2014   Low back pain 04/16/2013   Hernia, incisional 04/16/2013   BACK PAIN 01/07/2011   ADVERSE DRUG REACTION 01/01/2011   Hypertension 12/28/2010   Chronic pain 12/28/2010   OTHER URINARY INCONTINENCE 12/28/2010   PCP:  [facility] Pharmacy:   [facility] - [place], [place] - [address] [place] [address] Phone: [phone] Fax: [phone]  [facility] [address] [place] - [address] [place] [address] Phone: [phone] Fax: [phone]  [facility] -  [place], [place] - [address] [place] [address] Phone: [phone] Fax: [phone]  [facility] - [place], [place] - [address] [place] [address] Phone: [phone] Fax: [phone]     Social Determinants of Health (SDOH) Interventions    Readmission Risk Interventions     No data to display

## 2022-12-04 NOTE — Evaluation (Signed)
Occupational Therapy Evaluation Patient Details Name: Mark Wells MRN: [id number] DOB: 13-Aug-1971 Today's Date: 12/04/2022   History of Present Illness Patient is a 51 year old male who presented with a four day history of priapism to other health care facility resulting in patient being transitioned to [facility] inpatient for urology evaluation. Patient was noted to have acute confusion and fever after priapism was resolved. PMH: CVA with hemiparesis, depression, bipolar disorder, dupuytren contracture of L hand fingers, cervical spinal stenosis, lumbar laminectomy, chronic back pain   Clinical Impression   Patient is a 51 year old male who was admitted for above. Patient reported living at home with wife support with more physical assistance on "bad days". Patient reported wife assists with feeding, dressing, and bathing tasks at home. Patient reported he is able to go to the bathroom himself with power wheelchair. Patient was mod A for supine to sit on edge of bed with increased time. Patient noted to have tremor like movement in RLE, decreased safety awareness, decreased functional activity tolerance, increased pain in IV site on posterior L hand and generalized pain with all movements impacting participation in ADLs. Patients family not in room to endorse PLOF at this time.  Patient will need 24/7 caregiver support in next level of care to be most successful. Patient would continue to benefit from skilled OT services at this time while admitted and after d/c to address noted deficits in order to improve overall safety and independence in ADLs.       Recommendations for follow up therapy are one component of a multi-disciplinary discharge planning process, led by the attending physician.  Recommendations may be updated based on patient status, additional functional criteria and insurance authorization.   Follow Up Recommendations  Home health OT     Assistance Recommended at Discharge Frequent or  constant Supervision/Assistance  Patient can return home with the following Two people to help with walking and/or transfers;A lot of help with bathing/dressing/bathroom;Assistance with cooking/housework;Direct supervision/assist for medications management;Assist for transportation;Help with stairs or ramp for entrance;Direct supervision/assist for financial management;Assistance with feeding    Functional Status Assessment  Patient has had a recent decline in their functional status and demonstrates the ability to make significant improvements in function in a reasonable and predictable amount of time.  Equipment Recommendations  None recommended by OT (patient reporting having needed items at home)       Precautions / Restrictions Precautions Precautions: Fall Precaution Comments: personal brace for dupuytren contracture of L hand fingers Restrictions Weight Bearing Restrictions: No      Mobility Bed Mobility Overal bed mobility: Needs Assistance Bed Mobility: Supine to Sit, Sit to Supine     Supine to sit: Mod assist, HOB elevated Sit to supine: Mod assist   General bed mobility comments: with increased time and cues for proper positioning to transition into midline posture on EOB          Balance Overall balance assessment: Needs assistance Sitting-balance support: Single extremity supported, Feet supported Sitting balance-Leahy Scale: Poor Sitting balance - Comments: with tremor like movement in RLE impacting sitting balance as well Postural control: Left lateral lean             ADL either performed or assessed with clinical judgement   ADL Overall ADL's : Needs assistance/impaired Eating/Feeding: Minimal assistance;Bed level Eating/Feeding Details (indicate cue type and reason): patient was able to pick up and hold cup near lips to take small sips from straw with R hand. patient reported  that wife feeds him at home and has been asking for nursing to feed him  here. patient did not expand on why wife feeds him at home. Grooming: Bed level;Minimal assistance Grooming Details (indicate cue type and reason): RUE functionally able to reach areas for grooming tasks. Upper Body Bathing: Moderate assistance;Bed level   Lower Body Bathing: Maximal assistance;Bed level   Upper Body Dressing : Moderate assistance;Bed level   Lower Body Dressing: Maximal assistance;Bed level Lower Body Dressing Details (indicate cue type and reason): patient reported wife does this for him at home   Toilet Transfer Details (indicate cue type and reason): Patient was mod A for supine to sit on edge of bed with use of bed rails and increased time. patient was noted to have lean to L side sitting EOB with increased tremor like movement in RLE that did not respond to grounding or any attempts to calm it effecting patients balance. patient continued to report " ok let me stand" patietn was educated on safety with standing with tremors in good leg that patient reported "not normally this bad". patient returned to supine with tremor like movement stopping when foot left the floor. nurse made aware. +2 present for safety during session Toileting- Clothing Manipulation and Hygiene: Bed level;Total assistance                            Pertinent Vitals/Pain Pain Assessment Pain Assessment: Faces Faces Pain Scale: Hurts even more Pain Location: IV in L posterior hand and condom cath Pain Descriptors / Indicators: Grimacing, Discomfort, Moaning Pain Intervention(s): Limited activity within patient's tolerance, Monitored during session, Repositioned (nurse made aware)     Hand Dominance Right   Extremity/Trunk Assessment Upper Extremity Assessment Upper Extremity Assessment: Generalized weakness;RUE deficits/detail;LUE deficits/detail RUE Deficits / Details: ROM WFL. unable to test strength with patient having irritation from posterior palm IV site. nurse made aware. RUE:  Unable to fully assess due to pain LUE Deficits / Details: noted to have wrist bracve in place on L side. patient has a h/o duputrens contracture L fingers. patient noted to have tremor like movements when extending elbow unable to reach neutral by about 10 degrees with tightness noted. patient unable to AROM shoulder or tolerate PROM of shoulder at this time. LUE: Unable to fully assess due to pain LUE Coordination: decreased fine motor;decreased gross motor   Lower Extremity Assessment Lower Extremity Assessment: Defer to PT evaluation   Cervical / Trunk Assessment Cervical / Trunk Assessment: Normal   Communication Communication Communication: No difficulties   Cognition Arousal/Alertness: Awake/alert Behavior During Therapy: Flat affect Overall Cognitive Status: Difficult to assess             General Comments: Patient was oriented to day of week and hosptial. patient                Home Living Family/patient expects to be discharged to:: Private residence Living Arrangements: Spouse/significant other Available Help at Discharge: Family (patient reported wife is not home all the time.) Type of Home: House Home Access: Ramped entrance     Home Layout: One level     Bathroom Shower/Tub: Tub/shower unit             Additional Comments: powerwheelchair      Prior Functioning/Environment Prior Level of Function : Needs assist               ADLs Comments: patients wife helps with getting  dressed, and bathing. patient reported taking himself to the bathroom with powerwheelchair        OT Problem List: Decreased activity tolerance;Impaired balance (sitting and/or standing);Decreased safety awareness;Decreased knowledge of precautions;Impaired UE functional use;Pain      OT Treatment/Interventions: Self-care/ADL training;Energy conservation;Therapeutic exercise;DME and/or AE instruction;Therapeutic activities;Patient/family education;Balance training     OT Goals(Current goals can be found in the care plan section) Acute Rehab OT Goals Patient Stated Goal: none stated OT Goal Formulation: Patient unable to participate in goal setting Time For Goal Achievement: 12/18/22 Potential to Achieve Goals: Fair  OT Frequency: Min 2X/week       AM-PAC OT "6 Clicks" Daily Activity     Outcome Measure Help from another person eating meals?: A Little Help from another person taking care of personal grooming?: A Little Help from another person toileting, which includes using toliet, bedpan, or urinal?: Total Help from another person bathing (including washing, rinsing, drying)?: A Lot Help from another person to put on and taking off regular upper body clothing?: A Lot Help from another person to put on and taking off regular lower body clothing?: A Lot 6 Click Score: 13   End of Session Nurse Communication: Other (comment);Mobility status (ok to participate in session. updated after session)  Activity Tolerance: Patient limited by fatigue Patient left: in bed;with call bell/phone within reach;with bed alarm set  OT Visit Diagnosis: Unsteadiness on feet (R26.81);Pain;Feeding difficulties (R63.3)                Time: 0941-1000 OT Time Calculation (min): 19 min Charges:  OT General Charges $OT Visit: 1 Visit OT Evaluation $OT Eval Moderate Complexity: 1 Mod  Erendida Wrenn OTR/L, MS Acute Rehabilitation Department Office# [phone]   Selinda Flavin 12/04/2022, 10:29 AM

## 2022-12-04 NOTE — Evaluation (Signed)
Physical Therapy Evaluation Patient Details Name: Mark Wells MRN: [id number] DOB: 1971/12/11 Today's Date: 12/04/2022  History of Present Illness  Patient is a 51 year old male who presented with a four day history of priapism to other health care facility resulting in patient being transitioned to [facility] inpatient for urology evaluation. Patient was noted to have acute confusion and fever after priapism was resolved. PMH: CVA with hemiparesis, depression, bipolar disorder, dupuytren contracture of L hand fingers, cervical spinal stenosis, lumbar laminectomy, chronic back pain  Clinical Impression  Patient evaluated by Physical Therapy with no further acute PT needs identified. All education has been completed and the patient has no further questions.   Pt assisted to sitting EOB and then transferred to recliner.  Pt typically transfers to his electric scooter at baseline.  Spouse assists as needed.  Pt feels he is at baseline at this time.  No tremors observed during session (had some with OT earlier).  Pt feels comfortable with d/c home.  See below for any follow-up Physical Therapy or equipment needs. PT is signing off. Thank you for this referral.        Recommendations for follow up therapy are one component of a multi-disciplinary discharge planning process, led by the attending physician.  Recommendations may be updated based on patient status, additional functional criteria and insurance authorization.  Follow Up Recommendations No PT follow up      Assistance Recommended at Discharge Intermittent Supervision/Assistance  Patient can return home with the following  A little help with walking and/or transfers;A little help with bathing/dressing/bathroom    Equipment Recommendations None recommended by PT  Recommendations for Other Services       Functional Status Assessment Patient has had a recent decline in their functional status and demonstrates the ability to make significant  improvements in function in a reasonable and predictable amount of time.     Precautions / Restrictions Precautions Precautions: Fall Precaution Comments: personal brace for dupuytren contracture of L hand fingers Restrictions Weight Bearing Restrictions: No      Mobility  Bed Mobility Overal bed mobility: Needs Assistance Bed Mobility: Supine to Sit     Supine to sit: Min assist     General bed mobility comments: with increased time and assist for trunk upright from flat bed (like at home)    Transfers Overall transfer level: Needs assistance Equipment used: None Transfers: Sit to/from Stand Sit to Stand: Min assist           General transfer comment: provided light min assist due to pt's first time OOB, transferred to pt's stronger right side (set up bed to chair like pt would transfer at home), pt used R UE and armrests to self assist; reports transfer is similar to his baseline, feels spouse could manage him at home if needed    Ambulation/Gait                  Stairs            Wheelchair Mobility    Modified Rankin (Stroke Patients Only)       Balance                                             Pertinent Vitals/Pain Pain Assessment Pain Assessment: Faces Faces Pain Scale: Hurts even more Pain Location: head Pain Descriptors / Indicators: Headache Pain  Intervention(s): Monitored during session, RN gave pain meds during session, Repositioned    Home Living Family/patient expects to be discharged to:: Private residence Living Arrangements: Spouse/significant other Available Help at Discharge: Family (patient reported wife is not home all the time.) Type of Home: House Home Access: Ramped entrance       Home Layout: One level Home Equipment: Art gallery manager Additional Comments: powerwheelchair    Prior Function Prior Level of Function : Needs assist             Mobility Comments: able to stand pivot  transfer to electric scooter, sometimes needs assist from spouse ADLs Comments: patients wife helps with getting dressed, and bathing. patient reported taking himself to the bathroom with powerwheelchair     Hand Dominance   Dominant Hand: Right    Extremity/Trunk Assessment   Upper Extremity Assessment Upper Extremity Assessment: Generalized weakness;RUE deficits/detail;LUE deficits/detail RUE Deficits / Details: ROM WFL. unable to test strength with patient having irritation from posterior palm IV site. nurse made aware. RUE: Unable to fully assess due to pain LUE Deficits / Details: noted to have wrist bracve in place on L side. patient has a h/o duputrens contracture L fingers. patient noted to have tremor like movements when extending elbow unable to reach neutral by about 10 degrees with tightness noted. patient unable to AROM shoulder or tolerate PROM of shoulder at this time. LUE: Unable to fully assess due to pain LUE Coordination: decreased fine motor;decreased gross motor    Lower Extremity Assessment Lower Extremity Assessment: Generalized weakness;LLE deficits/detail LLE Deficits / Details: hx of left sided weakness per pt    Cervical / Trunk Assessment Cervical / Trunk Assessment: Normal  Communication   Communication: No difficulties  Cognition Arousal/Alertness: Awake/alert Behavior During Therapy: Flat affect                                   General Comments: cognition appears intact however pt only giving brief answers to questions        General Comments      Exercises     Assessment/Plan    PT Assessment Patient does not need any further PT services  PT Problem List         PT Treatment Interventions      PT Goals (Current goals can be found in the Care Plan section)  Acute Rehab PT Goals PT Goal Formulation: All assessment and education complete, DC therapy    Frequency       Co-evaluation               AM-PAC PT  "6 Clicks" Mobility  Outcome Measure Help needed turning from your back to your side while in a flat bed without using bedrails?: A Little Help needed moving from lying on your back to sitting on the side of a flat bed without using bedrails?: A Little Help needed moving to and from a bed to a chair (including a wheelchair)?: A Little Help needed standing up from a chair using your arms (e.g., wheelchair or bedside chair)?: A Little Help needed to walk in hospital room?: Total Help needed climbing 3-5 steps with a railing? : Total 6 Click Score: 14    End of Session Equipment Utilized During Treatment: Gait belt Activity Tolerance: Patient tolerated treatment well Patient left: in chair;with call bell/phone within reach;with chair alarm set Nurse Communication: Mobility status PT Visit Diagnosis: Unsteadiness  on feet (R26.81)    Time: 1014-1030 PT Time Calculation (min) (ACUTE ONLY): 16 min   Charges:   PT Evaluation $PT Eval Low Complexity: 1 Low         Kati PT, DPT Physical Therapist Acute Rehabilitation Services Preferred contact method: Secure Chat Weekend Pager Only: [phone] Office: [phone]   Janan Halter [place] 12/04/2022, 11:56 AM

## 2022-12-04 NOTE — Discharge Summary (Signed)
Physician Discharge Summary  Mark Wells [id number] DOB: May 06, 1971 DOA: 12/02/2022  PCP: [facility] date: 12/02/2022 Discharge date: 12/04/2022 Admitted From: Home Disposition: Home Recommendations for Outpatient Follow-up:  Follow up with PCP in in 1 to 2 weeks Outpatient follow-up with urology as needed  Check CMP and CBC in 1 to 2 weeks Please follow up on the following pending results: None  Home Health: Outpatient referral ordered as TOC not able to secure home health Equipment/Devices: 3 in 1 bedside commode.  Patient has other DME's  Discharge Condition: Stable CODE STATUS: Full code  Follow-up Information     [facility] an appointment as soon as possible for a visit in 1 week(s).   Contact information: [address] [place]  [address] [phone]         Cleon Gustin, MD. Schedule an appointment as soon as possible for a visit in 2 week(s).   Specialty: Urology Why: As needed for erectile dysfunction treatment Contact information: [address] [place] [address] [phone]                 Hospital course 51 year old M with PMH of CVA and right hemiparesis, cervical spinal stenosis, lumbar laminectomy, chronic back pain on opiate, depression, bipolar disorder, OSA on CPAP and Dupuytren contracture of left hand fingers presenting with priapism for 4 days.  Initially presented to [facility] on Monday and underwent irrigation for priapism.  He had return of priapism and presented to [facility] on Tuesday and transferred to [facility], ED. was seen by urology and had another irrigation.  However, priapism recurred after brief improvement and transferred to [facility] for inpatient urology evaluation and management.  Patient was evaluated by urology and priapism resolved.  However, patient had acute confusion and fever.  Infectious workup including COVID-19,  influenza, RSV PCR, blood cultures and urinalysis negative.  No respiratory symptoms.  UDS positive for cocaine.  On the day of discharge, confusion/encephalopathy resolved.  Fever resolved.  He is cleared for discharge by urology for outpatient follow-up.  He was advised to refrain from using Cialis and cocaine.  Advised to follow-up with urology if he wants to pursue treatment for erectile dysfunction.  Therapy recommended home health PT/OT but TOC not able to secure this.  Outpatient referral ordered.  See individual problem list below for more.   Problems addressed during this hospitalization Principal Problem:   Priapism, drug-induced Active Problems:   Hypertension   OSA on CPAP   Low back pain   Major depression, recurrent, full remission (HCC)   Chronic, continuous use of opioids   Spastic hemiplegia affecting nondominant side (HCC)   Neuropathic pain   Confusion   Fever   Obesity (BMI 30-39.9)   History of CVA (cerebrovascular accident)              Vital signs Vitals:   12/03/22 1218 12/03/22 1641 12/03/22 2034 12/04/22 0431  BP: 135/83 (!) 127/92 121/88 105/89  Pulse: 97 88 90 78  Temp: 98.9 F (37.2 C) 98.7 F (37.1 C) 98.3 F (36.8 C) 98.1 F (36.7 C)  Resp: 16 18 18 20   Height:      Weight:      SpO2: 94% 95% (!) 87% 93%  TempSrc: Oral Oral Oral Oral  BMI (Calculated):         Discharge exam  GENERAL: No apparent distress.  Nontoxic. HEENT: MMM.  Vision and hearing grossly intact.  NECK: Supple.  No apparent JVD.  RESP:  No IWOB.  Fair aeration bilaterally. CVS:  RRR. Heart sounds normal.  ABD/GI/GU: BS+. Abd soft, NTND.  MSK/EXT:  Moves extremities. No apparent deformity. No edema.  SKIN: no apparent skin lesion or wound NEURO: Awake and alert. Oriented x 4.  No apparent focal neuro deficit. PSYCH: Calm. Normal affect.   Discharge Instructions Discharge Instructions     Diet - low sodium heart healthy   Complete by: As directed     Discharge instructions   Complete by: As directed    It has been a pleasure taking care of you!  You were hospitalized due to recurrent priapism (erection) likely from taking Cialis along with cocaine.  We strongly recommend stopping Cialis and using cocaine.  You may follow-up with urology if you need treatment for erectile dysfunction.  Please follow-up with your primary care doctor in 1 to 2 weeks.    Take care,   Increase activity slowly   Complete by: As directed       Allergies as of 12/04/2022       Reactions   Aspirin Other (See Comments)   Has Gastric Ulcer Other reaction(s): jittery   Ibuprofen Other (See Comments)   Ulcers- bleeding Other reaction(s): rectal bleeding   Shellfish Allergy Swelling   Other reaction(s): SOB        Medication List     STOP taking these medications    tadalafil 20 MG tablet Commonly known as: CIALIS       TAKE these medications    amLODipine 5 MG tablet Commonly known as: NORVASC TAKE 1 TABLET BY MOUTH ONCE DAILY.   atorvastatin 40 MG tablet Commonly known as: LIPITOR TAKE 1 TABLET BY MOUTH ONCE DAILY AT BEDTIME FOR  CHOLESTEROL   baclofen 20 MG tablet Commonly known as: LIORESAL Take 20 mg by mouth 4 (four) times daily.   chlorthalidone 25 MG tablet Commonly known as: HYGROTON TAKE 1 TABLET BY MOUTH EVERY DAY   clopidogrel 75 MG tablet Commonly known as: PLAVIX TAKE 1 TABLET BY MOUTH ONCE DAILY.   losartan 100 MG tablet Commonly known as: COZAAR TAKE 1 TABLET BY MOUTH  DAILY   meclizine 25 MG tablet Commonly known as: ANTIVERT Take 1 tablet (25 mg total) by mouth 3 (three) times daily as needed for dizziness.   methocarbamol 500 MG tablet Commonly known as: ROBAXIN TAKE (1) TABLET EVERY SIX HOURS AS NEEDED FOR MUSCLE SPASMS.    - MAY MAKE DROWSY -   nystatin-triamcinolone ointment Commonly known as: MYCOLOG Apply 1 application topically 2 (two) times daily.   oxyCODONE-acetaminophen 7.5-325 MG  tablet Commonly known as: PERCOCET Take 1 tablet by mouth 3 (three) times daily as needed for severe pain.   potassium chloride 10 MEQ tablet Commonly known as: KLOR-CON TAKE 1 TABLET BY MOUTH TWICE  DAILY   pregabalin 300 MG capsule Commonly known as: LYRICA Take 300 mg by mouth 2 (two) times daily.   QUEtiapine 25 MG tablet Commonly known as: SEROQUEL Take 1 tablet by mouth at bedtime.   sertraline 50 MG tablet Commonly known as: ZOLOFT TAKE 1 TABLET BY MOUTH ONCE DAILY. MAY INCREASE TO 2 TABLETS DAILY IF NEEDED.   tamsulosin 0.4 MG Caps capsule Commonly known as: FLOMAX TAKE 1 CAPSULE BY MOUTH  ONCE DAILY               Durable Medical Equipment  (From admission, onward)  Start     Ordered   12/04/22 1225  For home use only DME Bedside commode  Once       Comments: 3 and 1 bedside commode  Question:  Patient needs a bedside commode to treat with the following condition  Answer:  Ambulatory dysfunction   12/04/22 1224            Consultations: Urology  Procedures/Studies:    DG Chest 1 View  Result Date: 12/02/2022 CLINICAL DATA:  Fever. EXAM: CHEST  1 VIEW COMPARISON:  None Available. FINDINGS: The heart size and mediastinal contours are within normal limits. Low lung volumes without evidence of focal consolidation or large pleural effusion. The visualized skeletal structures are unremarkable. IMPRESSION: Low lung volumes without evidence of acute cardiopulmonary process. Electronically Signed   By: Keane Police D.O.   On: 12/02/2022 17:58       The results of significant diagnostics from this hospitalization (including imaging, microbiology, ancillary and laboratory) are listed below for reference.     Microbiology: Recent Results (from the past 240 hour(s))  Resp panel by RT-PCR (RSV, Flu A&B, Covid) Anterior Nasal Swab     Status: None   Collection Time: 12/02/22  2:32 PM   Specimen: Anterior Nasal Swab  Result Value Ref Range  Status   SARS Coronavirus 2 by RT PCR NEGATIVE NEGATIVE Final    Comment: (NOTE) SARS-CoV-2 target nucleic acids are NOT DETECTED.  The SARS-CoV-2 RNA is generally detectable in upper respiratory specimens during the acute phase of infection. The lowest concentration of SARS-CoV-2 viral copies this assay can detect is 138 copies/mL. A negative result does not preclude SARS-Cov-2 infection and should not be used as the sole basis for treatment or other patient management decisions. A negative result may occur with  improper specimen collection/handling, submission of specimen other than nasopharyngeal swab, presence of viral mutation(s) within the areas targeted by this assay, and inadequate number of viral copies(<138 copies/mL). A negative result must be combined with clinical observations, patient history, and epidemiological information. The expected result is Negative.  Fact Sheet for Patients:  EntrepreneurPulse.com.au  Fact Sheet for Healthcare Providers:  IncredibleEmployment.be  This test is no t yet approved or cleared by the [place] FDA and  has been authorized for detection and/or diagnosis of SARS-CoV-2 by FDA under an Emergency Use Authorization (EUA). This EUA will remain  in effect (meaning this test can be used) for the duration of the COVID-19 declaration under Section 564(b)(1) of the Act, 21 U.S.C.section 360bbb-3(b)(1), unless the authorization is terminated  or revoked sooner.       Influenza A by PCR NEGATIVE NEGATIVE Final   Influenza B by PCR NEGATIVE NEGATIVE Final    Comment: (NOTE) The Xpert Xpress SARS-CoV-2/FLU/RSV plus assay is intended as an aid in the diagnosis of influenza from Nasopharyngeal swab specimens and should not be used as a sole basis for treatment. Nasal washings and aspirates are unacceptable for Xpert Xpress SARS-CoV-2/FLU/RSV testing.  Fact Sheet for  Patients: EntrepreneurPulse.com.au  Fact Sheet for Healthcare Providers: IncredibleEmployment.be  This test is not yet approved or cleared by the [place] FDA and has been authorized for detection and/or diagnosis of SARS-CoV-2 by FDA under an Emergency Use Authorization (EUA). This EUA will remain in effect (meaning this test can be used) for the duration of the COVID-19 declaration under Section 564(b)(1) of the Act, 21 U.S.C. section 360bbb-3(b)(1), unless the authorization is terminated or revoked.     Resp Syncytial Virus  by PCR NEGATIVE NEGATIVE Final    Comment: (NOTE) Fact Sheet for Patients: EntrepreneurPulse.com.au  Fact Sheet for Healthcare Providers: IncredibleEmployment.be  This test is not yet approved or cleared by the [place] FDA and has been authorized for detection and/or diagnosis of SARS-CoV-2 by FDA under an Emergency Use Authorization (EUA). This EUA will remain in effect (meaning this test can be used) for the duration of the COVID-19 declaration under Section 564(b)(1) of the Act, 21 U.S.C. section 360bbb-3(b)(1), unless the authorization is terminated or revoked.  Performed at [facility] [address] [place], [place] [address]   Culture, blood (x 2)     Status: None (Preliminary result)   Collection Time: 12/03/22  3:25 AM   Specimen: BLOOD  Result Value Ref Range Status   Specimen Description   Final    BLOOD BLOOD RIGHT HAND Performed at [facility] [address] [place], [place] [address]    Special Requests   Final    BOTTLES DRAWN AEROBIC AND ANAEROBIC Blood Culture results may not be optimal due to an excessive volume of blood received in culture bottles Performed at [facility] [address], [place], [place] [address]    Culture   Final    NO GROWTH 1 DAY Performed at [facility], [place] [address]  [place], [place] [address]    Report Status PENDING  Incomplete  Culture, blood (x 2)     Status: None (Preliminary result)   Collection Time: 12/03/22  4:15 AM   Specimen: BLOOD  Result Value Ref Range Status   Specimen Description   Final    BLOOD BLOOD RIGHT HAND Performed at [facility] [address], [place], [place] [address]    Special Requests   Final    BOTTLES DRAWN AEROBIC AND ANAEROBIC Blood Culture results may not be optimal due to an excessive volume of blood received in culture bottles Performed at [facility] [address] [place], [place] [address]    Culture   Final    NO GROWTH 1 DAY Performed at [facility], [place] [address], [place], [place] [address]    Report Status PENDING  Incomplete     Labs:  CBC: Recent Labs  Lab 12/02/22 1141 12/03/22 0345 12/03/22 0817 12/04/22 0619  WBC 10.9* 10.4 9.4 6.5  NEUTROABS  --  7.1  --  3.0  HGB 11.6* 9.9* 10.1* 10.4*  HCT 35.3* 31.1* 31.3* 31.8*  MCV 94.9 96.9 96.9 96.1  PLT 271 244 241 245   BMP &GFR Recent Labs  Lab 12/02/22 2349 12/03/22 0330 12/03/22 0817 12/04/22 0619  NA 139 140 138 137  K 3.7 3.5 3.4* 3.3*  CL 102 102 99 97*  CO2 27 27 28 31   GLUCOSE 107* 105* 101* 102*  BUN 18 15 15 12   CREATININE 0.93 0.85 0.74 0.67  CALCIUM 9.1 9.0 8.8* 9.1  MG  --   --   --  1.9  PHOS  --   --   --  3.9   Estimated Creatinine Clearance: 113.1 mL/min (by C-G formula based on SCr of 0.67 mg/dL). Liver & Pancreas: Recent Labs  Lab 12/03/22 0330 12/04/22 0619  AST 14* 16  ALT 13 15  ALKPHOS 55 56  BILITOT 1.3* 1.0  PROT 7.1 7.0  ALBUMIN 3.3* 3.2*   No results for input(s): "LIPASE", "AMYLASE" in the last 168 hours. Recent Labs  Lab 12/03/22  1239  AMMONIA 29   Diabetic: No results for input(s): "HGBA1C" in the last 72 hours. No results for input(s): "GLUCAP" in the last 168 hours. Cardiac Enzymes: Recent Labs  Lab 12/04/22 0619  CKTOTAL 150    No results for input(s): "PROBNP" in the last 8760 hours. Coagulation Profile: Recent Labs  Lab 12/03/22 0330  INR 1.2   Thyroid Function Tests: Recent Labs    12/03/22 1239  TSH 0.482   Lipid Profile: No results for input(s): "CHOL", "HDL", "LDLCALC", "TRIG", "CHOLHDL", "LDLDIRECT" in the last 72 hours. Anemia Panel: Recent Labs    12/03/22 1239 12/04/22 0619  VITAMINB12 567  --   FOLATE  --  13.8  FERRITIN  --  233  TIBC  --  298  IRON  --  40*  RETICCTPCT  --  2.4   Urine analysis:    Component Value Date/Time   COLORURINE YELLOW 12/02/2022 2243   APPEARANCEUR CLEAR 12/02/2022 2243   APPEARANCEUR clear 06/16/2019 0000   LABSPEC 1.017 12/02/2022 2243   LABSPEC 1.010 09/25/2013 0111   PHURINE 6.0 12/02/2022 2243   GLUCOSEU NEGATIVE 12/02/2022 2243   GLUCOSEU Negative 09/25/2013 0111   HGBUR NEGATIVE 12/02/2022 2243   BILIRUBINUR NEGATIVE 12/02/2022 2243   BILIRUBINUR Negative 09/25/2013 0111   KETONESUR NEGATIVE 12/02/2022 2243   PROTEINUR NEGATIVE 12/02/2022 2243   UROBILINOGEN 0.2 02/11/2015 2118   NITRITE NEGATIVE 12/02/2022 2243   LEUKOCYTESUR NEGATIVE 12/02/2022 2243   LEUKOCYTESUR Negative 09/25/2013 0111   Sepsis Labs: Invalid input(s): "PROCALCITONIN", "LACTICIDVEN"   SIGNED:  Almon Hercules, MD  [facility] 12/04/2022, 2:55 PM

## 2022-12-04 NOTE — Progress Notes (Signed)
Patient ID: Mark Wells, male   DOB: 09/13/1971, 51 y.o.   MRN: [id number]    Subjective: No complaints this morning.  Denies significant penile pain.  Catheter removed yesterday.  UOP low but bladder scan low.  Objective: Vital signs in last 24 hours: Temp:  [98.1 F (36.7 C)-99.1 F (37.3 C)] 98.1 F (36.7 C) (12/10 0431) Pulse Rate:  [78-99] 78 (12/10 0431) Resp:  [16-20] 20 (12/10 0431) BP: (105-135)/(83-93) 105/89 (12/10 0431) SpO2:  [87 %-95 %] 93 % (12/10 0431)  Intake/Output from previous day: 12/09 0701 - 12/10 0700 In: 118 [P.O.:118] Out: 225 [Urine:225] Intake/Output this shift: No intake/output data recorded.  Physical Exam:  General: Resting comfortably GU: External catheter in place with no urine in bag, penis is flaccid, still woody but without evidence of erection  Lab Results: Recent Labs    12/03/22 0345 12/03/22 0817 12/04/22 0619  HGB 9.9* 10.1* 10.4*  HCT 31.1* 31.3* 31.8*   BMET Recent Labs    12/03/22 0817 12/04/22 0619  NA 138 137  K 3.4* 3.3*  CL 99 97*  CO2 28 31  GLUCOSE 101* 102*  BUN 15 12  CREATININE 0.74 0.67  CALCIUM 8.8* 9.1     Studies/Results: DG Chest 1 View  Result Date: 12/02/2022 CLINICAL DATA:  Fever. EXAM: CHEST  1 VIEW COMPARISON:  None Available. FINDINGS: The heart size and mediastinal contours are within normal limits. Low lung volumes without evidence of focal consolidation or large pleural effusion. The visualized skeletal structures are unremarkable. IMPRESSION: Low lung volumes without evidence of acute cardiopulmonary process. Electronically Signed   By: Larose Hires D.O.   On: 12/02/2022 17:58    Drug screen positive for cocaine  Assessment/Plan: 1) Recurrent priapism:  He has not had priapism since admission here.  Discussed possible etiologies with him.  Sickle cell screen is pending and will need to be followed up on.  Otherwise, polysubstance abuse with PDE-5 inhibitors may be the most likely factor  considering his drug screen.  Encouraged him to avoid illicit drug use and PDE-5 inhibitors for now.  He can f/u with Dr. Ronne Binning prn if he wishes to consider further treatment for erectile dysfunction or if he has recurrent priapism episodes in the future.    LOS: 1 day   Mark Wells 12/04/2022, 7:50 AM

## 2022-12-06 LAB — SICKLE CELL SCREEN: Sickle Cell Screen: NEGATIVE

## 2022-12-08 LAB — CULTURE, BLOOD (ROUTINE X 2)
Culture: NO GROWTH
Culture: NO GROWTH

## 2022-12-13 ENCOUNTER — Other Ambulatory Visit: Payer: Self-pay | Admitting: Urology

## 2022-12-13 DIAGNOSIS — N401 Enlarged prostate with lower urinary tract symptoms: Secondary | ICD-10-CM

## 2022-12-18 ENCOUNTER — Other Ambulatory Visit: Payer: Self-pay | Admitting: Urology

## 2022-12-18 DIAGNOSIS — N401 Enlarged prostate with lower urinary tract symptoms: Secondary | ICD-10-CM

## 2022-12-28 ENCOUNTER — Other Ambulatory Visit: Payer: Self-pay | Admitting: Urology

## 2022-12-28 DIAGNOSIS — N401 Enlarged prostate with lower urinary tract symptoms: Secondary | ICD-10-CM

## 2023-01-08 ENCOUNTER — Other Ambulatory Visit: Payer: Self-pay | Admitting: Urology

## 2023-01-08 DIAGNOSIS — N401 Enlarged prostate with lower urinary tract symptoms: Secondary | ICD-10-CM

## 2023-01-16 ENCOUNTER — Other Ambulatory Visit: Payer: Self-pay | Admitting: Urology

## 2023-01-16 DIAGNOSIS — N401 Enlarged prostate with lower urinary tract symptoms: Secondary | ICD-10-CM

## 2023-02-03 ENCOUNTER — Other Ambulatory Visit: Payer: Self-pay | Admitting: Urology

## 2023-02-03 DIAGNOSIS — N401 Enlarged prostate with lower urinary tract symptoms: Secondary | ICD-10-CM

## 2023-02-10 ENCOUNTER — Other Ambulatory Visit: Payer: Self-pay | Admitting: Urology

## 2023-02-10 DIAGNOSIS — N401 Enlarged prostate with lower urinary tract symptoms: Secondary | ICD-10-CM

## 2023-02-27 ENCOUNTER — Other Ambulatory Visit: Payer: Self-pay | Admitting: Urology

## 2023-02-27 DIAGNOSIS — N401 Enlarged prostate with lower urinary tract symptoms: Secondary | ICD-10-CM

## 2023-03-01 ENCOUNTER — Other Ambulatory Visit: Payer: Self-pay | Admitting: Urology

## 2023-03-01 DIAGNOSIS — N401 Enlarged prostate with lower urinary tract symptoms: Secondary | ICD-10-CM

## 2023-03-14 ENCOUNTER — Encounter: Payer: Self-pay | Admitting: Urology

## 2023-03-14 ENCOUNTER — Other Ambulatory Visit: Payer: Self-pay | Admitting: Urology

## 2023-03-14 ENCOUNTER — Ambulatory Visit (INDEPENDENT_AMBULATORY_CARE_PROVIDER_SITE_OTHER): Payer: 59 | Admitting: Urology

## 2023-03-14 VITALS — Ht 66.0 in | Wt 200.0 lb

## 2023-03-14 DIAGNOSIS — N401 Enlarged prostate with lower urinary tract symptoms: Secondary | ICD-10-CM

## 2023-03-14 DIAGNOSIS — R399 Unspecified symptoms and signs involving the genitourinary system: Secondary | ICD-10-CM | POA: Diagnosis not present

## 2023-03-14 DIAGNOSIS — N529 Male erectile dysfunction, unspecified: Secondary | ICD-10-CM | POA: Diagnosis not present

## 2023-03-14 MED ORDER — TADALAFIL 5 MG PO TABS: 5.0000 mg | ORAL_TABLET | Freq: Every day | ORAL | 11 refills | Status: DC | PRN

## 2023-03-14 NOTE — Progress Notes (Signed)
   03/14/2023 3:58 PM   Mark Wells [birth year] [id number]  Reason for visit: Follow up erectile dysfunction, urinary symptoms, history of priapism  HPI: 52 year old very comorbid male with history of stroke with significant deficits who uses a wheelchair.  When I last saw him in September 2022 we tried 10 to 20 mg Cialis on demand which was working well for him.   He reportedly was hospitalized in December for priapism and underwent aspiration, irrigation, and instillation of phenylephrine with Dr. Alyson Ingles in [place], and was also seen at [facility] in [place] by Dr. Alinda Money a few days later.  Sickle cell screen was negative.  He developed urinary retention but passed a voiding trial.  Apparently he was using cocaine at the time of developing priapism, drug test was positive, and he thinks he may have taken a very high dose of Cialis but someone else gave him the dose.  He is a very challenging and poor historian.  We discussed the risk of priapism with Cialis, however some research recommends low-dose Cialis is actually preventative for priapism.  We discussed the risk of developing recurrent priapism.  He is interested in going back on the Cialis and understands the risk.  I recommended starting at the lowest dose of 5 mg.  We also discussed alternatives like a penile prosthesis, and that he would need to see Dr. Cain Sieve in [place] if he was interested in this in the future.  He denies any urinary symptoms today, no incontinence.  He is interested in having further children.  We discussed the concept of retrograde ejaculation on his Flomax, and he was interested in a semen analysis to see if he was still fertile.  Okay to resume low-dose Cialis for ED, risks of developing priapism discussed, counseled to avoid cocaine and other drugs Okay to check a semen analysis to evaluate for fertility    [place], [name] [address] [place], [place] [address] [phone]

## 2023-11-21 ENCOUNTER — Other Ambulatory Visit: Payer: Self-pay | Admitting: Urology

## 2023-11-21 DIAGNOSIS — N401 Enlarged prostate with lower urinary tract symptoms: Secondary | ICD-10-CM

## 2023-12-16 ENCOUNTER — Other Ambulatory Visit: Payer: Self-pay | Admitting: Urology

## 2023-12-16 DIAGNOSIS — N401 Enlarged prostate with lower urinary tract symptoms: Secondary | ICD-10-CM

## 2024-04-22 ENCOUNTER — Emergency Department (HOSPITAL_COMMUNITY)
Admission: EM | Admit: 2024-04-22 | Discharge: 2024-04-23 | Disposition: A | Discharge status: Home / Self Care | Attending: Emergency Medicine | Admitting: Emergency Medicine

## 2024-04-22 ENCOUNTER — Other Ambulatory Visit: Payer: Self-pay

## 2024-04-22 ENCOUNTER — Emergency Department (HOSPITAL_COMMUNITY)

## 2024-04-22 DIAGNOSIS — Z8616 Personal history of COVID-19: Secondary | ICD-10-CM | POA: Diagnosis not present

## 2024-04-22 DIAGNOSIS — W19XXXA Unspecified fall, initial encounter: Secondary | ICD-10-CM | POA: Diagnosis not present

## 2024-04-22 DIAGNOSIS — R1030 Lower abdominal pain, unspecified: Secondary | ICD-10-CM | POA: Diagnosis present

## 2024-04-22 DIAGNOSIS — Y92009 Unspecified place in unspecified non-institutional (private) residence as the place of occurrence of the external cause: Secondary | ICD-10-CM | POA: Diagnosis not present

## 2024-04-22 DIAGNOSIS — L03311 Cellulitis of abdominal wall: Secondary | ICD-10-CM | POA: Insufficient documentation

## 2024-04-22 LAB — CBC WITH DIFFERENTIAL/PLATELET
Abs Immature Granulocytes: 0.02 10*3/uL (ref 0.00–0.07)
Basophils Absolute: 0.1 10*3/uL (ref 0.0–0.1)
Basophils Relative: 1 %
Eosinophils Absolute: 0.3 10*3/uL (ref 0.0–0.5)
Eosinophils Relative: 4 %
HCT: 38.8 % — ABNORMAL LOW (ref 39.0–52.0)
Hemoglobin: 12.1 g/dL — ABNORMAL LOW (ref 13.0–17.0)
Immature Granulocytes: 0 %
Lymphocytes Relative: 37 %
Lymphs Abs: 3 10*3/uL (ref 0.7–4.0)
MCH: 27.6 pg (ref 26.0–34.0)
MCHC: 31.2 g/dL (ref 30.0–36.0)
MCV: 88.6 fL (ref 80.0–100.0)
Monocytes Absolute: 0.7 10*3/uL (ref 0.1–1.0)
Monocytes Relative: 9 %
Neutro Abs: 4 10*3/uL (ref 1.7–7.7)
Neutrophils Relative %: 49 %
Platelets: 477 10*3/uL — ABNORMAL HIGH (ref 150–400)
RBC: 4.38 MIL/uL (ref 4.22–5.81)
RDW: 14.6 % (ref 11.5–15.5)
WBC: 8.1 10*3/uL (ref 4.0–10.5)
nRBC: 0 % (ref 0.0–0.2)

## 2024-04-22 LAB — URINALYSIS, ROUTINE W REFLEX MICROSCOPIC
Bilirubin Urine: NEGATIVE
Glucose, UA: NEGATIVE mg/dL
Hgb urine dipstick: NEGATIVE
Ketones, ur: NEGATIVE mg/dL
Leukocytes,Ua: NEGATIVE
Nitrite: NEGATIVE
Protein, ur: NEGATIVE mg/dL
Specific Gravity, Urine: 1.032 — ABNORMAL HIGH (ref 1.005–1.030)
pH: 5 (ref 5.0–8.0)

## 2024-04-22 LAB — COMPREHENSIVE METABOLIC PANEL WITH GFR
ALT: 11 U/L (ref 0–44)
AST: 17 U/L (ref 15–41)
Albumin: 3.8 g/dL (ref 3.5–5.0)
Alkaline Phosphatase: 107 U/L (ref 38–126)
Anion gap: 14 (ref 5–15)
BUN: 8 mg/dL (ref 6–20)
CO2: 20 mmol/L — ABNORMAL LOW (ref 22–32)
Calcium: 9.5 mg/dL (ref 8.9–10.3)
Chloride: 99 mmol/L (ref 98–111)
Creatinine, Ser: 0.61 mg/dL (ref 0.61–1.24)
GFR, Estimated: >60 mL/min (ref >60)
Glucose, Bld: 107 mg/dL — ABNORMAL HIGH (ref 70–99)
Potassium: 3.4 mmol/L — ABNORMAL LOW (ref 3.5–5.1)
Sodium: 133 mmol/L — ABNORMAL LOW (ref 135–145)
Total Bilirubin: 0.6 mg/dL (ref 0.0–1.2)
Total Protein: 8.1 g/dL (ref 6.5–8.1)

## 2024-04-22 LAB — LIPASE, BLOOD: Lipase: 28 U/L (ref 11–51)

## 2024-04-22 MED ORDER — ACETAMINOPHEN 500 MG PO TABS
1000.0000 mg | ORAL_TABLET | Freq: Once | ORAL | Status: AC
Start: 2024-04-22 — End: 2024-04-22
  Administered 2024-04-22: 1000 mg via ORAL
  Filled 2024-04-22: qty 2

## 2024-04-22 MED ORDER — IOHEXOL 300 MG/ML  SOLN
100.0000 mL | Freq: Once | INTRAMUSCULAR | Status: AC | PRN
  Administered 2024-04-22: 100 mL via INTRAVENOUS

## 2024-04-22 MED ORDER — OXYCODONE HCL 5 MG PO TABS
5.0000 mg | ORAL_TABLET | Freq: Once | ORAL | Status: AC
  Administered 2024-04-22: 5 mg via ORAL
  Filled 2024-04-22: qty 1

## 2024-04-22 NOTE — ED Notes (Signed)
 Pt refusing to stay in bed, asking for pain medication so he leave. RN advised pt to stay in bed as he is a high fall risk. Pt continues to refuse.

## 2024-04-22 NOTE — ED Notes (Signed)
 Pt found half way out of bed, legs hanging off about to slide into floor. Staff assisted pt back in bed, pt states he wants to walk out and "find transportation". RN advised pt against this as pt is very unsteady on feet. Charge RN aware.

## 2024-04-22 NOTE — ED Provider Notes (Signed)
 [place] EMERGENCY DEPARTMENT AT [facility] Provider Note   CSN: [id number] Arrival date & time: 04/22/24  1935     History  Chief Complaint  Patient presents with   Mark Wells    Mark Wells is a 53 y.o. male with PMH as listed below who presents by EMS from [facility]. Patient called 911 himself, states facility was not calling EMS for him or treating him. Unwitnessed fall. Patient denies syncopal episode. Fell on his abdomen where his staples are. Patient endorsing pain at abdominal site in the inferior portion and has noticed some redness there, is also concerned for infection. No purulent drainage noted from wound, denies f/c. No blood thinners. Ambulates with assistance at baseline.  He denies any head trauma or loss of consciousness.   Past Medical History:  Diagnosis Date   Anemia    Anxiety    Bipolar disorder (HCC)    Chronic back pain    COVID-19 06/2019   July 2020   Depression    Hemiplegia affecting left nondominant side (HCC)    Hyperlipidemia    Mood disorder (HCC)    Personality disorder (HCC)    Restless leg syndrome    Sleep apnea    Stroke [facility])        Home Medications Prior to Admission medications   Medication Sig Start Date End Date Taking? Authorizing Provider  clindamycin  (CLEOCIN ) 150 MG capsule Take 3 capsules (450 mg total) by mouth 3 (three) times daily. 04/23/24  Yes Merdis Stalling, MD  amLODipine  (NORVASC ) 5 MG tablet TAKE 1 TABLET BY MOUTH ONCE DAILY. 09/17/21   Karamalegos, Kayleen Party, DO  atorvastatin  (LIPITOR) 40 MG tablet TAKE 1 TABLET BY MOUTH ONCE DAILY AT BEDTIME FOR  CHOLESTEROL 12/10/21   Karamalegos, Kayleen Party, DO  baclofen  (LIORESAL ) 20 MG tablet Take 20 mg by mouth 4 (four) times daily. 03/21/19   [provider]  chlorthalidone  (HYGROTON ) 25 MG tablet TAKE 1 TABLET BY MOUTH EVERY DAY 04/02/21   Karamalegos, Kayleen Party, DO  clopidogrel  (PLAVIX ) 75 MG tablet TAKE 1 TABLET BY MOUTH ONCE DAILY. 05/06/21    Karamalegos, Kayleen Party, DO  losartan  (COZAAR ) 100 MG tablet TAKE 1 TABLET BY MOUTH  DAILY 12/10/21   Karamalegos, Kayleen Party, DO  methocarbamol  (ROBAXIN ) 500 MG tablet TAKE (1) TABLET EVERY SIX HOURS AS NEEDED FOR MUSCLE SPASMS.    - MAY MAKE DROWSY - 05/25/21   Raina Bunting, DO  oxyCODONE -acetaminophen  (PERCOCET) 7.5-325 MG tablet Take 1 tablet by mouth 3 (three) times daily as needed for severe pain.    [provider]  potassium chloride  (KLOR-CON ) 10 MEQ tablet TAKE 1 TABLET BY MOUTH TWICE  DAILY 12/10/21   Romeo Co, Kayleen Party, DO  pregabalin  (LYRICA ) 300 MG capsule Take 300 mg by mouth 2 (two) times daily.     [provider]  QUEtiapine  (SEROQUEL ) 50 MG tablet Take 1 tablet by mouth at bedtime.    [provider]  sertraline  (ZOLOFT ) 100 MG tablet Take 150 mg by mouth daily.    [provider]  tadalafil  (CIALIS ) 5 MG tablet Take 1 tablet (5 mg total) by mouth daily as needed for erectile dysfunction. 03/14/23   Lawerence Pressman, MD  tamsulosin  (FLOMAX ) 0.4 MG CAPS capsule TAKE 1 CAPSULE BY MOUTH ONCE  DAILY 12/19/23   Lawerence Pressman, MD  zolpidem  (AMBIEN ) 10 MG tablet Take 1 tablet by mouth at bedtime as needed for sleep. 03/07/23   [provider]      Allergies    Aspirin, Ibuprofen, and Shellfish allergy    Review of Systems   Review of Systems A 10 point review of systems was performed and is negative unless otherwise reported in HPI.  Physical Exam Updated Vital Signs BP 127/84   Pulse 82   Temp 98.2 F (36.8 C) (Oral)   Resp 16   SpO2 96%  Physical Exam General: Normal appearing male, lying in bed.  HEENT: PERRLA, Sclera anicteric, MMM, trachea midline.  Cardiology: RRR, no murmurs/rubs/gallops. BL radial and DP pulses equal bilaterally.  Resp: Normal respiratory rate and effort. CTAB, no wheezes, rhonchi, crackles.  Abd: Soft/ Erythema with mild induration in the lower approximately 5 cm of the laparotomy  incision which still has staples. Diffuse abd Ttp as well but espeically in lower abdomen. No rebound tenderness or guarding.  GU: Deferred. MSK: No peripheral edema or signs of trauma. Extremities without deformity or TTP. No cyanosis or clubbing. Skin: warm, dry. No rashes or lesions. Back: No CVA tenderness Neuro: A&Ox4, CNs II-XII grossly intact. MAEs. Sensation grossly intact.  Psych: Normal mood and affect.   ED Results / Procedures / Treatments   Labs (all labs ordered are listed, but only abnormal results are displayed) Labs Reviewed  URINALYSIS, ROUTINE W REFLEX MICROSCOPIC - Abnormal; Notable for the following components:      Result Value   Specific Gravity, Urine 1.032 (*)    All other components within normal limits  CBC WITH DIFFERENTIAL/PLATELET - Abnormal; Notable for the following components:   Hemoglobin 12.1 (*)    HCT 38.8 (*)    Platelets 477 (*)    All other components within normal limits  COMPREHENSIVE METABOLIC PANEL WITH GFR - Abnormal; Notable for the following components:   Sodium 133 (*)    Potassium 3.4 (*)    CO2 20 (*)    Glucose, Bld 107 (*)    All other components within normal limits  LIPASE, BLOOD  CBC WITH DIFFERENTIAL/PLATELET    EKG None  Radiology CT abd pelvis: 1. Postsurgical changes from midline laparotomy, with subcutaneous fat stranding deep to the skin staples. No evidence of fluid collection or abscess. 2. No acute intra-abdominal or intrapelvic trauma. 3. Mild dilation of mid jejunum within the left mid abdomen, which may reflect segmental ileus. No evidence of high-grade obstruction. 4.  Aortic Atherosclerosis (ICD10-I70.0).  Procedures Procedures    Medications Ordered in ED Medications  iohexol  (OMNIPAQUE ) 300 MG/ML solution 100 mL (100 mLs Intravenous Contrast Given 04/22/24 2252)  oxyCODONE  (Oxy IR/ROXICODONE ) immediate release tablet 5 mg (5 mg Oral Given 04/22/24 2326)  acetaminophen  (TYLENOL ) tablet 1,000 mg  (1,000 mg Oral Given 04/22/24 2326)  oxyCODONE  (Oxy IR/ROXICODONE ) immediate release tablet 10 mg (10 mg Oral Given 04/23/24 0328)  oxyCODONE  (Oxy IR/ROXICODONE ) immediate release tablet 5 mg (5 mg Oral Given 04/23/24 1104)    ED Course/ Medical Decision Making/ A&P                          Medical Decision Making Amount and/or Complexity of Data Reviewed Labs: ordered. Decision-making details documented in ED Course. Radiology: ordered. Decision-making details documented in ED Course.  Risk OTC drugs. Prescription drug management.    This patient presents to the ED for concern of fall, abdominal pain, this involves an extensive number of treatment options, and is a complaint that carries with it a high risk of complications and morbidity.  I considered the following differential and admission for this acute, potentially life threatening condition.   MDM:    Patient reports abdominal pain and concern for possible infection at his incision.  His exam does demonstrate erythema with mild induration in the lower approximately 5 cm of the laparotomy incision which still has staples.  No purulent drainage noted.  This is likely a small area of cellulitis.  CT abdomen pelvis was obtained to determine if there was any intra-abdominal postsurgical infection or communicating infection, as well as for intra-abdominal trauma though this is lower likelihood.  CT pelvis is reassuring with possible mild segmental ileus.  Patient has not reported any constipation or nausea or vomiting.  He is hemodynamically stable and afebrile, very low concern for sepsis or serious life-threatening infection.  No leukocytosis, urine negative for infection, lipase within normal limits.  Patient is overall very well-appearing and believe he is stable for outpatient follow-up.  Will be given clindamycin  given recent instrumentation.  Patient endorses that he is unhappy at the facility and states that they are not caring for  him.  He states that he would rather be discharged back to his home where his wife can care for him there.  He states that he feels comfortable going back home.  Clinical Course as of 04/25/24 1905  Mon Apr 22, 2024  2320 CT ABDOMEN PELVIS W CONTRAST 1. Postsurgical changes from midline laparotomy, with subcutaneous fat stranding deep to the skin staples. No evidence of fluid collection or abscess. 2. No acute intra-abdominal or intrapelvic trauma. 3. Mild dilation of mid jejunum within the left mid abdomen, which may reflect segmental ileus. No evidence of high-grade obstruction. 4.  Aortic Atherosclerosis (ICD10-I70.0).   [HN]  2320 WBC: 8.1 No leukocytosis  [HN]  2320 Lipase: 28 neg [HN]  2320 Comprehensive metabolic panel with GFR(!) Unremarkable in the context of this patient's presentation  [HN]  2356 Urinalysis, Routine w reflex microscopic -Urine, Clean Catch(!) No UTI [HN]    Clinical Course User Index [HN] Merdis Stalling, MD    Labs: I Ordered, and personally interpreted labs.  The pertinent results include:  those listed above  Imaging Studies ordered: I ordered imaging studies including CT abd pelvis I independently visualized and interpreted imaging. I agree with the radiologist interpretation  Additional history obtained from chart review.   Reevaluation: After the interventions noted above, I reevaluated the patient and found that they have :improved  Social Determinants of Health: Lives at facility but will be discharged home to his wife  Disposition:  DC w/ discharge instructions/return precautions. All questions answered to patient's satisfaction.    Co morbidities that complicate the patient evaluation  Past Medical History:  Diagnosis Date   Anemia    Anxiety    Bipolar disorder (HCC)    Chronic back pain    COVID-19 06/2019   July 2020   Depression    Hemiplegia affecting left nondominant side (HCC)    Hyperlipidemia    Mood disorder  (HCC)    Personality disorder (HCC)    Restless leg syndrome    Sleep apnea    Stroke (HCC)      Medicines Meds ordered this encounter  Medications   iohexol  (OMNIPAQUE ) 300 MG/ML solution 100 mL   oxyCODONE  (Oxy IR/ROXICODONE ) immediate release tablet 5 mg    Refill:  0   acetaminophen  (TYLENOL ) tablet 1,000 mg   clindamycin  (CLEOCIN ) 150 MG capsule    Sig: Take 3 capsules (450 mg  total) by mouth 3 (three) times daily.    Dispense:  28 capsule    Refill:  0   oxyCODONE  (Oxy IR/ROXICODONE ) immediate release tablet 10 mg    Refill:  0   oxyCODONE  (Oxy IR/ROXICODONE ) immediate release tablet 5 mg    Refill:  0    I have reviewed the patients home medicines and have made adjustments as needed  Problem List / ED Course: Problem List Items Addressed This Visit   None Visit Diagnoses       Lower abdominal pain    -  Primary     Fall in home, initial encounter         Cellulitis of abdominal wall                       This note was created using dictation software, which may contain spelling or grammatical errors.    Merdis Stalling, MD 04/25/24 [phone]

## 2024-04-22 NOTE — ED Notes (Signed)
 Attempted to assist pt back into bed, pt refuses to let staff put bed railing up for fall precaution

## 2024-04-22 NOTE — ED Triage Notes (Signed)
 Patient coming EMS from [facility]. Patient called 911 himself, states facility was not calling EMS for him or treating him. Unwitnessed fall. Patient denies syncopal episode. Fell on his abdomen where his staples are. Patient endorsing pain at abdominal site. No blood thinners. A+Ox4. Ambulates with assistance at baseline.

## 2024-04-22 NOTE — Discharge Summary (Addendum)
 Discharge to Outside Facility: Case Manager  Sehaj, Mcenroe  Birth Date:  [id number] Age:  53 y.o. Sex:  male Gender:  male  Admit Date/Time:  04/16/2024 11:35 PM  Rationale For Transfer: Change in Level of Care  Planning For Transfer    Discharge Date:  04/22/2024    Discharge Disposition:  Skilled nursing facility    Method of Transport: ambulance     Receiving Facility:          [facility]       Person Accepting:          Debbie         Report called to Charge or care RN at: [phone]                 Accepting Physician:               Referring Provider Pager #:       Provider Role Specialty Pager   * Brutus, Pearla Von Raddle., MD Attending General Surgery [phone]          Records Sent Demographic information Discharge to Outside Facility form Discharge summary H&P Progress notes (may include op notes, procedure notes, therapy notes, consults) Lab reports Medication administration record  Signed By: ASHLEY RAMS  *Some images could not be shown.

## 2024-04-23 DIAGNOSIS — L03311 Cellulitis of abdominal wall: Secondary | ICD-10-CM | POA: Diagnosis not present

## 2024-04-23 MED ORDER — OXYCODONE HCL 5 MG PO TABS
10.0000 mg | ORAL_TABLET | Freq: Once | ORAL | Status: AC
  Administered 2024-04-23: 10 mg via ORAL
  Filled 2024-04-23: qty 2

## 2024-04-23 MED ORDER — CLINDAMYCIN HCL 150 MG PO CAPS: 450.0000 mg | ORAL_CAPSULE | Freq: Three times a day (TID) | ORAL | 0 refills | Status: DC

## 2024-04-23 MED ORDER — OXYCODONE HCL 5 MG PO TABS
5.0000 mg | ORAL_TABLET | Freq: Once | ORAL | Status: AC
  Administered 2024-04-23: 5 mg via ORAL
  Filled 2024-04-23: qty 1

## 2024-04-23 NOTE — ED Notes (Signed)
 After giving pt pain mediation, pt states "now I need you to tell the Doctor that I need something for the pain in my neck too". RN explained that the medication given will help, pt states "who the hell told you that, I want to see the Doctor I need more". EDP aware.

## 2024-04-23 NOTE — Discharge Instructions (Addendum)
 Thank you for coming to [facility]. You were seen for abdominal pain, fall. We did an exam, labs, and imaging, and these showed no acute findings.   There is a small area of infection likely in the incision in your abdomen.  Please take clindamycin Forner 50 mg 3 times a day for 7 days.  Please follow-up with your surgeon within 1 week.  You requested to be discharged home and your wife can care for you there. Please follow up with your primary care provider within 1 week.   Do not hesitate to return to the ED or call 911 if you experience: -Worsening symptoms -Nausea/vomiting so severe you cannot eat/drink anything -Lightheadedness, passing out -Fevers/chills -Anything else that concerns you

## 2024-04-23 NOTE — ED Notes (Signed)
 Pt needed help using the urinal. Staff helped pt and pt is resting now.

## 2024-04-27 ENCOUNTER — Emergency Department (HOSPITAL_COMMUNITY)
Admission: EM | Admit: 2024-04-27 | Discharge: 2024-04-28 | Disposition: A | Discharge status: Home / Self Care | Attending: Emergency Medicine | Admitting: Emergency Medicine

## 2024-04-27 ENCOUNTER — Encounter (HOSPITAL_COMMUNITY): Payer: Self-pay | Admitting: Emergency Medicine

## 2024-04-27 DIAGNOSIS — R030 Elevated blood-pressure reading, without diagnosis of hypertension: Secondary | ICD-10-CM

## 2024-04-27 DIAGNOSIS — L7682 Other postprocedural complications of skin and subcutaneous tissue: Secondary | ICD-10-CM

## 2024-04-27 DIAGNOSIS — G8918 Other acute postprocedural pain: Secondary | ICD-10-CM | POA: Diagnosis present

## 2024-04-27 DIAGNOSIS — Z7901 Long term (current) use of anticoagulants: Secondary | ICD-10-CM | POA: Diagnosis not present

## 2024-04-27 DIAGNOSIS — I10 Essential (primary) hypertension: Secondary | ICD-10-CM | POA: Diagnosis not present

## 2024-04-27 DIAGNOSIS — Z79899 Other long term (current) drug therapy: Secondary | ICD-10-CM | POA: Diagnosis not present

## 2024-04-27 MED ORDER — OXYCODONE-ACETAMINOPHEN 5-325 MG PO TABS
1.0000 | ORAL_TABLET | Freq: Once | ORAL | Status: DC
  Filled 2024-04-27: qty 1

## 2024-04-27 MED ORDER — HYDROMORPHONE HCL 1 MG/ML IJ SOLN
1.0000 mg | Freq: Once | INTRAMUSCULAR | Status: AC
  Administered 2024-04-27: 1 mg via INTRAMUSCULAR
  Filled 2024-04-27: qty 1

## 2024-04-27 NOTE — ED Notes (Addendum)
 Pt states that he is ready to go home and is requesting this RN to call an ambulance for him. Pt notified that he must wait for MD to speak to him and DC him before we can arrange transportation. Pt then seen calling 911 while in hallway asking them to transport him back to facility. This RN requesting MD to come speak to pt. No further orders.

## 2024-04-27 NOTE — ED Triage Notes (Signed)
 Pt arrives via EMS from [facility] for post op problem following abd sx 2 weeks ago at [facility]. Pt reports sutures started coming apart 2 days ago and is c/o abd pain. No bleeding noted at suture site. Denies N/V.

## 2024-04-27 NOTE — ED Notes (Signed)
 Pt requesting alternative pain medication as he does not feel that Percocet will help his pain. MD notified.

## 2024-04-27 NOTE — ED Provider Notes (Signed)
 [place] EMERGENCY DEPARTMENT AT [facility] Provider Note   CSN: [id number] Arrival date & time: 04/27/24  4098     History  Chief Complaint  Patient presents with   Post-op Problem    Mark Wells is a 53 y.o. male.  Pt s/p exploratory laparotomy 4/19, indicates having some pain at staple/incision site, requests pain medication. Pt limited historian - level 5 caveat. Recent ED visit for same. No fevers. No chills or sweats. No purulent drainage. Having bms, reports normal bm today.. No abd distension. No vomiting, is eating/drinking.   The history is provided by the patient, medical records and the EMS personnel. The history is limited by the condition of the patient.       Home Medications Prior to Admission medications   Medication Sig Start Date End Date Taking? Authorizing Provider  amLODipine  (NORVASC ) 5 MG tablet TAKE 1 TABLET BY MOUTH ONCE DAILY. 09/17/21   Karamalegos, Kayleen Party, DO  atorvastatin  (LIPITOR) 40 MG tablet TAKE 1 TABLET BY MOUTH ONCE DAILY AT BEDTIME FOR  CHOLESTEROL 12/10/21   Karamalegos, Kayleen Party, DO  baclofen  (LIORESAL ) 20 MG tablet Take 20 mg by mouth 4 (four) times daily. 03/21/19   [provider]  chlorthalidone  (HYGROTON ) 25 MG tablet TAKE 1 TABLET BY MOUTH EVERY DAY 04/02/21   Karamalegos, Kayleen Party, DO  clindamycin  (CLEOCIN ) 150 MG capsule Take 3 capsules (450 mg total) by mouth 3 (three) times daily. 04/23/24   Merdis Stalling, MD  clopidogrel  (PLAVIX ) 75 MG tablet TAKE 1 TABLET BY MOUTH ONCE DAILY. 05/06/21   Karamalegos, Kayleen Party, DO  losartan  (COZAAR ) 100 MG tablet TAKE 1 TABLET BY MOUTH  DAILY 12/10/21   Karamalegos, Kayleen Party, DO  methocarbamol  (ROBAXIN ) 500 MG tablet TAKE (1) TABLET EVERY SIX HOURS AS NEEDED FOR MUSCLE SPASMS.    - MAY MAKE DROWSY - 05/25/21   Raina Bunting, DO  oxyCODONE -acetaminophen  (PERCOCET) 7.5-325 MG tablet Take 1 tablet by mouth 3 (three) times daily as needed for severe pain.     [provider]  potassium chloride  (KLOR-CON ) 10 MEQ tablet TAKE 1 TABLET BY MOUTH TWICE  DAILY 12/10/21   Romeo Co, Kayleen Party, DO  pregabalin  (LYRICA ) 300 MG capsule Take 300 mg by mouth 2 (two) times daily.     [provider]  QUEtiapine  (SEROQUEL ) 50 MG tablet Take 1 tablet by mouth at bedtime.    [provider]  sertraline  (ZOLOFT ) 100 MG tablet Take 150 mg by mouth daily.    [provider]  tadalafil  (CIALIS ) 5 MG tablet Take 1 tablet (5 mg total) by mouth daily as needed for erectile dysfunction. 03/14/23   Lawerence Pressman, MD  tamsulosin  (FLOMAX ) 0.4 MG CAPS capsule TAKE 1 CAPSULE BY MOUTH ONCE  DAILY 12/19/23   Lawerence Pressman, MD  zolpidem  (AMBIEN ) 10 MG tablet Take 1 tablet by mouth at bedtime as needed for sleep. 03/07/23   [provider]      Allergies    Aspirin, Ibuprofen, and Shellfish allergy    Review of Systems   Review of Systems  Constitutional:  Negative for fever.  Respiratory:  Negative for shortness of breath.   Cardiovascular:  Negative for chest pain.  Gastrointestinal:  Negative for abdominal pain, constipation, diarrhea and vomiting.  Genitourinary:  Negative for dysuria and flank pain.  Skin:  Negative for rash.  Neurological:  Negative for headaches.    Physical Exam Updated Vital Signs BP (!) 131/90 (BP Location:  Left Arm)   Pulse 81   Temp 98.2 F (36.8 C)   Resp 17   Ht 1.676 m (5\' 6" )   Wt 90 kg   SpO2 98%   BMI 32.02 kg/m  Physical Exam Vitals and nursing note reviewed.  Constitutional:      Appearance: Normal appearance. He is well-developed.  HENT:     Head: Atraumatic.     Nose: Nose normal.     Mouth/Throat:     Mouth: Mucous membranes are moist.  Eyes:     General: No scleral icterus.    Conjunctiva/sclera: Conjunctivae normal.  Neck:     Trachea: No tracheal deviation.  Cardiovascular:     Rate and Rhythm: Normal rate and regular rhythm.     Pulses: Normal pulses.      Heart sounds: Normal heart sounds. No murmur heard.    No friction rub. No gallop.  Pulmonary:     Effort: Pulmonary effort is normal. No accessory muscle usage or respiratory distress.     Breath sounds: Normal breath sounds.  Abdominal:     General: Bowel sounds are normal. There is no distension.     Palpations: Abdomen is soft. There is no mass.     Tenderness: There is no abdominal tenderness. There is no guarding.     Comments: Healing surgical stapled incision intact. No dehiscence. No abd wall cellulitis. No purulent drainage. No crepitus. Abd soft non tender. No incarcerated hernia.   Musculoskeletal:        General: No swelling.     Cervical back: Neck supple.  Skin:    General: Skin is warm and dry.     Findings: No rash.  Neurological:     Mental Status: He is alert.     Comments: Alert, speech clear.   Psychiatric:        Mood and Affect: Mood normal.     ED Results / Procedures / Treatments   Labs (all labs ordered are listed, but only abnormal results are displayed) Results for orders placed or performed during the hospital encounter of 04/22/24  CBC with Differential/Platelet   Collection Time: 04/22/24 10:09 PM  Result Value Ref Range   WBC 8.1 4.0 - 10.5 K/uL   RBC 4.38 4.22 - 5.81 MIL/uL   Hemoglobin 12.1 (L) 13.0 - 17.0 g/dL   HCT 30.1 (L) 60.1 - 09.3 %   MCV 88.6 80.0 - 100.0 fL   MCH 27.6 26.0 - 34.0 pg   MCHC 31.2 30.0 - 36.0 g/dL   RDW 23.5 57.3 - 22.0 %   Platelets 477 (H) 150 - 400 K/uL   nRBC 0.0 0.0 - 0.2 %   Neutrophils Relative % 49 %   Neutro Abs 4.0 1.7 - 7.7 K/uL   Lymphocytes Relative 37 %   Lymphs Abs 3.0 0.7 - 4.0 K/uL   Monocytes Relative 9 %   Monocytes Absolute 0.7 0.1 - 1.0 K/uL   Eosinophils Relative 4 %   Eosinophils Absolute 0.3 0.0 - 0.5 K/uL   Basophils Relative 1 %   Basophils Absolute 0.1 0.0 - 0.1 K/uL   Immature Granulocytes 0 %   Abs Immature Granulocytes 0.02 0.00 - 0.07 K/uL  Comprehensive metabolic panel with  GFR   Collection Time: 04/22/24 10:09 PM  Result Value Ref Range   Sodium 133 (L) 135 - 145 mmol/L   Potassium 3.4 (L) 3.5 - 5.1 mmol/L   Chloride 99 98 - 111 mmol/L   CO2  20 (L) 22 - 32 mmol/L   Glucose, Bld 107 (H) 70 - 99 mg/dL   BUN 8 6 - 20 mg/dL   Creatinine, Ser 9.62 0.61 - 1.24 mg/dL   Calcium  9.5 8.9 - 10.3 mg/dL   Total Protein 8.1 6.5 - 8.1 g/dL   Albumin 3.8 3.5 - 5.0 g/dL   AST 17 15 - 41 U/L   ALT 11 0 - 44 U/L   Alkaline Phosphatase 107 38 - 126 U/L   Total Bilirubin 0.6 0.0 - 1.2 mg/dL   GFR, Estimated >95 >28 mL/min   Anion gap 14 5 - 15  Lipase, blood   Collection Time: 04/22/24 10:09 PM  Result Value Ref Range   Lipase 28 11 - 51 U/L  Urinalysis, Routine w reflex microscopic -Urine, Clean Catch   Collection Time: 04/22/24 11:06 PM  Result Value Ref Range   Color, Urine YELLOW YELLOW   APPearance CLEAR CLEAR   Specific Gravity, Urine 1.032 (H) 1.005 - 1.030   pH 5.0 5.0 - 8.0   Glucose, UA NEGATIVE NEGATIVE mg/dL   Hgb urine dipstick NEGATIVE NEGATIVE   Bilirubin Urine NEGATIVE NEGATIVE   Ketones, ur NEGATIVE NEGATIVE mg/dL   Protein, ur NEGATIVE NEGATIVE mg/dL   Nitrite NEGATIVE NEGATIVE   Leukocytes,Ua NEGATIVE NEGATIVE   CT ABDOMEN PELVIS W CONTRAST Result Date: 04/22/2024 CLINICAL DATA:  Recent laparotomy, surgical site infection, un witnessed fall EXAM: CT ABDOMEN AND PELVIS WITH CONTRAST TECHNIQUE: Multidetector CT imaging of the abdomen and pelvis was performed using the standard protocol following bolus administration of intravenous contrast. RADIATION DOSE REDUCTION: This exam was performed according to the departmental dose-optimization program which includes automated exposure control, adjustment of the mA and/or kV according to patient size and/or use of iterative reconstruction technique. CONTRAST:  OMNIPAQUE  IOHEXOL  300 MG/ML  SOLN COMPARISON:  12/20/2019 FINDINGS: Lower chest: No acute pleural or parenchymal lung disease. Hepatobiliary: No  focal liver abnormality is seen. No gallstones, gallbladder wall thickening, or biliary dilatation. Pancreas: Unremarkable. No pancreatic ductal dilatation or surrounding inflammatory changes. Spleen: Normal in size without focal abnormality. Adrenals/Urinary Tract: Simple cyst lower pole left kidney does not require specific imaging follow-up. Otherwise the kidneys enhance normally and symmetrically. No urinary tract calculi or obstructive uropathy. The adrenals and bladder are unremarkable. Stomach/Bowel: No evidence of high-grade bowel obstruction. Mildly dilated loops of small bowel within the left mid abdomen measure up to 2.8 cm, with scattered gas fluid levels, which may reflect segmental ileus. Normal appendix right lower quadrant. No bowel wall thickening or inflammatory change. Vascular/Lymphatic: Aortic atherosclerosis. No enlarged abdominal or pelvic lymph nodes. Reproductive: Prostate is unremarkable. Other: No free fluid or free intraperitoneal gas. Postsurgical changes from midline laparotomy, with subcutaneous fat stranding deep to the surgical skin staples. No evidence of fluid collection or abscess. No abdominal wall hernia. Musculoskeletal: No acute or destructive bony abnormalities. Stable postsurgical changes are seen from prior L5-S1 discectomy. Reconstructed images demonstrate no additional findings. IMPRESSION: 1. Postsurgical changes from midline laparotomy, with subcutaneous fat stranding deep to the skin staples. No evidence of fluid collection or abscess. 2. No acute intra-abdominal or intrapelvic trauma. 3. Mild dilation of mid jejunum within the left mid abdomen, which may reflect segmental ileus. No evidence of high-grade obstruction. 4.  Aortic Atherosclerosis (ICD10-I70.0). Electronically Signed   By: Bobbye Burrow M.D.   On: 04/22/2024 23:08    EKG None  Radiology No results found.  Procedures Procedures    Medications Ordered in ED  Medications   oxyCODONE -acetaminophen  (PERCOCET/ROXICET) 5-325 MG per tablet 1 tablet (1 tablet Oral Patient Refused/Not Given 04/27/24 1952)    ED Course/ Medical Decision Making/ A&P                                 Medical Decision Making Problems Addressed: Elevated blood pressure reading: acute illness or injury Essential hypertension: chronic illness or injury with exacerbation, progression, or side effects of treatment that poses a threat to life or bodily functions Pain at surgical incision: acute illness or injury    Details: Acute/chronic  Amount and/or Complexity of Data Reviewed Independent Historian: EMS    Details: hx External Data Reviewed: labs, radiology and notes. Labs:  Decision-making details documented in ED Course. Radiology: independent interpretation performed. Decision-making details documented in ED Course.  Risk Prescription drug management. Parenteral controlled substances.   Reviewed nursing notes and prior charts for additional history.   Recent ED eval for same symptoms. Labs reviewed/interpreted by me - wbc normal.   CT reviewed/interpreted  by me - no sbo or abscess. Pt was prescribed antibiotic then.   Currently no sign of incisional or abdominal wall infection. Abd is soft non tender.   Pt requests pain med. Percocet po.  Pt declines percocet, indicates does not work for him, requests pain shot.   Dilaudid  1 mg IM.   Recheck abd soft non tender.   Pt currently appears stable for d/c.   Rec close pcp f/u this week - possible suture removal then.   Return precautions provided.           Final Clinical Impression(s) / ED Diagnoses Final diagnoses:  None    Rx / DC Orders ED Discharge Orders     None         Guadalupe Lee, MD 04/27/24 2205

## 2024-04-27 NOTE — Discharge Instructions (Addendum)
 It was our pleasure to provide your ER care today - we hope that you feel better.  Keep area very clean and dry. Take acetaminophen  as need.   Follow up closely with your doctor this coming week for recheck and possible staple removal then.   Return to ER if worse, new symptoms, fevers, spreading redness, pus from wound, new or worsening or severe abdominal pain, persistent vomiting, or other concern.

## 2024-04-28 DIAGNOSIS — G8918 Other acute postprocedural pain: Secondary | ICD-10-CM | POA: Diagnosis not present

## 2024-04-28 MED ORDER — OXYCODONE-ACETAMINOPHEN 5-325 MG PO TABS
2.0000 | ORAL_TABLET | Freq: Once | ORAL | Status: AC
  Administered 2024-04-28: 2 via ORAL
  Filled 2024-04-28: qty 2

## 2024-04-29 ENCOUNTER — Emergency Department (HOSPITAL_COMMUNITY)
Admission: EM | Admit: 2024-04-29 | Discharge: 2024-04-30 | Disposition: A | Discharge status: Skilled Nursing Facility | Attending: Emergency Medicine | Admitting: Emergency Medicine

## 2024-04-29 ENCOUNTER — Other Ambulatory Visit: Payer: Self-pay

## 2024-04-29 DIAGNOSIS — Z4801 Encounter for change or removal of surgical wound dressing: Secondary | ICD-10-CM | POA: Diagnosis present

## 2024-04-29 DIAGNOSIS — Z5189 Encounter for other specified aftercare: Secondary | ICD-10-CM

## 2024-04-29 MED ORDER — OXYCODONE-ACETAMINOPHEN 5-325 MG PO TABS
1.0000 | ORAL_TABLET | Freq: Once | ORAL | Status: AC
  Administered 2024-04-29: 1 via ORAL
  Filled 2024-04-29: qty 1

## 2024-04-29 NOTE — ED Provider Notes (Signed)
 [place] EMERGENCY DEPARTMENT AT [facility] Provider Note   CSN: [id number] Arrival date & time: 04/29/24  1743     History  Chief Complaint  Patient presents with   Suture / Staple Removal    Mark Wells is a 53 y.o. male.  Patient with history of SBO status post exploratory laparotomy at [facility] on 04/13/24 presents today requesting to have his staples removed.  He states that his incision site is hurting him and he thinks this is due to the staples.  He repeatedly requests pain control.  He was here 2 days ago for this.  Reportedly, his facility noted that he has an appointment to have these staples removed next week with his surgeon.  He denies any fevers, chills, abdominal pain, nausea, vomiting, diarrhea, or urinary symptoms.  The history is provided by the patient. No language interpreter was used.  Suture / Staple Removal       Home Medications Prior to Admission medications   Medication Sig Start Date End Date Taking? Authorizing Provider  amLODipine  (NORVASC ) 5 MG tablet TAKE 1 TABLET BY MOUTH ONCE DAILY. 09/17/21   Karamalegos, Kayleen Party, DO  atorvastatin  (LIPITOR) 40 MG tablet TAKE 1 TABLET BY MOUTH ONCE DAILY AT BEDTIME FOR  CHOLESTEROL 12/10/21   Karamalegos, Kayleen Party, DO  baclofen  (LIORESAL ) 20 MG tablet Take 20 mg by mouth 4 (four) times daily. 03/21/19   [provider]  chlorthalidone  (HYGROTON ) 25 MG tablet TAKE 1 TABLET BY MOUTH EVERY DAY 04/02/21   Karamalegos, Kayleen Party, DO  clindamycin  (CLEOCIN ) 150 MG capsule Take 3 capsules (450 mg total) by mouth 3 (three) times daily. 04/23/24   Merdis Stalling, MD  clopidogrel  (PLAVIX ) 75 MG tablet TAKE 1 TABLET BY MOUTH ONCE DAILY. 05/06/21   Karamalegos, Kayleen Party, DO  losartan  (COZAAR ) 100 MG tablet TAKE 1 TABLET BY MOUTH  DAILY 12/10/21   Karamalegos, Kayleen Party, DO  methocarbamol  (ROBAXIN ) 500 MG tablet TAKE (1) TABLET EVERY SIX HOURS AS NEEDED FOR MUSCLE SPASMS.    - MAY MAKE DROWSY -  05/25/21   Raina Bunting, DO  oxyCODONE -acetaminophen  (PERCOCET) 7.5-325 MG tablet Take 1 tablet by mouth 3 (three) times daily as needed for severe pain.    [provider]  potassium chloride  (KLOR-CON ) 10 MEQ tablet TAKE 1 TABLET BY MOUTH TWICE  DAILY 12/10/21   Romeo Co, Kayleen Party, DO  pregabalin  (LYRICA ) 300 MG capsule Take 300 mg by mouth 2 (two) times daily.     [provider]  QUEtiapine  (SEROQUEL ) 50 MG tablet Take 1 tablet by mouth at bedtime.    [provider]  sertraline  (ZOLOFT ) 100 MG tablet Take 150 mg by mouth daily.    [provider]  tadalafil  (CIALIS ) 5 MG tablet Take 1 tablet (5 mg total) by mouth daily as needed for erectile dysfunction. 03/14/23   Lawerence Pressman, MD  tamsulosin  (FLOMAX ) 0.4 MG CAPS capsule TAKE 1 CAPSULE BY MOUTH ONCE  DAILY 12/19/23   Lawerence Pressman, MD  zolpidem  (AMBIEN ) 10 MG tablet Take 1 tablet by mouth at bedtime as needed for sleep. 03/07/23   [provider]      Allergies    Aspirin, Ibuprofen, and Shellfish allergy    Review of Systems   Review of Systems  Skin:  Positive for wound.  All other systems reviewed and are negative.   Physical Exam Updated Vital Signs BP 92/72 (BP Location: Right Arm)   Pulse 83  Temp 98.1 F (36.7 C) (Oral)   Resp 16   Ht 5\' 6"  (1.676 m)   Wt 90 kg   SpO2 98%   BMI 32.02 kg/m  Physical Exam Vitals and nursing note reviewed.  Constitutional:      General: He is not in acute distress.    Appearance: Normal appearance. He is normal weight. He is not ill-appearing, toxic-appearing or diaphoretic.  HENT:     Head: Normocephalic and atraumatic.  Cardiovascular:     Rate and Rhythm: Normal rate.  Pulmonary:     Effort: Pulmonary effort is normal. No respiratory distress.  Abdominal:     Comments: Midline abdominal incision site with staples in place.  The area appears to be well-healing without any signs of erythema, warmth, or drainage.   No significant tenderness to palpation.  No wound dehiscence.  Musculoskeletal:        General: Normal range of motion.     Cervical back: Normal range of motion.  Skin:    General: Skin is warm and dry.  Neurological:     General: No focal deficit present.     Mental Status: He is alert.  Psychiatric:        Mood and Affect: Mood normal.        Behavior: Behavior normal.     ED Results / Procedures / Treatments   Labs (all labs ordered are listed, but only abnormal results are displayed) Labs Reviewed - No data to display  EKG None  Radiology No results found.  Procedures Procedures    Medications Ordered in ED Medications  oxyCODONE -acetaminophen  (PERCOCET/ROXICET) 5-325 MG per tablet 1-2 tablet (has no administration in time range)    ED Course/ Medical Decision Making/ A&P                                 Medical Decision Making Risk Prescription drug management.   Patient presents today requesting to have his staples removed.  He is afebrile, nontoxic-appearing, and in no acute distress reassuring vital signs.  Physical exam reveals midline surgical site incision with staples in place without any signs of infection or significant tenderness.  Chart reviewed, patient had exploratory laparotomy on 4/19 for SBO at [facility].  He presented here on 4/28 and had a normal CT scan.  He was also here on 5/2 for similar complaints and was given pain control and discharged.  Reportedly has an appointment next week to have his staples removed.  Given this, recommend that he have these staples removed at his follow-up appointment rather than in the emergency department this evening.  No indication for further evaluation with labs or imaging.  After Percocet, patient's pain is resolved and he feels ready to go home. Evaluation and diagnostic testing in the emergency department does not suggest an emergent condition requiring admission or immediate intervention beyond what has been  performed at this time.  Plan for discharge with close PCP follow-up.  Patient is understanding and amenable with plan, educated on red flag symptoms that would prompt immediate return.  Patient discharged in stable condition.  Final Clinical Impression(s) / ED Diagnoses Final diagnoses:  Visit for wound check    Rx / DC Orders ED Discharge Orders     None     An After Visit Summary was printed and given to the patient.     Arshdeep Bolger A, PA-C 04/29/24 1949    Kermit Ped,  Verdie Gladden, MD 04/30/24 1610

## 2024-04-29 NOTE — ED Triage Notes (Signed)
 Pt from [facility] wants staples removed from abdomen incision. He has appt 05/08/24 to have them removed.

## 2024-04-29 NOTE — Discharge Instructions (Signed)
 As we discussed, it appears your wound is healing well.  Given that you have an appointment to see your surgeon next week, I recommend that you have the staples removed at this appointment rather than in the emergency department this evening.  Please take your home pain medication for symptomatic management.  Please call your PCP to schedule close follow-up appointment as well as your surgeon.  Return if development of any new or worsening symptoms.

## 2024-04-30 MED ORDER — OXYCODONE-ACETAMINOPHEN 5-325 MG PO TABS: 1.5000 | ORAL_TABLET | Freq: Four times a day (QID) | ORAL | Status: DC | PRN

## 2024-08-16 ENCOUNTER — Encounter (HOSPITAL_COMMUNITY): Payer: Self-pay

## 2024-08-16 ENCOUNTER — Other Ambulatory Visit: Payer: Self-pay

## 2024-08-16 ENCOUNTER — Inpatient Hospital Stay (HOSPITAL_COMMUNITY)
Admission: EM | Admit: 2024-08-16 | Discharge: 2024-08-20 | DRG: 641 | Disposition: A | Discharge status: Other Acute Inpatient Hospital | Source: Other Acute Inpatient Hospital | Attending: Internal Medicine | Admitting: Internal Medicine

## 2024-08-16 ENCOUNTER — Emergency Department (HOSPITAL_COMMUNITY)

## 2024-08-16 DIAGNOSIS — Y92239 Unspecified place in hospital as the place of occurrence of the external cause: Secondary | ICD-10-CM | POA: Diagnosis present

## 2024-08-16 DIAGNOSIS — G8929 Other chronic pain: Secondary | ICD-10-CM | POA: Diagnosis present

## 2024-08-16 DIAGNOSIS — Z91013 Allergy to seafood: Secondary | ICD-10-CM

## 2024-08-16 DIAGNOSIS — I10 Essential (primary) hypertension: Secondary | ICD-10-CM | POA: Diagnosis not present

## 2024-08-16 DIAGNOSIS — Z91041 Radiographic dye allergy status: Secondary | ICD-10-CM

## 2024-08-16 DIAGNOSIS — Z79899 Other long term (current) drug therapy: Secondary | ICD-10-CM

## 2024-08-16 DIAGNOSIS — F3342 Major depressive disorder, recurrent, in full remission: Secondary | ICD-10-CM | POA: Diagnosis present

## 2024-08-16 DIAGNOSIS — W010XXA Fall on same level from slipping, tripping and stumbling without subsequent striking against object, initial encounter: Secondary | ICD-10-CM | POA: Diagnosis present

## 2024-08-16 DIAGNOSIS — E871 Hypo-osmolality and hyponatremia: Secondary | ICD-10-CM | POA: Diagnosis not present

## 2024-08-16 DIAGNOSIS — D638 Anemia in other chronic diseases classified elsewhere: Secondary | ICD-10-CM | POA: Diagnosis present

## 2024-08-16 DIAGNOSIS — E785 Hyperlipidemia, unspecified: Secondary | ICD-10-CM | POA: Diagnosis present

## 2024-08-16 DIAGNOSIS — E876 Hypokalemia: Secondary | ICD-10-CM | POA: Diagnosis not present

## 2024-08-16 DIAGNOSIS — E860 Dehydration: Secondary | ICD-10-CM | POA: Diagnosis present

## 2024-08-16 DIAGNOSIS — Z8616 Personal history of COVID-19: Secondary | ICD-10-CM

## 2024-08-16 DIAGNOSIS — Z888 Allergy status to other drugs, medicaments and biological substances status: Secondary | ICD-10-CM

## 2024-08-16 DIAGNOSIS — S0101XA Laceration without foreign body of scalp, initial encounter: Secondary | ICD-10-CM | POA: Diagnosis present

## 2024-08-16 DIAGNOSIS — I69354 Hemiplegia and hemiparesis following cerebral infarction affecting left non-dominant side: Secondary | ICD-10-CM

## 2024-08-16 DIAGNOSIS — Z87891 Personal history of nicotine dependence: Secondary | ICD-10-CM

## 2024-08-16 DIAGNOSIS — F319 Bipolar disorder, unspecified: Secondary | ICD-10-CM | POA: Diagnosis present

## 2024-08-16 DIAGNOSIS — W19XXXA Unspecified fall, initial encounter: Secondary | ICD-10-CM

## 2024-08-16 DIAGNOSIS — R531 Weakness: Principal | ICD-10-CM

## 2024-08-16 DIAGNOSIS — Z8673 Personal history of transient ischemic attack (TIA), and cerebral infarction without residual deficits: Secondary | ICD-10-CM

## 2024-08-16 DIAGNOSIS — G4733 Obstructive sleep apnea (adult) (pediatric): Secondary | ICD-10-CM | POA: Diagnosis present

## 2024-08-16 DIAGNOSIS — N400 Enlarged prostate without lower urinary tract symptoms: Secondary | ICD-10-CM | POA: Diagnosis present

## 2024-08-16 DIAGNOSIS — Z886 Allergy status to analgesic agent status: Secondary | ICD-10-CM

## 2024-08-16 DIAGNOSIS — K529 Noninfective gastroenteritis and colitis, unspecified: Secondary | ICD-10-CM | POA: Diagnosis present

## 2024-08-16 DIAGNOSIS — Z7902 Long term (current) use of antithrombotics/antiplatelets: Secondary | ICD-10-CM

## 2024-08-16 DIAGNOSIS — G2581 Restless legs syndrome: Secondary | ICD-10-CM | POA: Diagnosis present

## 2024-08-16 LAB — COMPREHENSIVE METABOLIC PANEL WITH GFR
ALT: 11 U/L (ref 0–44)
AST: 13 U/L — ABNORMAL LOW (ref 15–41)
Albumin: 3.3 g/dL — ABNORMAL LOW (ref 3.5–5.0)
Alkaline Phosphatase: 77 U/L (ref 38–126)
Anion gap: 13 (ref 5–15)
BUN: 15 mg/dL (ref 6–20)
CO2: 26 mmol/L (ref 22–32)
Calcium: 8.2 mg/dL — ABNORMAL LOW (ref 8.9–10.3)
Chloride: 95 mmol/L — ABNORMAL LOW (ref 98–111)
Creatinine, Ser: 0.71 mg/dL (ref 0.61–1.24)
GFR, Estimated: >60 mL/min (ref >60)
Glucose, Bld: 109 mg/dL — ABNORMAL HIGH (ref 70–99)
Potassium: 2.4 mmol/L — CL (ref 3.5–5.1)
Sodium: 134 mmol/L — ABNORMAL LOW (ref 135–145)
Total Bilirubin: 0.6 mg/dL (ref 0.0–1.2)
Total Protein: 6.7 g/dL (ref 6.5–8.1)

## 2024-08-16 LAB — CBC WITH DIFFERENTIAL/PLATELET
Abs Immature Granulocytes: 0.03 K/uL (ref 0.00–0.07)
Basophils Absolute: 0 K/uL (ref 0.0–0.1)
Basophils Relative: 0 %
Eosinophils Absolute: 0.3 K/uL (ref 0.0–0.5)
Eosinophils Relative: 4 %
HCT: 36.7 % — ABNORMAL LOW (ref 39.0–52.0)
Hemoglobin: 11.5 g/dL — ABNORMAL LOW (ref 13.0–17.0)
Immature Granulocytes: 0 %
Lymphocytes Relative: 35 %
Lymphs Abs: 2.9 K/uL (ref 0.7–4.0)
MCH: 25.3 pg — ABNORMAL LOW (ref 26.0–34.0)
MCHC: 31.3 g/dL (ref 30.0–36.0)
MCV: 80.7 fL (ref 80.0–100.0)
Monocytes Absolute: 1.1 K/uL — ABNORMAL HIGH (ref 0.1–1.0)
Monocytes Relative: 14 %
Neutro Abs: 3.9 K/uL (ref 1.7–7.7)
Neutrophils Relative %: 47 %
Platelets: 272 K/uL (ref 150–400)
RBC: 4.55 MIL/uL (ref 4.22–5.81)
RDW: 19.3 % — ABNORMAL HIGH (ref 11.5–15.5)
WBC: 8.3 K/uL (ref 4.0–10.5)
nRBC: 0 % (ref 0.0–0.2)

## 2024-08-16 MED ORDER — POTASSIUM CHLORIDE 10 MEQ/100ML IV SOLN: 10.0000 meq | INTRAVENOUS | Status: DC

## 2024-08-16 MED ORDER — ENOXAPARIN SODIUM 40 MG/0.4ML IJ SOSY
40.0000 mg | PREFILLED_SYRINGE | INTRAMUSCULAR | Status: DC
  Administered 2024-08-17 – 2024-08-20 (×4): 40 mg via SUBCUTANEOUS
  Filled 2024-08-16 (×4): qty 0.4

## 2024-08-16 MED ORDER — ATORVASTATIN CALCIUM 40 MG PO TABS
40.0000 mg | ORAL_TABLET | Freq: Every day | ORAL | Status: DC
  Administered 2024-08-17 – 2024-08-20 (×4): 40 mg via ORAL
  Filled 2024-08-16 (×4): qty 1

## 2024-08-16 MED ORDER — PREGABALIN 100 MG PO CAPS
300.0000 mg | ORAL_CAPSULE | Freq: Two times a day (BID) | ORAL | Status: DC
  Administered 2024-08-17 – 2024-08-20 (×7): 300 mg via ORAL
  Filled 2024-08-16 (×7): qty 3

## 2024-08-16 MED ORDER — AMLODIPINE BESYLATE 5 MG PO TABS
5.0000 mg | ORAL_TABLET | Freq: Every day | ORAL | Status: DC
  Administered 2024-08-17 – 2024-08-20 (×4): 5 mg via ORAL
  Filled 2024-08-16 (×4): qty 1

## 2024-08-16 MED ORDER — LOSARTAN POTASSIUM 50 MG PO TABS
100.0000 mg | ORAL_TABLET | Freq: Every day | ORAL | Status: DC
  Administered 2024-08-17 – 2024-08-20 (×4): 100 mg via ORAL
  Filled 2024-08-16 (×4): qty 2

## 2024-08-16 MED ORDER — ZOLPIDEM TARTRATE 5 MG PO TABS
10.0000 mg | ORAL_TABLET | Freq: Every evening | ORAL | Status: DC | PRN
  Administered 2024-08-17 – 2024-08-19 (×3): 10 mg via ORAL
  Filled 2024-08-16 (×3): qty 2

## 2024-08-16 MED ORDER — TAMSULOSIN HCL 0.4 MG PO CAPS
0.4000 mg | ORAL_CAPSULE | Freq: Every day | ORAL | Status: DC
  Administered 2024-08-17 – 2024-08-20 (×4): 0.4 mg via ORAL
  Filled 2024-08-16 (×4): qty 1

## 2024-08-16 MED ORDER — ACETAMINOPHEN 325 MG PO TABS
650.0000 mg | ORAL_TABLET | Freq: Four times a day (QID) | ORAL | Status: DC | PRN
  Administered 2024-08-17 – 2024-08-18 (×4): 650 mg via ORAL
  Filled 2024-08-16 (×4): qty 2

## 2024-08-16 MED ORDER — QUETIAPINE FUMARATE 25 MG PO TABS
50.0000 mg | ORAL_TABLET | Freq: Every day | ORAL | Status: DC
  Administered 2024-08-17 – 2024-08-19 (×3): 50 mg via ORAL
  Filled 2024-08-16 (×3): qty 2

## 2024-08-16 MED ORDER — POTASSIUM CHLORIDE CRYS ER 20 MEQ PO TBCR
40.0000 meq | EXTENDED_RELEASE_TABLET | Freq: Once | ORAL | Status: DC
  Filled 2024-08-16: qty 2

## 2024-08-16 MED ORDER — SERTRALINE HCL 50 MG PO TABS
150.0000 mg | ORAL_TABLET | Freq: Every day | ORAL | Status: DC
  Administered 2024-08-17 – 2024-08-20 (×4): 150 mg via ORAL
  Filled 2024-08-16 (×4): qty 1

## 2024-08-16 MED ORDER — POTASSIUM CHLORIDE 10 MEQ/100ML IV SOLN
10.0000 meq | INTRAVENOUS | Status: DC
  Administered 2024-08-17 (×2): 10 meq via INTRAVENOUS
  Filled 2024-08-16 (×2): qty 100

## 2024-08-16 MED ORDER — CLOPIDOGREL BISULFATE 75 MG PO TABS
75.0000 mg | ORAL_TABLET | Freq: Every day | ORAL | Status: DC
  Administered 2024-08-17 – 2024-08-20 (×4): 75 mg via ORAL
  Filled 2024-08-16 (×4): qty 1

## 2024-08-16 MED ORDER — ACETAMINOPHEN 650 MG RE SUPP: 650.0000 mg | Freq: Four times a day (QID) | RECTAL | Status: DC | PRN

## 2024-08-16 MED ORDER — MAGNESIUM SULFATE 2 GM/50ML IV SOLN
2.0000 g | Freq: Once | INTRAVENOUS | Status: AC
  Administered 2024-08-17: 2 g via INTRAVENOUS
  Filled 2024-08-16: qty 50

## 2024-08-16 NOTE — ED Triage Notes (Signed)
 Pt bib ems from [facility], complain of fall. Pt reportedly slipped d/t spill on the floor. Pt is reportedly on Plavix . Pt a&ox4 upon arrival. Complains of left sided head pain. Small hematoma noted, bleeding controlled. Denies LOC

## 2024-08-16 NOTE — ED Provider Notes (Addendum)
 [place] [facility] AT [facility] Provider Note   CSN: [id number] Arrival date & time: 08/16/24  2141     Patient presents with: Mark Wells   Mark Wells is a 53 y.o. male.   53 year old male brought in by EMS from facility for a fall on Plavix . Prior CVA with report of right side deficits. Per EMS, patient took his night time meds prior to the fall. Small lac to left posterior scalp. No other injuries. Level 5 caveat for poor historian.        Prior to Admission medications   Medication Sig Start Date End Date Taking? Authorizing Provider  amLODipine  (NORVASC ) 5 MG tablet TAKE 1 TABLET BY MOUTH ONCE DAILY. 09/17/21   Karamalegos, Marsa PARAS, DO  atorvastatin  (LIPITOR) 40 MG tablet TAKE 1 TABLET BY MOUTH ONCE DAILY AT BEDTIME FOR  CHOLESTEROL 12/10/21   Karamalegos, Marsa PARAS, DO  baclofen  (LIORESAL ) 20 MG tablet Take 20 mg by mouth 4 (four) times daily. 03/21/19   [provider]  chlorthalidone  (HYGROTON ) 25 MG tablet TAKE 1 TABLET BY MOUTH EVERY DAY 04/02/21   Karamalegos, Marsa PARAS, DO  clindamycin  (CLEOCIN ) 150 MG capsule Take 3 capsules (450 mg total) by mouth 3 (three) times daily. 04/23/24   Franklyn Sid SAILOR, MD  clopidogrel  (PLAVIX ) 75 MG tablet TAKE 1 TABLET BY MOUTH ONCE DAILY. 05/06/21   Karamalegos, Marsa PARAS, DO  losartan  (COZAAR ) 100 MG tablet TAKE 1 TABLET BY MOUTH  DAILY 12/10/21   Karamalegos, Marsa PARAS, DO  methocarbamol  (ROBAXIN ) 500 MG tablet TAKE (1) TABLET EVERY SIX HOURS AS NEEDED FOR MUSCLE SPASMS.    - MAY MAKE DROWSY - 05/25/21   Edman Marsa PARAS, DO  oxyCODONE -acetaminophen  (PERCOCET) 7.5-325 MG tablet Take 1 tablet by mouth 3 (three) times daily as needed for severe pain.    [provider]  potassium chloride  (KLOR-CON ) 10 MEQ tablet TAKE 1 TABLET BY MOUTH TWICE  DAILY 12/10/21   Edman, Marsa PARAS, DO  pregabalin  (LYRICA ) 300 MG capsule Take 300 mg by mouth 2 (two) times daily.     [provider]  QUEtiapine  (SEROQUEL ) 50 MG tablet Take 1 tablet by mouth at bedtime.    [provider]  sertraline  (ZOLOFT ) 100 MG tablet Take 150 mg by mouth daily.    [provider]  tadalafil  (CIALIS ) 5 MG tablet Take 1 tablet (5 mg total) by mouth daily as needed for erectile dysfunction. 03/14/23   Francisca Redell BROCKS, MD  tamsulosin  (FLOMAX ) 0.4 MG CAPS capsule TAKE 1 CAPSULE BY MOUTH ONCE  DAILY 12/19/23   Francisca Redell BROCKS, MD  zolpidem  (AMBIEN ) 10 MG tablet Take 1 tablet by mouth at bedtime as needed for sleep. 03/07/23   [provider]    Allergies: Aspirin, Ibuprofen, and Shellfish allergy    Review of Systems Level 5 caveat for poor historian  Updated Vital Signs BP 100/77   Pulse (!) 52   Temp 97.9 F (36.6 C)   Resp (!) 22   Ht 5' 6 (1.676 m)   Wt 81.6 kg   SpO2 100%   BMI 29.05 kg/m   Physical Exam Vitals and nursing note reviewed.  Constitutional:      General: He is not in acute distress.    Appearance: He is well-developed. He is not diaphoretic.      Comments: Approx 4mm small laceration to left posterior scalp, no active bleeding, does not require closure.   HENT:  Head: Normocephalic.     Nose: Nose normal.     Mouth/Throat:     Mouth: Mucous membranes are dry.  Eyes:     Conjunctiva/sclera: Conjunctivae normal.  Cardiovascular:     Pulses: Normal pulses.  Pulmonary:     Effort: Pulmonary effort is normal.  Abdominal:     Palpations: Abdomen is soft.     Tenderness: There is no abdominal tenderness.  Musculoskeletal:     Cervical back: Neck supple.     Right lower leg: No edema.     Left lower leg: No edema.  Skin:    Findings: No erythema or rash.  Neurological:     Mental Status: He is alert.     Sensory: No sensory deficit.     Motor: Weakness present.     Comments: Orient to year/president. When asked date, states January and then proceeds to list the months in order. Right and leg weakness likely baseline   Psychiatric:        Behavior: Behavior normal.     (all labs ordered are listed, but only abnormal results are displayed) Labs Reviewed  CBC WITH DIFFERENTIAL/PLATELET - Abnormal; Notable for the following components:      Result Value   Hemoglobin 11.5 (*)    HCT 36.7 (*)    MCH 25.3 (*)    RDW 19.3 (*)    Monocytes Absolute 1.1 (*)    All other components within normal limits  COMPREHENSIVE METABOLIC PANEL WITH GFR - Abnormal; Notable for the following components:   Sodium 134 (*)    Potassium 2.4 (*)    Chloride 95 (*)    Glucose, Bld 109 (*)    Calcium  8.2 (*)    Albumin 3.3 (*)    AST 13 (*)    All other components within normal limits  URINALYSIS, ROUTINE W REFLEX MICROSCOPIC  HIV ANTIBODY (ROUTINE TESTING W REFLEX)  CBC  CREATININE, SERUM  MAGNESIUM   BASIC METABOLIC PANEL WITH GFR  CBC    EKG: None  Radiology: CT Cervical Spine Wo Contrast Result Date: 08/16/2024 CLINICAL DATA:  Status post fall. EXAM: CT CERVICAL SPINE WITHOUT CONTRAST TECHNIQUE: Multidetector CT imaging of the cervical spine was performed without intravenous contrast. Multiplanar CT image reconstructions were also generated. RADIATION DOSE REDUCTION: This exam was performed according to the departmental dose-optimization program which includes automated exposure control, adjustment of the mA and/or kV according to patient size and/or use of iterative reconstruction technique. COMPARISON:  August 18, 2013 FINDINGS: Alignment: There is reversal of the normal cervical spine lordosis. Skull base and vertebrae: No acute fracture. No primary bone lesion or focal pathologic process. Soft tissues and spinal canal: No prevertebral fluid or swelling. No visible canal hematoma. Disc levels: Marked severity endplate sclerosis, anterior osteophyte formation and posterior bony spurring are seen at the levels of C3-C4, C5-C6, C6-C7 and C7-T1. Mild to moderate severity anterior osteophyte formation is also present at  the level of C4-C5. Marked severity intervertebral disc space narrowing is present at the levels of C3-C4, C5-C6, C6-C7 and C7-T1. Bilateral moderate severity multilevel facet joint hypertrophy is noted. Upper chest: Negative. Other: None. IMPRESSION: 1. Marked severity multilevel degenerative changes, as described above. 2. No acute cervical spine fracture. Electronically Signed   By: Suzen Dials M.D.   On: 08/16/2024 22:48   CT Head Wo Contrast Result Date: 08/16/2024 CLINICAL DATA:  Status post fall. EXAM: CT HEAD WITHOUT CONTRAST TECHNIQUE: Contiguous axial images were obtained from the base of  the skull through the vertex without intravenous contrast. RADIATION DOSE REDUCTION: This exam was performed according to the departmental dose-optimization program which includes automated exposure control, adjustment of the mA and/or kV according to patient size and/or use of iterative reconstruction technique. COMPARISON:  December 31, 2018 FINDINGS: Brain: No evidence of acute infarction, hemorrhage, hydrocephalus, extra-axial collection or mass lesion/mass effect. A chronic right basal ganglia lacunar infarct is noted. Vascular: No hyperdense vessel or unexpected calcification. Skull: Normal. Negative for fracture or focal lesion. Sinuses/Orbits: No acute finding. Other: There is mild left frontoparietal scalp soft tissue swelling. IMPRESSION: 1. No acute intracranial abnormality. 2. Chronic right basal ganglia lacunar infarct. 3. Mild left frontoparietal scalp soft tissue swelling. Electronically Signed   By: Suzen Dials M.D.   On: 08/16/2024 22:45     .Critical Care  Performed by: Beverley Leita LABOR, PA-C Authorized by: Beverley Leita LABOR, PA-C   Critical care provider statement:    Critical care time (minutes):  30   Critical care was time spent personally by me on the following activities:  Development of treatment plan with patient or surrogate, discussions with consultants, evaluation of  patient's response to treatment, examination of patient, ordering and review of laboratory studies, ordering and review of radiographic studies, ordering and performing treatments and interventions, pulse oximetry, re-evaluation of patient's condition and review of old charts    Medications Ordered in the ED  magnesium  sulfate IVPB 2 g 50 mL (has no administration in time range)  potassium chloride  SA (KLOR-CON  M) CR tablet 40 mEq (has no administration in time range)  potassium chloride  10 mEq in 100 mL IVPB (has no administration in time range)  amLODipine  (NORVASC ) tablet 5 mg (has no administration in time range)  atorvastatin  (LIPITOR) tablet 40 mg (has no administration in time range)  losartan  (COZAAR ) tablet 100 mg (has no administration in time range)  QUEtiapine  (SEROQUEL ) tablet 50 mg (has no administration in time range)  sertraline  (ZOLOFT ) tablet 150 mg (has no administration in time range)  zolpidem  (AMBIEN ) tablet 10 mg (has no administration in time range)  tamsulosin  (FLOMAX ) capsule 0.4 mg (has no administration in time range)  clopidogrel  (PLAVIX ) tablet 75 mg (has no administration in time range)  pregabalin  (LYRICA ) capsule 300 mg (has no administration in time range)  enoxaparin  (LOVENOX ) injection 40 mg (has no administration in time range)  acetaminophen  (TYLENOL ) tablet 650 mg (has no administration in time range)    Or  acetaminophen  (TYLENOL ) suppository 650 mg (has no administration in time range)                                    Medical Decision Making Amount and/or Complexity of Data Reviewed Labs: ordered. Radiology: ordered.  Risk Prescription drug management. Decision regarding hospitalization.   This patient presents to the ED for concern of fall, this involves an extensive number of treatment options, and is a complaint that carries with it a high risk of complications and morbidity.  The differential diagnosis includes but not limited to  intracranial injury, cervical spine injury, electrolyte/metabolic, urinary tract infection   Co morbidities / Chronic conditions that complicate the patient evaluation  Hypertension, CVA, OSA   Additional history obtained:  Additional history obtained from EMR External records from outside source obtained and reviewed including prior labs and records on file.   Lab Tests:  I Ordered, and personally interpreted labs.  The  pertinent results include: CBC without significant findings, it hemoglobin 11.5, not significantly changed from prior.  CMP with potassium of 2.4.   Imaging Studies ordered:  I ordered imaging studies including CT head, CT C-spine I independently visualized and interpreted imaging which showed no acute injury I agree with the radiologist interpretation   Cardiac Monitoring: / EKG:  The patient was maintained on a cardiac monitor.  I personally viewed and interpreted the cardiac monitored which showed an underlying rhythm of: does not show significant t-wave abnormality with concern for patient's hypokalemia    Problem List / ED Course / Critical interventions / Medication management  53 year old male brought in by EMS from facility after a fall.  He does have a small laceration to the left occipital scalp which does not require closure today.  His CT head and C-spine are negative for acute traumatic findings.  Patient was found to have hypokalemia with potassium of 2.5 which was replaced orally as well as IV along with IV potassium.  Patient is admitted to the hospital for additional monitoring and management. I ordered medication including Mg, KCL, Kdur   Reevaluation of the patient after these medicines showed that the patient remained stable I have reviewed the patients home medicines and have made adjustments as needed   Consultations Obtained:  I requested consultation with the hospitalist, Dr. Franky,  and discussed lab and imaging findings as well  as pertinent plan - they recommend: admit, change prior KCL order for 3 rounds to 6.    Social Determinants of Health:  Lives at facility   Test / Admission - Considered:  admit      Final diagnoses:  Weakness  Fall, initial encounter  Laceration of scalp, initial encounter  Hypokalemia    ED Discharge Orders     None          Beverley Leita LABOR, PA-C 08/16/24 2359    Beverley Leita LABOR, PA-C 08/17/24 0000    Griselda Norris, MD 08/19/24 [phone]

## 2024-08-16 NOTE — ED Notes (Signed)
 Pt has reportedly taken his night meds

## 2024-08-16 NOTE — ED Notes (Signed)
 IV attempted twice. Lab notified to collect blood work

## 2024-08-16 NOTE — H&P (Addendum)
 History and Physical    Mark Wells FMW:[id number] DOB: May 31, 1971 DOA: 08/16/2024  Patient coming from: Home.  Chief Complaint: Fall.  HPI: Mark Wells is a 53 y.o. male with history of CVA with left-sided hemiplegia, hypertension, anemia, mood disorder, chronic pain recently admitted at [facility] in April for bowel obstruction was brought to the ER after patient had a fall at home.  Patient states he was walking and slipped on some liquid on the floor and fell onto the floor hit his head had a small scalp abrasion with some bleeding.  Denies losing consciousness.  Denies any chest pain or shortness of breath or palpitations.  ED Course: In the ER CT head and C-spine is unremarkable.  Labs show severe hypokalemia and mild hyponatremia.  Potassium level was 2.4 sodium 134.  Patient admitted for further observation and replacement.  Review of Systems: As per HPI, rest all negative.   Past Medical History:  Diagnosis Date   Anemia    Anxiety    Bipolar disorder (HCC)    Chronic back pain    COVID-19 06/2019   July 2020   Depression    Hemiplegia affecting left nondominant side (HCC)    Hyperlipidemia    Mood disorder (HCC)    Personality disorder (HCC)    Restless leg syndrome    Sleep apnea    Stroke [facility] [place])     Past Surgical History:  Procedure Laterality Date   BACK SURGERY       reports that he has been smoking cigarettes. He has been exposed to tobacco smoke. He uses smokeless tobacco. He reports current drug use. Drug: Oxycodone . He reports that he does not drink alcohol.  Allergies  Allergen Reactions   Aspirin Other (See Comments)    Has Gastric Ulcer Other reaction(s): jittery   Ibuprofen Other (See Comments)    Ulcers- bleeding Other reaction(s): rectal bleeding   Shellfish Allergy Swelling    Other reaction(s): SOB    History reviewed. No pertinent family history.  Prior to Admission medications   Medication Sig Start Date End Date Taking?  Authorizing Provider  amLODipine  (NORVASC ) 5 MG tablet TAKE 1 TABLET BY MOUTH ONCE DAILY. 09/17/21   Karamalegos, Marsa PARAS, DO  atorvastatin  (LIPITOR) 40 MG tablet TAKE 1 TABLET BY MOUTH ONCE DAILY AT BEDTIME FOR  CHOLESTEROL 12/10/21   Karamalegos, Marsa PARAS, DO  baclofen  (LIORESAL ) 20 MG tablet Take 20 mg by mouth 4 (four) times daily. 03/21/19   [provider]  chlorthalidone  (HYGROTON ) 25 MG tablet TAKE 1 TABLET BY MOUTH EVERY DAY 04/02/21   Karamalegos, Marsa PARAS, DO  clindamycin  (CLEOCIN ) 150 MG capsule Take 3 capsules (450 mg total) by mouth 3 (three) times daily. 04/23/24   Franklyn Sid SAILOR, MD  clopidogrel  (PLAVIX ) 75 MG tablet TAKE 1 TABLET BY MOUTH ONCE DAILY. 05/06/21   Karamalegos, Marsa PARAS, DO  losartan  (COZAAR ) 100 MG tablet TAKE 1 TABLET BY MOUTH  DAILY 12/10/21   Karamalegos, Marsa PARAS, DO  methocarbamol  (ROBAXIN ) 500 MG tablet TAKE (1) TABLET EVERY SIX HOURS AS NEEDED FOR MUSCLE SPASMS.    - MAY MAKE DROWSY - 05/25/21   Edman Marsa PARAS, DO  oxyCODONE -acetaminophen  (PERCOCET) 7.5-325 MG tablet Take 1 tablet by mouth 3 (three) times daily as needed for severe pain.    [provider]  potassium chloride  (KLOR-CON ) 10 MEQ tablet TAKE 1 TABLET BY MOUTH TWICE  DAILY 12/10/21   Edman, Marsa PARAS, DO  pregabalin  (LYRICA ) 300 MG capsule Take  300 mg by mouth 2 (two) times daily.     [provider]  QUEtiapine  (SEROQUEL ) 50 MG tablet Take 1 tablet by mouth at bedtime.    [provider]  sertraline  (ZOLOFT ) 100 MG tablet Take 150 mg by mouth daily.    [provider]  tadalafil  (CIALIS ) 5 MG tablet Take 1 tablet (5 mg total) by mouth daily as needed for erectile dysfunction. 03/14/23   Francisca Redell BROCKS, MD  tamsulosin  (FLOMAX ) 0.4 MG CAPS capsule TAKE 1 CAPSULE BY MOUTH ONCE  DAILY 12/19/23   Francisca Redell BROCKS, MD  zolpidem  (AMBIEN ) 10 MG tablet Take 1 tablet by mouth at bedtime as needed for sleep. 03/07/23   [provider]    Physical Exam: Constitutional: Moderately built and nourished. Vitals:   08/16/24 2149 08/16/24 2150 08/16/24 2200  BP:  92/63 100/77  Pulse:  70 (!) 52  Resp:  15 (!) 22  Temp:  97.9 F (36.6 C)   SpO2:  98% 100%  Weight: 81.6 kg    Height: 5' 6 (1.676 m)     Eyes: Anicteric no pallor. ENMT: No discharge from the ears eyes nose or mouth. Neck: No mass felt.  No neck rigidity. Respiratory: No rhonchi or crepitations. Cardiovascular: S1-S2 heard. Abdomen: Soft nontender bowel sound present. Musculoskeletal: No edema. Skin: No rash. Neurologic: Alert awake oriented to time place and person.  Left-sided hemiplegia. Psychiatric: Appears normal.  Normal affect.   Labs on Admission: I have personally reviewed following labs and imaging studies  CBC: Recent Labs  Lab 08/16/24 2239  WBC 8.3  NEUTROABS 3.9  HGB 11.5*  HCT 36.7*  MCV 80.7  PLT 272   Basic Metabolic Panel: Recent Labs  Lab 08/16/24 2239  NA 134*  K 2.4*  CL 95*  CO2 26  GLUCOSE 109*  BUN 15  CREATININE 0.71  CALCIUM  8.2*   GFR: Estimated Creatinine Clearance: 105.9 mL/min (by C-G formula based on SCr of 0.71 mg/dL). Liver Function Tests: Recent Labs  Lab 08/16/24 2239  AST 13*  ALT 11  ALKPHOS 77  BILITOT 0.6  PROT 6.7  ALBUMIN 3.3*   No results for input(s): LIPASE, AMYLASE in the last 168 hours. No results for input(s): AMMONIA in the last 168 hours. Coagulation Profile: No results for input(s): INR, PROTIME in the last 168 hours. Cardiac Enzymes: No results for input(s): CKTOTAL, CKMB, CKMBINDEX, TROPONINI in the last 168 hours. BNP (last 3 results) No results for input(s): PROBNP in the last 8760 hours. HbA1C: No results for input(s): HGBA1C in the last 72 hours. CBG: No results for input(s): GLUCAP in the last 168 hours. Lipid Profile: No results for input(s): CHOL, HDL, LDLCALC, TRIG, CHOLHDL, LDLDIRECT in the last 72  hours. Thyroid Function Tests: No results for input(s): TSH, T4TOTAL, FREET4, T3FREE, THYROIDAB in the last 72 hours. Anemia Panel: No results for input(s): VITAMINB12, FOLATE, FERRITIN, TIBC, IRON, RETICCTPCT in the last 72 hours. Urine analysis:    Component Value Date/Time   COLORURINE YELLOW 04/22/2024 2306   APPEARANCEUR CLEAR 04/22/2024 2306   APPEARANCEUR clear 06/16/2019 0000   LABSPEC 1.032 (H) 04/22/2024 2306   LABSPEC 1.010 09/25/2013 0111   PHURINE 5.0 04/22/2024 2306   GLUCOSEU NEGATIVE 04/22/2024 2306   GLUCOSEU Negative 09/25/2013 0111   HGBUR NEGATIVE 04/22/2024 2306   BILIRUBINUR NEGATIVE 04/22/2024 2306   BILIRUBINUR Negative 09/25/2013 0111   KETONESUR NEGATIVE 04/22/2024 2306   PROTEINUR NEGATIVE 04/22/2024 2306   UROBILINOGEN 0.2 02/11/2015  2118   NITRITE NEGATIVE 04/22/2024 2306   LEUKOCYTESUR NEGATIVE 04/22/2024 2306   LEUKOCYTESUR Negative 09/25/2013 0111   Sepsis Labs: @LABRCNTIP (procalcitonin:4,lacticidven:4) )No results found for this or any previous visit (from the past 240 hours).   Radiological Exams on Admission: CT Cervical Spine Wo Contrast Result Date: 08/16/2024 CLINICAL DATA:  Status post fall. EXAM: CT CERVICAL SPINE WITHOUT CONTRAST TECHNIQUE: Multidetector CT imaging of the cervical spine was performed without intravenous contrast. Multiplanar CT image reconstructions were also generated. RADIATION DOSE REDUCTION: This exam was performed according to the departmental dose-optimization program which includes automated exposure control, adjustment of the mA and/or kV according to patient size and/or use of iterative reconstruction technique. COMPARISON:  August 18, 2013 FINDINGS: Alignment: There is reversal of the normal cervical spine lordosis. Skull base and vertebrae: No acute fracture. No primary bone lesion or focal pathologic process. Soft tissues and spinal canal: No prevertebral fluid or swelling. No visible canal  hematoma. Disc levels: Marked severity endplate sclerosis, anterior osteophyte formation and posterior bony spurring are seen at the levels of C3-C4, C5-C6, C6-C7 and C7-T1. Mild to moderate severity anterior osteophyte formation is also present at the level of C4-C5. Marked severity intervertebral disc space narrowing is present at the levels of C3-C4, C5-C6, C6-C7 and C7-T1. Bilateral moderate severity multilevel facet joint hypertrophy is noted. Upper chest: Negative. Other: None. IMPRESSION: 1. Marked severity multilevel degenerative changes, as described above. 2. No acute cervical spine fracture. Electronically Signed   By: Suzen Dials M.D.   On: 08/16/2024 22:48   CT Head Wo Contrast Result Date: 08/16/2024 CLINICAL DATA:  Status post fall. EXAM: CT HEAD WITHOUT CONTRAST TECHNIQUE: Contiguous axial images were obtained from the base of the skull through the vertex without intravenous contrast. RADIATION DOSE REDUCTION: This exam was performed according to the departmental dose-optimization program which includes automated exposure control, adjustment of the mA and/or kV according to patient size and/or use of iterative reconstruction technique. COMPARISON:  December 31, 2018 FINDINGS: Brain: No evidence of acute infarction, hemorrhage, hydrocephalus, extra-axial collection or mass lesion/mass effect. A chronic right basal ganglia lacunar infarct is noted. Vascular: No hyperdense vessel or unexpected calcification. Skull: Normal. Negative for fracture or focal lesion. Sinuses/Orbits: No acute finding. Other: There is mild left frontoparietal scalp soft tissue swelling. IMPRESSION: 1. No acute intracranial abnormality. 2. Chronic right basal ganglia lacunar infarct. 3. Mild left frontoparietal scalp soft tissue swelling. Electronically Signed   By: Suzen Dials M.D.   On: 08/16/2024 22:45    EKG: Independently reviewed.  Poor quality.  Will get a repeat EKG.  Assessment/Plan Principal  Problem:   Hypokalemia Active Problems:   Hypertension   OSA on CPAP   History of CVA (cerebrovascular accident)    Severe hypokalemia with hyponatremia likely related to patient's chlorthalidone  use.  Will replace and recheck.  Discontinue chlorthalidone  for now. Hypertension continue amlodipine  losartan .  Hold chlorthalidone  due to #1. History of CVA with left side hemiplegia on statins and Plavix .  Takes baclofen  for spasms. Mood disorder on quetiapine  Zoloft . Chronic pain on Lyrica . Anemia appears to be chronic follow CBC. BPH on tamsulosin .  EKG is pending.   Since patient has severe electrolyte deficiencies will need close monitoring replacement and further workup and more than 2 midnight stay.   DVT prophylaxis: Lovenox . Code Status: Full code. Family Communication: Discussed with patient. Disposition Plan: Monitored bed. Consults called: None. Admission status: Observation.

## 2024-08-17 DIAGNOSIS — E876 Hypokalemia: Secondary | ICD-10-CM | POA: Diagnosis present

## 2024-08-17 DIAGNOSIS — Z91041 Radiographic dye allergy status: Secondary | ICD-10-CM | POA: Diagnosis not present

## 2024-08-17 DIAGNOSIS — F319 Bipolar disorder, unspecified: Secondary | ICD-10-CM | POA: Diagnosis present

## 2024-08-17 DIAGNOSIS — Z79899 Other long term (current) drug therapy: Secondary | ICD-10-CM | POA: Diagnosis not present

## 2024-08-17 DIAGNOSIS — N400 Enlarged prostate without lower urinary tract symptoms: Secondary | ICD-10-CM | POA: Diagnosis present

## 2024-08-17 DIAGNOSIS — G4733 Obstructive sleep apnea (adult) (pediatric): Secondary | ICD-10-CM

## 2024-08-17 DIAGNOSIS — D638 Anemia in other chronic diseases classified elsewhere: Secondary | ICD-10-CM | POA: Diagnosis present

## 2024-08-17 DIAGNOSIS — Z87891 Personal history of nicotine dependence: Secondary | ICD-10-CM | POA: Diagnosis not present

## 2024-08-17 DIAGNOSIS — Z886 Allergy status to analgesic agent status: Secondary | ICD-10-CM | POA: Diagnosis not present

## 2024-08-17 DIAGNOSIS — S0101XA Laceration without foreign body of scalp, initial encounter: Secondary | ICD-10-CM | POA: Diagnosis present

## 2024-08-17 DIAGNOSIS — E785 Hyperlipidemia, unspecified: Secondary | ICD-10-CM | POA: Diagnosis present

## 2024-08-17 DIAGNOSIS — Z91013 Allergy to seafood: Secondary | ICD-10-CM | POA: Diagnosis not present

## 2024-08-17 DIAGNOSIS — I69354 Hemiplegia and hemiparesis following cerebral infarction affecting left non-dominant side: Secondary | ICD-10-CM | POA: Diagnosis not present

## 2024-08-17 DIAGNOSIS — R531 Weakness: Secondary | ICD-10-CM

## 2024-08-17 DIAGNOSIS — Z8673 Personal history of transient ischemic attack (TIA), and cerebral infarction without residual deficits: Secondary | ICD-10-CM | POA: Diagnosis not present

## 2024-08-17 DIAGNOSIS — Z888 Allergy status to other drugs, medicaments and biological substances status: Secondary | ICD-10-CM | POA: Diagnosis not present

## 2024-08-17 DIAGNOSIS — G8929 Other chronic pain: Secondary | ICD-10-CM | POA: Diagnosis present

## 2024-08-17 DIAGNOSIS — W010XXA Fall on same level from slipping, tripping and stumbling without subsequent striking against object, initial encounter: Secondary | ICD-10-CM | POA: Diagnosis present

## 2024-08-17 DIAGNOSIS — E860 Dehydration: Secondary | ICD-10-CM | POA: Diagnosis present

## 2024-08-17 DIAGNOSIS — G2581 Restless legs syndrome: Secondary | ICD-10-CM | POA: Diagnosis present

## 2024-08-17 DIAGNOSIS — Z7902 Long term (current) use of antithrombotics/antiplatelets: Secondary | ICD-10-CM | POA: Diagnosis not present

## 2024-08-17 DIAGNOSIS — K529 Noninfective gastroenteritis and colitis, unspecified: Secondary | ICD-10-CM | POA: Diagnosis present

## 2024-08-17 DIAGNOSIS — Y92239 Unspecified place in hospital as the place of occurrence of the external cause: Secondary | ICD-10-CM | POA: Diagnosis present

## 2024-08-17 DIAGNOSIS — E871 Hypo-osmolality and hyponatremia: Secondary | ICD-10-CM | POA: Diagnosis present

## 2024-08-17 DIAGNOSIS — Z8616 Personal history of COVID-19: Secondary | ICD-10-CM | POA: Diagnosis not present

## 2024-08-17 DIAGNOSIS — I10 Essential (primary) hypertension: Secondary | ICD-10-CM | POA: Diagnosis present

## 2024-08-17 LAB — URINALYSIS, ROUTINE W REFLEX MICROSCOPIC
Bilirubin Urine: NEGATIVE
Glucose, UA: NEGATIVE mg/dL
Hgb urine dipstick: NEGATIVE
Ketones, ur: NEGATIVE mg/dL
Leukocytes,Ua: NEGATIVE
Nitrite: NEGATIVE
Protein, ur: NEGATIVE mg/dL
Specific Gravity, Urine: 1.009 (ref 1.005–1.030)
pH: 6 (ref 5.0–8.0)

## 2024-08-17 LAB — CBC
HCT: 36.3 % — ABNORMAL LOW (ref 39.0–52.0)
HCT: 34.6 % — ABNORMAL LOW (ref 39.0–52.0)
Hemoglobin: 11.4 g/dL — ABNORMAL LOW (ref 13.0–17.0)
Hemoglobin: 10.8 g/dL — ABNORMAL LOW (ref 13.0–17.0)
MCH: 25.2 pg — ABNORMAL LOW (ref 26.0–34.0)
MCH: 25.1 pg — ABNORMAL LOW (ref 26.0–34.0)
MCHC: 31.4 g/dL (ref 30.0–36.0)
MCHC: 31.2 g/dL (ref 30.0–36.0)
MCV: 80.3 fL (ref 80.0–100.0)
MCV: 80.3 fL (ref 80.0–100.0)
Platelets: 259 K/uL (ref 150–400)
Platelets: 275 K/uL (ref 150–400)
RBC: 4.52 MIL/uL (ref 4.22–5.81)
RBC: 4.31 MIL/uL (ref 4.22–5.81)
RDW: 19.4 % — ABNORMAL HIGH (ref 11.5–15.5)
RDW: 19.6 % — ABNORMAL HIGH (ref 11.5–15.5)
WBC: 8.6 K/uL (ref 4.0–10.5)
WBC: 7.7 K/uL (ref 4.0–10.5)
nRBC: 0 % (ref 0.0–0.2)
nRBC: 0 % (ref 0.0–0.2)

## 2024-08-17 LAB — BASIC METABOLIC PANEL WITH GFR
Anion gap: 9 (ref 5–15)
BUN: 12 mg/dL (ref 6–20)
CO2: 25 mmol/L (ref 22–32)
Calcium: 8.3 mg/dL — ABNORMAL LOW (ref 8.9–10.3)
Chloride: 100 mmol/L (ref 98–111)
Creatinine, Ser: 0.8 mg/dL (ref 0.61–1.24)
GFR, Estimated: >60 mL/min (ref >60)
Glucose, Bld: 100 mg/dL — ABNORMAL HIGH (ref 70–99)
Potassium: 2.9 mmol/L — ABNORMAL LOW (ref 3.5–5.1)
Sodium: 134 mmol/L — ABNORMAL LOW (ref 135–145)

## 2024-08-17 LAB — MAGNESIUM: Magnesium: 1.8 mg/dL (ref 1.7–2.4)

## 2024-08-17 LAB — CREATININE, SERUM
Creatinine, Ser: 0.66 mg/dL (ref 0.61–1.24)
GFR, Estimated: >60 mL/min (ref >60)

## 2024-08-17 LAB — HIV ANTIBODY (ROUTINE TESTING W REFLEX): HIV Screen 4th Generation wRfx: NONREACTIVE

## 2024-08-17 LAB — POTASSIUM: Potassium: 3.3 mmol/L — ABNORMAL LOW (ref 3.5–5.1)

## 2024-08-17 MED ORDER — NICOTINE 21 MG/24HR TD PT24
21.0000 mg | MEDICATED_PATCH | Freq: Every day | TRANSDERMAL | Status: DC
  Administered 2024-08-17 – 2024-08-20 (×4): 21 mg via TRANSDERMAL
  Filled 2024-08-17 (×4): qty 1

## 2024-08-17 MED ORDER — PANTOPRAZOLE SODIUM 40 MG PO TBEC
40.0000 mg | DELAYED_RELEASE_TABLET | Freq: Every day | ORAL | Status: DC
  Administered 2024-08-17 – 2024-08-20 (×4): 40 mg via ORAL
  Filled 2024-08-17 (×4): qty 1

## 2024-08-17 MED ORDER — BACLOFEN 10 MG PO TABS
20.0000 mg | ORAL_TABLET | Freq: Four times a day (QID) | ORAL | Status: DC
  Administered 2024-08-17 – 2024-08-20 (×13): 20 mg via ORAL
  Filled 2024-08-17 (×13): qty 2

## 2024-08-17 MED ORDER — POTASSIUM CHLORIDE CRYS ER 20 MEQ PO TBCR
40.0000 meq | EXTENDED_RELEASE_TABLET | Freq: Two times a day (BID) | ORAL | Status: AC
  Administered 2024-08-17 – 2024-08-18 (×2): 40 meq via ORAL
  Filled 2024-08-17 (×2): qty 2

## 2024-08-17 MED ORDER — POTASSIUM CHLORIDE 20 MEQ PO PACK
60.0000 meq | PACK | ORAL | Status: AC
  Administered 2024-08-17 (×2): 60 meq via ORAL
  Filled 2024-08-17 (×2): qty 3

## 2024-08-17 MED ORDER — POTASSIUM CHLORIDE 10 MEQ/100ML IV SOLN
10.0000 meq | INTRAVENOUS | Status: AC
  Administered 2024-08-17 (×3): 10 meq via INTRAVENOUS
  Filled 2024-08-17: qty 100

## 2024-08-17 MED ORDER — SUMATRIPTAN SUCCINATE 50 MG PO TABS
50.0000 mg | ORAL_TABLET | Freq: Once | ORAL | Status: AC
  Administered 2024-08-17: 50 mg via ORAL
  Filled 2024-08-17: qty 1

## 2024-08-17 MED ORDER — OXYCODONE HCL 5 MG PO TABS
10.0000 mg | ORAL_TABLET | Freq: Four times a day (QID) | ORAL | Status: DC | PRN
  Administered 2024-08-17 – 2024-08-18 (×3): 10 mg via ORAL
  Filled 2024-08-17 (×3): qty 2

## 2024-08-17 NOTE — Progress Notes (Signed)
 [facility]                                                                               Mark Wells, is a 53 y.o. male, DOB - Jan 09, 1971, FMW:[id number] Admit date - 08/16/2024    Outpatient Primary MD for the patient is [facility]  LOS - 0  days    Brief summary   Mark Wells is a 53 y.o. male with history of CVA with left-sided hemiplegia, hypertension, anemia, mood disorder, chronic pain recently admitted at [facility] in April for bowel obstruction was brought to the ER after patient had a fall at home.  Patient states he was walking and slipped on some liquid on the floor and fell onto the floor hit his head ,had a small scalp abrasion with some bleeding.  Denies losing consciousness.  CT head and C-spine is unremarkable.  Labs show severe hypokalemia and mild hyponatremia.  Potassium level was 2.4 sodium 134.  Patient admitted for further observation and replacement.   Assessment & Plan    Assessment and Plan:  Severe hypokalemia and hyponatremia from dehydration from possible gastroenteritis Replace as needed.  Repeat levels in am.    CVA s/p left sided hemiplegia - stable.    Hypertension Well controlled    Mood disorder Resume home meds.   Anemia of chronic disease Hemoglobin stable around 10     Estimated body mass index is 29.05 kg/m as calculated from the following:   Height as of this encounter: 5' 6 (1.676 m).   Weight as of this encounter: 81.6 kg.  Code Status: full code.  DVT Prophylaxis:  enoxaparin  (LOVENOX ) injection 40 mg Start: 08/17/24 1000   Level of Care: Level of care: Med-Surg Family Communication: none a t bedside.   Disposition Plan:     Remains inpatient appropriate:  pending  Procedures:  none  Consultants:   None.  Antimicrobials:   Anti-infectives (From admission, onward)    None        Medications  Scheduled Meds:  amLODipine   5 mg Oral Daily    atorvastatin   40 mg Oral Daily   baclofen   20 mg Oral QID   clopidogrel   75 mg Oral Daily   enoxaparin  (LOVENOX ) injection  40 mg Subcutaneous Q24H   losartan   100 mg Oral Daily   nicotine   21 mg Transdermal Daily   pantoprazole   40 mg Oral Q0600   potassium chloride   40 mEq Oral Once   pregabalin   300 mg Oral BID   QUEtiapine   50 mg Oral QHS   sertraline   150 mg Oral Daily   tamsulosin   0.4 mg Oral Daily   Continuous Infusions: PRN Meds:.acetaminophen  **OR** acetaminophen , oxyCODONE , zolpidem     Subjective:   Mark Wells was seen and examined today.  Reports headache.  Objective:   Vitals:   08/16/24 2200 08/17/24 0223 08/17/24 0753 08/17/24 1503  BP: 100/77 108/77 109/86 108/77  Pulse: (!) 52 91 63 77  Resp: (!) 22 15 16 16   Temp:  98.3 F (36.8 C) 97.6 F (36.4 C) 97.9 F (36.6 C)  TempSrc:  Oral    SpO2:  100% 92% 97% 97%  Weight:      Height:        Intake/Output Summary (Last 24 hours) at 08/17/2024 1905 Last data filed at 08/17/2024 1500 Gross per 24 hour  Intake 660 ml  Output 2100 ml  Net -1440 ml   Filed Weights   08/16/24 2149  Weight: 81.6 kg     Exam General: Alert and oriented x 3, NAD Cardiovascular: S1 S2 auscultated, no murmurs, RRR Respiratory: Clear to auscultation bilaterally, no wheezing, rales or rhonchi Gastrointestinal: Soft, nontender, nondistended, + bowel sounds Ext: no pedal edema Neuro: AAOx3,  Data Reviewed:  I have personally reviewed following labs and imaging studies   CBC Lab Results  Component Value Date   WBC 7.7 08/17/2024   RBC 4.31 08/17/2024   HGB 10.8 (L) 08/17/2024   HCT 34.6 (L) 08/17/2024   MCV 80.3 08/17/2024   MCH 25.1 (L) 08/17/2024   PLT 275 08/17/2024   MCHC 31.2 08/17/2024   RDW 19.6 (H) 08/17/2024   LYMPHSABS 2.9 08/16/2024   MONOABS 1.1 (H) 08/16/2024   EOSABS 0.3 08/16/2024   BASOSABS 0.0 08/16/2024     Last metabolic panel Lab Results  Component Value Date   NA 134 (L) 08/17/2024    K 3.3 (L) 08/17/2024   CL 100 08/17/2024   CO2 25 08/17/2024   BUN 12 08/17/2024   CREATININE 0.80 08/17/2024   GLUCOSE 100 (H) 08/17/2024   GFRNONAA >60 08/17/2024   GFRAA >60 12/20/2019   CALCIUM  8.3 (L) 08/17/2024   PHOS 3.9 12/04/2022   PROT 6.7 08/16/2024   ALBUMIN 3.3 (L) 08/16/2024   BILITOT 0.6 08/16/2024   ALKPHOS 77 08/16/2024   AST 13 (L) 08/16/2024   ALT 11 08/16/2024   ANIONGAP 9 08/17/2024    CBG (last 3)  No results for input(s): GLUCAP in the last 72 hours.    Coagulation Profile: No results for input(s): INR, PROTIME in the last 168 hours.   Radiology Studies: CT Cervical Spine Wo Contrast Result Date: 08/16/2024 CLINICAL DATA:  Status post fall. EXAM: CT CERVICAL SPINE WITHOUT CONTRAST TECHNIQUE: Multidetector CT imaging of the cervical spine was performed without intravenous contrast. Multiplanar CT image reconstructions were also generated. RADIATION DOSE REDUCTION: This exam was performed according to the departmental dose-optimization program which includes automated exposure control, adjustment of the mA and/or kV according to patient size and/or use of iterative reconstruction technique. COMPARISON:  August 18, 2013 FINDINGS: Alignment: There is reversal of the normal cervical spine lordosis. Skull base and vertebrae: No acute fracture. No primary bone lesion or focal pathologic process. Soft tissues and spinal canal: No prevertebral fluid or swelling. No visible canal hematoma. Disc levels: Marked severity endplate sclerosis, anterior osteophyte formation and posterior bony spurring are seen at the levels of C3-C4, C5-C6, C6-C7 and C7-T1. Mild to moderate severity anterior osteophyte formation is also present at the level of C4-C5. Marked severity intervertebral disc space narrowing is present at the levels of C3-C4, C5-C6, C6-C7 and C7-T1. Bilateral moderate severity multilevel facet joint hypertrophy is noted. Upper chest: Negative. Other: None.  IMPRESSION: 1. Marked severity multilevel degenerative changes, as described above. 2. No acute cervical spine fracture. Electronically Signed   By: Suzen Dials M.D.   On: 08/16/2024 22:48   CT Head Wo Contrast Result Date: 08/16/2024 CLINICAL DATA:  Status post fall. EXAM: CT HEAD WITHOUT CONTRAST TECHNIQUE: Contiguous axial images were obtained from the base of the skull through the vertex without intravenous contrast.  RADIATION DOSE REDUCTION: This exam was performed according to the departmental dose-optimization program which includes automated exposure control, adjustment of the mA and/or kV according to patient size and/or use of iterative reconstruction technique. COMPARISON:  December 31, 2018 FINDINGS: Brain: No evidence of acute infarction, hemorrhage, hydrocephalus, extra-axial collection or mass lesion/mass effect. A chronic right basal ganglia lacunar infarct is noted. Vascular: No hyperdense vessel or unexpected calcification. Skull: Normal. Negative for fracture or focal lesion. Sinuses/Orbits: No acute finding. Other: There is mild left frontoparietal scalp soft tissue swelling. IMPRESSION: 1. No acute intracranial abnormality. 2. Chronic right basal ganglia lacunar infarct. 3. Mild left frontoparietal scalp soft tissue swelling. Electronically Signed   By: Suzen Dials M.D.   On: 08/16/2024 22:45       Elgie Butter M.D. [facility] 08/17/2024, 7:05 PM  Available via Epic secure chat 7am-7pm After 7 pm, please refer to night coverage provider listed on amion.

## 2024-08-17 NOTE — Progress Notes (Signed)
 Admission history not done,patient too sleepy and drowsy.

## 2024-08-17 NOTE — Care Management Obs Status (Signed)
 MEDICARE OBSERVATION STATUS NOTIFICATION   Patient Details  Name: Mark Wells MRN: [id number] Date of Birth: [birth year]   Medicare Observation Status Notification Given:  Yes    Jon Cruel 08/17/2024, 11:16 AM

## 2024-08-18 DIAGNOSIS — Z8673 Personal history of transient ischemic attack (TIA), and cerebral infarction without residual deficits: Secondary | ICD-10-CM | POA: Diagnosis not present

## 2024-08-18 DIAGNOSIS — I10 Essential (primary) hypertension: Secondary | ICD-10-CM | POA: Diagnosis not present

## 2024-08-18 DIAGNOSIS — E876 Hypokalemia: Secondary | ICD-10-CM | POA: Diagnosis not present

## 2024-08-18 LAB — CBC WITH DIFFERENTIAL/PLATELET
Abs Immature Granulocytes: 0.07 K/uL (ref 0.00–0.07)
Basophils Absolute: 0.1 K/uL (ref 0.0–0.1)
Basophils Relative: 1 %
Eosinophils Absolute: 0.4 K/uL (ref 0.0–0.5)
Eosinophils Relative: 6 %
HCT: 41.3 % (ref 39.0–52.0)
Hemoglobin: 11.9 g/dL — ABNORMAL LOW (ref 13.0–17.0)
Immature Granulocytes: 1 %
Lymphocytes Relative: 45 %
Lymphs Abs: 3.2 K/uL (ref 0.7–4.0)
MCH: 25.5 pg — ABNORMAL LOW (ref 26.0–34.0)
MCHC: 28.8 g/dL — ABNORMAL LOW (ref 30.0–36.0)
MCV: 88.6 fL (ref 80.0–100.0)
Monocytes Absolute: 0.8 K/uL (ref 0.1–1.0)
Monocytes Relative: 11 %
Neutro Abs: 2.5 K/uL (ref 1.7–7.7)
Neutrophils Relative %: 36 %
Platelets: 166 K/uL (ref 150–400)
RBC: 4.66 MIL/uL (ref 4.22–5.81)
RDW: 20.7 % — ABNORMAL HIGH (ref 11.5–15.5)
WBC: 7 K/uL (ref 4.0–10.5)
nRBC: 0.3 % — ABNORMAL HIGH (ref 0.0–0.2)

## 2024-08-18 LAB — BASIC METABOLIC PANEL WITH GFR
Anion gap: 9 (ref 5–15)
BUN: 7 mg/dL (ref 6–20)
CO2: 24 mmol/L (ref 22–32)
Calcium: 8.6 mg/dL — ABNORMAL LOW (ref 8.9–10.3)
Chloride: 102 mmol/L (ref 98–111)
Creatinine, Ser: 0.65 mg/dL (ref 0.61–1.24)
GFR, Estimated: >60 mL/min (ref >60)
Glucose, Bld: 101 mg/dL — ABNORMAL HIGH (ref 70–99)
Potassium: 4.4 mmol/L (ref 3.5–5.1)
Sodium: 135 mmol/L (ref 135–145)

## 2024-08-18 MED ORDER — OXYCODONE HCL 5 MG PO TABS
5.0000 mg | ORAL_TABLET | Freq: Four times a day (QID) | ORAL | Status: DC | PRN
  Administered 2024-08-18 – 2024-08-20 (×6): 10 mg via ORAL
  Filled 2024-08-18 (×6): qty 2

## 2024-08-18 MED ORDER — PANTOPRAZOLE SODIUM 40 MG PO TBEC: 40.0000 mg | DELAYED_RELEASE_TABLET | Freq: Every day | ORAL | 0 refills | Status: AC

## 2024-08-18 MED ORDER — OXYCODONE HCL 10 MG PO TABS: 10.0000 mg | ORAL_TABLET | Freq: Four times a day (QID) | ORAL | 0 refills | Status: DC | PRN

## 2024-08-18 MED ORDER — OXYCODONE HCL 10 MG PO TABS: 10.0000 mg | ORAL_TABLET | Freq: Four times a day (QID) | ORAL | 0 refills | Status: AC | PRN

## 2024-08-18 NOTE — TOC Initial Note (Signed)
 Transition of Care [facility]) - Initial/Assessment Note    Patient Details  Name: Mark Wells MRN: [id number] Date of Birth: 06/22/1971  Transition of Care [facility]) CM/SW Contact:    Gwenn Julien Norris, LCSW Phone Number: 08/18/2024, 5:21 PM  Clinical Narrative: Patient admitted from [facility] where he is a STR/SNF resident. Pt reports plan is to return to [place] at dc. Spoke to [place] in admissions who confirmed pt is able to return pending new auth. Home and Community/UHC auth request submitted, reference [id number]. Will provide updates as available.   Julien Gwenn, MSW, LCSW [phone] (coverage)     Expected Discharge Plan: Skilled Nursing Facility Barriers to Discharge: Insurance Authorization   Patient Goals and CMS Choice   CMS Medicare.gov Compare Post Acute Care list provided to:: Patient Choice offered to / list presented to : Patient      Expected Discharge Plan and Services     Post Acute Care Choice: Skilled Nursing Facility Living arrangements for the past 2 months: Skilled Nursing Facility Expected Discharge Date: 08/18/24                                    Prior Living Arrangements/Services Living arrangements for the past 2 months: Skilled Nursing Facility Lives with:: Facility Resident Patient language and need for interpreter reviewed:: Yes Do you feel safe going back to the place where you live?: Yes      Need for Family Participation in Patient Care: Yes (Comment) Care giver support system in place?: Yes (comment)   Criminal Activity/Legal Involvement Pertinent to Current Situation/Hospitalization: No - Comment as needed  Activities of Daily Living   ADL Screening (condition at time of admission) Independently performs ADLs?: No Does the patient have a NEW difficulty with bathing/dressing/toileting/self-feeding that is expected to last >3 days?: No Does the patient have a NEW difficulty with getting in/out of bed,  walking, or climbing stairs that is expected to last >3 days?: No Does the patient have a NEW difficulty with communication that is expected to last >3 days?: No  Permission Sought/Granted Permission sought to share information with : Oceanographer granted to share information with : Yes, Verbal Permission Granted              Emotional Assessment       Orientation: : Oriented to Situation, Oriented to  Time, Oriented to Place, Oriented to Self Alcohol / Substance Use: Not Applicable Psych Involvement: No (comment)  Admission diagnosis:  Hypokalemia [E87.6] Weakness [R53.1] Fall, initial encounter [W19.XXXA] Laceration of scalp, initial encounter [S01.01XA] Patient Active Problem List   Diagnosis Date Noted   Hypokalemia 08/16/2024   Confusion 12/03/2022   Fever 12/03/2022   Obesity (BMI 30-39.9) 12/03/2022   History of CVA (cerebrovascular accident) 12/03/2022   Priapism, drug-induced 12/02/2022   OSA on CPAP 12/09/2018   Major depression, recurrent, full remission (HCC) 12/09/2018   Insomnia 12/09/2018   Spastic hemiplegia of left dominant side as late effect of cerebral infarction (HCC) 12/07/2018   Painful orthopaedic hardware (HCC) 09/03/2018   Spastic hemiplegia affecting nondominant side (HCC) 01/24/2018   Chronic, continuous use of opioids 03/25/2014   Depression 03/25/2014   Neuropathic pain 01/28/2014   Low back pain 04/16/2013   Hernia, incisional 04/16/2013   Backache 01/07/2011   ADVERSE DRUG REACTION 01/01/2011   Hypertension 12/28/2010   Chronic pain 12/28/2010   OTHER URINARY INCONTINENCE 12/28/2010   PCP:  [facility] Pharmacy:   [facility] - [place], [place] - [address] [place] [address] Phone: [phone] Fax: [phone]  [facility] [address], [place] - [address] [place] [address] Phone: [phone] Fax:  [phone]  [facility] - [place], [place] - [address] [place] [address] Phone: [phone] Fax: [phone]  [facility] - [place], [place] - [address] [place] [address] Phone: [phone] Fax: [phone]  [facility] [place] - [place], [place] - [address] [place] [address] Phone: [phone] Fax: [phone]     Social Drivers of Health (SDOH) Social History: SDOH Screenings   Food Insecurity: No Food Insecurity (08/17/2024)  Housing: Low Risk  (08/17/2024)  Transportation Needs: No Transportation Needs (08/17/2024)  Utilities: Not At Risk (08/17/2024)  Depression (PHQ2-9): Medium Risk (11/09/2020)  Financial Resource Strain: Low Risk  (04/17/2024)   Received from [facility]  Physical Activity: Sufficiently Active (08/03/2021)  Tobacco Use: High Risk (08/16/2024)   SDOH Interventions:     Readmission Risk Interventions     No data to display

## 2024-08-18 NOTE — Evaluation (Signed)
 Physical Therapy Evaluation Patient Details Name: Mark Wells MRN: [id number] DOB: 1971-09-09 Today's Date: 08/18/2024  History of Present Illness  Mark Wells is a 53 y.o. male who presented to [facility] ED 08/16/24 after fall. Labs significant for severe hypokalemia and mild hyponatremia. PMHx: CVA with left-sided hemiplegia, HTN, anemia, mood disorder, and chronic pain. Of note, recently admitted at [facility] in April for bowel obstruction.   Clinical Impression  Pt admitted with above diagnosis. PTA, pt required assist for functional mobility, ADLs, and IADLs. He was residing at [facility] receiving therapy services. Pt reports he primarily uses a w/c, which he is unable to propel himself. He states he can walk short distances using RW with 1+ assist. Pt currently with functional limitations due to the deficits listed below (see PT Problem List). He required CGA-minA for bed mobility, minA for transfers using RW, and minA for gait using RW. Pt will benefit from acute skilled PT to increase his independence and safety with mobility to allow discharge. Recommend continued inpatient follow up therapy, <3 hours/day.    If plan is discharge home, recommend the following: A little help with walking and/or transfers;A little help with bathing/dressing/bathroom;Assistance with cooking/housework;Assistance with feeding;Assist for transportation;Help with stairs or ramp for entrance;Supervision due to cognitive status   Can travel by private vehicle   No    Equipment Recommendations None recommended by PT  Recommendations for Other Services       Functional Status Assessment Patient has had a recent decline in their functional status and demonstrates the ability to make significant improvements in function in a reasonable and predictable amount of time.     Precautions / Restrictions Precautions Precautions: Fall Recall of Precautions/Restrictions: Impaired Precaution/Restrictions Comments:  L-sided hemiplegia Restrictions Weight Bearing Restrictions Per Provider Order: No      Mobility  Bed Mobility Overal bed mobility: Needs Assistance Bed Mobility: Rolling, Sidelying to Sit Rolling: Used rails, Contact guard assist Sidelying to sit: HOB elevated, Used rails, Contact guard assist, Min assist       General bed mobility comments: Pt sat up on R side of bed with increased time. Cues for sequencing. Assist via bed pad to pivot pt onto hip. Assist to elevate trunk and scoot fwd.    Transfers Overall transfer level: Needs assistance Equipment used: Rolling walker (2 wheels) Transfers: Sit to/from Stand Sit to Stand: Min assist           General transfer comment: Pt stood from lowest bed height. Cued proper hand placement using RW. Powered up with minA. Good eccentric control.    Ambulation/Gait Ambulation/Gait assistance: Min assist Gait Distance (Feet): 6 Feet Assistive device: Rolling walker (2 wheels) Gait Pattern/deviations: Step-to pattern, Decreased step length - right, Decreased step length - left, Decreased stride length Gait velocity: decr     General Gait Details: Pt ambulated with short slow steps. Instructed pt to go around FOB to recliner chair. Cues for sequencing. He initially only took lateral side steps. Cues to advance forwards with feet pointed in the direction he wanted to go. Pt took standing rest breaks to focus on stretching, increasing upright posture and then performed a couple of standing marches. He began to fatigue. Pulled recliner chair closer and assist in manuevering RW for pt to pivot and sit down. He reached back for arm rest.  Stairs            Wheelchair Mobility     Tilt Bed    Modified Rankin (Stroke Patients Only)  Balance Overall balance assessment: Needs assistance, History of Falls Sitting-balance support: Bilateral upper extremity supported, Feet supported Sitting balance-Leahy Scale: Fair Sitting  balance - Comments: Pt sat EOB with close supervision.   Standing balance support: Bilateral upper extremity supported, During functional activity, Reliant on assistive device for balance Standing balance-Leahy Scale: Poor Standing balance comment: Pt dependent on RW.                             Pertinent Vitals/Pain Pain Assessment Pain Assessment: 0-10 Pain Score: 6  Pain Location: lumbar Pain Descriptors / Indicators: Constant, Discomfort, Grimacing, Headache Pain Intervention(s): Monitored during session, Limited activity within patient's tolerance, Repositioned, Patient requesting pain meds-RN notified    Home Living Family/patient expects to be discharged to:: Skilled nursing facility                   Additional Comments: Pt has been recieving therapy services.    Prior Function Prior Level of Function : Patient poor historian/Family not available;Needs assist (Unsure of the accuracy of the information obtained. Pt reporting contradictory items.)       Physical Assist : Mobility (physical);ADLs (physical) Mobility (physical): Bed mobility;Transfers;Gait ADLs (physical): Feeding;Grooming;Bathing;Dressing;Toileting;IADLs Mobility Comments: Pt reports 1+ assist from facility staff for all mobility. He primarily uses a manual w/c, which he is unable to propel himself. Pt reports he can walk a very short distance using RW with assist. Mainly transfers via stand pivots. 3 falls in the past couple of months. ADLs Comments: Pt reports 1+ assist from facility staff for all ADLs/IADLs. He was unable to specify if he toilets using bed pan, BSC, and/or commode. He said they do both sponge baths and getting into shower using shower chair.     Extremity/Trunk Assessment   Upper Extremity Assessment Upper Extremity Assessment: Generalized weakness;Right hand dominant;LUE deficits/detail LUE Deficits / Details: PROM WFL. Hx of hemiplegia secondary to CVA. Pt not  agreeable to attempt AROM or engage in MMT. LUE Sensation: decreased proprioception LUE Coordination: decreased fine motor;decreased gross motor    Lower Extremity Assessment Lower Extremity Assessment: Generalized weakness;LLE deficits/detail LLE Deficits / Details: PROM WFL. Hx of hemiplegia secondary to CVA. Grossly 3-/5 strength. LLE Sensation: decreased proprioception LLE Coordination: decreased gross motor       Communication   Communication Communication: No apparent difficulties    Cognition Arousal: Alert Behavior During Therapy: Flat affect (Hx of mood disorder)   PT - Cognitive impairments: No family/caregiver present to determine baseline, Attention, Sequencing, Safety/Judgement                       PT - Cognition Comments: Pt A,Ox4. He is verbose and easily distracted jumping from one topic to the next. Poor safety awareness. Pt with limited willingess to engage in UE/LE assessment. Following commands: Impaired Following commands impaired: Follows one step commands with increased time, Follows multi-step commands inconsistently, Follows multi-step commands with increased time     Cueing Cueing Techniques: Verbal cues, Gestural cues, Visual cues     General Comments General comments (skin integrity, edema, etc.): Small lac to left posterior scalp.    Exercises     Assessment/Plan    PT Assessment Patient needs continued PT services  PT Problem List Decreased strength;Decreased activity tolerance;Decreased balance;Decreased mobility;Decreased cognition;Decreased knowledge of use of DME;Decreased safety awareness       PT Treatment Interventions Gait training;DME instruction;Functional mobility training;Therapeutic activities;Therapeutic exercise;Balance training;Cognitive remediation;Patient/family  education;Wheelchair mobility training    PT Goals (Current goals can be found in the Care Plan section)  Acute Rehab PT Goals Patient Stated Goal:  Return to rehab PT Goal Formulation: With patient Time For Goal Achievement: 09/01/24 Potential to Achieve Goals: Fair    Frequency Min 2X/week     Co-evaluation               AM-PAC PT 6 Clicks Mobility  Outcome Measure Help needed turning from your back to your side while in a flat bed without using bedrails?: A Little Help needed moving from lying on your back to sitting on the side of a flat bed without using bedrails?: A Little Help needed moving to and from a bed to a chair (including a wheelchair)?: A Little Help needed standing up from a chair using your arms (e.g., wheelchair or bedside chair)?: A Little Help needed to walk in hospital room?: A Little Help needed climbing 3-5 steps with a railing? : A Lot 6 Click Score: 17    End of Session Equipment Utilized During Treatment: Gait belt Activity Tolerance: Patient tolerated treatment well Patient left: in chair;with call bell/phone within reach;with chair alarm set Nurse Communication: Mobility status;Other (comment);Patient requests pain meds (pt request for nicotine  patch) PT Visit Diagnosis: Unsteadiness on feet (R26.81);History of falling (Z91.81);Muscle weakness (generalized) (M62.81);Other abnormalities of gait and mobility (R26.89)    Time: 0800-0820 PT Time Calculation (min) (ACUTE ONLY): 20 min   Charges:   PT Evaluation $PT Eval Moderate Complexity: 1 Mod   PT General Charges $$ ACUTE PT VISIT: 1 Visit         Randall SAUNDERS, PT, DPT Acute Rehabilitation Services Office: [phone] Secure Chat Preferred  Mark Wells 08/18/2024, 9:22 AM

## 2024-08-18 NOTE — Discharge Summary (Signed)
 Physician Discharge Summary   Patient: Mark Wells MRN: [id number] DOB: August 17, 1971  Admit date:     08/16/2024  Discharge date: 08/18/24  Discharge Physician: Elgie Butter   PCP: [facility]   Recommendations at discharge:  {Please follow up with PCP in one week.   Discharge Diagnoses: Principal Problem:   Hypokalemia Active Problems:   Hypertension   Chronic pain   OSA on CPAP   Major depression, recurrent, full remission (HCC)   History of CVA (cerebrovascular accident)  Resolved Problems:   * No resolved hospital problems. *  Hospital Course:  Mark Wells is a 53 y.o. male with history of CVA with left-sided hemiplegia, hypertension, anemia, mood disorder, chronic pain recently admitted at [facility] in April for bowel obstruction was brought to the ER after patient had a fall at home.  Patient states he was walking and slipped on some liquid on the floor and fell onto the floor hit his head ,had a small scalp abrasion with some bleeding.  Denies losing consciousness.  CT head and C-spine is unremarkable.  Labs show severe hypokalemia and mild hyponatremia.  Potassium level was 2.4 sodium 134.  Patient admitted for further observation and replacement.  Assessment and Plan:  Severe hypokalemia and hyponatremia from dehydration from possible gastroenteritis Replace as needed.  Repeat levels in am show much improvement.      CVA s/p left sided hemiplegia - stable.      Hypertension Well controlled      Mood disorder Resume home meds.    Anemia of chronic disease Hemoglobin stable around 10       Estimated body mass index is 29.05 kg/m as calculated from the following:   Height as of this encounter: 5' 6 (1.676 m).   Weight as of this encounter: 81.6 kg.     Consultants: none.  Procedures performed: none.  Disposition: Home Diet recommendation:  Discharge Diet Orders (From admission, onward)     Start     Ordered    08/18/24 0000  Diet - low sodium heart healthy        08/18/24 1641           Regular diet DISCHARGE MEDICATION: Allergies as of 08/18/2024       Reactions   Aspirin Other (See Comments)   Has Gastric Ulcer Other reaction(s): jittery   Ibuprofen Other (See Comments)   Ulcers- bleeding Other reaction(s): rectal bleeding   Shellfish Allergy Swelling   Other reaction(s): SOB   Belsomra [suvorexant] Other (See Comments)   Reaction not listed on MAR   Iodine Swelling   Lunesta [eszopiclone] Other (See Comments)        Medication List     TAKE these medications    amLODipine  5 MG tablet Commonly known as: NORVASC  TAKE 1 TABLET BY MOUTH ONCE DAILY. What changed: when to take this   atorvastatin  40 MG tablet Commonly known as: LIPITOR TAKE 1 TABLET BY MOUTH ONCE DAILY AT BEDTIME FOR  CHOLESTEROL   chlorthalidone  25 MG tablet Commonly known as: HYGROTON  TAKE 1 TABLET BY MOUTH EVERY DAY What changed: when to take this   clopidogrel  75 MG tablet Commonly known as: PLAVIX  TAKE 1 TABLET BY MOUTH ONCE DAILY. What changed: when to take this   losartan  100 MG tablet Commonly known as: COZAAR  TAKE 1 TABLET BY MOUTH  DAILY What changed: when to take this   methocarbamol  500 MG tablet Commonly known as: ROBAXIN  TAKE (1) TABLET  EVERY SIX HOURS AS NEEDED FOR MUSCLE SPASMS.    - MAY MAKE DROWSY -   Oxycodone  HCl 10 MG Tabs Take 1 tablet (10 mg total) by mouth every 6 (six) hours as needed for up to 3 days (chronic pain). What changed: Another medication with the same name was removed. Continue taking this medication, and follow the directions you see here.   pantoprazole  40 MG tablet Commonly known as: PROTONIX  Take 1 tablet (40 mg total) by mouth daily at 6 (six) AM. Start taking on: August 19, 2024   potassium chloride  10 MEQ tablet Commonly known as: KLOR-CON  TAKE 1 TABLET BY MOUTH TWICE  DAILY   pregabalin  300 MG capsule Commonly known as: LYRICA  Take 300 mg  by mouth 2 (two) times daily.   QUEtiapine  50 MG tablet Commonly known as: SEROQUEL  Take 1 tablet by mouth at bedtime.   sertraline  100 MG tablet Commonly known as: ZOLOFT  Take 150 mg by mouth at bedtime.   tamsulosin  0.4 MG Caps capsule Commonly known as: FLOMAX  TAKE 1 CAPSULE BY MOUTH ONCE  DAILY What changed: when to take this   tiZANidine 4 MG tablet Commonly known as: ZANAFLEX Take 4 mg by mouth 3 (three) times daily.        Follow-up Information     [facility] Schedule an appointment as soon as possible for a visit in 1 week(s).   Contact information: [address] [place] [address] [phone]                Discharge Exam: Mark Wells   08/16/24 2149  Weight: 81.6 kg   General exam: Appears calm and comfortable  Respiratory system: Clear to auscultation. Respiratory effort normal. Cardiovascular system: S1 & S2 heard, RRR. No JVD,  Gastrointestinal system: Abdomen is nondistended, soft and nontender.  Central nervous system: Alert and oriented.  Extremities: Symmetric 5 x 5 power. Skin: No rashes,  Psychiatry: Judgement and insight appear normal.    Condition at discharge: fair  The results of significant diagnostics from this hospitalization (including imaging, microbiology, ancillary and laboratory) are listed below for reference.   Imaging Studies: CT Cervical Spine Wo Contrast Result Date: 08/16/2024 CLINICAL DATA:  Status post fall. EXAM: CT CERVICAL SPINE WITHOUT CONTRAST TECHNIQUE: Multidetector CT imaging of the cervical spine was performed without intravenous contrast. Multiplanar CT image reconstructions were also generated. RADIATION DOSE REDUCTION: This exam was performed according to the departmental dose-optimization program which includes automated exposure control, adjustment of the mA and/or kV according to patient size and/or use of iterative reconstruction technique. COMPARISON:  August 18, 2013  FINDINGS: Alignment: There is reversal of the normal cervical spine lordosis. Skull base and vertebrae: No acute fracture. No primary bone lesion or focal pathologic process. Soft tissues and spinal canal: No prevertebral fluid or swelling. No visible canal hematoma. Disc levels: Marked severity endplate sclerosis, anterior osteophyte formation and posterior bony spurring are seen at the levels of C3-C4, C5-C6, C6-C7 and C7-T1. Mild to moderate severity anterior osteophyte formation is also present at the level of C4-C5. Marked severity intervertebral disc space narrowing is present at the levels of C3-C4, C5-C6, C6-C7 and C7-T1. Bilateral moderate severity multilevel facet joint hypertrophy is noted. Upper chest: Negative. Other: None. IMPRESSION: 1. Marked severity multilevel degenerative changes, as described above. 2. No acute cervical spine fracture. Electronically Signed   By: Suzen Dials M.D.   On: 08/16/2024 22:48   CT Head Wo Contrast Result Date: 08/16/2024  CLINICAL DATA:  Status post fall. EXAM: CT HEAD WITHOUT CONTRAST TECHNIQUE: Contiguous axial images were obtained from the base of the skull through the vertex without intravenous contrast. RADIATION DOSE REDUCTION: This exam was performed according to the departmental dose-optimization program which includes automated exposure control, adjustment of the mA and/or kV according to patient size and/or use of iterative reconstruction technique. COMPARISON:  December 31, 2018 FINDINGS: Brain: No evidence of acute infarction, hemorrhage, hydrocephalus, extra-axial collection or mass lesion/mass effect. A chronic right basal ganglia lacunar infarct is noted. Vascular: No hyperdense vessel or unexpected calcification. Skull: Normal. Negative for fracture or focal lesion. Sinuses/Orbits: No acute finding. Other: There is mild left frontoparietal scalp soft tissue swelling. IMPRESSION: 1. No acute intracranial abnormality. 2. Chronic right basal ganglia  lacunar infarct. 3. Mild left frontoparietal scalp soft tissue swelling. Electronically Signed   By: Suzen Dials M.D.   On: 08/16/2024 22:45    Microbiology: Results for orders placed or performed during the hospital encounter of 12/02/22  Resp panel by RT-PCR (RSV, Flu A&B, Covid) Anterior Nasal Swab     Status: None   Collection Time: 12/02/22  2:32 PM   Specimen: Anterior Nasal Swab  Result Value Ref Range Status   SARS Coronavirus 2 by RT PCR NEGATIVE NEGATIVE Final    Comment: (NOTE) SARS-CoV-2 target nucleic acids are NOT DETECTED.  The SARS-CoV-2 RNA is generally detectable in upper respiratory specimens during the acute phase of infection. The lowest concentration of SARS-CoV-2 viral copies this assay can detect is 138 copies/mL. A negative result does not preclude SARS-Cov-2 infection and should not be used as the sole basis for treatment or other patient management decisions. A negative result may occur with  improper specimen collection/handling, submission of specimen other than nasopharyngeal swab, presence of viral mutation(s) within the areas targeted by this assay, and inadequate number of viral copies(<138 copies/mL). A negative result must be combined with clinical observations, patient history, and epidemiological information. The expected result is Negative.  Fact Sheet for Patients:  BloggerCourse.com  Fact Sheet for Healthcare Providers:  SeriousBroker.it  This test is no t yet approved or cleared by the [place]  FDA and  has been authorized for detection and/or diagnosis of SARS-CoV-2 by FDA under an Emergency Use Authorization (EUA). This EUA will remain  in effect (meaning this test can be used) for the duration of the COVID-19 declaration under Section 564(b)(1) of the Act, 21 U.S.C.section 360bbb-3(b)(1), unless the authorization is terminated  or revoked sooner.       Influenza A by PCR  NEGATIVE NEGATIVE Final   Influenza B by PCR NEGATIVE NEGATIVE Final    Comment: (NOTE) The Xpert Xpress SARS-CoV-2/FLU/RSV plus assay is intended as an aid in the diagnosis of influenza from Nasopharyngeal swab specimens and should not be used as a sole basis for treatment. Nasal washings and aspirates are unacceptable for Xpert Xpress SARS-CoV-2/FLU/RSV testing.  Fact Sheet for Patients: BloggerCourse.com  Fact Sheet for Healthcare Providers: SeriousBroker.it  This test is not yet approved or cleared by the [place]  FDA and has been authorized for detection and/or diagnosis of SARS-CoV-2 by FDA under an Emergency Use Authorization (EUA). This EUA will remain in effect (meaning this test can be used) for the duration of the COVID-19 declaration under Section 564(b)(1) of the Act, 21 U.S.C. section 360bbb-3(b)(1), unless the authorization is terminated or revoked.     Resp Syncytial Virus by PCR NEGATIVE NEGATIVE Final    Comment: (NOTE) Fact Sheet  for Patients: BloggerCourse.com  Fact Sheet for Healthcare Providers: SeriousBroker.it  This test is not yet approved or cleared by the [place]  FDA and has been authorized for detection and/or diagnosis of SARS-CoV-2 by FDA under an Emergency Use Authorization (EUA). This EUA will remain in effect (meaning this test can be used) for the duration of the COVID-19 declaration under Section 564(b)(1) of the Act, 21 U.S.C. section 360bbb-3(b)(1), unless the authorization is terminated or revoked.  Performed at [facility], [address], [place], [place] [address]   Culture, blood (x 2)     Status: None   Collection Time: 12/03/22  3:25 AM   Specimen: BLOOD  Result Value Ref Range Status   Specimen Description   Final    BLOOD BLOOD RIGHT HAND Performed at [facility], [address], [place],  [place] [address]    Special Requests   Final    BOTTLES DRAWN AEROBIC AND ANAEROBIC Blood Culture results may not be optimal due to an excessive volume of blood received in culture bottles Performed at [facility], [address] [place], [place] [address]    Culture   Final    NO GROWTH 5 DAYS Performed at [facility], [address] [place], [place] [address]    Report Status 12/08/2022 FINAL  Final  Culture, blood (x 2)     Status: None   Collection Time: 12/03/22  4:15 AM   Specimen: BLOOD  Result Value Ref Range Status   Specimen Description   Final    BLOOD BLOOD RIGHT HAND Performed at [facility], [address], [place], [place] [address]    Special Requests   Final    BOTTLES DRAWN AEROBIC AND ANAEROBIC Blood Culture results may not be optimal due to an excessive volume of blood received in culture bottles Performed at [facility], [address] [place], [place] [address]    Culture   Final    NO GROWTH 5 DAYS Performed at [facility], [address] [place], [place] [address]    Report Status 12/08/2022 FINAL  Final    Labs: CBC: Recent Labs  Lab 08/16/24 2239 08/17/24 0154 08/17/24 0706 08/18/24 0645  WBC 8.3 8.6 7.7 7.0  NEUTROABS 3.9  --   --  2.5  HGB 11.5* 11.4* 10.8* 11.9*  HCT 36.7* 36.3* 34.6* 41.3  MCV 80.7 80.3 80.3 88.6  PLT 272 259 275 166   Basic Metabolic Panel: Recent Labs  Lab 08/16/24 2239 08/17/24 0154 08/17/24 0706 08/17/24 1443 08/18/24 1405  NA 134*  --  134*  --  135  K 2.4*  --  2.9* 3.3* 4.4  CL 95*  --  100  --  102  CO2 26  --  25  --  24  GLUCOSE 109*  --  100*  --  101*  BUN 15  --  12  --  7  CREATININE 0.71 0.66 0.80  --  0.65  CALCIUM  8.2*  --  8.3*  --  8.6*  MG  --   --  1.8  --   --    Liver Function Tests: Recent Labs  Lab 08/16/24 2239  AST 13*  ALT 11  ALKPHOS 77  BILITOT 0.6  PROT 6.7  ALBUMIN 3.3*   CBG: No results for  input(s): GLUCAP in the last 168 hours.  Discharge time spent: 35  minutes.   Signed: Kerianna Rawlinson,  MD [facility] 08/18/2024

## 2024-08-19 DIAGNOSIS — I10 Essential (primary) hypertension: Secondary | ICD-10-CM | POA: Diagnosis not present

## 2024-08-19 DIAGNOSIS — E876 Hypokalemia: Secondary | ICD-10-CM | POA: Diagnosis not present

## 2024-08-19 DIAGNOSIS — Z8673 Personal history of transient ischemic attack (TIA), and cerebral infarction without residual deficits: Secondary | ICD-10-CM | POA: Diagnosis not present

## 2024-08-19 NOTE — Plan of Care (Signed)
   Problem: Education: Goal: Knowledge of General Education information will improve Description Including pain rating scale, medication(s)/side effects and non-pharmacologic comfort measures Outcome: Progressing   Problem: Health Behavior/Discharge Planning: Goal: Ability to manage health-related needs will improve Outcome: Progressing

## 2024-08-19 NOTE — TOC Progression Note (Addendum)
 Transition of Care [facility]) - Progression Note    Patient Details  Name: [place] Head MRN: [id number] Date of Birth: [birth year]  [facility]) CM/SW Contact  Bridget Cordella Simmonds, LCSW Phone Number: 08/19/2024, 8:47 AM  Clinical Narrative:   SNF auth remains pending in [place].   1610: P2P offered by [employer]: by 1130am tomorrow, [phone], option 5.  MD informed.   Expected Discharge Plan: Skilled Nursing Facility Barriers to Discharge: Insurance Authorization               Expected Discharge Plan and Services     Post Acute Care Choice: Skilled Nursing Facility Living arrangements for the past 2 months: Skilled Nursing Facility Expected Discharge Date: 08/18/24                                     Social Drivers of Health (SDOH) Interventions SDOH Screenings   Food Insecurity: No Food Insecurity (08/17/2024)  Housing: Low Risk  (08/17/2024)  Transportation Needs: No Transportation Needs (08/17/2024)  Utilities: Not At Risk (08/17/2024)  Depression (PHQ2-9): Medium Risk (11/09/2020)  Financial Resource Strain: Low Risk  (04/17/2024)   Received from [facility]  Physical Activity: Sufficiently Active (08/03/2021)  Tobacco Use: High Risk (08/16/2024)    Readmission Risk Interventions     No data to display

## 2024-08-19 NOTE — Progress Notes (Signed)
 [facility]                                                                               Mark Wells, is a 53 y.o. male, DOB - 06/10/1971, FMW:[id number] Admit date - 08/16/2024    Outpatient Primary MD for the patient is [facility]  LOS - 2  days    Brief summary   Mark Wells is a 54 y.o. male with history of CVA with left-sided hemiplegia, hypertension, anemia, mood disorder, chronic pain recently admitted at [facility] in April for bowel obstruction was brought to the ER after patient had a fall at home.  Patient states he was walking and slipped on some liquid on the floor and fell onto the floor hit his head ,had a small scalp abrasion with some bleeding.  Denies losing consciousness.  CT head and C-spine is unremarkable.  Labs show severe hypokalemia and mild hyponatremia.  Potassium level was 2.4 sodium 134.  Patient admitted for further observation and replacement.   Assessment & Plan    Assessment and Plan:  Severe hypokalemia and hyponatremia from dehydration from possible gastroenteritis Replace as needed.     CVA s/p left sided hemiplegia - stable.    Hypertension Well controlled    Mood disorder Resume home meds.   Anemia of chronic disease Hemoglobin stable around 10     Estimated body mass index is 29.05 kg/m as calculated from the following:   Height as of this encounter: 5' 6 (1.676 m).   Weight as of this encounter: 81.6 kg.  Code Status: full code.  DVT Prophylaxis:  enoxaparin  (LOVENOX ) injection 40 mg Start: 08/17/24 1000   Level of Care: Level of care: Med-Surg Family Communication: none a t bedside.   Disposition Plan:     Remains inpatient appropriate:  waiting for SNF.   Procedures:  none  Consultants:   None.  Antimicrobials:   Anti-infectives (From admission, onward)    None        Medications  Scheduled Meds:  amLODipine   5 mg Oral Daily   atorvastatin   40 mg  Oral Daily   baclofen   20 mg Oral QID   clopidogrel   75 mg Oral Daily   enoxaparin  (LOVENOX ) injection  40 mg Subcutaneous Q24H   losartan   100 mg Oral Daily   nicotine   21 mg Transdermal Daily   pantoprazole   40 mg Oral Q0600   potassium chloride   40 mEq Oral Once   pregabalin   300 mg Oral BID   QUEtiapine   50 mg Oral QHS   sertraline   150 mg Oral Daily   tamsulosin   0.4 mg Oral Daily   Continuous Infusions: PRN Meds:.acetaminophen  **OR** acetaminophen , oxyCODONE , zolpidem     Subjective:   Mark Wells was seen and examined today.  NO new complaints.  Objective:   Vitals:   08/19/24 0447 08/19/24 0822 08/19/24 0824 08/19/24 1525  BP: (!) 139/94  124/89 124/79  Pulse: 60 87 60 66  Resp: 16 16  16   Temp: 98 F (36.7 C) (!) 96.4 F (35.8 C)    TempSrc: Oral Rectal    SpO2: 99% 100% 100%  96%  Weight:      Height:        Intake/Output Summary (Last 24 hours) at 08/19/2024 1834 Last data filed at 08/19/2024 1500 Gross per 24 hour  Intake 600 ml  Output 1375 ml  Net -775 ml   Filed Weights   08/16/24 2149  Weight: 81.6 kg     Exam General exam: Appears calm and comfortable  Respiratory system: Clear to auscultation. Respiratory effort normal. Cardiovascular system: S1 & S2 heard, RRR. Gastrointestinal system: Abdomen is nondistended, soft and nontender.  Central nervous system: Alert and oriented.  Extremities: Symmetric 5 x 5 power. Skin: No rashes, lesions or ulcers Psychiatry:Mood & affect appropriate.    Data Reviewed:  I have personally reviewed following labs and imaging studies   CBC Lab Results  Component Value Date   WBC 7.0 08/18/2024   RBC 4.66 08/18/2024   HGB 11.9 (L) 08/18/2024   HCT 41.3 08/18/2024   MCV 88.6 08/18/2024   MCH 25.5 (L) 08/18/2024   PLT 166 08/18/2024   MCHC 28.8 (L) 08/18/2024   RDW 20.7 (H) 08/18/2024   LYMPHSABS 3.2 08/18/2024   MONOABS 0.8 08/18/2024   EOSABS 0.4 08/18/2024   BASOSABS 0.1 08/18/2024      Last metabolic panel Lab Results  Component Value Date   NA 135 08/18/2024   K 4.4 08/18/2024   CL 102 08/18/2024   CO2 24 08/18/2024   BUN 7 08/18/2024   CREATININE 0.65 08/18/2024   GLUCOSE 101 (H) 08/18/2024   GFRNONAA >60 08/18/2024   GFRAA >60 12/20/2019   CALCIUM  8.6 (L) 08/18/2024   PHOS 3.9 12/04/2022   PROT 6.7 08/16/2024   ALBUMIN 3.3 (L) 08/16/2024   BILITOT 0.6 08/16/2024   ALKPHOS 77 08/16/2024   AST 13 (L) 08/16/2024   ALT 11 08/16/2024   ANIONGAP 9 08/18/2024    CBG (last 3)  No results for input(s): GLUCAP in the last 72 hours.    Coagulation Profile: No results for input(s): INR, PROTIME in the last 168 hours.   Radiology Studies: No results found.      Elgie Butter M.D. [facility] 08/19/2024, 6:34 PM  Available via Epic secure chat 7am-7pm After 7 pm, please refer to night coverage provider listed on amion.

## 2024-08-19 NOTE — TOC CAGE-AID Note (Signed)
 Transition of Care [facility]) - CAGE-AID Screening   Patient Details  Name: Mark Wells MRN: [id number] Date of Birth: 04/26/1971  [facility]) CM/SW Contact:    Serapio Edelson E Bonny Vanleeuwen, LCSW Phone Number: 08/19/2024, 10:45 AM   Clinical Narrative: Patient denies drug or alcohol use.    CAGE-AID Screening:    Have You Ever Felt You Ought to Cut Down on Your Drinking or Drug Use?: No Have People Annoyed You By Critizing Your Drinking Or Drug Use?: No Have You Felt Bad Or Guilty About Your Drinking Or Drug Use?: No Have You Ever Had a Drink or Used Drugs First Thing In The Morning to Steady Your Nerves or to Get Rid of a Hangover?: No CAGE-AID Score: 0  Substance Abuse Education Offered: No

## 2024-08-20 DIAGNOSIS — Z8673 Personal history of transient ischemic attack (TIA), and cerebral infarction without residual deficits: Secondary | ICD-10-CM | POA: Diagnosis not present

## 2024-08-20 DIAGNOSIS — E876 Hypokalemia: Secondary | ICD-10-CM | POA: Diagnosis not present

## 2024-08-20 DIAGNOSIS — I10 Essential (primary) hypertension: Secondary | ICD-10-CM | POA: Diagnosis not present

## 2024-08-20 NOTE — TOC Transition Note (Deleted)
 Transition of Care [facility]) - Discharge Note   Patient Details  Name: Mark Wells MRN: [id number] Date of Birth: 05-15-1971  Transition of Care [facility]) CM/SW Contact:  Bridget Cordella Simmonds, LCSW Phone Number: 08/20/2024, 10:29 AM   Clinical Narrative:   TC [employer]: SNF auth denied.  Stated pt can receive services intermittantly.  Appeal phone number: [phone].    Final next level of care: Skilled Nursing Facility Barriers to Discharge: Insurance Authorization   Patient Goals and CMS Choice   CMS Medicare.gov Compare Post Acute Care list provided to:: Patient Choice offered to / list presented to : Patient      Discharge Placement                       Discharge Plan and Services Additional resources added to the After Visit Summary for       Post Acute Care Choice: Skilled Nursing Facility                               Social Drivers of Health (SDOH) Interventions SDOH Screenings   Food Insecurity: No Food Insecurity (08/17/2024)  Housing: Low Risk  (08/17/2024)  Transportation Needs: No Transportation Needs (08/17/2024)  Utilities: Not At Risk (08/17/2024)  Depression (PHQ2-9): Medium Risk (11/09/2020)  Financial Resource Strain: Low Risk  (04/17/2024)   [facility]  Physical Activity: Sufficiently Active (08/03/2021)  Tobacco Use: High Risk (08/16/2024)     Readmission Risk Interventions     No data to display

## 2024-08-20 NOTE — Plan of Care (Signed)

## 2024-08-20 NOTE — Plan of Care (Signed)

## 2024-08-20 NOTE — Progress Notes (Addendum)
   08/20/24 1015  What Happened  Was fall witnessed? No  Was patient injured? No  Patient found on floor  Found by Staff-comment Louanne RN)  Stated prior activity other (comment) (repositioning on side of bed)  Provider Notification  Provider Name/Title Dr. Cherlyn  Date Provider Notified 08/20/24  Time Provider Notified 1020  Method of Notification Face-to-face  Notification Reason Fall  Provider response At bedside  Date of Provider Response 08/20/24  Time of Provider Response 1020  Follow Up  Family notified No - patient refusal  Additional tests No  Simple treatment Other (comment) (na)  Progress note created (see row info) Yes  Adult Fall Risk Assessment  Risk Factor Category (scoring not indicated) High fall risk per protocol (document High fall risk)  Adult Fall Risk Interventions  Additional Interventions Use of appropriate toileting equipment (bedpan, BSC, etc.)  Screening for Fall Injury Risk (To be completed on HIGH fall risk patients) - Assessing Need for Floor Mats  Risk For Fall Injury- Criteria for Floor Mats Previous fall this admission  Will Implement Floor Mats Yes  Vitals  Temp 98.7 F (37.1 C)  Temp Source Oral  BP 128/89  BP Location Right Arm  BP Method Automatic  Patient Position (if appropriate) Sitting  Pulse Rate 77  Pulse Rate Source Dinamap  Resp 18  Oxygen Therapy  SpO2 98 %  O2 Device Room Air  Neurological  Neuro (WDL) WDL  Level of Consciousness Alert  Orientation Level Oriented X4  Glasgow Coma Scale  Eye Opening 4  Best Verbal Response (NON-intubated) 5  Best Motor Response 6  Glasgow Coma Scale Score 15  Musculoskeletal  Musculoskeletal (WDL) X  Assistive Device BSC  Generalized Weakness Yes  Weight Bearing Restrictions Per Provider Order No  Musculoskeletal Details  RUE Limited movement  LUE Limited movement  RLE Limited movement  LLE Limited movement  Integumentary  Integumentary (WDL) X  Skin Color Appropriate for  ethnicity  Skin Condition Dry;Flaky  Skin Integrity Intact   Pt bed alarm began alarming and RN walked into room and found pt beside bed leaning on it but on his knees. Pt states he was trying to reposition and slid down the side. Patient denies any pain or hitting head. No visible signs of injury noted. Vitals obtained and are listed above. Dr. Cherlyn on unit at time of fall and came to bedside. No new orders received.

## 2024-08-20 NOTE — TOC Transition Note (Signed)
 Transition of Care [facility]) - Discharge Note   Patient Details  Name: Mark Wells MRN: [id number] Date of Birth: 1971/03/26  Transition of Care [facility]) CM/SW Contact:  Bridget Cordella Simmonds, LCSW Phone Number: 08/20/2024, 1:35 PM   Clinical Narrative:  Pt discharging to [facility], room 404-A.  RN report to [phone].  PTAR called 1330.    Final next level of care: Long Term Nursing Home Barriers to Discharge: Barriers Resolved   Patient Goals and CMS Choice   CMS Medicare.gov Compare Post Acute Care list provided to:: Patient Choice offered to / list presented to : Patient      Discharge Placement              Patient chooses bed at:  [name] Patient to be transferred to facility by: PTAR Name of family member notified: wife [name] Patient and family notified of of transfer: 08/20/24  Discharge Plan and Services Additional resources added to the After Visit Summary for       Post Acute Care Choice: Skilled Nursing Facility                               Social Drivers of Health (SDOH) Interventions SDOH Screenings   Food Insecurity: No Food Insecurity (08/17/2024)  Housing: Low Risk  (08/17/2024)  Transportation Needs: No Transportation Needs (08/17/2024)  Utilities: Not At Risk (08/17/2024)  Depression (PHQ2-9): Medium Risk (11/09/2020)  Financial Resource Strain: Low Risk  (04/17/2024)   Received from [facility]  Physical Activity: Sufficiently Active (08/03/2021)  Tobacco Use: High Risk (08/16/2024)     Readmission Risk Interventions     No data to display

## 2024-08-20 NOTE — TOC Progression Note (Addendum)
 Transition of Care [facility]) - Progression Note    Patient Details  Name: Mark Wells MRN: [id number] Date of Birth: 1971/04/28  Transition of Care [facility]) CM/SW Contact  Bridget Cordella Simmonds, LCSW Phone Number: 08/20/2024, 1:10 PM  Clinical Narrative:   TC Navi: SNF auth denied. Stated pt can receive services intermittantly. Appeal phone number: [phone].   1315: TC [name]: pt is in LTC at [place] since July 2025.  She does want pt to return.  CSW confirmed with Allison/[place], who confirmed that pt is LTC, not STR as previously reported.  They can receive pt back today.   Expected Discharge Plan: Skilled Nursing Facility Barriers to Discharge: Insurance Authorization               Expected Discharge Plan and Services     Post Acute Care Choice: Skilled Nursing Facility Living arrangements for the past 2 months: Skilled Nursing Facility Expected Discharge Date: 08/18/24                                     Social Drivers of Health (SDOH) Interventions SDOH Screenings   Food Insecurity: No Food Insecurity (08/17/2024)  Housing: Low Risk  (08/17/2024)  Transportation Needs: No Transportation Needs (08/17/2024)  Utilities: Not At Risk (08/17/2024)  Depression (PHQ2-9): Medium Risk (11/09/2020)  Financial Resource Strain: Low Risk  (04/17/2024)   Received from [facility]  Physical Activity: Sufficiently Active (08/03/2021)  Tobacco Use: High Risk (08/16/2024)    Readmission Risk Interventions     No data to display

## 2024-08-20 NOTE — Care Management Important Message (Signed)
 Important Message  Patient Details  Name: Mark Wells MRN: [id number] Date of Birth: 1971/09/20   Important Message Given:  Yes - Medicare IM     Jon Cruel 08/20/2024, 4:05 PM

## 2024-08-20 NOTE — Progress Notes (Signed)
 Attempted to call Mayola to give report and no answer.

## 2024-08-20 NOTE — Progress Notes (Addendum)
 AVS completed for discharge packet and placed with chart. IV removed.

## 2024-08-20 NOTE — Discharge Summary (Signed)
 Physician Discharge Summary   Patient: Mark Wells MRN: [id number] DOB: September 07, 1971  Admit date:     08/16/2024  Discharge date: 08/20/24  Discharge Physician: Elgie Butter   PCP: [facility]   Recommendations at discharge:  {Please follow up with PCP in one week.   Discharge Diagnoses: Principal Problem:   Hypokalemia Active Problems:   Hypertension   Chronic pain   OSA on CPAP   Major depression, recurrent, full remission (HCC)   History of CVA (cerebrovascular accident)  Resolved Problems:   * No resolved hospital problems. *  Hospital Course:  Mark Wells is a 53 y.o. male with history of CVA with left-sided hemiplegia, hypertension, anemia, mood disorder, chronic pain recently admitted at [facility] in April for bowel obstruction was brought to the ER after patient had a fall at home.  Patient states he was walking and slipped on some liquid on the floor and fell onto the floor hit his head ,had a small scalp abrasion with some bleeding.  Denies losing consciousness.  CT head and C-spine is unremarkable.  Labs show severe hypokalemia and mild hyponatremia.  Potassium level was 2.4 sodium 134.  Patient admitted for further observation and replacement.    Assessment and Plan:  Severe hypokalemia and hyponatremia from dehydration from possible gastroenteritis Replaced. Repeat levels in am show much improvement.      CVA s/p left sided hemiplegia - stable.      Hypertension Well controlled      Mood disorder Resume home meds.    Anemia of chronic disease Hemoglobin stable around 10       Estimated body mass index is 29.05 kg/m as calculated from the following:   Height as of this encounter: 5' 6 (1.676 m).   Weight as of this encounter: 81.6 kg.     Consultants: none.  Procedures performed: none.  Disposition: Home Diet recommendation:  Discharge Diet Orders (From admission, onward)     Start     Ordered    08/18/24 0000  Diet - low sodium heart healthy        08/18/24 1641           Regular diet DISCHARGE MEDICATION: Allergies as of 08/20/2024       Reactions   Aspirin Other (See Comments)   Has Gastric Ulcer Other reaction(s): jittery   Ibuprofen Other (See Comments)   Ulcers- bleeding Other reaction(s): rectal bleeding   Shellfish Allergy Swelling   Other reaction(s): SOB   Belsomra [suvorexant] Other (See Comments)   Reaction not listed on MAR   Iodine Swelling   Lunesta [eszopiclone] Other (See Comments)        Medication List     TAKE these medications    amLODipine  5 MG tablet Commonly known as: NORVASC  TAKE 1 TABLET BY MOUTH ONCE DAILY. What changed: when to take this   atorvastatin  40 MG tablet Commonly known as: LIPITOR TAKE 1 TABLET BY MOUTH ONCE DAILY AT BEDTIME FOR  CHOLESTEROL   chlorthalidone  25 MG tablet Commonly known as: HYGROTON  TAKE 1 TABLET BY MOUTH EVERY DAY What changed: when to take this   clopidogrel  75 MG tablet Commonly known as: PLAVIX  TAKE 1 TABLET BY MOUTH ONCE DAILY. What changed: when to take this   losartan  100 MG tablet Commonly known as: COZAAR  TAKE 1 TABLET BY MOUTH  DAILY What changed: when to take this   methocarbamol  500 MG tablet Commonly known as: ROBAXIN  TAKE (1) TABLET EVERY  SIX HOURS AS NEEDED FOR MUSCLE SPASMS.    - MAY MAKE DROWSY -   Oxycodone  HCl 10 MG Tabs Take 1 tablet (10 mg total) by mouth every 6 (six) hours as needed for up to 3 days (chronic pain). What changed: Another medication with the same name was removed. Continue taking this medication, and follow the directions you see here.   pantoprazole  40 MG tablet Commonly known as: PROTONIX  Take 1 tablet (40 mg total) by mouth daily at 6 (six) AM.   potassium chloride  10 MEQ tablet Commonly known as: KLOR-CON  TAKE 1 TABLET BY MOUTH TWICE  DAILY   pregabalin  300 MG capsule Commonly known as: LYRICA  Take 300 mg by mouth 2 (two) times daily.    QUEtiapine  50 MG tablet Commonly known as: SEROQUEL  Take 1 tablet by mouth at bedtime.   sertraline  100 MG tablet Commonly known as: ZOLOFT  Take 150 mg by mouth at bedtime.   tamsulosin  0.4 MG Caps capsule Commonly known as: FLOMAX  TAKE 1 CAPSULE BY MOUTH ONCE  DAILY What changed: when to take this   tiZANidine 4 MG tablet Commonly known as: ZANAFLEX Take 4 mg by mouth 3 (three) times daily.        Follow-up Information     [facility] Schedule an appointment as soon as possible for a visit in 1 week(s).   Contact information: [address] [place] [address] [phone]                Discharge Exam: [name]   08/16/24 2149  Weight: 81.6 kg   General exam: Appears calm and comfortable  Respiratory system: Clear to auscultation. Respiratory effort normal. Cardiovascular system: S1 & S2 heard, RRR. No JVD,  Gastrointestinal system: Abdomen is nondistended, soft and nontender.  Central nervous system: Alert and oriented. Left sided hemiparesis Extremities: no edema. Skin: No rashes,  Psychiatry: mood is appropriate.   Condition at discharge: fair  The results of significant diagnostics from this hospitalization (including imaging, microbiology, ancillary and laboratory) are listed below for reference.   Imaging Studies: CT Cervical Spine Wo Contrast Result Date: 08/16/2024 CLINICAL DATA:  Status post fall. EXAM: CT CERVICAL SPINE WITHOUT CONTRAST TECHNIQUE: Multidetector CT imaging of the cervical spine was performed without intravenous contrast. Multiplanar CT image reconstructions were also generated. RADIATION DOSE REDUCTION: This exam was performed according to the departmental dose-optimization program which includes automated exposure control, adjustment of the mA and/or kV according to patient size and/or use of iterative reconstruction technique. COMPARISON:  August 18, 2013 FINDINGS: Alignment: There is reversal of  the normal cervical spine lordosis. Skull base and vertebrae: No acute fracture. No primary bone lesion or focal pathologic process. Soft tissues and spinal canal: No prevertebral fluid or swelling. No visible canal hematoma. Disc levels: Marked severity endplate sclerosis, anterior osteophyte formation and posterior bony spurring are seen at the levels of C3-C4, C5-C6, C6-C7 and C7-T1. Mild to moderate severity anterior osteophyte formation is also present at the level of C4-C5. Marked severity intervertebral disc space narrowing is present at the levels of C3-C4, C5-C6, C6-C7 and C7-T1. Bilateral moderate severity multilevel facet joint hypertrophy is noted. Upper chest: Negative. Other: None. IMPRESSION: 1. Marked severity multilevel degenerative changes, as described above. 2. No acute cervical spine fracture. Electronically Signed   By: Suzen Dials M.D.   On: 08/16/2024 22:48   CT Head Wo Contrast Result Date: 08/16/2024 CLINICAL DATA:  Status post fall. EXAM: CT HEAD WITHOUT CONTRAST  TECHNIQUE: Contiguous axial images were obtained from the base of the skull through the vertex without intravenous contrast. RADIATION DOSE REDUCTION: This exam was performed according to the departmental dose-optimization program which includes automated exposure control, adjustment of the mA and/or kV according to patient size and/or use of iterative reconstruction technique. COMPARISON:  December 31, 2018 FINDINGS: Brain: No evidence of acute infarction, hemorrhage, hydrocephalus, extra-axial collection or mass lesion/mass effect. A chronic right basal ganglia lacunar infarct is noted. Vascular: No hyperdense vessel or unexpected calcification. Skull: Normal. Negative for fracture or focal lesion. Sinuses/Orbits: No acute finding. Other: There is mild left frontoparietal scalp soft tissue swelling. IMPRESSION: 1. No acute intracranial abnormality. 2. Chronic right basal ganglia lacunar infarct. 3. Mild left  frontoparietal scalp soft tissue swelling. Electronically Signed   By: Suzen Dials M.D.   On: 08/16/2024 22:45    Microbiology: Results for orders placed or performed during the hospital encounter of 12/02/22  Resp panel by RT-PCR (RSV, Flu A&B, Covid) Anterior Nasal Swab     Status: None   Collection Time: 12/02/22  2:32 PM   Specimen: Anterior Nasal Swab  Result Value Ref Range Status   SARS Coronavirus 2 by RT PCR NEGATIVE NEGATIVE Final    Comment: (NOTE) SARS-CoV-2 target nucleic acids are NOT DETECTED.  The SARS-CoV-2 RNA is generally detectable in upper respiratory specimens during the acute phase of infection. The lowest concentration of SARS-CoV-2 viral copies this assay can detect is 138 copies/mL. A negative result does not preclude SARS-Cov-2 infection and should not be used as the sole basis for treatment or other patient management decisions. A negative result may occur with  improper specimen collection/handling, submission of specimen other than nasopharyngeal swab, presence of viral mutation(s) within the areas targeted by this assay, and inadequate number of viral copies(<138 copies/mL). A negative result must be combined with clinical observations, patient history, and epidemiological information. The expected result is Negative.  Fact Sheet for Patients:  BloggerCourse.com  Fact Sheet for Healthcare Providers:  SeriousBroker.it  This test is no t yet approved or cleared by the [place]  FDA and  has been authorized for detection and/or diagnosis of SARS-CoV-2 by FDA under an Emergency Use Authorization (EUA). This EUA will remain  in effect (meaning this test can be used) for the duration of the COVID-19 declaration under Section 564(b)(1) of the Act, 21 U.S.C.section 360bbb-3(b)(1), unless the authorization is terminated  or revoked sooner.       Influenza A by PCR NEGATIVE NEGATIVE Final    Influenza B by PCR NEGATIVE NEGATIVE Final    Comment: (NOTE) The Xpert Xpress SARS-CoV-2/FLU/RSV plus assay is intended as an aid in the diagnosis of influenza from Nasopharyngeal swab specimens and should not be used as a sole basis for treatment. Nasal washings and aspirates are unacceptable for Xpert Xpress SARS-CoV-2/FLU/RSV testing.  Fact Sheet for Patients: BloggerCourse.com  Fact Sheet for Healthcare Providers: SeriousBroker.it  This test is not yet approved or cleared by the [place]  FDA and has been authorized for detection and/or diagnosis of SARS-CoV-2 by FDA under an Emergency Use Authorization (EUA). This EUA will remain in effect (meaning this test can be used) for the duration of the COVID-19 declaration under Section 564(b)(1) of the Act, 21 U.S.C. section 360bbb-3(b)(1), unless the authorization is terminated or revoked.     Resp Syncytial Virus by PCR NEGATIVE NEGATIVE Final    Comment: (NOTE) Fact Sheet for Patients: BloggerCourse.com  Fact Sheet for Healthcare Providers: SeriousBroker.it  This test is not yet approved or cleared by the [place]  FDA and has been authorized for detection and/or diagnosis of SARS-CoV-2 by FDA under an Emergency Use Authorization (EUA). This EUA will remain in effect (meaning this test can be used) for the duration of the COVID-19 declaration under Section 564(b)(1) of the Act, 21 U.S.C. section 360bbb-3(b)(1), unless the authorization is terminated or revoked.  Performed at [facility], [address] [place], [place] [address]   Culture, blood (x 2)     Status: None   Collection Time: 12/03/22  3:25 AM   Specimen: BLOOD  Result Value Ref Range Status   Specimen Description   Final    BLOOD BLOOD RIGHT HAND Performed at [facility], [address], [place], [place] [address]    Special  Requests   Final    BOTTLES DRAWN AEROBIC AND ANAEROBIC Blood Culture results may not be optimal due to an excessive volume of blood received in culture bottles Performed at [facility], [address], [place], [place] [address]    Culture   Final    NO GROWTH 5 DAYS Performed at [facility], [address], [place], [place] [address]    Report Status 12/08/2022 FINAL  Final  Culture, blood (x 2)     Status: None   Collection Time: 12/03/22  4:15 AM   Specimen: BLOOD  Result Value Ref Range Status   Specimen Description   Final    BLOOD BLOOD RIGHT HAND Performed at [facility], [address] [place], [place] [address]    Special Requests   Final    BOTTLES DRAWN AEROBIC AND ANAEROBIC Blood Culture results may not be optimal due to an excessive volume of blood received in culture bottles Performed at [facility], [address], [place], [place] [address]    Culture   Final    NO GROWTH 5 DAYS Performed at [facility], [address], [place], [place] [address]    Report Status 12/08/2022 FINAL  Final    Labs: CBC: Recent Labs  Lab 08/16/24 2239 08/17/24 0154 08/17/24 0706 08/18/24 0645  WBC 8.3 8.6 7.7 7.0  NEUTROABS 3.9  --   --  2.5  HGB 11.5* 11.4* 10.8* 11.9*  HCT 36.7* 36.3* 34.6* 41.3  MCV 80.7 80.3 80.3 88.6  PLT 272 259 275 166   Basic Metabolic Panel: Recent Labs  Lab 08/16/24 2239 08/17/24 0154 08/17/24 0706 08/17/24 1443 08/18/24 1405  NA 134*  --  134*  --  135  K 2.4*  --  2.9* 3.3* 4.4  CL 95*  --  100  --  102  CO2 26  --  25  --  24  GLUCOSE 109*  --  100*  --  101*  BUN 15  --  12  --  7  CREATININE 0.71 0.66 0.80  --  0.65  CALCIUM  8.2*  --  8.3*  --  8.6*  MG  --   --  1.8  --   --    Liver Function Tests: Recent Labs  Lab 08/16/24 2239  AST 13*  ALT 11  ALKPHOS 77  BILITOT 0.6  PROT 6.7  ALBUMIN 3.3*   CBG: No results for input(s): GLUCAP in the  last 168 hours.  Discharge time spent: 37 minutes.   Signed: Elgie Butter, MD [facility] 08/20/2024

## 2024-08-24 ENCOUNTER — Other Ambulatory Visit: Payer: Self-pay | Admitting: Urology

## 2024-08-24 DIAGNOSIS — N401 Enlarged prostate with lower urinary tract symptoms: Secondary | ICD-10-CM

## 2025-01-17 ENCOUNTER — Other Ambulatory Visit: Payer: Self-pay

## 2025-01-17 ENCOUNTER — Emergency Department
Admission: EM | Admit: 2025-01-17 | Discharge: 2025-01-17 | Disposition: A | Discharge status: Home / Self Care | Attending: Emergency Medicine | Admitting: Emergency Medicine

## 2025-01-17 DIAGNOSIS — M549 Dorsalgia, unspecified: Secondary | ICD-10-CM | POA: Diagnosis present

## 2025-01-17 DIAGNOSIS — I10 Essential (primary) hypertension: Secondary | ICD-10-CM | POA: Insufficient documentation

## 2025-01-17 DIAGNOSIS — G8929 Other chronic pain: Secondary | ICD-10-CM | POA: Diagnosis not present

## 2025-01-17 DIAGNOSIS — M545 Low back pain, unspecified: Secondary | ICD-10-CM | POA: Insufficient documentation

## 2025-01-17 MED ORDER — MORPHINE SULFATE (PF) 2 MG/ML IV SOLN: 2.0000 mg | INTRAVENOUS | Status: DC | PRN

## 2025-01-17 MED ORDER — MORPHINE SULFATE (PF) 2 MG/ML IV SOLN: 2.0000 mg | INTRAVENOUS | Status: DC

## 2025-01-17 MED ORDER — MORPHINE SULFATE (PF) 2 MG/ML IV SOLN
2.0000 mg | Freq: Once | INTRAVENOUS | Status: AC
  Administered 2025-01-17: 2 mg via INTRAMUSCULAR
  Filled 2025-01-17: qty 1

## 2025-01-17 MED ORDER — OXYCODONE-ACETAMINOPHEN 5-325 MG PO TABS: 1.0000 | ORAL_TABLET | Freq: Once | ORAL | Status: DC

## 2025-01-17 NOTE — Discharge Instructions (Signed)
 Take your home meds as directed.  Follow-up with your primary provider for ongoing evaluation.
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
## 2025-01-17 NOTE — ED Notes (Signed)
 Patient was able to hold his weight and shuffle around to sit on the bed with two full assist. Patient is not steady and cannot remain in an upright position.
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
## 2025-01-18 ENCOUNTER — Observation Stay: Admission: EM | Admit: 2025-01-18 | Discharge: 2025-01-21 | Disposition: A | Discharge status: Skilled Nursing Facility

## 2025-01-18 DIAGNOSIS — I10 Essential (primary) hypertension: Secondary | ICD-10-CM | POA: Diagnosis present

## 2025-01-18 DIAGNOSIS — G4733 Obstructive sleep apnea (adult) (pediatric): Secondary | ICD-10-CM

## 2025-01-18 DIAGNOSIS — F17210 Nicotine dependence, cigarettes, uncomplicated: Secondary | ICD-10-CM | POA: Insufficient documentation

## 2025-01-18 DIAGNOSIS — E785 Hyperlipidemia, unspecified: Secondary | ICD-10-CM | POA: Diagnosis present

## 2025-01-18 DIAGNOSIS — D709 Neutropenia, unspecified: Secondary | ICD-10-CM | POA: Diagnosis present

## 2025-01-18 DIAGNOSIS — N401 Enlarged prostate with lower urinary tract symptoms: Secondary | ICD-10-CM

## 2025-01-18 DIAGNOSIS — Z79899 Other long term (current) drug therapy: Secondary | ICD-10-CM | POA: Diagnosis not present

## 2025-01-18 DIAGNOSIS — Z6829 Body mass index (BMI) 29.0-29.9, adult: Secondary | ICD-10-CM | POA: Diagnosis not present

## 2025-01-18 DIAGNOSIS — F1590 Other stimulant use, unspecified, uncomplicated: Secondary | ICD-10-CM | POA: Diagnosis not present

## 2025-01-18 DIAGNOSIS — G894 Chronic pain syndrome: Secondary | ICD-10-CM | POA: Insufficient documentation

## 2025-01-18 DIAGNOSIS — F17220 Nicotine dependence, chewing tobacco, uncomplicated: Secondary | ICD-10-CM | POA: Insufficient documentation

## 2025-01-18 DIAGNOSIS — I69354 Hemiplegia and hemiparesis following cerebral infarction affecting left non-dominant side: Secondary | ICD-10-CM | POA: Insufficient documentation

## 2025-01-18 DIAGNOSIS — I959 Hypotension, unspecified: Principal | ICD-10-CM | POA: Diagnosis present

## 2025-01-18 DIAGNOSIS — F418 Other specified anxiety disorders: Secondary | ICD-10-CM | POA: Diagnosis present

## 2025-01-18 DIAGNOSIS — E663 Overweight: Secondary | ICD-10-CM | POA: Diagnosis not present

## 2025-01-18 DIAGNOSIS — N400 Enlarged prostate without lower urinary tract symptoms: Secondary | ICD-10-CM | POA: Insufficient documentation

## 2025-01-18 DIAGNOSIS — G8929 Other chronic pain: Secondary | ICD-10-CM | POA: Diagnosis present

## 2025-01-18 DIAGNOSIS — Z8673 Personal history of transient ischemic attack (TIA), and cerebral infarction without residual deficits: Secondary | ICD-10-CM | POA: Insufficient documentation

## 2025-01-18 DIAGNOSIS — F319 Bipolar disorder, unspecified: Secondary | ICD-10-CM | POA: Diagnosis not present

## 2025-01-18 MED ORDER — TAMSULOSIN HCL 0.4 MG PO CAPS
0.4000 mg | ORAL_CAPSULE | Freq: Every day | ORAL | Status: DC
  Administered 2025-01-19: 0.4 mg via ORAL
  Filled 2025-01-18: qty 1

## 2025-01-18 MED ORDER — DIPHENHYDRAMINE HCL 25 MG PO CAPS
50.0000 mg | ORAL_CAPSULE | Freq: Once | ORAL | Status: AC
  Administered 2025-01-18: 50 mg via ORAL
  Filled 2025-01-18 (×2): qty 2

## 2025-01-18 MED ORDER — LOSARTAN POTASSIUM 50 MG PO TABS
100.0000 mg | ORAL_TABLET | Freq: Every day | ORAL | Status: DC
  Administered 2025-01-19: 100 mg via ORAL
  Filled 2025-01-18: qty 2

## 2025-01-18 MED ORDER — ARIPIPRAZOLE 10 MG PO TABS
5.0000 mg | ORAL_TABLET | Freq: Every day | ORAL | Status: DC
  Administered 2025-01-19 – 2025-01-21 (×3): 5 mg via ORAL
  Filled 2025-01-18 (×3): qty 1

## 2025-01-18 MED ORDER — ATORVASTATIN CALCIUM 20 MG PO TABS
40.0000 mg | ORAL_TABLET | Freq: Every day | ORAL | Status: DC
  Administered 2025-01-19 – 2025-01-20 (×3): 40 mg via ORAL
  Filled 2025-01-18 (×3): qty 2

## 2025-01-18 MED ORDER — AMITRIPTYLINE HCL 50 MG PO TABS
25.0000 mg | ORAL_TABLET | Freq: Every day | ORAL | Status: DC
  Administered 2025-01-19 – 2025-01-20 (×3): 25 mg via ORAL
  Filled 2025-01-18 (×4): qty 1

## 2025-01-18 MED ORDER — LAMOTRIGINE 100 MG PO TABS
100.0000 mg | ORAL_TABLET | Freq: Two times a day (BID) | ORAL | Status: DC
  Administered 2025-01-19 – 2025-01-21 (×6): 100 mg via ORAL
  Filled 2025-01-18 (×6): qty 1

## 2025-01-18 MED ORDER — QUETIAPINE FUMARATE 25 MG PO TABS
50.0000 mg | ORAL_TABLET | Freq: Every day | ORAL | Status: DC
  Administered 2025-01-18 – 2025-01-20 (×4): 50 mg via ORAL
  Filled 2025-01-18 (×4): qty 2

## 2025-01-18 MED ORDER — AMLODIPINE BESYLATE 5 MG PO TABS: 2.5000 mg | ORAL_TABLET | Freq: Every day | ORAL | Status: DC

## 2025-01-18 MED ORDER — DARIDOREXANT HCL 25 MG PO TABS: 1.0000 | ORAL_TABLET | Freq: Every day | ORAL | Status: DC

## 2025-01-18 MED ORDER — MELATONIN 5 MG PO TABS
5.0000 mg | ORAL_TABLET | Freq: Every day | ORAL | Status: DC
  Administered 2025-01-18 – 2025-01-20 (×3): 5 mg via ORAL
  Filled 2025-01-18 (×3): qty 1

## 2025-01-18 MED ORDER — SERTRALINE HCL 50 MG PO TABS
150.0000 mg | ORAL_TABLET | Freq: Every day | ORAL | Status: DC
  Administered 2025-01-18 – 2025-01-20 (×4): 150 mg via ORAL
  Filled 2025-01-18 (×4): qty 3

## 2025-01-18 MED ORDER — FENTANYL 25 MCG/HR TD PT72
1.0000 | MEDICATED_PATCH | TRANSDERMAL | Status: DC
  Administered 2025-01-18 – 2025-01-21 (×2): 1 via TRANSDERMAL
  Filled 2025-01-18 (×2): qty 1

## 2025-01-18 MED ORDER — ACETAMINOPHEN 500 MG PO TABS: 1000.0000 mg | ORAL_TABLET | Freq: Three times a day (TID) | ORAL | Status: DC | PRN

## 2025-01-18 MED ORDER — PREGABALIN 75 MG PO CAPS
300.0000 mg | ORAL_CAPSULE | Freq: Two times a day (BID) | ORAL | Status: DC
  Administered 2025-01-18 – 2025-01-21 (×8): 300 mg via ORAL
  Filled 2025-01-18 (×3): qty 4
  Filled 2025-01-18 (×2): qty 6
  Filled 2025-01-18 (×3): qty 4

## 2025-01-18 MED ORDER — ESCITALOPRAM OXALATE 10 MG PO TABS
20.0000 mg | ORAL_TABLET | Freq: Every day | ORAL | Status: DC
  Administered 2025-01-19 – 2025-01-20 (×3): 20 mg via ORAL
  Filled 2025-01-18 (×3): qty 2

## 2025-01-18 MED ORDER — PANTOPRAZOLE SODIUM 40 MG PO TBEC
40.0000 mg | DELAYED_RELEASE_TABLET | Freq: Every day | ORAL | Status: DC
  Administered 2025-01-20 – 2025-01-21 (×2): 40 mg via ORAL
  Filled 2025-01-18 (×2): qty 1

## 2025-01-18 MED ORDER — NICOTINE 21 MG/24HR TD PT24
21.0000 mg | MEDICATED_PATCH | Freq: Once | TRANSDERMAL | Status: AC
  Administered 2025-01-18: 21 mg via TRANSDERMAL
  Filled 2025-01-18: qty 1

## 2025-01-18 MED ORDER — CHLORTHALIDONE 25 MG PO TABS
25.0000 mg | ORAL_TABLET | Freq: Every day | ORAL | Status: DC
  Administered 2025-01-19: 25 mg via ORAL
  Filled 2025-01-18: qty 1

## 2025-01-18 MED ORDER — ESCITALOPRAM OXALATE 10 MG PO TABS
20.0000 mg | ORAL_TABLET | Freq: Every day | ORAL | Status: DC
  Administered 2025-01-19 – 2025-01-21 (×3): 20 mg via ORAL
  Filled 2025-01-18 (×3): qty 2

## 2025-01-18 MED ORDER — CLOPIDOGREL BISULFATE 75 MG PO TABS
75.0000 mg | ORAL_TABLET | Freq: Every day | ORAL | Status: DC
  Administered 2025-01-19 – 2025-01-20 (×3): 75 mg via ORAL
  Filled 2025-01-18 (×3): qty 1

## 2025-01-18 MED ORDER — MIRTAZAPINE 15 MG PO TABS
15.0000 mg | ORAL_TABLET | Freq: Every day | ORAL | Status: DC
  Administered 2025-01-19 – 2025-01-20 (×3): 15 mg via ORAL
  Filled 2025-01-18 (×3): qty 1
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
## 2025-01-18 NOTE — Evaluation (Signed)
 Physical Therapy Evaluation Patient Details Name: Mark Wells MRN: [id number] DOB: Mar 30, 1971 Today's Date: 01/18/2025  History of Present Illness  Pt admitted to [facility] on 01/18/25 for placement, recently signing out AMA. Significant PMH includes: CVA with left-sided hemiplegia, HTN, anemia, mood disorder, and chronic pain.   Clinical Impression  Pt received in supine and is agreeable for PT eval. Since being at short term rehab, pt has required +1 assist for bed mobility and transfers to/from his [facility]; he is able to propel himself backwards with use of LE. He requires +1 assist for ADL's, IADL's, and medication management.   Pt presents with abnormal tone, generalized weakness, impaired fine/gross motor coordination, decreased activity tolerance, decreased gross balance, and increased pain levels, resulting in impaired functional mobility from baseline. Due to deficits, pt required mod-maxA for bed mobility and transfers to/from chair. Pt currently at increased risk of falls due to deficits listed above.  Deficits limit the pt's ability to safely and independently perform ADL's, transfer, and ambulate. Pt will benefit from acute skilled PT services to address deficits for return to baseline function. Pt will benefit from post acute therapy services to address deficits for return to baseline function.  Encourage OOB mobility with nursing and mobility tech for meals and toileting for continued progress towards goals and maintenance of IND with functional mobility while hospitalized.          If plan is discharge home, recommend the following: A lot of help with walking and/or transfers;A lot of help with bathing/dressing/bathroom;Help with stairs or ramp for entrance;Assist for transportation;Assistance with cooking/housework   Can travel by private vehicle    Yes    Equipment Recommendations  (defer to post acute)     Functional Status Assessment Patient has had a recent decline in their  functional status and demonstrates the ability to make significant improvements in function in a reasonable and predictable amount of time.     Precautions / Restrictions Precautions Precautions: Fall      Mobility  Bed Mobility               General bed mobility comments: maxA to sit EOB, HOB slightly elevated, use of BUE for support. modA for BLE facilitation to lie supine.    Transfers                   General transfer comment: initially modA for STS and SPT from EOB>chair without AD, unilateral HHA. regressing to maxA for STS and SPT chair>EOB with unilateral HHA.       Balance Overall balance assessment: Needs assistance   Sitting balance-Leahy Scale: Good       Standing balance-Leahy Scale: Poor Standing balance comment: increased posterior bias in standing requiring assistance for correction                             Pertinent Vitals/Pain Pain Assessment Pain Assessment: 0-10 Pain Score: 10-Worst pain ever Pain Location: L hip Pain Descriptors / Indicators: Aching Pain Intervention(s): Limited activity within patient's tolerance, Monitored during session, Repositioned    Home Living Family/patient expects to be discharged to:: Skilled nursing facility                   Additional Comments: Pt has been recieving therapy services.    Prior Function               Mobility Comments: +1 assist for OOB mobility and transfers  to/from [facility], propels self backwards in [facility] ADLs Comments: +1 assist for ADL's/IADL's     Extremity/Trunk Assessment   Upper Extremity Assessment Upper Extremity Assessment: Generalized weakness (spasticity and hypertonia BUE (L>R), at least 3+/5, impaired fine motor coordination)    Lower Extremity Assessment Lower Extremity Assessment: Generalized weakness (spasticity in RLE, grossly 3+/5, impaired motor coordination)       Communication   Communication Communication: No apparent  difficulties    Cognition Arousal: Alert Behavior During Therapy: [facility] for tasks assessed/performed                                              Exercises Other Exercises Other Exercises: Pt edu re: PT role/POC, DC recommendations, safety with mobility. he verbalized understanding.   Assessment/Plan    PT Assessment Patient needs continued PT services  PT Problem List Decreased strength;Decreased activity tolerance;Decreased balance;Decreased mobility;Decreased coordination;Decreased safety awareness;Pain       PT Treatment Interventions DME instruction;Gait training;Functional mobility training;Therapeutic activities;Therapeutic exercise;Balance training;Neuromuscular re-education;Patient/family education;Wheelchair mobility training    PT Goals (Current goals can be found in the Care Plan section)  Acute Rehab PT Goals Patient Stated Goal: go back to STR PT Goal Formulation: With patient Time For Goal Achievement: 02/01/25 Potential to Achieve Goals: Good    Frequency Min 2X/week        AM-PAC PT 6 Clicks Mobility  Outcome Measure Help needed turning from your back to your side while in a flat bed without using bedrails?: A Lot Help needed moving from lying on your back to sitting on the side of a flat bed without using bedrails?: A Lot Help needed moving to and from a bed to a chair (including a wheelchair)?: A Lot Help needed standing up from a chair using your arms (e.g., wheelchair or bedside chair)?: A Lot Help needed to walk in hospital room?: Total Help needed climbing 3-5 steps with a railing? : Total 6 Click Score: 10    End of Session Equipment Utilized During Treatment: Gait belt Activity Tolerance: Patient tolerated treatment well Patient left: in bed (bil railing up on ED stretcher bed, bed next to RN station) Nurse Communication: Mobility status PT Visit Diagnosis: Unsteadiness on feet (R26.81);Muscle weakness (generalized)  (M62.81);Hemiplegia and hemiparesis Hemiplegia - Right/Left: Left    Time: 8692-8667 PT Time Calculation (min) (ACUTE ONLY): 25 min   Charges:   PT Evaluation $PT Eval Moderate Complexity: 1 Mod PT Treatments $Therapeutic Activity: 8-22 mins PT General Charges $$ ACUTE PT VISIT: 1 Visit        Camie CHARLENA Kluver, PT, DPT 1:48 PM,01/18/25 Physical Therapist - [facility]

## 2025-01-18 NOTE — ED Notes (Signed)
 Spoke to Loraine at [facility] regarding pt's placement status at that facility. Loraine notes that pt had been demanding to take narcotics for pain, but was denied due to hx of Rx abuse. Pt signed AMA paperwork prior to previous visit at [facility]. At this time, pt is not to return to facility; unsure when eligible. MD notified.
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
## 2025-01-18 NOTE — Evaluation (Signed)
 Occupational Therapy Evaluation Patient Details Name: Mark Wells MRN: [id number] DOB: 01/06/1971 Today's Date: 01/18/2025   History of Present Illness   Pt admitted to [facility] on 01/18/25 for placement, recently signing out AMA. Significant PMH includes: CVA with left-sided hemiplegia, HTN, anemia, mood disorder, and chronic pain.     Clinical Impressions Patient presenting with decreased Ind in self care,balance, functional mobility, transfers, endurance, and safety awareness. Patient reports being wheelchair level at baseline with minimal assist to transfer. Pt needs some assistance for self care and IADLs at baseline. Wife is unable to physically care for him. Pt recently at rehab and would like to return for therapy. Pt does endorse fatigue when I arrive and is able to roll L <> R with mod- max A for therapist to assist with clothing and linen management. He decline attempts to sit on EOB and transfer. Patient will benefit from acute OT to increase overall independence in the areas of ADLs, functional mobility, and safety awareness in order to safely discharge.     If plan is discharge home, recommend the following:   A lot of help with walking and/or transfers;A lot of help with bathing/dressing/bathroom;Assistance with cooking/housework;Assist for transportation;Help with stairs or ramp for entrance     Functional Status Assessment   Patient has had a recent decline in their functional status and demonstrates the ability to make significant improvements in function in a reasonable and predictable amount of time.     Equipment Recommendations   Other (comment) (defer)      Precautions/Restrictions   Precautions Precautions: Fall     Mobility Bed Mobility Overal bed mobility: Needs Assistance Bed Mobility: Rolling Rolling: Max assist              Transfers                   General transfer comment: Pt refusing          ADL either performed or  assessed with clinical judgement   ADL Overall ADL's : Needs assistance/impaired                     Lower Body Dressing: Maximal assistance;Bed level                       Vision Patient Visual Report: No change from baseline              Pertinent Vitals/Pain Pain Assessment Pain Assessment: 0-10 Pain Score: 6  Pain Location: L hip Pain Descriptors / Indicators: Aching, Discomfort Pain Intervention(s): Limited activity within patient's tolerance, Monitored during session, Repositioned     Extremity/Trunk Assessment Upper Extremity Assessment Upper Extremity Assessment: Generalized weakness   Lower Extremity Assessment Lower Extremity Assessment: Generalized weakness       Communication Communication Communication: No apparent difficulties   Cognition Arousal: Alert Behavior During Therapy: WFL for tasks assessed/performed Cognition: No family/caregiver present to determine baseline             OT - Cognition Comments: slow processing but oriented                 Following commands: Impaired Following commands impaired: Follows one step commands with increased time                Home Living Family/patient expects to be discharged to:: Skilled nursing facility  Additional Comments: Pt has been recieving therapy services.      Prior Functioning/Environment Prior Level of Function : Patient poor historian/Family not available;Needs assist             Mobility Comments: +1 assist for OOB mobility and transfers to/from [facility], propels self backwards in [facility] ADLs Comments: +1 assist for ADL's/IADL's    OT Problem List: Decreased strength;Decreased activity tolerance;Impaired balance (sitting and/or standing);Decreased safety awareness   OT Treatment/Interventions: Self-care/ADL training;Balance training;Therapeutic exercise;Neuromuscular education;Therapeutic activities;Energy  conservation;Patient/family education      OT Goals(Current goals can be found in the care plan section)   Acute Rehab OT Goals Patient Stated Goal: to get stronger OT Goal Formulation: With patient Time For Goal Achievement: 02/01/25 Potential to Achieve Goals: Fair ADL Goals Pt Will Perform Grooming: sitting;with supervision Pt Will Perform Lower Body Dressing: with min assist;sitting/lateral leans Pt Will Transfer to Toilet: with min assist;squat pivot transfer Pt Will Perform Toileting - Clothing Manipulation and hygiene: sitting/lateral leans   OT Frequency:  Min 2X/week       AM-PAC OT 6 Clicks Daily Activity     Outcome Measure Help from another person eating meals?: A Little Help from another person taking care of personal grooming?: A Little Help from another person toileting, which includes using toliet, bedpan, or urinal?: A Lot Help from another person bathing (including washing, rinsing, drying)?: A Lot Help from another person to put on and taking off regular upper body clothing?: A Little Help from another person to put on and taking off regular lower body clothing?: A Lot 6 Click Score: 15   End of Session    Activity Tolerance: Patient limited by fatigue Patient left: in bed;with call bell/phone within reach  OT Visit Diagnosis: Unsteadiness on feet (R26.81);Muscle weakness (generalized) (M62.81);Repeated falls (R29.6)                Time: 8489-8475 OT Time Calculation (min): 14 min Charges:  OT General Charges $OT Visit: 1 Visit OT Evaluation $OT Eval Moderate Complexity: 1 [address] [place], OTR/L , CBIS ascom [phone]  01/18/25, 3:40 PM

## 2025-01-18 NOTE — NC FL2 (Signed)
 " [place]  MEDICAID FL2 LEVEL OF CARE FORM     IDENTIFICATION  Patient Name: Mark Wells Birthdate: August 12, 1971 Sex: male Admission Date (Current Location): 01/18/2025  [facility] and [place] Number:  Chiropodist and Address:  [facility], [address] [place], [place] [address]      Provider Number: [id number]  Attending Physician Name and Address:  Rexford Reche HERO, MD  Relative Name and Phone Number:  corin, tilly, Emergency Contact  [phone] (Mobile    Current Level of Care: Hospital Recommended Level of Care: Skilled Nursing Facility Prior Approval Number:    Date Approved/Denied:   PASRR Number: [id number]  Discharge Plan: SNF    Current Diagnoses: Patient Active Problem List   Diagnosis Date Noted   Hypokalemia 08/16/2024   Confusion 12/03/2022   Fever 12/03/2022   Obesity (BMI 30-39.9) 12/03/2022   History of CVA (cerebrovascular accident) 12/03/2022   Priapism, drug-induced 12/02/2022   OSA on CPAP 12/09/2018   Major depression, recurrent, full remission 12/09/2018   Insomnia 12/09/2018   Spastic hemiplegia of left dominant side as late effect of cerebral infarction (HCC) 12/07/2018   Painful orthopaedic hardware 09/03/2018   Spastic hemiplegia affecting nondominant side (HCC) 01/24/2018   Chronic, continuous use of opioids 03/25/2014   Depression 03/25/2014   Neuropathic pain 01/28/2014   Low back pain 04/16/2013   Hernia, incisional 04/16/2013   Backache 01/07/2011   ADVERSE DRUG REACTION 01/01/2011   Hypertension 12/28/2010   Chronic pain 12/28/2010   OTHER URINARY INCONTINENCE 12/28/2010    Orientation RESPIRATION BLADDER Height & Weight     Self, Time, Situation, Place  Normal Continent Weight: 180 lb 12.4 oz (82 kg) Height:  5' 6 (167.6 cm)  BEHAVIORAL SYMPTOMS/MOOD NEUROLOGICAL BOWEL NUTRITION STATUS   (He isnt violent but can be irrational at times.)   Continent Diet   AMBULATORY STATUS COMMUNICATION OF NEEDS Skin   Extensive Assist Verbally Normal                       Personal Care Assistance Level of Assistance  Bathing, Feeding, Dressing Bathing Assistance: Limited assistance Feeding assistance: Independent Dressing Assistance: Limited assistance     Functional Limitations Info             SPECIAL CARE FACTORS FREQUENCY                       Contractures Contractures Info: Not present    Additional Factors Info  Allergies   Allergies Info: Aspirin Not Specified  Other (See Comments) Has Gastric Ulcer Other reaction(s): jittery  Ibuprofen Not Specified  Other (See Comments) Ulcers- bleeding Other reaction(s): rectal bleeding  Shellfish Allergy Not Specified  Swelling Other reaction(s): SOB  Belsomra (suvorexant) Low Unspecified Other (See Comments) Reaction not listed on MAR  Iodine Low Intolerance Swelling   Lunesta (eszopiclone) Low Unspecified Other (See Comments)           Current Medications (01/18/2025):  This is the current hospital active medication list Current Facility-Administered Medications  Medication Dose Route Frequency Provider Last Rate Last Admin   fentaNYL  (DURAGESIC ) 25 MCG/HR 1 patch  1 patch Transdermal Q72H Gordan Huxley, MD   1 patch at 01/18/25 0243   nicotine  (NICODERM CQ  - dosed in mg/24 hours) patch 21 mg  21 mg Transdermal Once Mulvihill, Caitlin M, MD   21 mg at 01/18/25 1036   pregabalin  (LYRICA ) capsule 300 mg  300 mg  Oral BID Gordan Huxley, MD   300 mg at 01/18/25 1015   QUEtiapine  (SEROQUEL ) tablet 50 mg  50 mg Oral QHS Gordan Huxley, MD   50 mg at 01/18/25 0308   sertraline  (ZOLOFT ) tablet 150 mg  150 mg Oral QHS Gordan Huxley, MD   150 mg at 01/18/25 9691   Current Outpatient Medications  Medication Sig Dispense Refill   amitriptyline  (ELAVIL ) 25 MG tablet Take 25 mg by mouth at bedtime.     amLODipine  (NORVASC ) 2.5 MG tablet Take 2.5 mg by mouth daily.     ARIPiprazole  (ABILIFY ) 5 MG  tablet Take 5 mg by mouth daily.     atorvastatin  (LIPITOR) 40 MG tablet TAKE 1 TABLET BY MOUTH ONCE DAILY AT BEDTIME FOR  CHOLESTEROL 90 tablet 0   chlorthalidone  (HYGROTON ) 25 MG tablet TAKE 1 TABLET BY MOUTH EVERY DAY (Patient taking differently: Take 25 mg by mouth at bedtime.) 90 tablet 0   clopidogrel  (PLAVIX ) 75 MG tablet TAKE 1 TABLET BY MOUTH ONCE DAILY. (Patient taking differently: Take 75 mg by mouth at bedtime.) 90 tablet 0   escitalopram  (LEXAPRO ) 20 MG tablet Take 20 mg by mouth daily.     LAMICTAL  100 MG tablet Take 100 mg by mouth 2 (two) times daily.     LEXAPRO  20 MG tablet Take 20 mg by mouth at bedtime.     losartan  (COZAAR ) 100 MG tablet TAKE 1 TABLET BY MOUTH  DAILY (Patient taking differently: Take 100 mg by mouth at bedtime.) 90 tablet 0   methocarbamol  (ROBAXIN ) 500 MG tablet TAKE (1) TABLET EVERY SIX HOURS AS NEEDED FOR MUSCLE SPASMS.    - MAY MAKE DROWSY - 120 tablet 0   mirtazapine  (REMERON ) 15 MG tablet Take 15 mg by mouth at bedtime.     Oxycodone  HCl 10 MG TABS Take 10 mg by mouth 4 (four) times daily as needed.     pantoprazole  (PROTONIX ) 40 MG tablet Take 1 tablet (40 mg total) by mouth daily at 6 (six) AM. 7 tablet 0   potassium chloride  (KLOR-CON ) 10 MEQ tablet TAKE 1 TABLET BY MOUTH TWICE  DAILY 180 tablet 0   pregabalin  (LYRICA ) 300 MG capsule Take 300 mg by mouth 2 (two) times daily.      QUVIVIQ  25 MG TABS Take 1 tablet by mouth daily.     sertraline  (ZOLOFT ) 100 MG tablet Take 150 mg by mouth at bedtime.     tamsulosin  (FLOMAX ) 0.4 MG CAPS capsule TAKE 1 CAPSULE BY MOUTH ONCE  DAILY (Patient taking differently: Take 0.4 mg by mouth at bedtime.) 100 capsule 2   tiZANidine (ZANAFLEX) 4 MG tablet Take 4 mg by mouth 3 (three) times daily.     zolpidem  (AMBIEN ) 5 MG tablet Take 5 mg by mouth at bedtime as needed for sleep.     amLODipine  (NORVASC ) 5 MG tablet TAKE 1 TABLET BY MOUTH ONCE DAILY. (Patient not taking: Reported on 01/18/2025) 30 tablet 0   fentaNYL   (DURAGESIC ) 25 MCG/HR Place 1 patch onto the skin every 3 (three) days.     lamoTRIgine  (LAMICTAL ) 25 MG tablet Take 25 mg by mouth 2 (two) times daily. (Patient not taking: Reported on 01/18/2025)     QUEtiapine  (SEROQUEL ) 50 MG tablet Take 1 tablet by mouth at bedtime.       Discharge Medications: Please see discharge summary for a list of discharge medications.  Relevant Imaging Results:  Relevant Lab Results:   Additional Information [id number]  Dquan Cortopassi L  Evertt, LCSW     "

## 2025-01-18 NOTE — TOC Progression Note (Signed)
 Transition of Care [facility]) - Progression Note    Patient Details  Name: Nohlan Burdin MRN: [id number] Date of Birth: 09/06/1971  [facility] CM/SW Contact  Assia Meanor L Yurianna Tusing, [place] Phone Number: 01/18/2025, 2:17 PM  Clinical Narrative:     CSW and CSW Cordella, met with patient. No family at bedside. Patient was advised of several facilities offering a bed. Patient chose bed at [facility] in [place], [place].   Bed offer accepted. Auth initiated. Ref# [id number]                     Expected Discharge Plan and Services                                               Social Drivers of Health (SDOH) Interventions SDOH Screenings   Food Insecurity: No Food Insecurity (08/17/2024)  Housing: Low Risk (08/17/2024)  Transportation Needs: No Transportation Needs (08/17/2024)  Utilities: Not At Risk (08/17/2024)  Financial Resource Strain: Low Risk  (04/17/2024)   Received from [facility]  Tobacco Use: High Risk (01/18/2025)    Readmission Risk Interventions     No data to display

## 2025-01-19 ENCOUNTER — Emergency Department

## 2025-01-19 ENCOUNTER — Encounter: Payer: Self-pay | Admitting: Internal Medicine

## 2025-01-19 DIAGNOSIS — I959 Hypotension, unspecified: Secondary | ICD-10-CM | POA: Diagnosis present

## 2025-01-19 DIAGNOSIS — E663 Overweight: Secondary | ICD-10-CM | POA: Diagnosis present

## 2025-01-19 DIAGNOSIS — F418 Other specified anxiety disorders: Secondary | ICD-10-CM | POA: Diagnosis present

## 2025-01-19 DIAGNOSIS — I10 Essential (primary) hypertension: Secondary | ICD-10-CM | POA: Diagnosis not present

## 2025-01-19 DIAGNOSIS — E785 Hyperlipidemia, unspecified: Secondary | ICD-10-CM | POA: Diagnosis not present

## 2025-01-19 DIAGNOSIS — Z8673 Personal history of transient ischemic attack (TIA), and cerebral infarction without residual deficits: Secondary | ICD-10-CM

## 2025-01-19 DIAGNOSIS — G8929 Other chronic pain: Secondary | ICD-10-CM | POA: Diagnosis not present

## 2025-01-19 DIAGNOSIS — F319 Bipolar disorder, unspecified: Secondary | ICD-10-CM | POA: Diagnosis present

## 2025-01-19 DIAGNOSIS — D709 Neutropenia, unspecified: Secondary | ICD-10-CM | POA: Diagnosis present

## 2025-01-19 DIAGNOSIS — G4733 Obstructive sleep apnea (adult) (pediatric): Secondary | ICD-10-CM

## 2025-01-19 LAB — URINALYSIS, ROUTINE W REFLEX MICROSCOPIC
Bilirubin Urine: NEGATIVE
Glucose, UA: NEGATIVE mg/dL
Hgb urine dipstick: NEGATIVE
Ketones, ur: NEGATIVE mg/dL
Leukocytes,Ua: NEGATIVE
Nitrite: NEGATIVE
Protein, ur: NEGATIVE mg/dL
Specific Gravity, Urine: 1.011 (ref 1.005–1.030)
pH: 7 (ref 5.0–8.0)

## 2025-01-19 LAB — BASIC METABOLIC PANEL WITH GFR
Anion gap: 11 (ref 5–15)
BUN: 12 mg/dL (ref 6–20)
CO2: 26 mmol/L (ref 22–32)
Calcium: 9.1 mg/dL (ref 8.9–10.3)
Chloride: 101 mmol/L (ref 98–111)
Creatinine, Ser: 1.26 mg/dL — ABNORMAL HIGH (ref 0.61–1.24)
GFR, Estimated: >60 mL/min
Glucose, Bld: 102 mg/dL — ABNORMAL HIGH (ref 70–99)
Potassium: 4.6 mmol/L (ref 3.5–5.1)
Sodium: 138 mmol/L (ref 135–145)

## 2025-01-19 LAB — CBC WITH DIFFERENTIAL/PLATELET
Abs Immature Granulocytes: 0.01 10*3/uL (ref 0.00–0.07)
Basophils Absolute: 0 10*3/uL (ref 0.0–0.1)
Basophils Relative: 1 %
Eosinophils Absolute: 0.2 10*3/uL (ref 0.0–0.5)
Eosinophils Relative: 5 %
HCT: 39.7 % (ref 39.0–52.0)
Hemoglobin: 12.6 g/dL — ABNORMAL LOW (ref 13.0–17.0)
Immature Granulocytes: 0 %
Lymphocytes Relative: 54 %
Lymphs Abs: 2 10*3/uL (ref 0.7–4.0)
MCH: 25.7 pg — ABNORMAL LOW (ref 26.0–34.0)
MCHC: 31.7 g/dL (ref 30.0–36.0)
MCV: 80.9 fL (ref 80.0–100.0)
Monocytes Absolute: 0.4 10*3/uL (ref 0.1–1.0)
Monocytes Relative: 9 %
Neutro Abs: 1.2 10*3/uL — ABNORMAL LOW (ref 1.7–7.7)
Neutrophils Relative %: 31 %
Platelets: 228 10*3/uL (ref 150–400)
RBC: 4.91 MIL/uL (ref 4.22–5.81)
RDW: 16.4 % — ABNORMAL HIGH (ref 11.5–15.5)
WBC: 3.8 10*3/uL — ABNORMAL LOW (ref 4.0–10.5)
nRBC: 0 % (ref 0.0–0.2)

## 2025-01-19 LAB — PROCALCITONIN: Procalcitonin: <0.1 ng/mL

## 2025-01-19 LAB — LACTIC ACID, PLASMA: Lactic Acid, Venous: 1.4 mmol/L (ref 0.5–1.9)

## 2025-01-19 MED ORDER — HYDRALAZINE HCL 20 MG/ML IJ SOLN: 5.0000 mg | INTRAMUSCULAR | Status: DC | PRN

## 2025-01-19 MED ORDER — ACETAMINOPHEN 325 MG PO TABS: 650.0000 mg | ORAL_TABLET | Freq: Four times a day (QID) | ORAL | Status: DC | PRN

## 2025-01-19 MED ORDER — LACTATED RINGERS IV SOLN: INTRAVENOUS | Status: AC

## 2025-01-19 MED ORDER — ONDANSETRON HCL 4 MG/2ML IJ SOLN: 4.0000 mg | Freq: Three times a day (TID) | INTRAMUSCULAR | Status: DC | PRN

## 2025-01-19 MED ORDER — LACTATED RINGERS IV BOLUS
500.0000 mL | Freq: Once | INTRAVENOUS | Status: AC
  Administered 2025-01-19: 500 mL via INTRAVENOUS

## 2025-01-19 MED ORDER — MIDODRINE HCL 5 MG PO TABS
10.0000 mg | ORAL_TABLET | Freq: Three times a day (TID) | ORAL | Status: DC
  Administered 2025-01-20: 10 mg via ORAL
  Filled 2025-01-19 (×3): qty 2

## 2025-01-19 MED ORDER — OXYCODONE HCL 5 MG PO TABS: 5.0000 mg | ORAL_TABLET | Freq: Four times a day (QID) | ORAL | Status: DC | PRN

## 2025-01-19 MED ORDER — LACTATED RINGERS IV BOLUS
1000.0000 mL | Freq: Once | INTRAVENOUS | Status: AC
  Administered 2025-01-19: 1000 mL via INTRAVENOUS

## 2025-01-19 MED ORDER — MIDODRINE HCL 5 MG PO TABS
10.0000 mg | ORAL_TABLET | Freq: Once | ORAL | Status: AC
  Administered 2025-01-19: 10 mg via ORAL
  Filled 2025-01-19: qty 2

## 2025-01-19 MED ORDER — ENOXAPARIN SODIUM 40 MG/0.4ML IJ SOSY
40.0000 mg | PREFILLED_SYRINGE | INTRAMUSCULAR | Status: DC
  Administered 2025-01-20: 40 mg via SUBCUTANEOUS
  Filled 2025-01-19 (×2): qty 0.4

## 2025-01-19 MED ORDER — LACTATED RINGERS IV SOLN: INTRAVENOUS | Status: DC

## 2025-01-19 MED ORDER — LIDOCAINE 5 % EX PTCH
1.0000 | MEDICATED_PATCH | CUTANEOUS | Status: DC
  Filled 2025-01-19 (×2): qty 1

## 2025-01-19 NOTE — ED Provider Notes (Signed)
 Clinical Course as of 01/19/25 1752  Sat Jan 18, 2025  9570 Of note, I considered ordering PT and OT assessments, but this does not seem to be relevant given that the main issue is that he came from a SNF from which he signed himself out and additional PT and OT evaluations may be necessary but it is unclear to me why they are needed at this point [CF]  Sun Jan 19, 2025  0724 I was notified by nursing that patient's blood pressure had downtrended to 69/40.  This was rechecked on both arms separately and cuff was a good bit.  Patient is mentating well but does endorse generalized pain.  He is on a large amount of blood pressure medication.  However given how low this was, I made the decision made to insert an IV, administer an LR bolus, obtain basic blood work.  I did do a skin check of the patient and I do not see any signs of skin breakdown or infection.  He has no abdominal tenderness to palpation shortness of breath and he remains afebrile.  I have canceled his amlodipine  dose for 10:00.  Disposition pending response to fluids, blood work and reassessment [HD]  0843 Lactic Acid, Venous: 1.4 No elevation [HD]  0844 EKG and rhythm strip are interpreted by myself:   EKG: bradycardic sinus rhythm] at heart rate of 58, normal QRS duration, QTc 473, nonspecific ST segments and T waves no ectopy EKG not consistent with Acute STEMI Rhythm strip: bradycardic rhythm in lead II  [HD]  9076 Basic metabolic panel(!) Possibly a little dehydrated as his creatinine is trace its normal limit.  He is getting full fluids [HD]  [phone] Talked with pharmacy about medications and we will remove the chlorthalidone  as well [HD]  1028 Urinalysis, Routine w reflex microscopic -Urine, Clean Catch(!) No signs of infection.  Patient is medically cleared but will cut back on his blood pressure medication [HD]    Clinical Course User Index [CF] Gordan Huxley, MD [HD] Nicholaus Rolland BRAVO, MD   Patient's blood pressure did  improve after IV fluids.  We have decreased his blood pressure medication he will be signed out to oncoming physician at 7 PM   Nicholaus Rolland BRAVO, MD 01/19/25 1752

## 2025-01-19 NOTE — ED Notes (Signed)
 Assisted pt with lunch. He ate about 75% of his meal. Repositioned, updated VS, assured comfort. Will continue to monitor.

## 2025-01-19 NOTE — ED Provider Notes (Signed)
 7:00 PM  Assumed care at shift change.  Patient here as a boarder awaiting placement to a nursing facility.  Noted to become extremely hypotensive this morning.  Slowly improving with IV fluids.  His blood pressure medications have been held.  Reportedly no infectious symptoms.  Patient's hemoglobin is normal.  Creatinine is doubled from baseline to 1.26.  Normal lactic.  Urine shows no sign of infection and no ketonuria or elevated specific gravity to suggest dehydration.  Plan will be to discuss with medicine for admission for monitoring of hypotension, continued hydration.  7:26 PM  Consulted and discussed patient's case with hospitalist, Dr. Hilma.  I have recommended admission and consulting physician agrees and will place admission orders.  Patient (and family if present) agree with this plan.   I reviewed all nursing notes, vitals, pertinent previous records.  All labs, EKGs, imaging ordered have been independently reviewed and interpreted by myself.   Chest x-ray ordered to rule out infection.   Mark Wells, Josette SAILOR, DO 01/19/25 [phone]
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
## 2025-01-19 NOTE — ED Provider Notes (Signed)
----------------------------------------- °  4:48 AM on 01/19/2025 -----------------------------------------   Blood pressure 106/74, pulse 66, temperature 98.3 F (36.8 C), temperature source Oral, resp. rate 18, height 5' 6 (1.676 m), weight 82 kg, SpO2 99%.  The patient is calm and cooperative at this time.  There have been no acute events since the last update.  Awaiting disposition plan from case management/social work.    Heily Carlucci, Josette SAILOR, DO 01/19/25 [phone]

## 2025-01-19 NOTE — TOC Progression Note (Signed)
 Transition of Care [facility]) - Progression Note    Patient Details  Name: Mark Wells MRN: [id number] Date of Birth: [birth year]  Transition of Care [facility]) CM/SW Contact  Shima Compere L Asiya [place], [place] Phone Number: 01/19/2025, 9:40 AM  Clinical Narrative:        Insurance authorization received for SNF placement. [facility] notified of approval. It is not likely that patient can transport today given the current weather conditions.                  Expected Discharge Plan and Services                                               Social Drivers of Health (SDOH) Interventions SDOH Screenings   Food Insecurity: No Food Insecurity (08/17/2024)  Housing: Low Risk (08/17/2024)  Transportation Needs: No Transportation Needs (08/17/2024)  Utilities: Not At Risk (08/17/2024)  Financial Resource Strain: Low Risk  (04/17/2024)   Received from [facility]  Tobacco Use: High Risk (01/18/2025)    Readmission Risk Interventions     No data to display

## 2025-01-20 DIAGNOSIS — I959 Hypotension, unspecified: Secondary | ICD-10-CM | POA: Diagnosis not present

## 2025-01-20 LAB — BASIC METABOLIC PANEL WITH GFR
Anion gap: 10 (ref 5–15)
BUN: 16 mg/dL (ref 6–20)
CO2: 27 mmol/L (ref 22–32)
Calcium: 8.9 mg/dL (ref 8.9–10.3)
Chloride: 106 mmol/L (ref 98–111)
Creatinine, Ser: 0.8 mg/dL (ref 0.61–1.24)
GFR, Estimated: >60 mL/min
Glucose, Bld: 101 mg/dL — ABNORMAL HIGH (ref 70–99)
Potassium: 3.6 mmol/L (ref 3.5–5.1)
Sodium: 143 mmol/L (ref 135–145)

## 2025-01-20 LAB — CBC
HCT: 38.3 % — ABNORMAL LOW (ref 39.0–52.0)
Hemoglobin: 11.9 g/dL — ABNORMAL LOW (ref 13.0–17.0)
MCH: 25.5 pg — ABNORMAL LOW (ref 26.0–34.0)
MCHC: 31.1 g/dL (ref 30.0–36.0)
MCV: 82.2 fL (ref 80.0–100.0)
Platelets: 245 10*3/uL (ref 150–400)
RBC: 4.66 MIL/uL (ref 4.22–5.81)
RDW: 16.3 % — ABNORMAL HIGH (ref 11.5–15.5)
WBC: 4.6 10*3/uL (ref 4.0–10.5)
nRBC: 0 % (ref 0.0–0.2)

## 2025-01-20 MED ORDER — POTASSIUM CHLORIDE CRYS ER 20 MEQ PO TBCR
40.0000 meq | EXTENDED_RELEASE_TABLET | Freq: Once | ORAL | Status: AC
  Administered 2025-01-20: 40 meq via ORAL
  Filled 2025-01-20: qty 2

## 2025-01-20 MED ORDER — TAMSULOSIN HCL 0.4 MG PO CAPS
0.4000 mg | ORAL_CAPSULE | Freq: Every day | ORAL | Status: DC
  Administered 2025-01-20: 0.4 mg via ORAL
  Filled 2025-01-20: qty 1

## 2025-01-20 MED ORDER — NICOTINE 21 MG/24HR TD PT24
21.0000 mg | MEDICATED_PATCH | Freq: Every day | TRANSDERMAL | Status: DC
  Administered 2025-01-20 – 2025-01-21 (×2): 21 mg via TRANSDERMAL
  Filled 2025-01-20 (×2): qty 1

## 2025-01-20 MED ORDER — METHOCARBAMOL 500 MG PO TABS
500.0000 mg | ORAL_TABLET | Freq: Four times a day (QID) | ORAL | Status: DC | PRN
  Administered 2025-01-20: 500 mg via ORAL
  Filled 2025-01-20: qty 1

## 2025-01-20 MED ORDER — OXYCODONE HCL 5 MG PO TABS
10.0000 mg | ORAL_TABLET | Freq: Four times a day (QID) | ORAL | Status: DC | PRN
  Administered 2025-01-20 – 2025-01-21 (×5): 10 mg via ORAL
  Filled 2025-01-20 (×5): qty 2

## 2025-01-20 NOTE — Progress Notes (Signed)
 Physical Therapy Treatment Patient Details Name: Mark Wells MRN: [id number] DOB: 10/27/1971 Today's Date: 01/20/2025   History of Present Illness Pt admitted to [facility] on 01/18/25 for placement, recently signing out AMA. Significant PMH includes: CVA with left-sided hemiplegia, HTN, anemia, mood disorder, and chronic pain.    PT Comments  Patient agreeable for OOB to chair this session with mod encouragement from therapist. Environment set up to simulate home environment, patient preference--prefers to exit R side of bed, transfer towards R; completing with min assist +1.  Maintains flexed/crouched posture throughout transfer and requires R UE support at all times.  Patient requesting return to bed nearly immediately after transfer; encouraged to remain in chair, but agreeable to stay through lunch with encouragement from therapist.     If plan is discharge home, recommend the following: A lot of help with walking and/or transfers;A lot of help with bathing/dressing/bathroom;Help with stairs or ramp for entrance;Assist for transportation;Assistance with cooking/housework   Can travel by private vehicle        Equipment Recommendations       Recommendations for Other Services       Precautions / Restrictions Precautions Precautions: Fall Restrictions Weight Bearing Restrictions Per Provider Order: No     Mobility  Bed Mobility Overal bed mobility: Needs Assistance Bed Mobility: Supine to Sit     Supine to sit: Min assist, Mod assist     General bed mobility comments: assist for forward weight shift and truncal elevation    Transfers Overall transfer level: Needs assistance   Transfers: Bed to chair/wheelchair/BSC             General transfer comment: prefers transfer towards R, maintaining grasp on arm rest at all times; unable to achieve fully extended position, maintaining crouched posture throughout transfer    Ambulation/Gait               General  Gait Details: non-ambulatory at baseline   Stairs             Wheelchair Mobility     Tilt Bed    Modified Rankin (Stroke Patients Only)       Balance Overall balance assessment: Needs assistance Sitting-balance support: No upper extremity supported, Feet supported Sitting balance-Leahy Scale: Good                                      Communication Communication Communication: No apparent difficulties  Cognition Arousal: Alert Behavior During Therapy: WFL for tasks assessed/performed                               Following commands impaired: Follows one step commands with increased time    Cueing    Exercises      General Comments        Pertinent Vitals/Pain Pain Assessment Pain Assessment: No/denies pain    Home Living Family/patient expects to be discharged to:: Skilled nursing facility                        Prior Function            PT Goals (current goals can now be found in the care plan section) Acute Rehab PT Goals Patient Stated Goal: go back to STR PT Goal Formulation: With patient Time For Goal Achievement: 02/01/25 Potential to Achieve Goals: Good  Progress towards PT goals: Progressing toward goals    Frequency    Min 2X/week      PT Plan      Co-evaluation              AM-PAC PT 6 Clicks Mobility   Outcome Measure  Help needed turning from your back to your side while in a flat bed without using bedrails?: A Lot Help needed moving from lying on your back to sitting on the side of a flat bed without using bedrails?: A Lot Help needed moving to and from a bed to a chair (including a wheelchair)?: A Little Help needed standing up from a chair using your arms (e.g., wheelchair or bedside chair)?: A Little Help needed to walk in hospital room?: Total Help needed climbing 3-5 steps with a railing? : Total 6 Click Score: 12    End of Session   Activity Tolerance: Patient  tolerated treatment well Patient left: in chair;with call bell/phone within reach;with chair alarm set Nurse Communication: Mobility status PT Visit Diagnosis: Unsteadiness on feet (R26.81);Muscle weakness (generalized) (M62.81);Hemiplegia and hemiparesis Hemiplegia - Right/Left: Left Hemiplegia - dominant/non-dominant: Non-dominant     Time: 8843-8776 PT Time Calculation (min) (ACUTE ONLY): 27 min  Charges:    $Therapeutic Activity: 23-37 mins PT General Charges $$ ACUTE PT VISIT: 1 Visit                    Skiler Tye H. Delores, PT, DPT, NCS 01/20/25, 1:49 PM [phone]
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
## 2025-01-20 NOTE — Plan of Care (Signed)
  Problem: Clinical Measurements: Goal: Ability to maintain clinical measurements within normal limits will improve Outcome: Progressing Goal: Respiratory complications will improve Outcome: Progressing   Problem: Activity: Goal: Risk for activity intolerance will decrease Outcome: Progressing   Problem: Nutrition: Goal: Adequate nutrition will be maintained Outcome: Progressing   Problem: Pain Managment: Goal: General experience of comfort will improve and/or be controlled Outcome: Progressing

## 2025-01-20 NOTE — TOC Progression Note (Signed)
 Transition of Care [facility]) - Progression Note    Patient Details  Name: Mark Wells MRN: [id number] Date of Birth: 1971/02/14  Transition of Care [facility]) CM/SW Contact  Jaleen Finch L Jaida Basurto, [place] Phone Number: 01/20/2025, 9:07 AM  Clinical Narrative:      CSW attempted to follow-up with [facility] to determine if patient would be able to discharge to the facility and to available bed. CSW left a HIPAA compliant voicemail with Nyle Pouch, admissions liaison.                     Expected Discharge Plan and Services                                               Social Drivers of Health (SDOH) Interventions SDOH Screenings   Food Insecurity: No Food Insecurity (08/17/2024)  Housing: Low Risk (08/17/2024)  Transportation Needs: No Transportation Needs (08/17/2024)  Utilities: Not At Risk (08/17/2024)  Financial Resource Strain: Low Risk  (04/17/2024)   Received from [facility]  Tobacco Use: High Risk (01/18/2025)    Readmission Risk Interventions     No data to display
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
## 2025-01-20 NOTE — TOC Progression Note (Signed)
 Transition of Care [facility]) - Progression Note    Patient Details  Name: Mark Wells MRN: [id number] Date of Birth: 08-Feb-1971  Transition of Care [facility]) CM/SW Contact  Alfonso Rummer, LCSW Phone Number: 01/20/2025, 2:37 PM  Clinical Narrative:      KEN DELENA Rummer called [facility] located in [place] unable reach admission rep via phone.                    Expected Discharge Plan and Services                                               Social Drivers of Health (SDOH) Interventions SDOH Screenings   Food Insecurity: No Food Insecurity (01/20/2025)  Housing: Low Risk (01/20/2025)  Transportation Needs: No Transportation Needs (01/20/2025)  Utilities: Not At Risk (01/20/2025)  Financial Resource Strain: Low Risk  (04/17/2024)   Received from [facility]  Tobacco Use: High Risk (01/18/2025)    Readmission Risk Interventions     No data to display

## 2025-01-21 ENCOUNTER — Other Ambulatory Visit: Payer: Self-pay

## 2025-01-21 DIAGNOSIS — I959 Hypotension, unspecified: Secondary | ICD-10-CM | POA: Diagnosis not present

## 2025-01-21 LAB — BASIC METABOLIC PANEL WITH GFR
Anion gap: 8 (ref 5–15)
BUN: 12 mg/dL (ref 6–20)
CO2: 27 mmol/L (ref 22–32)
Calcium: 8.7 mg/dL — ABNORMAL LOW (ref 8.9–10.3)
Chloride: 106 mmol/L (ref 98–111)
Creatinine, Ser: 0.74 mg/dL (ref 0.61–1.24)
GFR, Estimated: >60 mL/min
Glucose, Bld: 92 mg/dL (ref 70–99)
Potassium: 3.7 mmol/L (ref 3.5–5.1)
Sodium: 141 mmol/L (ref 135–145)

## 2025-01-21 LAB — MAGNESIUM: Magnesium: 2.1 mg/dL (ref 1.7–2.4)

## 2025-01-21 MED ORDER — LIDOCAINE 5 % EX PTCH: 1.0000 | MEDICATED_PATCH | CUTANEOUS | Status: AC

## 2025-01-21 MED ORDER — OXYCODONE HCL 10 MG PO TABS: 10.0000 mg | ORAL_TABLET | Freq: Four times a day (QID) | ORAL | 0 refills | Status: AC | PRN

## 2025-01-21 MED ORDER — FENTANYL 25 MCG/HR TD PT72: 1.0000 | MEDICATED_PATCH | TRANSDERMAL | 0 refills | Status: AC

## 2025-01-21 MED ORDER — TAMSULOSIN HCL 0.4 MG PO CAPS: 0.4000 mg | ORAL_CAPSULE | Freq: Every day | ORAL | Status: AC

## 2025-01-21 MED ORDER — OXYCODONE HCL 10 MG PO TABS
10.0000 mg | ORAL_TABLET | Freq: Four times a day (QID) | ORAL | 0 refills | Status: DC | PRN
  Filled 2025-01-21: qty 15, 4d supply, fill #0

## 2025-01-21 MED ORDER — PREGABALIN 300 MG PO CAPS: 300.0000 mg | ORAL_CAPSULE | Freq: Two times a day (BID) | ORAL | 0 refills | Status: AC

## 2025-01-21 MED ORDER — NICOTINE 21 MG/24HR TD PT24: 21.0000 mg | MEDICATED_PATCH | Freq: Every day | TRANSDERMAL | Status: AC

## 2025-01-21 MED ORDER — POTASSIUM CHLORIDE CRYS ER 20 MEQ PO TBCR
10.0000 meq | EXTENDED_RELEASE_TABLET | Freq: Every day | ORAL | Status: DC
  Administered 2025-01-21: 10 meq via ORAL
  Filled 2025-01-21: qty 1

## 2025-01-21 MED ORDER — FENTANYL 25 MCG/HR TD PT72
1.0000 | MEDICATED_PATCH | TRANSDERMAL | 0 refills | Status: DC
  Filled 2025-01-21: qty 2, 6d supply, fill #0

## 2025-01-21 MED ORDER — PREGABALIN 300 MG PO CAPS
300.0000 mg | ORAL_CAPSULE | Freq: Two times a day (BID) | ORAL | 0 refills | Status: DC
  Filled 2025-01-21: qty 14, 7d supply, fill #0

## 2025-01-21 NOTE — TOC Progression Note (Signed)
 Transition of Care [facility]) - Progression Note    Patient Details  Name: Philbert Ocallaghan MRN: [id number] Date of Birth: 08/01/1971  Transition of Care [facility]) CM/SW Contact  Alfonso Rummer, LCSW Phone Number: 01/21/2025, 11:27 AM  Clinical Narrative:     LCSW A Chastin Garlitz and Case Mgmt Ardia JULIANNA Bring spoke with [facility] admission director regarding Mr. LAVARIS SEXSON admission. Admission director reports they are full and unable to accept patient into their care. LCSW A Paislie Tessler proceed to locate alternate placement. Madelin Jacobus with [facility] reports they can accept patient. Insurance auth request was submitted 01/22/2024. TOC currently awaiting ins authorization.                    Expected Discharge Plan and Services         Expected Discharge Date: 01/21/25                                     Social Drivers of Health (SDOH) Interventions SDOH Screenings   Food Insecurity: No Food Insecurity (01/20/2025)  Housing: Low Risk (01/20/2025)  Transportation Needs: No Transportation Needs (01/20/2025)  Utilities: Not At Risk (01/20/2025)  Financial Resource Strain: Low Risk  (04/17/2024)   Received from [facility]  Tobacco Use: High Risk (01/18/2025)    Readmission Risk Interventions     No data to display

## 2025-01-21 NOTE — Progress Notes (Signed)
 Attempted to call report to [facility] [phone]) and spoke with receptionist. Was placed on hold for the accepting RN and was on hold for over 5 minutes. Was unable to give report, will call back. Rm [id number]

## 2025-01-21 NOTE — Progress Notes (Signed)
 2nd attempt to call report to [facility]. Was placed on hold for over 7 mins and was unable to speak with RN to give report

## 2025-01-21 NOTE — Progress Notes (Signed)
 Occupational Therapy Treatment Patient Details Name: Mark Wells MRN: [id number] DOB: 16-Jun-1971 Today's Date: 01/21/2025   History of present illness Pt admitted to [facility] on 01/18/25 for placement, recently signing out AMA. Significant PMH includes: CVA with left-sided hemiplegia, HTN, anemia, mood disorder, and chronic pain.   OT comments  Pt is supine in bed on arrival with NT present. Pleasant and agreeable to OT session. He did not c/o pain. Pt performed bed mobility with Min/CGA and increased time/effort for trunkal elevation. Pt required 2 attempts with cues for proper hand/feet placement and sequencing to safely squat pivot from bed to recliner with Mod A overall with posterior bias and requires RUE support on recliner arm and LUE supported by therapist. Pt demo ability to utilize RUE to feed self using utensils with LUE used as stabilizer to hold fruit cup. Pt left seated in recliner with all needs in place and will cont to require skilled acute OT services to maximize his safety and IND to return to PLOF.       If plan is discharge home, recommend the following:  A lot of help with walking and/or transfers;A lot of help with bathing/dressing/bathroom;Assistance with cooking/housework;Assist for transportation;Help with stairs or ramp for entrance   Equipment Recommendations  Other (comment) (defer to next venue)    Recommendations for Other Services      Precautions / Restrictions Precautions Precautions: Fall Recall of Precautions/Restrictions: Impaired Restrictions Weight Bearing Restrictions Per Provider Order: No       Mobility Bed Mobility Overal bed mobility: Needs Assistance Bed Mobility: Supine to Sit     Supine to sit: Min assist, HOB elevated     General bed mobility comments: increased time/effort    Transfers Overall transfer level: Needs assistance Equipment used: None Transfers: Bed to chair/wheelchair/BSC     Squat pivot transfers: Min assist,  Mod assist       General transfer comment: transferred towards R side from bed to recliner, held arm rest with RUE at all times, LUE supported by therapist as well as gait belt use to assist with lift off and safety during transfer     Balance Overall balance assessment: Needs assistance Sitting-balance support: No upper extremity supported, Feet supported Sitting balance-Leahy Scale: Good   Postural control: Posterior lean Standing balance support: Bilateral upper extremity supported, Single extremity supported Standing balance-Leahy Scale: Poor Standing balance comment: posterior bias on 1st standing attempt, required use of recliner arm to pull self up                           ADL either performed or assessed with clinical judgement   ADL Overall ADL's : Needs assistance/impaired Eating/Feeding: Supervision/ safety;Sitting Eating/Feeding Details (indicate cue type and reason): in recliner, able to mostly utilize RUE and manage use of fork to eat his fruit                     Toilet Transfer: Moderate assistance;Minimal assistance;Squat-pivot Statistician Details (indicate cue type and reason): LUE support and assist to stand and pivot to recliner from [employer]                Extremity/Trunk Assessment              Vision       Perception     Praxis     Communication Communication Communication: No apparent difficulties   Cognition Arousal: Alert Behavior During Therapy: [facility] for  tasks assessed/performed                                   Following commands impaired: Follows one step commands with increased time      Cueing      Exercises Other Exercises Other Exercises: edu on continued use of BUEs for functional tasks as safe/appropriate.    Shoulder Instructions       General Comments      Pertinent Vitals/ Pain       Pain Assessment Pain Assessment: No/denies pain Pain Intervention(s): Monitored during  session  Home Living                                          Prior Functioning/Environment              Frequency  Min 2X/week        Progress Toward Goals  OT Goals(current goals can now be found in the care plan section)  Progress towards OT goals: Progressing toward goals  Acute Rehab OT Goals Patient Stated Goal: get stronger OT Goal Formulation: With patient Time For Goal Achievement: 02/01/25 Potential to Achieve Goals: Fair  Plan      Co-evaluation                 AM-PAC OT 6 Clicks Daily Activity     Outcome Measure   Help from another person eating meals?: A Little Help from another person taking care of personal grooming?: A Little Help from another person toileting, which includes using toliet, bedpan, or urinal?: A Lot Help from another person bathing (including washing, rinsing, drying)?: A Lot Help from another person to put on and taking off regular upper body clothing?: A Little Help from another person to put on and taking off regular lower body clothing?: A Lot 6 Click Score: 15    End of Session Equipment Utilized During Treatment: Gait belt  OT Visit Diagnosis: Unsteadiness on feet (R26.81);Muscle weakness (generalized) (M62.81);Repeated falls (R29.6)   Activity Tolerance Patient tolerated treatment well   Patient Left with call bell/phone within reach;in chair;with chair alarm set   Nurse Communication Mobility status        Time: 9084-9066 OT Time Calculation (min): 18 min  Charges: OT General Charges $OT Visit: 1 Visit OT Treatments $Therapeutic Activity: 8-22 mins  Keelon Zurn Chrismon, OTR/L  01/21/25, 1:30 PM   Stacie Knutzen E Chrismon 01/21/2025, 1:20 PM

## 2025-01-21 NOTE — Plan of Care (Signed)
" °  Problem: Education: Goal: Knowledge of General Education information will improve Description: Including pain rating scale, medication(s)/side effects and non-pharmacologic comfort measures Outcome: Progressing   Problem: Clinical Measurements: Goal: Ability to maintain clinical measurements within normal limits will improve Outcome: Progressing Goal: Will remain free from infection Outcome: Progressing Goal: Respiratory complications will improve Outcome: Progressing   Problem: Nutrition: Goal: Adequate nutrition will be maintained Outcome: Progressing   Problem: Coping: Goal: Level of anxiety will decrease Outcome: Progressing   Problem: Skin Integrity: Goal: Risk for impaired skin integrity will decrease Outcome: Progressing   "

## 2025-01-21 NOTE — Care Management Obs Status (Signed)
 MEDICARE OBSERVATION STATUS NOTIFICATION   Patient Details  Name: Mark Wells MRN: [id number] Date of Birth: 1971-03-27   Medicare Observation Status Notification Given:  Yes    [name], CMA 01/21/2025, 1:38 PM

## 2025-01-21 NOTE — TOC Transition Note (Signed)
 Transition of Care [facility]) - Discharge Note   Patient Details  Name: Mark Wells MRN: [id number] Date of Birth: 02-28-1971  [facility] CM/SW Contact:  Alfonso Rummer, LCSW Phone Number: 01/21/2025, 3:51 PM   Clinical Narrative:     Pt will transition to [facility]hab not [facility] noted in attending MD note. Pt will transition via [facility]. Pt spouse contacted via phone and informed to patient transfer.   Final next level of care: Skilled Nursing Facility Barriers to Discharge: Barriers Resolved   Patient Goals and CMS Choice     Choice offered to / list presented to : Patient      Discharge Placement                Patient to be transferred to facility by: [facility] Name of family member notified: Maria Rattler Patient and family notified of of transfer: 01/21/25  Discharge Plan and Services Additional resources added to the After Visit Summary for                                       Social Drivers of Health (SDOH) Interventions SDOH Screenings   Food Insecurity: No Food Insecurity (01/20/2025)  Housing: Low Risk (01/20/2025)  Transportation Needs: No Transportation Needs (01/20/2025)  Utilities: Not At Risk (01/20/2025)  Financial Resource Strain: Low Risk  (04/17/2024)   Received from [facility]  Tobacco Use: High Risk (01/18/2025)     Readmission Risk Interventions     No data to display
